# Patient Record
Sex: Female | Born: 1950
Health system: Southern US, Community
[De-identification: ages and names within clinical notes are randomized; demographics above are authoritative.]

## PROBLEM LIST (undated history)

## (undated) DIAGNOSIS — G971 Other reaction to spinal and lumbar puncture: Secondary | ICD-10-CM

## (undated) DIAGNOSIS — A599 Trichomoniasis, unspecified: Secondary | ICD-10-CM

## (undated) DIAGNOSIS — N6320 Unspecified lump in the left breast, unspecified quadrant: Secondary | ICD-10-CM

## (undated) DIAGNOSIS — E079 Disorder of thyroid, unspecified: Secondary | ICD-10-CM

## (undated) DIAGNOSIS — Z87898 Personal history of other specified conditions: Secondary | ICD-10-CM

## (undated) DIAGNOSIS — I77810 Thoracic aortic ectasia: Secondary | ICD-10-CM

## (undated) DIAGNOSIS — Z8619 Personal history of other infectious and parasitic diseases: Secondary | ICD-10-CM

## (undated) DIAGNOSIS — R195 Other fecal abnormalities: Secondary | ICD-10-CM

## (undated) DIAGNOSIS — I319 Disease of pericardium, unspecified: Secondary | ICD-10-CM

## (undated) DIAGNOSIS — D649 Anemia, unspecified: Secondary | ICD-10-CM

## (undated) DIAGNOSIS — M2550 Pain in unspecified joint: Secondary | ICD-10-CM

## (undated) DIAGNOSIS — M48061 Spinal stenosis, lumbar region without neurogenic claudication: Secondary | ICD-10-CM

## (undated) DIAGNOSIS — M1990 Unspecified osteoarthritis, unspecified site: Secondary | ICD-10-CM

## (undated) DIAGNOSIS — K219 Gastro-esophageal reflux disease without esophagitis: Secondary | ICD-10-CM

## (undated) DIAGNOSIS — R42 Dizziness and giddiness: Secondary | ICD-10-CM

## (undated) DIAGNOSIS — J189 Pneumonia, unspecified organism: Secondary | ICD-10-CM

## (undated) DIAGNOSIS — R0602 Shortness of breath: Secondary | ICD-10-CM

## (undated) DIAGNOSIS — K5900 Constipation, unspecified: Secondary | ICD-10-CM

## (undated) DIAGNOSIS — E039 Hypothyroidism, unspecified: Secondary | ICD-10-CM

## (undated) DIAGNOSIS — T8859XA Other complications of anesthesia, initial encounter: Secondary | ICD-10-CM

## (undated) DIAGNOSIS — K649 Unspecified hemorrhoids: Secondary | ICD-10-CM

## (undated) DIAGNOSIS — G8929 Other chronic pain: Secondary | ICD-10-CM

## (undated) DIAGNOSIS — I700 Atherosclerosis of aorta: Secondary | ICD-10-CM

## (undated) DIAGNOSIS — F32A Depression, unspecified: Secondary | ICD-10-CM

## (undated) DIAGNOSIS — T4145XA Adverse effect of unspecified anesthetic, initial encounter: Secondary | ICD-10-CM

## (undated) DIAGNOSIS — Z8249 Family history of ischemic heart disease and other diseases of the circulatory system: Secondary | ICD-10-CM

## (undated) DIAGNOSIS — Z87440 Personal history of urinary (tract) infections: Secondary | ICD-10-CM

## (undated) DIAGNOSIS — J45909 Unspecified asthma, uncomplicated: Secondary | ICD-10-CM

## (undated) DIAGNOSIS — IMO0002 Reserved for concepts with insufficient information to code with codable children: Secondary | ICD-10-CM

## (undated) DIAGNOSIS — N951 Menopausal and female climacteric states: Secondary | ICD-10-CM

## (undated) DIAGNOSIS — K635 Polyp of colon: Secondary | ICD-10-CM

## (undated) DIAGNOSIS — N3941 Urge incontinence: Secondary | ICD-10-CM

## (undated) DIAGNOSIS — M549 Dorsalgia, unspecified: Secondary | ICD-10-CM

## (undated) DIAGNOSIS — K589 Irritable bowel syndrome without diarrhea: Secondary | ICD-10-CM

## (undated) HISTORY — DX: Unspecified lump in the left breast, unspecified quadrant: N63.20

## (undated) HISTORY — PX: DILATION AND CURETTAGE OF UTERUS: SHX78

## (undated) HISTORY — DX: Family history of ischemic heart disease and other diseases of the circulatory system: Z82.49

## (undated) HISTORY — DX: Trichomoniasis, unspecified: A59.9

## (undated) HISTORY — DX: Atherosclerosis of aorta: I70.0

## (undated) HISTORY — PX: FOOT SURGERY: SHX648

## (undated) HISTORY — DX: Constipation, unspecified: K59.00

## (undated) HISTORY — DX: Personal history of urinary (tract) infections: Z87.440

## (undated) HISTORY — DX: Morbid (severe) obesity due to excess calories: E66.01

## (undated) HISTORY — DX: Shortness of breath: R06.02

## (undated) HISTORY — DX: Pain in unspecified joint: M25.50

## (undated) HISTORY — DX: Personal history of other specified conditions: Z87.898

## (undated) HISTORY — PX: KNEE SURGERY: SHX244

## (undated) HISTORY — DX: Depression, unspecified: F32.A

## (undated) HISTORY — DX: Urge incontinence: N39.41

## (undated) HISTORY — DX: Other fecal abnormalities: R19.5

## (undated) HISTORY — PX: APPENDECTOMY: SHX54

## (undated) HISTORY — DX: Menopausal and female climacteric states: N95.1

## (undated) HISTORY — PX: REDUCTION MAMMAPLASTY: SUR839

## (undated) HISTORY — PX: HAND SURGERY: SHX662

## (undated) HISTORY — DX: Disorder of thyroid, unspecified: E07.9

## (undated) HISTORY — PX: BLADDER SUSPENSION: SHX72

## (undated) HISTORY — DX: Dorsalgia, unspecified: M54.9

## (undated) HISTORY — DX: Personal history of other infectious and parasitic diseases: Z86.19

## (undated) HISTORY — PX: TONSILLECTOMY: SUR1361

## (undated) HISTORY — DX: Thoracic aortic ectasia: I77.810

---

## 1990-02-18 HISTORY — PX: ABDOMINAL HYSTERECTOMY: SHX81

## 1990-11-20 HISTORY — PX: HERNIA REPAIR: SHX51

## 1990-11-20 HISTORY — PX: BREAST REDUCTION SURGERY: SHX8

## 1998-03-03 ENCOUNTER — Ambulatory Visit (HOSPITAL_COMMUNITY): Admission: RE | Admit: 1998-03-03 | Discharge: 1998-03-03 | Payer: Self-pay

## 1998-12-23 ENCOUNTER — Other Ambulatory Visit: Admission: RE | Admit: 1998-12-23 | Discharge: 1998-12-23 | Payer: Self-pay | Admitting: Obstetrics and Gynecology

## 1999-01-24 ENCOUNTER — Emergency Department (HOSPITAL_COMMUNITY): Admission: EM | Admit: 1999-01-24 | Discharge: 1999-01-25 | Payer: Self-pay | Admitting: Emergency Medicine

## 1999-04-05 ENCOUNTER — Emergency Department (HOSPITAL_COMMUNITY): Admission: EM | Admit: 1999-04-05 | Discharge: 1999-04-05 | Payer: Self-pay | Admitting: Emergency Medicine

## 1999-04-05 ENCOUNTER — Encounter: Payer: Self-pay | Admitting: Emergency Medicine

## 1999-12-27 ENCOUNTER — Encounter: Payer: Self-pay | Admitting: Family Medicine

## 1999-12-27 ENCOUNTER — Ambulatory Visit (HOSPITAL_COMMUNITY): Admission: RE | Admit: 1999-12-27 | Discharge: 1999-12-27 | Payer: Self-pay | Admitting: Family Medicine

## 2000-02-22 ENCOUNTER — Emergency Department (HOSPITAL_COMMUNITY): Admission: EM | Admit: 2000-02-22 | Discharge: 2000-02-22 | Payer: Self-pay | Admitting: Emergency Medicine

## 2000-02-22 ENCOUNTER — Encounter: Payer: Self-pay | Admitting: Emergency Medicine

## 2001-03-22 ENCOUNTER — Emergency Department (HOSPITAL_COMMUNITY): Admission: EM | Admit: 2001-03-22 | Discharge: 2001-03-23 | Payer: Self-pay | Admitting: *Deleted

## 2001-07-17 ENCOUNTER — Ambulatory Visit (HOSPITAL_COMMUNITY): Admission: RE | Admit: 2001-07-17 | Discharge: 2001-07-17 | Payer: Self-pay | Admitting: Specialist

## 2001-11-20 DIAGNOSIS — N6320 Unspecified lump in the left breast, unspecified quadrant: Secondary | ICD-10-CM

## 2001-11-20 DIAGNOSIS — N951 Menopausal and female climacteric states: Secondary | ICD-10-CM

## 2001-11-20 DIAGNOSIS — N63 Unspecified lump in unspecified breast: Secondary | ICD-10-CM | POA: Insufficient documentation

## 2001-11-20 HISTORY — PX: BREAST LUMPECTOMY: SHX2

## 2001-11-20 HISTORY — PX: INTERSTIM IMPLANT PLACEMENT: SHX5130

## 2001-11-20 HISTORY — DX: Unspecified lump in the left breast, unspecified quadrant: N63.20

## 2001-11-20 HISTORY — DX: Menopausal and female climacteric states: N95.1

## 2002-05-15 ENCOUNTER — Other Ambulatory Visit: Admission: RE | Admit: 2002-05-15 | Discharge: 2002-05-15 | Payer: Self-pay | Admitting: Obstetrics and Gynecology

## 2003-05-07 ENCOUNTER — Other Ambulatory Visit: Admission: RE | Admit: 2003-05-07 | Discharge: 2003-05-07 | Payer: Self-pay | Admitting: Obstetrics and Gynecology

## 2003-11-21 DIAGNOSIS — N3941 Urge incontinence: Secondary | ICD-10-CM

## 2003-11-21 HISTORY — DX: Urge incontinence: N39.41

## 2003-11-26 ENCOUNTER — Inpatient Hospital Stay (HOSPITAL_COMMUNITY): Admission: EM | Admit: 2003-11-26 | Discharge: 2003-11-28 | Payer: Self-pay | Admitting: Emergency Medicine

## 2003-11-27 ENCOUNTER — Encounter (INDEPENDENT_AMBULATORY_CARE_PROVIDER_SITE_OTHER): Payer: Self-pay | Admitting: Cardiology

## 2004-11-20 HISTORY — DX: Morbid (severe) obesity due to excess calories: E66.01

## 2005-03-13 ENCOUNTER — Emergency Department (HOSPITAL_COMMUNITY): Admission: EM | Admit: 2005-03-13 | Discharge: 2005-03-14 | Payer: Self-pay | Admitting: Emergency Medicine

## 2005-07-05 ENCOUNTER — Other Ambulatory Visit: Admission: RE | Admit: 2005-07-05 | Discharge: 2005-07-05 | Payer: Self-pay | Admitting: Obstetrics and Gynecology

## 2005-07-20 ENCOUNTER — Ambulatory Visit (HOSPITAL_COMMUNITY): Admission: RE | Admit: 2005-07-20 | Discharge: 2005-07-20 | Payer: Self-pay | Admitting: Obstetrics and Gynecology

## 2005-09-02 ENCOUNTER — Ambulatory Visit: Payer: Self-pay | Admitting: Sports Medicine

## 2005-09-02 ENCOUNTER — Observation Stay (HOSPITAL_COMMUNITY): Admission: EM | Admit: 2005-09-02 | Discharge: 2005-09-02 | Payer: Self-pay | Admitting: Emergency Medicine

## 2006-01-15 ENCOUNTER — Emergency Department (HOSPITAL_COMMUNITY): Admission: EM | Admit: 2006-01-15 | Discharge: 2006-01-16 | Payer: Self-pay | Admitting: Emergency Medicine

## 2006-11-20 HISTORY — PX: ROTATOR CUFF REPAIR: SHX139

## 2007-01-26 ENCOUNTER — Emergency Department (HOSPITAL_COMMUNITY): Admission: EM | Admit: 2007-01-26 | Discharge: 2007-01-26 | Payer: Self-pay | Admitting: Emergency Medicine

## 2007-04-30 ENCOUNTER — Emergency Department (HOSPITAL_COMMUNITY): Admission: EM | Admit: 2007-04-30 | Discharge: 2007-04-30 | Payer: Self-pay | Admitting: Emergency Medicine

## 2008-01-10 ENCOUNTER — Ambulatory Visit (HOSPITAL_COMMUNITY): Admission: RE | Admit: 2008-01-10 | Discharge: 2008-01-10 | Payer: Self-pay | Admitting: Specialist

## 2008-07-04 ENCOUNTER — Emergency Department (HOSPITAL_COMMUNITY): Admission: EM | Admit: 2008-07-04 | Discharge: 2008-07-04 | Payer: Self-pay | Admitting: Emergency Medicine

## 2008-11-20 HISTORY — PX: CHOLECYSTECTOMY: SHX55

## 2009-03-29 ENCOUNTER — Emergency Department (HOSPITAL_COMMUNITY): Admission: EM | Admit: 2009-03-29 | Discharge: 2009-03-29 | Payer: Self-pay | Admitting: Emergency Medicine

## 2009-05-26 ENCOUNTER — Encounter: Admission: RE | Admit: 2009-05-26 | Discharge: 2009-05-26 | Payer: Self-pay | Admitting: Orthopaedic Surgery

## 2009-08-05 ENCOUNTER — Emergency Department (HOSPITAL_COMMUNITY): Admission: EM | Admit: 2009-08-05 | Discharge: 2009-08-06 | Payer: Self-pay | Admitting: Emergency Medicine

## 2009-10-17 ENCOUNTER — Emergency Department (HOSPITAL_COMMUNITY): Admission: EM | Admit: 2009-10-17 | Discharge: 2009-10-17 | Payer: Self-pay | Admitting: Emergency Medicine

## 2009-11-08 ENCOUNTER — Emergency Department (HOSPITAL_COMMUNITY): Admission: EM | Admit: 2009-11-08 | Discharge: 2009-11-08 | Payer: Self-pay | Admitting: Family Medicine

## 2010-03-16 ENCOUNTER — Inpatient Hospital Stay (HOSPITAL_COMMUNITY): Admission: EM | Admit: 2010-03-16 | Discharge: 2010-03-18 | Payer: Self-pay | Admitting: Emergency Medicine

## 2010-05-24 ENCOUNTER — Encounter: Admission: RE | Admit: 2010-05-24 | Discharge: 2010-05-24 | Payer: Self-pay | Admitting: Obstetrics and Gynecology

## 2010-12-15 ENCOUNTER — Encounter
Admission: RE | Admit: 2010-12-15 | Discharge: 2010-12-15 | Payer: Self-pay | Source: Home / Self Care | Attending: Obstetrics and Gynecology | Admitting: Obstetrics and Gynecology

## 2011-01-09 ENCOUNTER — Encounter: Payer: BC Managed Care – PPO | Attending: Endocrinology | Admitting: *Deleted

## 2011-01-09 DIAGNOSIS — E119 Type 2 diabetes mellitus without complications: Secondary | ICD-10-CM | POA: Insufficient documentation

## 2011-01-09 DIAGNOSIS — Z713 Dietary counseling and surveillance: Secondary | ICD-10-CM | POA: Insufficient documentation

## 2011-01-27 ENCOUNTER — Emergency Department (HOSPITAL_COMMUNITY)
Admission: EM | Admit: 2011-01-27 | Discharge: 2011-01-27 | Disposition: A | Discharge status: Home / Self Care | Payer: BC Managed Care – PPO | Attending: Emergency Medicine | Admitting: Emergency Medicine

## 2011-01-27 ENCOUNTER — Emergency Department (HOSPITAL_COMMUNITY): Payer: BC Managed Care – PPO

## 2011-01-27 DIAGNOSIS — M545 Low back pain, unspecified: Secondary | ICD-10-CM | POA: Insufficient documentation

## 2011-01-27 DIAGNOSIS — W010XXA Fall on same level from slipping, tripping and stumbling without subsequent striking against object, initial encounter: Secondary | ICD-10-CM | POA: Insufficient documentation

## 2011-01-27 DIAGNOSIS — M549 Dorsalgia, unspecified: Secondary | ICD-10-CM | POA: Insufficient documentation

## 2011-02-07 ENCOUNTER — Encounter: Payer: BC Managed Care – PPO | Attending: Endocrinology | Admitting: *Deleted

## 2011-02-07 DIAGNOSIS — Z713 Dietary counseling and surveillance: Secondary | ICD-10-CM | POA: Insufficient documentation

## 2011-02-07 DIAGNOSIS — E119 Type 2 diabetes mellitus without complications: Secondary | ICD-10-CM | POA: Insufficient documentation

## 2011-02-07 LAB — URINALYSIS, MICROSCOPIC ONLY
Hgb urine dipstick: NEGATIVE
Leukocytes, UA: NEGATIVE
Nitrite: NEGATIVE
Protein, ur: NEGATIVE mg/dL
Specific Gravity, Urine: 1.027 (ref 1.005–1.030)
Urobilinogen, UA: 0.2 mg/dL (ref 0.0–1.0)

## 2011-02-07 LAB — CULTURE, BLOOD (ROUTINE X 2)
Culture: NO GROWTH
Culture: NO GROWTH

## 2011-02-07 LAB — COMPREHENSIVE METABOLIC PANEL
ALT: 38 U/L — ABNORMAL HIGH (ref 0–35)
AST: 24 U/L (ref 0–37)
Albumin: 3 g/dL — ABNORMAL LOW (ref 3.5–5.2)
Alkaline Phosphatase: 93 U/L (ref 39–117)
BUN: 15 mg/dL (ref 6–23)
CO2: 29 mEq/L (ref 19–32)
Calcium: 8.1 mg/dL — ABNORMAL LOW (ref 8.4–10.5)
Chloride: 106 mEq/L (ref 96–112)
Creatinine, Ser: 0.68 mg/dL (ref 0.4–1.2)
GFR calc Af Amer: >60 mL/min (ref >60)
GFR calc non Af Amer: >60 mL/min (ref >60)
Glucose, Bld: 108 mg/dL — ABNORMAL HIGH (ref 70–99)
Potassium: 3.9 mEq/L (ref 3.5–5.1)
Sodium: 138 mEq/L (ref 135–145)
Total Bilirubin: 0.6 mg/dL (ref 0.3–1.2)
Total Protein: 6 g/dL (ref 6.0–8.3)

## 2011-02-07 LAB — URINE CULTURE: Colony Count: 40000

## 2011-02-07 LAB — BASIC METABOLIC PANEL
BUN: 12 mg/dL (ref 6–23)
BUN: 16 mg/dL (ref 6–23)
CO2: 27 mEq/L (ref 19–32)
CO2: 28 mEq/L (ref 19–32)
Calcium: 8.4 mg/dL (ref 8.4–10.5)
Calcium: 8.9 mg/dL (ref 8.4–10.5)
Chloride: 104 mEq/L (ref 96–112)
Chloride: 105 mEq/L (ref 96–112)
Creatinine, Ser: 0.63 mg/dL (ref 0.4–1.2)
Creatinine, Ser: 0.78 mg/dL (ref 0.4–1.2)
GFR calc Af Amer: >60 mL/min (ref >60)
GFR calc non Af Amer: >60 mL/min (ref >60)
Glucose, Bld: 112 mg/dL — ABNORMAL HIGH (ref 70–99)
Glucose, Bld: 131 mg/dL — ABNORMAL HIGH (ref 70–99)
Potassium: 3.5 mEq/L (ref 3.5–5.1)
Sodium: 140 mEq/L (ref 135–145)

## 2011-02-07 LAB — LIPID PANEL
HDL: 41 mg/dL (ref >39)
LDL Cholesterol: 78 mg/dL (ref 0–99)
Triglycerides: 144 mg/dL (ref <150)
VLDL: 29 mg/dL (ref 0–40)

## 2011-02-07 LAB — POCT CARDIAC MARKERS
CKMB, poc: 1.7 ng/mL (ref 1.0–8.0)
Myoglobin, poc: 65.6 ng/mL (ref 12–200)
Troponin i, poc: <0.05 ng/mL (ref 0.00–0.09)

## 2011-02-07 LAB — CBC
HCT: 33.7 % — ABNORMAL LOW (ref 36.0–46.0)
HCT: 38.5 % (ref 36.0–46.0)
Hemoglobin: 11.3 g/dL — ABNORMAL LOW (ref 12.0–15.0)
Hemoglobin: 13 g/dL (ref 12.0–15.0)
MCHC: 33.4 g/dL (ref 30.0–36.0)
MCHC: 33.8 g/dL (ref 30.0–36.0)
MCV: 85.8 fL (ref 78.0–100.0)
MCV: 86.7 fL (ref 78.0–100.0)
Platelets: 200 10*3/uL (ref 150–400)
Platelets: 261 10*3/uL (ref 150–400)
RBC: 3.89 MIL/uL (ref 3.87–5.11)
RBC: 4.48 MIL/uL (ref 3.87–5.11)
RDW: 14.9 % (ref 11.5–15.5)
RDW: 15 % (ref 11.5–15.5)
WBC: 11.7 10*3/uL — ABNORMAL HIGH (ref 4.0–10.5)
WBC: 9.1 10*3/uL (ref 4.0–10.5)

## 2011-02-07 LAB — SEDIMENTATION RATE: Sed Rate: 33 mm/hr — ABNORMAL HIGH (ref 0–22)

## 2011-02-07 LAB — CK TOTAL AND CKMB (NOT AT ARMC)
CK, MB: 1.4 ng/mL (ref 0.3–4.0)
Relative Index: INVALID (ref 0.0–2.5)
Total CK: 50 U/L (ref 7–177)

## 2011-02-07 LAB — D-DIMER, QUANTITATIVE: D-Dimer, Quant: 0.58 ug/mL-FEU — ABNORMAL HIGH (ref 0.00–0.48)

## 2011-02-07 LAB — CARDIAC PANEL(CRET KIN+CKTOT+MB+TROPI)
CK, MB: 1.1 ng/mL (ref 0.3–4.0)
Relative Index: INVALID (ref 0.0–2.5)
Total CK: 39 U/L (ref 7–177)
Total CK: 45 U/L (ref 7–177)
Troponin I: 0.02 ng/mL (ref 0.00–0.06)

## 2011-02-07 LAB — TROPONIN I: Troponin I: <0.01 ng/mL (ref 0.00–0.06)

## 2011-02-07 LAB — TSH: TSH: 1.698 u[IU]/mL (ref 0.350–4.500)

## 2011-02-07 LAB — HEMOGLOBIN A1C
Hgb A1c MFr Bld: 6.1 % — ABNORMAL HIGH (ref <5.7)
Mean Plasma Glucose: 128 mg/dL — ABNORMAL HIGH (ref <117)

## 2011-03-14 ENCOUNTER — Encounter: Payer: BC Managed Care – PPO | Attending: Endocrinology | Admitting: *Deleted

## 2011-03-14 DIAGNOSIS — Z713 Dietary counseling and surveillance: Secondary | ICD-10-CM | POA: Insufficient documentation

## 2011-03-14 DIAGNOSIS — E119 Type 2 diabetes mellitus without complications: Secondary | ICD-10-CM | POA: Insufficient documentation

## 2011-04-07 NOTE — H&P (Signed)
NAMEMarland Kitchen  Kelly Taylor, Kelly Taylor                 ACCOUNT NO.:  [id number]   MEDICAL RECORD NO.:  [id number]          PATIENT TYPE:  INP   LOCATION:  2031                         FACILITY:  [facility]   PHYSICIAN:  Kelly Taylor, Kelly TaylorDATE OF BIRTH:  Mar 17, 1951   DATE OF ADMISSION:  09/01/2005  DATE OF DISCHARGE:                                HISTORY & PHYSICAL   CHIEF COMPLAINT:  Chest pain.   HISTORY OF PRESENT ILLNESS:  The patient is a 60 year old female  ____________ substernal chest pain.  The patient states the pain started  shortly after dinner the evening prior to admission.  Dinner consisted of  pizza and ___________.  She had a sharp substernal chest pain that radiated  to the left arm.  The chest pain was associated with diaphoresis and  shortness of breath.  The pain is rated as a 7 on a scale of 1 to 10.  ___________.  The patient denies any ___________ no GI or urinary  complaints.  The patient does have hot flashes frequently, ___________.   PAST MEDICAL HISTORY:  ___________ stress incontinence.  No history of  ___________.   ALLERGIES:  ____________.   MEDICATIONS:  1.  ___________ p.o. every day.  2.  Imipramine ___________ b.i.d.  3.  Enjubia 0.625 __________ every day.   FAMILY HISTORY:  Mother hypercholesterolemia, CABG, lung cancer, __________.  Father suicide.   SOCIAL HISTORY:  Married, children.  Employed at [employer], part time.  Usually  drinks on average one alcoholic beverage every three months.  No tobacco  history or drug use.  Patient ___________.   PHYSICAL EXAMINATION:  VITAL SIGNS:  Height __________, pulse 104,  respiratory rate 20, blood pressure 136/90, O2 saturation __________.  GENERAL:  ___________.  HEENT:  Atraumatic, normocephalic.  PERRL.  EOMI.  TMs clear.  Pharynx  __________.  NECK:  Full range of motion without stiffness.  No thyromegaly.  No  lymphadenopathy.  CARDIOVASCULAR:  Regular rate and rhythm with no gallops or rubs or JVD.  LUNGS:   Clear to auscultation bilaterally.  ABDOMEN:  Obese, soft, nontender, nondistended.  No hepatosplenomegaly or  masses.  Positive bowel sounds.  EXTREMITIES:  Trace lower extremity edema bilaterally.  No erythema.  Muscle  strength 5/5 bilaterally upper and lower extremities.  NEUROLOGIC:  Cranial nerves II-XII are intact.   LABORATORY:  WBC 11.0, hemoglobin 12.3, hematocrit 37.3, platelets 280.  Sodium 142, potassium 3.2, chloride 105, bicarb 25, BUN 12, creatinine 0.7.  AST 10, ALT 10, alk phos 75, bilirubin 0.3.  ___________.  Cardiac enzymes  at 2359, CK-MB less than 1.0, troponin I less than 0.05, myoglobin 46.5.  See cardiac enzymes at 0111, CK-MB 1.2, troponin I 0.05, myoglobin 58.9.   Chest x-ray:  No acute findings.   ASSESSMENT/PLAN:  A 60 year old obese female with acute chest pain.   1.  Chest pain.  Other than obesity, the patient has low risk factors for      cardiac disease __________.  No abnormalities noted on EKG.  For now, we      will continue supportive treatment and  monitor for recurrence of pain.      The patient continues to be pain free after nitroglycerin drip has been      discontinued.  Etiology of the chest pain may be secondary to      gastroesophageal reflux versus esophageal spasm.  Start Protonix every      day.  Pulmonary embolus very unlikely given normal CT of abdomen, chest      x-ray not significant for pneumonia or edema.  We will order a lipid      panel to help with risk stratification as well as TSH to rule out      thyroid disease as an etiology of chest pain.  2.  Musculoskeletal.  May continue p.r.n. medications.  The patient only      taking once daily dose, however, will consider discharging patient on a      ___________ block or proton pump inhibitor to prevent insult to      gastrointestinal.  3.  Fluids, electrolytes, nutrition gastrointestinal.  Patient well      hydrated.  Hep-Lock intravenous fluids, start regular diet, replace       potassium orally, monitor in and out.   DISPOSITION:  Pending cardiac panel negative x third set.  Vital signs  stable as well as chest pain continues to be resolved.  The patient is to  return to urgent care for hospital followup within the next one week.  We  will notify them of this admission.      Morley Kos, M.D.    ______________________________  Kelly Bumpers. Leveda Anna, M.D.    VRE/MEDQ  D:  09/02/2005  T:  09/02/2005  Job:  [id number]   cc:   [facility]

## 2011-04-07 NOTE — Discharge Summary (Signed)
Kelly Taylor, Kelly Taylor                 ACCOUNT NO.:  [id number]   MEDICAL RECORD NO.:  [id number]          PATIENT TYPE:  INP   LOCATION:  2031                         FACILITY:  [facility]   PHYSICIAN:  Franchot Mimes, MD      DATE OF BIRTH:  1951-09-14   DATE OF ADMISSION:  09/01/2005  DATE OF DISCHARGE:  09/02/2005                                 DISCHARGE SUMMARY   DISCHARGE DIAGNOSES:  1.  Chest pain without acute myocardial infarction.  2.  Likely gastritis.  3.  Glucose intolerance.   DISCHARGE MEDICATIONS:  1.  __________ 0.625 mg daily.  2.  Imipramine 25 mg twice daily.  3.  Mobic 15 mg daily.  4.  Zantac or Prilosec OTC as needed.   DISCHARGE INSTRUCTIONS:  1.  The patient was instructed to contact [facility] for followup      appointment within two weeks after discharge for follow-up and possible      workup of her chest pain.  2.  The patient was instructed if her pain persisted or returned then she      should contact her doctor or return to the emergency room.  3.  The patient was instructed to limit her use of Mobic to avoid stomach      and esophageal irritation.   LABORATORY DATA:  CBC -- white blood cell count 11, hemoglobin 12.3,  hematocrit 37.3, platelets 280.  CMET -- sodium 137, potassium 4.1, chloride  105, bicarb 24, BUN 12, creatinine 0.8.   A fasting plasma glucose was 124, total bilirubin 0.3, alkaline phosphatase  74, AST 19, ALT 14, total protein 6.5, albumin 3.4, calcium 9.0.  Cardiac  enzymes were negative x3 sets.   Fasting lipid profile -- total cholesterol 165, triglycerides 159, HDL 52,  LDL 81.   A urinalysis was normal.  Alcohol and lipase were also within normal limits   CONSULTS:  None.   PROCEDURES:  1.  An EKG was normal except for some low voltage QRS.  2.  Chest x-ray was normal.  3.  A chest CT was performed which revealed small gallstones with no      evidence of cholecystitis.   HISTORY OF PRESENT ILLNESS:  The  patient is a 60 year old postmenopausal  obese female who says that she was having pizza as well as alcohol and began  having substernal chest pain radiating to her left.  The pain was not  relieved with nitroglycerin.  The patient has not been having chest pain  prior.  She does occasionally have hot flashes, she started hormone therapy  two months ago.   PHYSICAL EXAMINATION:  VITAL SIGNS:  Temperature 98.5, heart rate 104, blood  pressure 136/90, respirations 20, oxygen saturation 95% on room air.  GENERAL:  She is awake, alert, oriented x3 in no distress.  HEENT:  Exam was normal.  CARDIOVASCULAR:  Exam normal.  LUNGS:  Exam norma.  ABDOMEN:  Exam showed no masses and no tenderness.   HOSPITAL COURSE:  1.  Chest pain:  The pain eventually relieved overnight, however, it  was not      associated with any particular treatment or intervention.  She was      placed on morphine for pain and this may have helped the pain subsided,      however, she was pain free without receiving morphine well into the next      day after admission.  She was also given Protonix as well.  She was also      started on Lopressor and aspirin for her chest pain.  It was felt given      that she had minimal risk factors consisting primarily of a family      history with her mom diagnosed in her 45s, and that she was recently      started on hormone replacement therapy, and that she is obese, that the      patient could be worked up as an outpatient if she continues to have      symptoms concerning for coronary artery disease.  2.  Likely gastritis.  Given that she takes Mobic and apparently drinks      alcohol, it is possible that this pain was also related to gastritis or      esophageal spasm.  She can use Prilosec OTC or Zantac for these      symptoms, if the symptoms continue to persist then a GI workup may be      indicated.  3.  Gallstones without cholecystitis.   SUGGESTED FOLLOWUP ITEMS:  1.  For  the chest pain, further workup may be indicated as an outpatient.  2.  Given her gallstones, if this becomes an issue or she becomes      symptomatic from these, a cholecystectomy may be an option.  3.  Risks and benefits of continued hormone replacement therapy should be      discussed if she is found to be at significant risk for coronary artery      disease.  4.  Risks and benefits of continued Mobic or nonsteroidal anti-inflammatory      use should be discussed given possibility of gastritis.      Franchot Mimes, MD     TV/MEDQ  D:  09/02/2005  T:  09/02/2005  Job:  [id number]   cc:   [facility]

## 2011-04-07 NOTE — Discharge Summary (Signed)
NAMEMarland Kitchen  Kelly Taylor, Kelly Taylor [facility]                     ACCOUNT NO.:  [id number]   MEDICAL RECORD NO.:  [id number]                   PATIENT TYPE:  INP   LOCATION:  4743                                 FACILITY:  [facility]   PHYSICIAN:  Jenne Campus, Dr.                        DATE OF BIRTH:  December 02, 1950   DATE OF ADMISSION:  11/26/2003  DATE OF DISCHARGE:  11/28/2003                                 DISCHARGE SUMMARY   DISCHARGE DIAGNOSES:  1. Chest pain, myocardial infarction ruled out.  2. Family history of coronary disease.   HOSPITAL COURSE:  The patient is a 60 year old female with a strong family  history of coronary disease.  Her mother had bypass surgery at 33.  She was  admitted through the emergency room at 8 p.m. on the 6th with chest  pressure, consistent with unstable angina.  She described a chest pressure  with left arm pain, neck and jaw pain.  She also said she was short of  breath, and had nausea, and emesis.  She was brought to the ER by EMS.  The  symptoms were improved after Pepcid, Benadryl, morphine and nitroglycerin.   PAST MEDICAL HISTORY:  Unremarkable except arthritis.   MEDICATIONS:  She takes no medications at home.   ALLERGIES:  Has no allergies.   FAMILY HISTORY:  She does have a family history of coronary disease as  noted.  She is a nonsmoker.   She was admitted to telemetry.  CT was obtained which was negative for  pulmonary embolism.  She did have a slightly elevated temperature to 99.1 on  the 7th.  Blood cultures were obtained and the patient was started on  Avelox.  An echocardiogram was ordered and the results are pending at the  time of this dictation but can be followed up as an outpatient.  She has  apparently been seen in our office in the past and had a Cardiolite study in  December 2003 that was negative for ischemia.  We feel she can be discharged  on November 28, 2003.  Dr. Aleen Campi suggests we get a followup calcium level,  as her calcium was 7.6.   She will also have a phosphorous level checked.  We  will need to followup her echocardiogram and blood cultures as well,  although she is afebrile at discharge.  She will be sent home on a few days  of antibiotics and a nonsteroidal on a p.r.n. basis.   DISPOSITION:  The patient is discharged in stable condition and will follow  up with our office in about two weeks.      Abelino Derrick, P.Rayburn Ma, Dr.    Lenard Lance  D:  11/28/2003  T:  11/28/2003  Job:  [id number]

## 2011-04-21 ENCOUNTER — Emergency Department (HOSPITAL_COMMUNITY)
Admission: EM | Admit: 2011-04-21 | Discharge: 2011-04-21 | Disposition: A | Discharge status: Home / Self Care | Payer: BC Managed Care – PPO | Attending: Emergency Medicine | Admitting: Emergency Medicine

## 2011-04-21 DIAGNOSIS — R197 Diarrhea, unspecified: Secondary | ICD-10-CM | POA: Insufficient documentation

## 2011-04-21 DIAGNOSIS — E119 Type 2 diabetes mellitus without complications: Secondary | ICD-10-CM | POA: Insufficient documentation

## 2011-04-21 DIAGNOSIS — R42 Dizziness and giddiness: Secondary | ICD-10-CM | POA: Insufficient documentation

## 2011-04-21 DIAGNOSIS — H538 Other visual disturbances: Secondary | ICD-10-CM | POA: Insufficient documentation

## 2011-05-17 ENCOUNTER — Other Ambulatory Visit: Payer: Self-pay | Admitting: Obstetrics and Gynecology

## 2011-05-17 DIAGNOSIS — N63 Unspecified lump in unspecified breast: Secondary | ICD-10-CM

## 2011-05-26 ENCOUNTER — Ambulatory Visit
Admission: RE | Admit: 2011-05-26 | Discharge: 2011-05-26 | Disposition: A | Discharge status: Home / Self Care | Payer: BC Managed Care – PPO | Source: Ambulatory Visit | Attending: Obstetrics and Gynecology | Admitting: Obstetrics and Gynecology

## 2011-05-26 DIAGNOSIS — N63 Unspecified lump in unspecified breast: Secondary | ICD-10-CM

## 2011-09-07 LAB — POCT CARDIAC MARKERS
CKMB, poc: <1 — ABNORMAL LOW
Myoglobin, poc: 38.1
Operator id: 277751
Troponin i, poc: <0.05
Troponin i, poc: <0.05

## 2011-09-07 LAB — I-STAT 8, (EC8 V) (CONVERTED LAB)
Bicarbonate: 23.5
Chloride: 109
HCT: 36
Hemoglobin: 12.2
Operator id: 277751
Sodium: 139
pCO2, Ven: 35.4 — ABNORMAL LOW

## 2011-09-07 LAB — POCT I-STAT CREATININE: Creatinine, Ser: 0.6

## 2011-12-22 ENCOUNTER — Encounter (HOSPITAL_COMMUNITY): Payer: Self-pay | Admitting: Emergency Medicine

## 2011-12-22 ENCOUNTER — Emergency Department (HOSPITAL_COMMUNITY)
Admission: EM | Admit: 2011-12-22 | Discharge: 2011-12-23 | Disposition: A | Discharge status: Home / Self Care | Payer: BC Managed Care – PPO | Attending: Emergency Medicine | Admitting: Emergency Medicine

## 2011-12-22 ENCOUNTER — Emergency Department (HOSPITAL_COMMUNITY): Payer: BC Managed Care – PPO

## 2011-12-22 DIAGNOSIS — Z79899 Other long term (current) drug therapy: Secondary | ICD-10-CM | POA: Insufficient documentation

## 2011-12-22 DIAGNOSIS — X500XXA Overexertion from strenuous movement or load, initial encounter: Secondary | ICD-10-CM | POA: Insufficient documentation

## 2011-12-22 DIAGNOSIS — M25519 Pain in unspecified shoulder: Secondary | ICD-10-CM | POA: Insufficient documentation

## 2011-12-22 DIAGNOSIS — E119 Type 2 diabetes mellitus without complications: Secondary | ICD-10-CM | POA: Insufficient documentation

## 2011-12-22 DIAGNOSIS — M129 Arthropathy, unspecified: Secondary | ICD-10-CM | POA: Insufficient documentation

## 2011-12-22 DIAGNOSIS — S46909A Unspecified injury of unspecified muscle, fascia and tendon at shoulder and upper arm level, unspecified arm, initial encounter: Secondary | ICD-10-CM | POA: Insufficient documentation

## 2011-12-22 DIAGNOSIS — Z9889 Other specified postprocedural states: Secondary | ICD-10-CM | POA: Insufficient documentation

## 2011-12-22 DIAGNOSIS — S4992XA Unspecified injury of left shoulder and upper arm, initial encounter: Secondary | ICD-10-CM

## 2011-12-22 DIAGNOSIS — M79609 Pain in unspecified limb: Secondary | ICD-10-CM | POA: Insufficient documentation

## 2011-12-22 DIAGNOSIS — S4980XA Other specified injuries of shoulder and upper arm, unspecified arm, initial encounter: Secondary | ICD-10-CM | POA: Insufficient documentation

## 2011-12-22 DIAGNOSIS — Y990 Civilian activity done for income or pay: Secondary | ICD-10-CM | POA: Insufficient documentation

## 2011-12-22 HISTORY — DX: Unspecified osteoarthritis, unspecified site: M19.90

## 2011-12-22 MED ORDER — OXYCODONE-ACETAMINOPHEN 5-325 MG PO TABS
2.0000 | ORAL_TABLET | Freq: Once | ORAL | Status: AC
  Administered 2011-12-22: 2 via ORAL
  Filled 2011-12-22: qty 2

## 2011-12-22 NOTE — ED Notes (Signed)
Pt st's she fell at work tonight and put her left arm down to break her fall.  Pt c/o pain to left upper arm.

## 2011-12-22 NOTE — ED Notes (Signed)
Pt & stretcher not in room, in xray.

## 2011-12-22 NOTE — ED Notes (Signed)
Back from xray, alert, NAD, calm, interactive, guarding movements, ice pack resting on L anterior shoulder, CMS intact bilaterally, bilateral radial pulses (strong) & cap refill (<3sec) equal. Reports some n/t in L arm, states, "cannot have MRI d/t medical implant".

## 2011-12-23 NOTE — ED Provider Notes (Signed)
History     CSN: [id number]  Arrival date & time 12/22/11  2104   First MD Initiated Contact with Patient 12/22/11 2300      Chief Complaint  Patient presents with  . Arm Injury    (Consider location/radiation/quality/duration/timing/severity/associated sxs/prior treatment) HPI Comments: Patient tripped at work, catching herself with her outstretched left arm.  Now having pain in the left shoulder joint, radiating to mid humerus area.  Full range of motion of wrist and elbow.  Limited range of motion of shoulder.  No deformities, bruising, or breaks in the skin  Patient is a 61 y.o. female presenting with arm injury. The history is provided by the patient.  Arm Injury  The incident occurred just prior to arrival. The injury mechanism was a fall. There is an injury to the left shoulder. The pain is moderate. Pertinent negatives include no weakness.    Past Medical History  Diagnosis Date  . Diabetes mellitus   . Arthritis     Past Surgical History  Procedure Date  . Appendectomy   . Cholecystectomy   . Hernia repair   . Knee surgery   . Rotator cuff repair   . Breast reduction surgery   . Abdominal hysterectomy     No family history on file.  History  Substance Use Topics  . Smoking status: Never Smoker   . Smokeless tobacco: Not on file  . Alcohol Use: No    OB History    Grav Para Term Preterm Abortions TAB SAB Ect Mult Living                  Review of Systems  Musculoskeletal: Negative for back pain and joint swelling.  Skin: Negative for color change and wound.  Neurological: Negative for dizziness and weakness.    Allergies  Penicillins and Sulfa antibiotics  Home Medications   Current Outpatient Rx  Name Route Sig Dispense Refill  . FLUTICASONE PROPIONATE 50 MCG/ACT NA SUSP Nasal Place 2 sprays into the nose daily as needed. For nasal congestion    . HYDROCODONE-ACETAMINOPHEN 5-325 MG PO TABS Oral Take 1 tablet by mouth every 6 (six) hours as  needed. For pain    . IMIPRAMINE HCL 50 MG PO TABS Oral Take 50 mg by mouth at bedtime.    Marland Kitchen LEVOTHYROXINE SODIUM 25 MCG PO TABS Oral Take 25 mcg by mouth daily.    . MELOXICAM 15 MG PO TABS Oral Take 15 mg by mouth daily.    Marland Kitchen METFORMIN HCL 500 MG PO TABS Oral Take 500 mg by mouth 2 (two) times daily with a meal.    . METHOCARBAMOL 500 MG PO TABS Oral Take 500 mg by mouth 3 (three) times daily as needed. For muscle spasms    . SOLIFENACIN SUCCINATE 10 MG PO TABS Oral Take 5 mg by mouth daily.    . TRAMADOL HCL 50 MG PO TABS Oral Take 50-100 mg by mouth every 6 (six) hours as needed. For pain      BP 124/77  Pulse 103  Temp(Src) 98.9 F (37.2 C) (Oral)  Resp 18  SpO2 95%  Physical Exam  Constitutional: She is oriented to person, place, and time. She appears well-developed and well-nourished.  HENT:  Head: Normocephalic.  Neck: Normal range of motion.  Cardiovascular: Normal rate.   Musculoskeletal:       Left upper arm: She exhibits tenderness. She exhibits no swelling, no edema, no deformity and no laceration.  Neurological: She  is alert and oriented to person, place, and time.  Skin: Skin is warm and dry.    ED Course  Procedures (including critical care time)  Labs Reviewed - No data to display Dg Humerus Left  12/22/2011  *RADIOLOGY REPORT*  Clinical Data: Left upper arm pain secondary to a fall tonight.  LEFT HUMERUS - 2+ VIEW  Comparison: None.  Findings: The patient has fairly severe osteoarthritis of the glenohumeral joint with spurring of the humeral head and glenoid. There is narrowing of the space between the acromion and humeral head suggesting possible chronic rotator cuff tear.  Fairly severe arthritic changes of the acromioclavicular joint.  No acute osseous abnormality.  IMPRESSION: No acute abnormality.  Moderately severe arthritic changes of the left shoulder joint.  Original Report Authenticated By: Gwynn Burly, M.D.     1. Injury of left shoulder      No fracture noted on x-ray only arthritic, changes.  We'll treat with nonsteroidals and sling were 2-3 days.  The patient.  Follow up with her primary care physician  MDM  Limited range of motion at the shoulder on the left arm.  Full range of motion of fingers wrist, elbow, positive distal pulses.  Good cap refill distally.  No durations lacerations, bruising to the shoulder, will x-ray, shoulder, and numerous to assess for possible fracture I verified with patient that she does indeed have meloxicam, Ultram, and Vicodin at home       Arman Filter, NP 12/23/11 0029  Arman Filter, NP 12/23/11 [phone]

## 2011-12-23 NOTE — ED Provider Notes (Signed)
Medical screening examination/treatment/procedure(s) were performed by non-physician practitioner and as supervising physician I was immediately available for consultation/collaboration.  Ethelda Chick, MD 12/23/11 [phone]

## 2012-05-07 ENCOUNTER — Other Ambulatory Visit: Payer: Self-pay | Admitting: Rehabilitation

## 2012-05-07 DIAGNOSIS — M545 Low back pain: Secondary | ICD-10-CM

## 2012-05-09 ENCOUNTER — Ambulatory Visit
Admission: RE | Admit: 2012-05-09 | Discharge: 2012-05-09 | Disposition: A | Discharge status: Home / Self Care | Payer: BC Managed Care – PPO | Source: Ambulatory Visit | Attending: Rehabilitation | Admitting: Rehabilitation

## 2012-05-09 VITALS — BP 92/63 | HR 80 | Ht 68.0 in | Wt 250.0 lb

## 2012-05-09 DIAGNOSIS — M545 Low back pain: Secondary | ICD-10-CM

## 2012-05-09 MED ORDER — DIAZEPAM 5 MG PO TABS
10.0000 mg | ORAL_TABLET | Freq: Once | ORAL | Status: AC
  Administered 2012-05-09: 10 mg via ORAL

## 2012-05-09 MED ORDER — IOHEXOL 180 MG/ML  SOLN
20.0000 mL | Freq: Once | INTRAMUSCULAR | Status: AC | PRN
  Administered 2012-05-09: 20 mL via INTRATHECAL

## 2012-05-09 MED ORDER — ONDANSETRON HCL 4 MG/2ML IJ SOLN: 4.0000 mg | Freq: Four times a day (QID) | INTRAMUSCULAR | Status: DC | PRN

## 2012-05-09 NOTE — Discharge Instructions (Signed)
Myelogram Discharge Instructions  1. Go home and rest quietly for the next 24 hours.  It is important to lie flat for the next 24 hours.  Get up only to go to the restroom.  You may lie in the bed or on a couch on your back, your stomach, your left side or your right side.  You may have one pillow under your head.  You may have pillows between your knees while you are on your side or under your knees while you are on your back.  2. DO NOT drive today.  Recline the seat as far back as it will go, while still wearing your seat belt, on the way home.  3. You may get up to go to the bathroom as needed.  You may sit up for 10 minutes to eat.  You may resume your normal diet and medications unless otherwise indicated.  Drink lots of extra fluids today and tomorrow.  4. The incidence of headache, nausea, or vomiting is about 5% (one in 20 patients).  If you develop a headache, lie flat and drink plenty of fluids until the headache goes away.  Caffeinated beverages may be helpful.  If you develop severe nausea and vomiting or a headache that does not go away with flat bed rest, call [phone].  5. You may resume normal activities after your 24 hours of bed rest is over; however, do not exert yourself strongly or do any heavy lifting tomorrow. If when you get up you have a headache when standing, go back to bed and force fluids for another 24 hours.  6. Call your physician for a follow-up appointment.  The results of your myelogram will be sent directly to your physician by the following day.  7. If you have any questions or if complications develop after you arrive home, please call [phone].  Discharge instructions have been explained to the patient.  The patient, or the person responsible for the patient, fully understands these instructions.       May resume tramadol and imipramine on May 10, 2012, after 9:30 am.

## 2012-05-09 NOTE — Progress Notes (Signed)
Patient states she has been off Tramadol and Imipramine for the past two days.  Donell Sievert, RN

## 2012-07-01 ENCOUNTER — Telehealth: Payer: Self-pay | Admitting: Obstetrics and Gynecology

## 2012-07-01 NOTE — Telephone Encounter (Signed)
Marble size lump on inside of vagina. appt sch for 07-04-12. AEX sch 07-16-12.  ld

## 2012-07-01 NOTE — Telephone Encounter (Signed)
LAURA/SR PT. APPT REQ.

## 2012-07-04 ENCOUNTER — Ambulatory Visit (INDEPENDENT_AMBULATORY_CARE_PROVIDER_SITE_OTHER): Payer: BC Managed Care – PPO | Admitting: Obstetrics and Gynecology

## 2012-07-04 ENCOUNTER — Encounter: Payer: Self-pay | Admitting: Obstetrics and Gynecology

## 2012-07-04 VITALS — BP 104/68 | Wt 262.0 lb

## 2012-07-04 DIAGNOSIS — N764 Abscess of vulva: Secondary | ICD-10-CM

## 2012-07-04 MED ORDER — CEPHALEXIN 250 MG PO CAPS: 250.0000 mg | ORAL_CAPSULE | Freq: Three times a day (TID) | ORAL | Status: AC

## 2012-07-04 NOTE — Progress Notes (Signed)
  Subjective:    Kelly Taylor is a 61 y.o. female, G3P0, who presents  for a lump in vag area poss/ cyst . Pt stated that the area burst open yesterday with green puss and a lot of blood . Odor was present als   The following portions of the patient's history were reviewed and updated as appropriate: allergies, current medications, past family history.  Review of Systems Pertinent items are noted in HPI. Breast:Negative for breast lump,nipple discharge or nipple retraction Gastrointestinal: Negative for abdominal pain, change in bowel habits or rectal bleeding Urinary:negative   Objective:    BP 104/68  Wt 262 lb (118.842 kg)    Weight:  Wt Readings from Last 1 Encounters:  07/04/12 262 lb (118.842 kg)          BMI: There is no height on file to calculate BMI.  General Appearance: Alert, appropriate appearance for age. No acute distress GYN exam: draining small sub-cutaneous abcess on right buttocks in inguinal area. No cellulitis   Assessment:    Resolving subcuaneous abcess    Plan:    Warm moist compresses or sitz baths for 5 days and treat with antibiotics ( Keflex 250 mg TID x 7 days) Follow-up AEX    [facility] AMD

## 2012-07-16 ENCOUNTER — Encounter (HOSPITAL_COMMUNITY): Payer: Self-pay | Admitting: Emergency Medicine

## 2012-07-16 ENCOUNTER — Emergency Department (HOSPITAL_COMMUNITY): Payer: BC Managed Care – PPO

## 2012-07-16 ENCOUNTER — Ambulatory Visit (INDEPENDENT_AMBULATORY_CARE_PROVIDER_SITE_OTHER): Payer: BC Managed Care – PPO | Admitting: Obstetrics and Gynecology

## 2012-07-16 ENCOUNTER — Encounter: Payer: Self-pay | Admitting: Obstetrics and Gynecology

## 2012-07-16 ENCOUNTER — Emergency Department (HOSPITAL_COMMUNITY)
Admission: EM | Admit: 2012-07-16 | Discharge: 2012-07-16 | Disposition: A | Discharge status: Home / Self Care | Payer: BC Managed Care – PPO | Attending: Emergency Medicine | Admitting: Emergency Medicine

## 2012-07-16 VITALS — BP 112/62 | Ht 60.0 in | Wt 261.0 lb

## 2012-07-16 DIAGNOSIS — Z803 Family history of malignant neoplasm of breast: Secondary | ICD-10-CM | POA: Insufficient documentation

## 2012-07-16 DIAGNOSIS — Z801 Family history of malignant neoplasm of trachea, bronchus and lung: Secondary | ICD-10-CM | POA: Insufficient documentation

## 2012-07-16 DIAGNOSIS — W010XXA Fall on same level from slipping, tripping and stumbling without subsequent striking against object, initial encounter: Secondary | ICD-10-CM | POA: Insufficient documentation

## 2012-07-16 DIAGNOSIS — T148XXA Other injury of unspecified body region, initial encounter: Secondary | ICD-10-CM | POA: Insufficient documentation

## 2012-07-16 DIAGNOSIS — Z880 Allergy status to penicillin: Secondary | ICD-10-CM | POA: Insufficient documentation

## 2012-07-16 DIAGNOSIS — Z8041 Family history of malignant neoplasm of ovary: Secondary | ICD-10-CM | POA: Insufficient documentation

## 2012-07-16 DIAGNOSIS — Z882 Allergy status to sulfonamides status: Secondary | ICD-10-CM | POA: Insufficient documentation

## 2012-07-16 DIAGNOSIS — E119 Type 2 diabetes mellitus without complications: Secondary | ICD-10-CM | POA: Insufficient documentation

## 2012-07-16 DIAGNOSIS — Z823 Family history of stroke: Secondary | ICD-10-CM | POA: Insufficient documentation

## 2012-07-16 DIAGNOSIS — Z01419 Encounter for gynecological examination (general) (routine) without abnormal findings: Secondary | ICD-10-CM

## 2012-07-16 DIAGNOSIS — M25562 Pain in left knee: Secondary | ICD-10-CM

## 2012-07-16 DIAGNOSIS — M25569 Pain in unspecified knee: Secondary | ICD-10-CM | POA: Insufficient documentation

## 2012-07-16 MED ORDER — HYDROCODONE-ACETAMINOPHEN 5-325 MG PO TABS: ORAL_TABLET | ORAL | Status: AC

## 2012-07-16 MED ORDER — OXYCODONE-ACETAMINOPHEN 5-325 MG PO TABS
1.0000 | ORAL_TABLET | Freq: Once | ORAL | Status: AC
  Administered 2012-07-16: 1 via ORAL
  Filled 2012-07-16: qty 1

## 2012-07-16 NOTE — ED Provider Notes (Signed)
History     CSN: [id number]  Arrival date & time 07/16/12  1059   First MD Initiated Contact with Patient 07/16/12 1115      Chief Complaint  Patient presents with  . Fall  . Knee Pain    (Consider location/radiation/quality/duration/timing/severity/associated sxs/prior treatment) HPI Comments: Patient presents with left knee pain that began acutely after a fall 4 days ago. Patient tripped over a speed bump in a parking lot and fell onto her hands and knees. Patient states she bumped her head but did not lose consciousness. Patient has noted bruising in the area of her left knee, right upper thigh, left breast, left face. She states that her knee pain is the worst  And she is unable to walk without limping. Patient takes Mobic and tramadol at home for pain but this has not been helping. No neck pain, trouble with movement of her jaw or eyes.   Patient is a 61 y.o. female presenting with knee pain. The history is provided by the patient.  Knee Pain This is a new problem. The current episode started in the past 7 days. The problem occurs constantly. The problem has been unchanged. Associated symptoms include arthralgias. Pertinent negatives include no joint swelling, neck pain, numbness or weakness. The symptoms are aggravated by bending and walking. She has tried oral narcotics and NSAIDs for the symptoms. The treatment provided no relief.    Past Medical History  Diagnosis Date  . Diabetes mellitus   . Arthritis   . Hx of bladder infections   . H/O varicose veins   . History of measles, mumps, or rubella   . History of chicken pox   . Trichomonas   . Yeast in stool   . Menopausal symptoms 2003  . Breast mass, left 2003  . Urge incontinence 2005  . Obesity, morbid 2006    Past Surgical History  Procedure Date  . Appendectomy   . Cholecystectomy   . Hernia repair   . Knee surgery   . Rotator cuff repair   . Breast reduction surgery   . Dilation and curettage of uterus     . Abdominal hysterectomy 02/1990  . Interstem implant     for urinary incontinence    Family History  Problem Relation Age of Onset  . Lung cancer Mother   . Stroke Paternal Grandfather   . Ovarian cancer Maternal Aunt   . Breast cancer Maternal Aunt     History  Substance Use Topics  . Smoking status: Never Smoker   . Smokeless tobacco: Never Used  . Alcohol Use: No    OB History    Grav Para Term Preterm Abortions TAB SAB Ect Mult Living   3               Review of Systems  Constitutional: Negative for activity change.  HENT: Negative for neck pain.   Musculoskeletal: Positive for arthralgias and gait problem. Negative for back pain and joint swelling.  Skin: Positive for color change. Negative for wound.  Neurological: Negative for weakness and numbness.    Allergies  Penicillins and Sulfa antibiotics  Home Medications   Current Outpatient Rx  Name Route Sig Dispense Refill  . FLUTICASONE PROPIONATE 50 MCG/ACT NA SUSP Nasal Place 2 sprays into the nose daily as needed. For nasal congestion    . HYDROCODONE-ACETAMINOPHEN 5-325 MG PO TABS Oral Take 1 tablet by mouth every 6 (six) hours as needed. For pain    . IMIPRAMINE  HCL 50 MG PO TABS Oral Take 50 mg by mouth at bedtime.    Marland Kitchen LEVOTHYROXINE SODIUM 25 MCG PO TABS Oral Take 25 mcg by mouth daily.    . MELOXICAM 15 MG PO TABS Oral Take 15 mg by mouth daily.    Marland Kitchen METFORMIN HCL 500 MG PO TABS Oral Take 500 mg by mouth 2 (two) times daily with a meal.    . METHOCARBAMOL 500 MG PO TABS Oral Take 500 mg by mouth 3 (three) times daily as needed. For muscle spasms    . SOLIFENACIN SUCCINATE 10 MG PO TABS Oral Take 5 mg by mouth daily.    . TRAMADOL HCL 50 MG PO TABS Oral Take 50-100 mg by mouth every 6 (six) hours as needed. For pain      BP 113/69  Pulse 94  Temp 97.7 F (36.5 C) (Oral)  Resp 16  SpO2 98%  Physical Exam  Nursing note and vitals reviewed. Constitutional: She is oriented to person, place, and  time. She appears well-developed and well-nourished.  HENT:  Head: Normocephalic and atraumatic.  Eyes: Pupils are equal, round, and reactive to light.  Neck: Normal range of motion. Neck supple.  Cardiovascular: Exam reveals no decreased pulses.   Pulses:      Dorsalis pedis pulses are 2+ on the left side.       Posterior tibial pulses are 2+ on the left side.  Musculoskeletal: She exhibits edema and tenderness.       Left hip: Normal.       Left knee: She exhibits decreased range of motion (active flexion to 90 degrees) and swelling (mild). tenderness (generalized) found. Medial joint line and lateral joint line tenderness noted.       Left ankle: Normal.       Compartments of lower leg are soft.   Neurological: She is alert and oriented to person, place, and time. No sensory deficit.       Motor, sensation, and vascular distal to the injury is fully intact.   Skin: Skin is warm and dry.  Psychiatric: She has a normal mood and affect.    ED Course  Procedures (including critical care time)  Labs Reviewed - No data to display Dg Knee Complete 4 Views Left  07/16/2012  *RADIOLOGY REPORT*  Clinical Data: History of fall with knee pain 3 days ago.  LEFT KNEE - COMPLETE 4+ VIEW  Comparison: No priors.  Findings: Four views of the left knee demonstrate no definite acute displaced fracture, subluxation or dislocation.  There are multiple loose bodies within the joint space. There is joint space loss, subchondral sclerosis and extensive osteophyte formation is noted in the knee joint, most severe in the medial and patellofemoral compartments.  On the lateral view there is a large ossific density anterior to the distal femur, likely representing a loose body within the joint (alternatively, this could represent a manifestation of myositis ossificans).  IMPRESSION: 1.  No acute radiographic abnormality of the left knee. 2.  Extensive degenerative changes of osteoarthritis, most severe in the medial  and patellofemoral compartments, as above. 3.  Multiple loose bodies in the joint. 4.  Ossific density anterior to the distal femur, favored to represent a loose body in the suprapatellar recess.  Alternatively, this could represent a manifestation of myositis ossificans.   Original Report Authenticated By: Florencia Reasons, M.D.      1. Left knee pain   2. Contusion     11:28 AM  Patient seen and examined. Work-up initiated. Medications ordered.   Vital signs reviewed and are as follows: Filed Vitals:   07/16/12 1107  BP: 113/69  Pulse: 94  Temp: 97.7 F (36.5 C)  Resp: 16   X-ray reviewed by myself. Severe degeneration. Pt informed.   Knee sleeve by ortho tech.   Urged ortho f/u, referral given.   1:46 PM Patient counseled on use of narcotic pain medications. Counseled not to combine these medications with others containing tylenol. Urged not to drink alcohol, drive, or perform any other activities that requires focus while taking these medications. The patient verbalizes understanding and agrees with the plan.   MDM  Knee pain after fall, no acute findings on x-ray. Compartments soft, no concern for compartment syndrome. Pt needs ortho f/u for severe arthritis.   Other injuries/contusions are mild.       Renne Crigler, [place] 07/16/12 1347

## 2012-07-16 NOTE — ED Notes (Signed)
Ortho tech [place] notified regarding application of knee sleeve for pt.

## 2012-07-16 NOTE — ED Notes (Signed)
Fell Friday 8/23 in [place] parking lot, tripped over speed bump, no LOC, bruising noted to elbows, left breast area, right thigh, and left knee. Here today because Left knee is more bruised and "just doesn't feel good" also hit cheek, slight discoloration to left cheek. Unable to walk without a limp,

## 2012-07-16 NOTE — Progress Notes (Signed)
The patient is not taking hormone replacement therapy The patient  is taking a Calcium supplement. Post-menopausal bleeding:no Hysterectomy   Last Pap: was normal August  2006 Last mammogram: was normal July  2012 Last DEXA scan : T= pt unsure Last colonoscopy:normal 2010  Urinary symptoms: none Normal bowel movements: Yes Reports abuse at home: No:  Subjective:    Kelly Taylor is a 61 y.o. female G3P0 who presents for annual exam. S/p TAH 1992 The patient has no complaints today.   The following portions of the patient's history were reviewed and updated as appropriate: allergies, current medications, past family history, past medical history, past social history, past surgical history and problem list.  Review of Systems Pertinent items are noted in HPI. Gastrointestinal:No change in bowel habits, no abdominal pain, no rectal bleeding Genitourinary:negative for dysuria, frequency, hematuria, nocturia and urinary incontinence    Objective:     BP 112/62  Ht 5' (1.524 m)  Wt 261 lb (118.389 kg)  BMI 50.97 kg/m2  Weight:  Wt Readings from Last 1 Encounters:  07/16/12 261 lb (118.389 kg)     BMI: Body mass index is 50.97 kg/(m^2). General Appearance: Alert, appropriate appearance for age. No acute distress HEENT: Grossly normal Neck / Thyroid: Supple, no masses, nodes or enlargement Lungs: clear to auscultation bilaterally Back: No CVA tenderness Breast Exam: No masses or nodes.No dimpling, nipple retraction or discharge. Cardiovascular: Regular rate and rhythm. S1, S2, no murmur Gastrointestinal: Soft, non-tender, no masses or organomegaly Pelvic Exam: Vulva and vagina appear normal. Bimanual exam revealsnormal adnexa.Uterus surgically absent Rectovaginal: normal rectal, no masses bn Lymphatic Exam: Non-palpable nodes in neck, clavicular, axillary, or inguinal regions  Skin: no rash or abnormalities Neurologic: Normal gait and speech, no tremor  Psychiatric: Alert and  oriented, appropriate affect.     Assessment:    Normal gyn exam    Plan:   mammogram return annually or prn Follow-up:  for annual exam

## 2012-07-16 NOTE — ED Notes (Signed)
MD at bedside. 

## 2012-12-13 ENCOUNTER — Encounter (HOSPITAL_COMMUNITY): Payer: Self-pay

## 2012-12-13 ENCOUNTER — Emergency Department (INDEPENDENT_AMBULATORY_CARE_PROVIDER_SITE_OTHER)
Admission: EM | Admit: 2012-12-13 | Discharge: 2012-12-13 | Disposition: A | Discharge status: Home / Self Care | Payer: BC Managed Care – PPO | Source: Home / Self Care | Attending: Family Medicine | Admitting: Family Medicine

## 2012-12-13 DIAGNOSIS — J111 Influenza due to unidentified influenza virus with other respiratory manifestations: Secondary | ICD-10-CM

## 2012-12-13 MED ORDER — ONDANSETRON 4 MG PO TBDP: 4.0000 mg | ORAL_TABLET | Freq: Three times a day (TID) | ORAL | Status: DC | PRN

## 2012-12-13 MED ORDER — HYDROCODONE-ACETAMINOPHEN 7.5-325 MG/15ML PO SOLN: 10.0000 mL | Freq: Four times a day (QID) | ORAL | Status: DC | PRN

## 2012-12-13 MED ORDER — ONDANSETRON 4 MG PO TBDP
4.0000 mg | ORAL_TABLET | Freq: Once | ORAL | Status: AC
  Administered 2012-12-13: 4 mg via ORAL

## 2012-12-13 MED ORDER — ONDANSETRON 4 MG PO TBDP
ORAL_TABLET | ORAL | Status: AC
  Filled 2012-12-13: qty 1

## 2012-12-13 MED ORDER — OSELTAMIVIR PHOSPHATE 75 MG PO CAPS: 75.0000 mg | ORAL_CAPSULE | Freq: Two times a day (BID) | ORAL | Status: DC

## 2012-12-13 MED ORDER — BENZONATATE 100 MG PO CAPS: 100.0000 mg | ORAL_CAPSULE | Freq: Three times a day (TID) | ORAL | Status: DC

## 2012-12-13 NOTE — ED Notes (Signed)
Sick since yesterday, cough/vomiting/body aches

## 2012-12-13 NOTE — ED Provider Notes (Signed)
History     CSN: [id number]  Arrival date & time 12/13/12  1629   First MD Initiated Contact with Patient 12/13/12 1632      Chief Complaint  Patient presents with  . Influenza    (Consider location/radiation/quality/duration/timing/severity/associated sxs/prior treatment) HPI Comments: 62 year old nonsmoker female with history of well controlled diabetes (reports this month hemoglobin A1c was 6) here complaining of generalized malaise, nasal congestion with clear rhinorrhea and nonproductive cough associated with nausea and vomiting since yesterday. Also a few episodes of loose stools and generalized abdominal pain. Minimal oral intake. Denies chest pain, shortness of breath or difficulty breathing. Denies dizziness. Has taken Delsym for cough which has helped. Reports subjective fever improved after acetaminophen. Several family members with similar symptoms. Patient had influenza vaccine this year.   Past Medical History  Diagnosis Date  . Diabetes mellitus   . Arthritis   . Hx of bladder infections   . H/O varicose veins   . History of measles, mumps, or rubella   . History of chicken pox   . Trichomonas   . Yeast in stool   . Menopausal symptoms 2003  . Breast mass, left 2003  . Urge incontinence 2005  . Obesity, morbid 2006    Past Surgical History  Procedure Date  . Appendectomy   . Cholecystectomy   . Hernia repair   . Knee surgery   . Rotator cuff repair   . Breast reduction surgery   . Dilation and curettage of uterus   . Abdominal hysterectomy 02/1990  . Interstem implant     for urinary incontinence    Family History  Problem Relation Age of Onset  . Lung cancer Mother   . Stroke Paternal Grandfather   . Ovarian cancer Maternal Aunt   . Breast cancer Maternal Aunt     History  Substance Use Topics  . Smoking status: Never Smoker   . Smokeless tobacco: Never Used  . Alcohol Use: No    OB History    Grav Para Term Preterm Abortions TAB SAB Ect  Mult Living   3               Review of Systems  Constitutional: Positive for fever, chills and appetite change.  HENT: Positive for congestion and rhinorrhea. Negative for neck pain and neck stiffness.   Eyes: Negative for discharge.  Respiratory: Positive for cough. Negative for shortness of breath and wheezing.   Cardiovascular: Negative for chest pain, palpitations and leg swelling.  Gastrointestinal: Positive for nausea and vomiting. Negative for diarrhea.  Genitourinary: Negative for dysuria, frequency, hematuria and flank pain.  Skin: Negative for rash.  Neurological: Negative for dizziness and headaches.  All other systems reviewed and are negative.    Allergies  Penicillins and Sulfa antibiotics  Home Medications   Current Outpatient Rx  Name  Route  Sig  Dispense  Refill  . CALCIUM CARBONATE-VITAMIN D 250-125 MG-UNIT PO TABS   Oral   Take 1 tablet by mouth daily.         Marland Kitchen VITAMIN D 1000 UNITS PO TABS   Oral   Take 1,000 Units by mouth daily.         . OMEGA-3 FATTY ACIDS 1000 MG PO CAPS   Oral   Take 1 g by mouth daily.         Marland Kitchen GLUCOSAMINE 1500 COMPLEX PO   Oral   Take 1,500 mg by mouth daily.         Marland Kitchen  LEVOTHYROXINE SODIUM 25 MCG PO TABS   Oral   Take 25 mcg by mouth daily.         . LUBIPROSTONE 24 MCG PO CAPS   Oral   Take 24 mcg by mouth daily.         . MELOXICAM 15 MG PO TABS   Oral   Take 15 mg by mouth daily.         Marland Kitchen METFORMIN HCL 500 MG PO TABS   Oral   Take 500 mg by mouth 2 (two) times daily with a meal.         . MULTI-VITAMIN/MINERALS PO TABS   Oral   Take 1 tablet by mouth daily.         Marland Kitchen SOLIFENACIN SUCCINATE 10 MG PO TABS   Oral   Take 10 mg by mouth daily.          Marland Kitchen BENZONATATE 100 MG PO CAPS   Oral   Take 1 capsule (100 mg total) by mouth every 8 (eight) hours.   21 capsule   0   . FLUTICASONE PROPIONATE 50 MCG/ACT NA SUSP   Nasal   Place 2 sprays into the nose daily as needed. For nasal  congestion         . HYDROCODONE-ACETAMINOPHEN 7.5-325 MG/15ML PO SOLN   Oral   Take 10 mLs by mouth 4 (four) times daily as needed for pain or cough.   120 mL   0   . IMIPRAMINE HCL 50 MG PO TABS   Oral   Take 50 mg by mouth at bedtime.         . METHOCARBAMOL 500 MG PO TABS   Oral   Take 500 mg by mouth 3 (three) times daily as needed. For muscle spasms         . ONDANSETRON 4 MG PO TBDP   Oral   Take 1 tablet (4 mg total) by mouth every 8 (eight) hours as needed for nausea.   10 tablet   0   . OSELTAMIVIR PHOSPHATE 75 MG PO CAPS   Oral   Take 1 capsule (75 mg total) by mouth every 12 (twelve) hours.   10 capsule   0   . TRAMADOL HCL 50 MG PO TABS   Oral   Take 50-100 mg by mouth every 6 (six) hours as needed. For pain           BP 110/80  Pulse 91  Temp 97.4 F (36.3 C) (Oral)  SpO2 92%  Physical Exam  Nursing note and vitals reviewed. Constitutional: She is oriented to person, place, and time. She appears well-developed and well-nourished. No distress.  HENT:  Head: Normocephalic and atraumatic.       Nasal Congestion with erythema and swelling of nasal turbinates, clear rhinorrhea. Mild pharyngeal erythema no exudates. No uvula deviation. No trismus. TM's with increased vascular markings and clear fluid behind TM bilaterally. No dullness, swelling or bulging   Eyes: Conjunctivae normal and EOM are normal. Pupils are equal, round, and reactive to light. Right eye exhibits no discharge. Left eye exhibits no discharge. No scleral icterus.  Neck: Neck supple. No JVD present. No thyromegaly present.  Cardiovascular: Normal rate, regular rhythm, normal heart sounds and intact distal pulses.  Exam reveals no gallop and no friction rub.   No murmur heard. Pulmonary/Chest: Effort normal and breath sounds normal. No respiratory distress. She has no wheezes. She has no rales. She exhibits no tenderness.  Abdominal: Soft. Bowel sounds are normal. She exhibits no  distension and no mass. There is no tenderness. There is no rebound and no guarding.  Lymphadenopathy:    She has no cervical adenopathy.  Neurological: She is alert and oriented to person, place, and time.  Skin: No rash noted. She is not diaphoretic.    ED Course  Procedures (including critical care time)  Labs Reviewed - No data to display No results found.   1. Influenza-like illness       MDM   Prescribed Tamiflu, ondansetron, Tessalon Perles. Encouraged none sugary drinks for hydration. Patient is not on insulin. Supportive care and red flags that should prompt her return to medical attention discussed with patient and provided in writing.       Sharin Grave, MD 12/13/12 1843

## 2013-02-10 ENCOUNTER — Other Ambulatory Visit: Payer: Self-pay

## 2013-02-10 DIAGNOSIS — Z1231 Encounter for screening mammogram for malignant neoplasm of breast: Secondary | ICD-10-CM

## 2013-03-18 ENCOUNTER — Ambulatory Visit
Admission: RE | Admit: 2013-03-18 | Discharge: 2013-03-18 | Disposition: A | Discharge status: Home / Self Care | Payer: BC Managed Care – PPO | Source: Ambulatory Visit

## 2013-03-18 DIAGNOSIS — Z1231 Encounter for screening mammogram for malignant neoplasm of breast: Secondary | ICD-10-CM

## 2013-03-24 ENCOUNTER — Encounter (HOSPITAL_COMMUNITY): Payer: Self-pay | Admitting: Pharmacy Technician

## 2013-03-24 ENCOUNTER — Other Ambulatory Visit: Payer: Self-pay | Admitting: Orthopedic Surgery

## 2013-03-24 MED ORDER — DEXAMETHASONE SODIUM PHOSPHATE 10 MG/ML IJ SOLN: 10.0000 mg | Freq: Once | INTRAMUSCULAR | Status: DC

## 2013-03-24 MED ORDER — BUPIVACAINE LIPOSOME 1.3 % IJ SUSP: 20.0000 mL | Freq: Once | INTRAMUSCULAR | Status: DC

## 2013-03-24 NOTE — Progress Notes (Signed)
Preoperative surgical orders have been place into the Epic hospital system for Kelly Taylor on 03/24/2013, 7:31 AM  by Patrica Duel for surgery on 04/07/2013.  Preop Total Knee orders including Experal, IV Tylenol, and IV Decadron as long as there are no contraindications to the above medications. Avel Peace, PA-C

## 2013-04-01 ENCOUNTER — Encounter (HOSPITAL_COMMUNITY): Payer: Self-pay

## 2013-04-01 ENCOUNTER — Encounter (HOSPITAL_COMMUNITY)
Admission: RE | Admit: 2013-04-01 | Discharge: 2013-04-01 | Disposition: A | Discharge status: Home / Self Care | Payer: BC Managed Care – PPO | Source: Ambulatory Visit | Attending: Orthopedic Surgery | Admitting: Orthopedic Surgery

## 2013-04-01 ENCOUNTER — Ambulatory Visit (HOSPITAL_COMMUNITY)
Admission: RE | Admit: 2013-04-01 | Discharge: 2013-04-01 | Disposition: A | Discharge status: Home / Self Care | Payer: BC Managed Care – PPO | Source: Ambulatory Visit | Attending: Orthopedic Surgery | Admitting: Orthopedic Surgery

## 2013-04-01 DIAGNOSIS — E119 Type 2 diabetes mellitus without complications: Secondary | ICD-10-CM | POA: Insufficient documentation

## 2013-04-01 DIAGNOSIS — M1710 Unilateral primary osteoarthritis, unspecified knee: Secondary | ICD-10-CM | POA: Insufficient documentation

## 2013-04-01 DIAGNOSIS — M5380 Other specified dorsopathies, site unspecified: Secondary | ICD-10-CM | POA: Insufficient documentation

## 2013-04-01 DIAGNOSIS — Z01818 Encounter for other preprocedural examination: Secondary | ICD-10-CM | POA: Insufficient documentation

## 2013-04-01 DIAGNOSIS — Z01812 Encounter for preprocedural laboratory examination: Secondary | ICD-10-CM | POA: Insufficient documentation

## 2013-04-01 DIAGNOSIS — Z0183 Encounter for blood typing: Secondary | ICD-10-CM | POA: Insufficient documentation

## 2013-04-01 DIAGNOSIS — I771 Stricture of artery: Secondary | ICD-10-CM | POA: Insufficient documentation

## 2013-04-01 HISTORY — DX: Dorsalgia, unspecified: M54.9

## 2013-04-01 HISTORY — DX: Other chronic pain: G89.29

## 2013-04-01 HISTORY — DX: Hypothyroidism, unspecified: E03.9

## 2013-04-01 LAB — COMPREHENSIVE METABOLIC PANEL
AST: 13 U/L (ref 0–37)
Albumin: 3.3 g/dL — ABNORMAL LOW (ref 3.5–5.2)
Alkaline Phosphatase: 91 U/L (ref 39–117)
Chloride: 106 mEq/L (ref 96–112)
Creatinine, Ser: 0.53 mg/dL (ref 0.50–1.10)
Potassium: 4 mEq/L (ref 3.5–5.1)
Total Bilirubin: 0.2 mg/dL — ABNORMAL LOW (ref 0.3–1.2)
Total Protein: 6.7 g/dL (ref 6.0–8.3)

## 2013-04-01 LAB — CBC
MCHC: 32.8 g/dL (ref 30.0–36.0)
Platelets: 237 10*3/uL (ref 150–400)
RDW: 13.9 % (ref 11.5–15.5)
WBC: 8.2 10*3/uL (ref 4.0–10.5)

## 2013-04-01 LAB — URINALYSIS, ROUTINE W REFLEX MICROSCOPIC
Bilirubin Urine: NEGATIVE
Ketones, ur: NEGATIVE mg/dL
Leukocytes, UA: NEGATIVE
Nitrite: NEGATIVE
Protein, ur: NEGATIVE mg/dL
Urobilinogen, UA: 1 mg/dL (ref 0.0–1.0)
pH: 6 (ref 5.0–8.0)

## 2013-04-01 LAB — PROTIME-INR
INR: 0.93 (ref 0.00–1.49)
Prothrombin Time: 12.4 seconds (ref 11.6–15.2)

## 2013-04-01 LAB — SURGICAL PCR SCREEN
MRSA, PCR: NEGATIVE
Staphylococcus aureus: NEGATIVE

## 2013-04-01 LAB — APTT: aPTT: 29 seconds (ref 24–37)

## 2013-04-01 NOTE — Patient Instructions (Addendum)
[address]  04/01/2013   Your procedure is scheduled on: 5-19  -2014  Report to [facility] at        1200 noon.  Call this number if you have problems the morning of surgery: [phone]  Or Presurgical Testing [phone](Tama Grosz)      Do not eat food:After Midnight.  May have clear liquids:up to 6 Hours before arrival. Nothing after :0900 am  Clear liquids include soda, tea, black coffee, apple or grape juice, broth.  Take these medicines the morning of surgery with A SIP OF WATER: Levothyroxine. Omeprazole. Tramadol. Bring interstim implant remote. Do not take any Diabetic meds AM of.   Do not wear jewelry, make-up or nail polish.  Do not wear lotions, powders, or perfumes. You may wear deodorant.  Do not shave 12 hours prior to first CHG shower(legs and under arms).(face and neck okay.)  Do not bring valuables to the hospital.  Contacts, dentures or bridgework,body piercing,  may not be worn into surgery.  Leave suitcase in the car. After surgery it may be brought to your room.  For patients admitted to the hospital, checkout time is 11:00 AM the day of discharge.   Patients discharged the day of surgery will not be allowed to drive home. Must have responsible person with you x 24 hours once discharged.  Name and phone number of your driver: Jeliyah Middlebrooks [phone] cell  Special Instructions: CHG(Chlorhedine 4%-"Hibiclens","Betasept","Aplicare") Shower Use Special Wash: see special instructions.(avoid face and genitals)   Please read over the following fact sheets that you were given: MRSA Information, Blood Transfusion fact sheet, Incentive Spirometry Instruction.    Failure to follow these instructions may result in Cancellation of your surgery.   Patient signature_______________________________________________________

## 2013-04-01 NOTE — Pre-Procedure Instructions (Signed)
5'14 EKG-report with chart. CXR done today.

## 2013-04-06 ENCOUNTER — Other Ambulatory Visit: Payer: Self-pay | Admitting: Orthopedic Surgery

## 2013-04-06 NOTE — H&P (Signed)
Irvin W. Nauman  DOB: 11/30/1950 Married / Language: English / Race: White Female  Date of Admission:  04/07/2013  Chief Complaint:  Right Knee Pain  History of Present Illness The patient is a 62 year old female who comes in for a preoperative History and Physical. The patient is scheduled for a right total knee arthroplasty to be performed by Dr. Frank V. Aluisio, MD on 04/07/2013. The patient is a 62 year old female who presents with knee complaints. The patient is seen for bilateral knee pain. The patient reports left knee and right knee symptoms including: pain and giving way. ago. She said the PA where she is in pain management recommended she come here for the knees. She said they told her they cannot manage her back pain until the knee pain is under control. She has been having trouble with the knees for several years. The right knee tends to bother her a little more than the left knee, mostly because it buckles on her more. She has a history of four knee scopes, two on each knees. Dr. Aplington and Dr. Beane, last in 2009. She has had cortisone injections in the past which are not beneficial anymore. She is ready to have the knees replaced. Both knees give her significant trouble. She has pain in both knees. They are both limiting what she can and can not do. Right knee is currently a little worse than the left. She does not get a significant amount of swelling. They do want to give out on her. She has had cortisone and visco supplement injections without benefit. She is ready to proceed with surgery. They have been treated conservatively in the past for the above stated problem and despite conservative measures, they continue to have progressive pain and severe functional limitations and dysfunction. They have failed non-operative management including home exercise, medications, and injections. It is felt that they would benefit from undergoing total joint replacement. Risks and  benefits of the procedure have been discussed with the patient and they elect to proceed with surgery. There are no active contraindications to surgery such as ongoing infection or rapidly progressive neurological disease.   Problem List Primary osteoarthritis of both knees (715.16)   Allergies RELAFEN. 03/06/2001 PENICILLIN. 03/21/2005 Hives. During Infancy SULFA. 03/21/2005 High Fever ?   Family History Osteoarthritis. mother and grandmother mothers side Heart Disease. mother and grandmother mothers side Drug / Alcohol Addiction. grandmother mothers side and grandmother fathers side Heart disease in female family member before age 65 Liver Disease, Chronic. grandmother mothers side and grandmother fathers side Hypertension. father, brother and grandfather fathers side Cancer. mother and grandmother mothers side Cerebrovascular Accident. grandmother mothers side and grandfather fathers side Diabetes Mellitus. mother and grandmother mothers side Congestive Heart Failure. grandmother mothers side   Social History Drug/Alcohol Rehab (Currently). no Current work status. working full time Exercise. Exercises weekly; does other Living situation. live with spouse Illicit drug use. no Children. 0 Alcohol use. current drinker; only occasionally per week Marital status. married Tobacco use. never smoker Number of flights of stairs before winded. 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 yes Previously in rehab. no   Past Surgical History Gallbladder Surgery. open Foot Surgery. right Hysterectomy. partial (non-cancerous) Rotator Cuff Repair. right Mammoplasty; Reduction. bilateral Arthroscopy of Knee. bilateral Appendectomy Breast Biopsy. right Dilation and Curettage of Uterus - Multiple Colon Polyp Removal - Colonoscopy Tonsillectomy   Medical    History Diabetes Mellitus, Type II Chronic Pain Gastroesophageal Reflux Disease Irritable bowel syndrome Vertigo Hiatal Hernia Hemorrhoids Urinary Incontinence Degenerative Disc Disease Measles Mumps Rubella Scarlet Fever Menopause Obesity Lumbago (724.2). 08/03/2005  Review of Systems General:Not Present- Chills, Fever, Night Sweats, Fatigue, Weight Gain, Weight Loss and Memory Loss. Skin:Not Present- Hives, Itching, Rash, Eczema and Lesions. HEENT:Not Present- Tinnitus, Headache, Double Vision, Visual Loss, Hearing Loss and Dentures. Respiratory:Not Present- Shortness of breath with exertion, Shortness of breath at rest, Allergies, Coughing up blood and Chronic Cough. Cardiovascular:Not Present- Chest Pain, Racing/skipping heartbeats, Difficulty Breathing Lying Down, Murmur, Swelling and Palpitations. Gastrointestinal:Present- Constipation. Not Present- Bloody Stool, Heartburn, Abdominal Pain, Vomiting, Nausea, Diarrhea, Difficulty Swallowing, Jaundice and Loss of appetitie. Female Genitourinary:Not Present- Blood in Urine, Urinary frequency, Weak urinary stream, Discharge, Flank Pain, Incontinence, Painful Urination, Urgency, Urinary Retention and Urinating at Night. Musculoskeletal:Present- Joint Swelling, Joint Pain, Back Pain and Spasms. Not Present- Muscle Weakness, Muscle Pain and Morning Stiffness. Neurological:Not Present- Tremor, Dizziness, Blackout spells, Paralysis, Difficulty with balance and Weakness. Psychiatric:Not Present- Insomnia.   Vitals Weight: 285 lb Height: 67.5 in Body Surface Area: 2.48 m Body Mass Index: 43.98 kg/m Pulse: 84 (Regular) Resp.: 12 (Unlabored) BP: 118/64 (Sitting, Left Arm, Standard)    Physical Exam The physical exam findings are as follows:  Note: Patient is a 62 year old female with continued bilateral knee pain.   General Mental Status - Alert, cooperative and good historian. General Appearance-  pleasant. Not in acute distress. Orientation- Oriented X3. Build & Nutrition- Well nourished and Well developed.   Head and Neck Head- normocephalic, atraumatic . Neck Global Assessment- supple. no bruit auscultated on the right and no bruit auscultated on the left.   Eye Pupil- Bilateral- Regular and Round. Motion- Bilateral- EOMI.   Chest and Lung Exam Auscultation: Breath sounds:- clear at anterior chest wall and - clear at posterior chest wall. Adventitious sounds:- No Adventitious sounds.   Cardiovascular Auscultation:Rhythm- Regular rate and rhythm. Heart Sounds- S1 WNL and S2 WNL. Murmurs & Other Heart Sounds:Auscultation of the heart reveals - No Murmurs.   Abdomen Inspection:Contour- Generalized moderate distention. Palpation/Percussion:Tenderness- Abdomen is non-tender to palpation. Rigidity (guarding)- Abdomen is soft. Auscultation:Auscultation of the abdomen reveals - Bowel sounds normal.   Female Genitourinary Not done, not pertinent to present illness  Musculoskeletal On exam very pleasant, well developed female alert and oriented in no apparent distress. Her right knee shows no effusion. Range about 10 to 120 with marked crepitus on range of motion. Varus deformity. Tender medial greater than lateral with no instability. Left knee about 10 to 120 varus deformity. Significant tenderness medial greater than lateral, no instability. Pulses, sensation and motor are intact both lower extremities. She walks with an antalgic gait pattern.  RADIOGRAPHS: AP both knees and lateral show severe tricompartmental arthritis in both knees maybe slightly worse in the right than the left. She has massive osteophytes all around both knees.  Assessment & Plan Primary osteoarthritis of both knees (715.16) Impression: Right and Left Knee  Note: Plan is for a Right Total Knee Replacement by Dr. Aluisio.  Plan is to go home.  The patient does not  have any contraindications and will recieve TXA (tranexamic acid) prior to surgery.  Signed electronically by DREW L Elynn Patteson, PA-C  

## 2013-04-07 ENCOUNTER — Encounter (HOSPITAL_COMMUNITY)
Admission: RE | Disposition: A | Discharge status: Home / Self Care | Payer: Self-pay | Source: Ambulatory Visit | Attending: Orthopedic Surgery

## 2013-04-07 ENCOUNTER — Encounter (HOSPITAL_COMMUNITY): Payer: Self-pay | Admitting: *Deleted

## 2013-04-07 ENCOUNTER — Encounter (HOSPITAL_COMMUNITY): Payer: Self-pay | Admitting: Anesthesiology

## 2013-04-07 ENCOUNTER — Inpatient Hospital Stay (HOSPITAL_COMMUNITY)
Admission: RE | Admit: 2013-04-07 | Discharge: 2013-04-09 | DRG: 209 | Disposition: A | Discharge status: Home / Self Care | Payer: BC Managed Care – PPO | Source: Ambulatory Visit | Attending: Orthopedic Surgery | Admitting: Orthopedic Surgery

## 2013-04-07 ENCOUNTER — Ambulatory Visit (HOSPITAL_COMMUNITY): Payer: BC Managed Care – PPO | Admitting: Anesthesiology

## 2013-04-07 DIAGNOSIS — E119 Type 2 diabetes mellitus without complications: Secondary | ICD-10-CM | POA: Diagnosis present

## 2013-04-07 DIAGNOSIS — Z96651 Presence of right artificial knee joint: Secondary | ICD-10-CM

## 2013-04-07 DIAGNOSIS — M179 Osteoarthritis of knee, unspecified: Secondary | ICD-10-CM | POA: Diagnosis present

## 2013-04-07 DIAGNOSIS — M1710 Unilateral primary osteoarthritis, unspecified knee: Secondary | ICD-10-CM | POA: Diagnosis present

## 2013-04-07 DIAGNOSIS — K219 Gastro-esophageal reflux disease without esophagitis: Secondary | ICD-10-CM | POA: Diagnosis present

## 2013-04-07 DIAGNOSIS — D62 Acute posthemorrhagic anemia: Secondary | ICD-10-CM

## 2013-04-07 DIAGNOSIS — Z6841 Body Mass Index (BMI) 40.0 and over, adult: Secondary | ICD-10-CM

## 2013-04-07 DIAGNOSIS — K589 Irritable bowel syndrome without diarrhea: Secondary | ICD-10-CM | POA: Diagnosis present

## 2013-04-07 HISTORY — PX: TOTAL KNEE ARTHROPLASTY: SHX125

## 2013-04-07 LAB — TYPE AND SCREEN
ABO/RH(D): B POS
Antibody Screen: NEGATIVE

## 2013-04-07 LAB — GLUCOSE, CAPILLARY

## 2013-04-07 SURGERY — ARTHROPLASTY, KNEE, TOTAL
Anesthesia: Spinal | Site: Knee | Laterality: Right | Wound class: Clean

## 2013-04-07 MED ORDER — SODIUM CHLORIDE 0.9 % IJ SOLN
INTRAMUSCULAR | Status: DC | PRN
  Administered 2013-04-07: 30 mL

## 2013-04-07 MED ORDER — CHLORHEXIDINE GLUCONATE 4 % EX LIQD
60.0000 mL | Freq: Once | CUTANEOUS | Status: DC
  Filled 2013-04-07: qty 60

## 2013-04-07 MED ORDER — MENTHOL 3 MG MT LOZG: 1.0000 | LOZENGE | OROMUCOSAL | Status: DC | PRN

## 2013-04-07 MED ORDER — DEXAMETHASONE 6 MG PO TABS
10.0000 mg | ORAL_TABLET | Freq: Three times a day (TID) | ORAL | Status: AC
  Administered 2013-04-08: 10 mg via ORAL
  Filled 2013-04-07: qty 1

## 2013-04-07 MED ORDER — VANCOMYCIN HCL 10 G IV SOLR
1500.0000 mg | INTRAVENOUS | Status: AC
  Administered 2013-04-07: 1500 mg via INTRAVENOUS
  Filled 2013-04-07: qty 1500

## 2013-04-07 MED ORDER — ACETAMINOPHEN 10 MG/ML IV SOLN
1000.0000 mg | Freq: Once | INTRAVENOUS | Status: AC
  Administered 2013-04-07: 1000 mg via INTRAVENOUS

## 2013-04-07 MED ORDER — ONDANSETRON HCL 4 MG/2ML IJ SOLN
INTRAMUSCULAR | Status: DC | PRN
  Administered 2013-04-07: 4 mg via INTRAVENOUS

## 2013-04-07 MED ORDER — LUBIPROSTONE 24 MCG PO CAPS
24.0000 ug | ORAL_CAPSULE | Freq: Every day | ORAL | Status: DC
  Administered 2013-04-07 – 2013-04-09 (×3): 24 ug via ORAL
  Filled 2013-04-07 (×3): qty 1

## 2013-04-07 MED ORDER — RIVAROXABAN 10 MG PO TABS
10.0000 mg | ORAL_TABLET | Freq: Every day | ORAL | Status: DC
  Administered 2013-04-08 – 2013-04-09 (×2): 10 mg via ORAL
  Filled 2013-04-07 (×3): qty 1

## 2013-04-07 MED ORDER — ONDANSETRON HCL 4 MG PO TABS: 4.0000 mg | ORAL_TABLET | Freq: Four times a day (QID) | ORAL | Status: DC | PRN

## 2013-04-07 MED ORDER — VANCOMYCIN HCL IN DEXTROSE 1-5 GM/200ML-% IV SOLN
1000.0000 mg | Freq: Two times a day (BID) | INTRAVENOUS | Status: AC
  Administered 2013-04-08: 1000 mg via INTRAVENOUS
  Filled 2013-04-07: qty 200

## 2013-04-07 MED ORDER — DARIFENACIN HYDROBROMIDE ER 15 MG PO TB24
15.0000 mg | ORAL_TABLET | Freq: Every day | ORAL | Status: DC
  Administered 2013-04-07 – 2013-04-09 (×3): 15 mg via ORAL
  Filled 2013-04-07 (×3): qty 1

## 2013-04-07 MED ORDER — PANTOPRAZOLE SODIUM 40 MG PO TBEC
40.0000 mg | DELAYED_RELEASE_TABLET | Freq: Every day | ORAL | Status: DC
  Filled 2013-04-07: qty 1

## 2013-04-07 MED ORDER — FENTANYL CITRATE 0.05 MG/ML IJ SOLN
INTRAMUSCULAR | Status: DC | PRN
  Administered 2013-04-07 (×3): 50 ug via INTRAVENOUS

## 2013-04-07 MED ORDER — BUPIVACAINE LIPOSOME 1.3 % IJ SUSP
INTRAMUSCULAR | Status: DC | PRN
  Administered 2013-04-07: 20 mL

## 2013-04-07 MED ORDER — MIDAZOLAM HCL 2 MG/2ML IJ SOLN
INTRAMUSCULAR | Status: AC
  Filled 2013-04-07: qty 2

## 2013-04-07 MED ORDER — METFORMIN HCL 500 MG PO TABS
500.0000 mg | ORAL_TABLET | Freq: Two times a day (BID) | ORAL | Status: DC
  Filled 2013-04-07 (×3): qty 1

## 2013-04-07 MED ORDER — SODIUM CHLORIDE 0.9 % IV SOLN
INTRAVENOUS | Status: DC
  Administered 2013-04-07 – 2013-04-08 (×2): via INTRAVENOUS

## 2013-04-07 MED ORDER — INSULIN ASPART 100 UNIT/ML ~~LOC~~ SOLN
0.0000 [IU] | Freq: Three times a day (TID) | SUBCUTANEOUS | Status: DC
  Administered 2013-04-08: 3 [IU] via SUBCUTANEOUS
  Administered 2013-04-08 – 2013-04-09 (×2): 2 [IU] via SUBCUTANEOUS

## 2013-04-07 MED ORDER — METHOCARBAMOL 100 MG/ML IJ SOLN: 500.0000 mg | Freq: Four times a day (QID) | INTRAVENOUS | Status: DC | PRN

## 2013-04-07 MED ORDER — PROMETHAZINE HCL 25 MG/ML IJ SOLN: 6.2500 mg | INTRAMUSCULAR | Status: DC | PRN

## 2013-04-07 MED ORDER — DEXAMETHASONE SODIUM PHOSPHATE 10 MG/ML IJ SOLN
10.0000 mg | Freq: Three times a day (TID) | INTRAMUSCULAR | Status: AC
Start: 2013-04-08 — End: 2013-04-08

## 2013-04-07 MED ORDER — EPHEDRINE SULFATE 50 MG/ML IJ SOLN
INTRAMUSCULAR | Status: DC | PRN
  Administered 2013-04-07: 15 mg via INTRAVENOUS
  Administered 2013-04-07 (×2): 10 mg via INTRAVENOUS
  Administered 2013-04-07: 15 mg via INTRAVENOUS

## 2013-04-07 MED ORDER — BISACODYL 10 MG RE SUPP: 10.0000 mg | Freq: Every day | RECTAL | Status: DC | PRN

## 2013-04-07 MED ORDER — FLEET ENEMA 7-19 GM/118ML RE ENEM: 1.0000 | ENEMA | Freq: Once | RECTAL | Status: AC | PRN

## 2013-04-07 MED ORDER — METHOCARBAMOL 500 MG PO TABS
500.0000 mg | ORAL_TABLET | Freq: Four times a day (QID) | ORAL | Status: DC | PRN
  Administered 2013-04-07 – 2013-04-09 (×4): 500 mg via ORAL
  Filled 2013-04-07 (×4): qty 1

## 2013-04-07 MED ORDER — OXYCODONE HCL 5 MG PO TABS
5.0000 mg | ORAL_TABLET | ORAL | Status: DC | PRN
  Administered 2013-04-07 – 2013-04-09 (×10): 10 mg via ORAL
  Filled 2013-04-07 (×10): qty 2

## 2013-04-07 MED ORDER — ONDANSETRON HCL 4 MG/2ML IJ SOLN
4.0000 mg | Freq: Four times a day (QID) | INTRAMUSCULAR | Status: DC | PRN
  Administered 2013-04-07: 4 mg via INTRAVENOUS
  Filled 2013-04-07: qty 2

## 2013-04-07 MED ORDER — POLYETHYLENE GLYCOL 3350 17 G PO PACK
17.0000 g | PACK | Freq: Every day | ORAL | Status: DC | PRN
  Administered 2013-04-08 – 2013-04-09 (×2): 17 g via ORAL

## 2013-04-07 MED ORDER — BUPIVACAINE IN DEXTROSE 0.75-8.25 % IT SOLN
INTRATHECAL | Status: DC | PRN
  Administered 2013-04-07: 1.8 mL via INTRATHECAL

## 2013-04-07 MED ORDER — ACETAMINOPHEN 325 MG PO TABS: 650.0000 mg | ORAL_TABLET | Freq: Four times a day (QID) | ORAL | Status: DC | PRN

## 2013-04-07 MED ORDER — LACTATED RINGERS IV SOLN
INTRAVENOUS | Status: DC
  Administered 2013-04-07: 1000 mL via INTRAVENOUS

## 2013-04-07 MED ORDER — PHENOL 1.4 % MT LIQD: 1.0000 | OROMUCOSAL | Status: DC | PRN

## 2013-04-07 MED ORDER — LACTATED RINGERS IV SOLN
INTRAVENOUS | Status: DC | PRN
  Administered 2013-04-07: 16:00:00 via INTRAVENOUS

## 2013-04-07 MED ORDER — DIPHENHYDRAMINE HCL 12.5 MG/5ML PO ELIX
12.5000 mg | ORAL_SOLUTION | ORAL | Status: DC | PRN
  Administered 2013-04-07: 12.5 mg via ORAL
  Filled 2013-04-07: qty 5

## 2013-04-07 MED ORDER — HYDROMORPHONE HCL PF 1 MG/ML IJ SOLN: 0.2500 mg | INTRAMUSCULAR | Status: DC | PRN

## 2013-04-07 MED ORDER — LEVOTHYROXINE SODIUM 25 MCG PO TABS
25.0000 ug | ORAL_TABLET | Freq: Every day | ORAL | Status: DC
  Administered 2013-04-08 – 2013-04-09 (×2): 25 ug via ORAL
  Filled 2013-04-07 (×3): qty 1

## 2013-04-07 MED ORDER — LIDOCAINE 5 % EX PTCH
1.0000 | MEDICATED_PATCH | CUTANEOUS | Status: DC
  Administered 2013-04-07: 1 via TRANSDERMAL
  Filled 2013-04-07 (×2): qty 1

## 2013-04-07 MED ORDER — BUPIVACAINE HCL (PF) 0.25 % IJ SOLN
INTRAMUSCULAR | Status: AC
  Filled 2013-04-07: qty 30

## 2013-04-07 MED ORDER — MIDAZOLAM HCL 5 MG/5ML IJ SOLN
INTRAMUSCULAR | Status: DC | PRN
  Administered 2013-04-07: 1 mg via INTRAVENOUS

## 2013-04-07 MED ORDER — ACETAMINOPHEN 10 MG/ML IV SOLN
INTRAVENOUS | Status: AC
  Filled 2013-04-07: qty 100

## 2013-04-07 MED ORDER — PROMETHAZINE HCL 25 MG PO TABS: 25.0000 mg | ORAL_TABLET | Freq: Four times a day (QID) | ORAL | Status: DC | PRN

## 2013-04-07 MED ORDER — POLYETHYLENE GLYCOL 3350 17 G PO PACK: 17.0000 g | PACK | Freq: Every day | ORAL | Status: DC | PRN

## 2013-04-07 MED ORDER — DOCUSATE SODIUM 100 MG PO CAPS
100.0000 mg | ORAL_CAPSULE | Freq: Two times a day (BID) | ORAL | Status: DC
  Administered 2013-04-07 – 2013-04-09 (×4): 100 mg via ORAL

## 2013-04-07 MED ORDER — METOCLOPRAMIDE HCL 5 MG/ML IJ SOLN: 5.0000 mg | Freq: Three times a day (TID) | INTRAMUSCULAR | Status: DC | PRN

## 2013-04-07 MED ORDER — ACETAMINOPHEN 650 MG RE SUPP: 650.0000 mg | Freq: Four times a day (QID) | RECTAL | Status: DC | PRN

## 2013-04-07 MED ORDER — FLUTICASONE PROPIONATE 50 MCG/ACT NA SUSP
2.0000 | Freq: Every day | NASAL | Status: DC | PRN
  Filled 2013-04-07: qty 16

## 2013-04-07 MED ORDER — BUPIVACAINE HCL 0.25 % IJ SOLN
INTRAMUSCULAR | Status: DC | PRN
  Administered 2013-04-07: 20 mL

## 2013-04-07 MED ORDER — MIDAZOLAM HCL 2 MG/2ML IJ SOLN
1.0000 mg | INTRAMUSCULAR | Status: DC | PRN
  Administered 2013-04-07: 2 mg via INTRAVENOUS

## 2013-04-07 MED ORDER — TRANEXAMIC ACID 100 MG/ML IV SOLN
1000.0000 mg | INTRAVENOUS | Status: AC
  Administered 2013-04-07: 1000 mg via INTRAVENOUS
  Filled 2013-04-07: qty 10

## 2013-04-07 MED ORDER — ACETAMINOPHEN 10 MG/ML IV SOLN
1000.0000 mg | Freq: Four times a day (QID) | INTRAVENOUS | Status: AC
  Administered 2013-04-07 – 2013-04-08 (×4): 1000 mg via INTRAVENOUS
  Filled 2013-04-07 (×7): qty 100

## 2013-04-07 MED ORDER — KETAMINE HCL 10 MG/ML IJ SOLN
INTRAMUSCULAR | Status: DC | PRN
  Administered 2013-04-07 (×2): 10 mg via INTRAVENOUS

## 2013-04-07 MED ORDER — BUPIVACAINE LIPOSOME 1.3 % IJ SUSP
20.0000 mL | Freq: Once | INTRAMUSCULAR | Status: DC
  Filled 2013-04-07: qty 20

## 2013-04-07 MED ORDER — MORPHINE SULFATE 2 MG/ML IJ SOLN
1.0000 mg | INTRAMUSCULAR | Status: DC | PRN
  Administered 2013-04-07 – 2013-04-08 (×6): 2 mg via INTRAVENOUS
  Filled 2013-04-07 (×7): qty 1

## 2013-04-07 MED ORDER — TRAMADOL HCL 50 MG PO TABS: 50.0000 mg | ORAL_TABLET | Freq: Four times a day (QID) | ORAL | Status: DC | PRN

## 2013-04-07 MED ORDER — SODIUM CHLORIDE 0.9 % IV SOLN: INTRAVENOUS | Status: DC

## 2013-04-07 MED ORDER — PROPOFOL INFUSION 10 MG/ML OPTIME
INTRAVENOUS | Status: DC | PRN
  Administered 2013-04-07: 80 ug/kg/min via INTRAVENOUS

## 2013-04-07 MED ORDER — METOCLOPRAMIDE HCL 10 MG PO TABS: 5.0000 mg | ORAL_TABLET | Freq: Three times a day (TID) | ORAL | Status: DC | PRN

## 2013-04-07 SURGICAL SUPPLY — 57 items
BAG SPEC THK2 15X12 ZIP CLS (MISCELLANEOUS) ×1
BAG ZIPLOCK 12X15 (MISCELLANEOUS) ×2 IMPLANT
BANDAGE ELASTIC 6 VELCRO ST LF (GAUZE/BANDAGES/DRESSINGS) IMPLANT
BANDAGE ESMARK 6X9 LF (GAUZE/BANDAGES/DRESSINGS) ×1 IMPLANT
BLADE SAG 18X100X1.27 (BLADE) ×2 IMPLANT
BLADE SAW SGTL 11.0X1.19X90.0M (BLADE) ×2 IMPLANT
BNDG CMPR 9X6 STRL LF SNTH (GAUZE/BANDAGES/DRESSINGS) ×1
BNDG ESMARK 6X9 LF (GAUZE/BANDAGES/DRESSINGS) ×2
BOWL SMART MIX CTS (DISPOSABLE) ×2 IMPLANT
CEMENT HV SMART SET (Cement) ×4 IMPLANT
CLOSURE STERI-STRIP 1/4X4 (GAUZE/BANDAGES/DRESSINGS) ×4 IMPLANT
CLOTH BEACON ORANGE TIMEOUT ST (SAFETY) ×2 IMPLANT
CUFF TOURN SGL QUICK 34 (TOURNIQUET CUFF) ×2
CUFF TRNQT CYL 34X4X40X1 (TOURNIQUET CUFF) ×1 IMPLANT
DECANTER SPIKE VIAL GLASS SM (MISCELLANEOUS) ×2 IMPLANT
DRAPE EXTREMITY T 121X128X90 (DRAPE) ×2 IMPLANT
DRAPE POUCH INSTRU U-SHP 10X18 (DRAPES) ×2 IMPLANT
DRAPE U-SHAPE 47X51 STRL (DRAPES) ×2 IMPLANT
DRSG ADAPTIC 3X8 NADH LF (GAUZE/BANDAGES/DRESSINGS) IMPLANT
DRSG PAD ABDOMINAL 8X10 ST (GAUZE/BANDAGES/DRESSINGS) IMPLANT
DURAPREP 26ML APPLICATOR (WOUND CARE) ×2 IMPLANT
ELECT REM PT RETURN 9FT ADLT (ELECTROSURGICAL) ×2
ELECTRODE REM PT RTRN 9FT ADLT (ELECTROSURGICAL) ×1 IMPLANT
EVACUATOR 1/8 PVC DRAIN (DRAIN) ×2 IMPLANT
FACESHIELD LNG OPTICON STERILE (SAFETY) ×10 IMPLANT
GLOVE BIO SURGEON STRL SZ7.5 (GLOVE) ×4 IMPLANT
GLOVE BIO SURGEON STRL SZ8 (GLOVE) ×4 IMPLANT
GLOVE BIOGEL PI IND STRL 8 (GLOVE) ×1 IMPLANT
GLOVE BIOGEL PI INDICATOR 8 (GLOVE) ×1
GLOVE SURG SS PI 6.5 STRL IVOR (GLOVE) ×8 IMPLANT
GOWN STRL NON-REIN LRG LVL3 (GOWN DISPOSABLE) ×2 IMPLANT
GOWN STRL REIN XL XLG (GOWN DISPOSABLE) IMPLANT
HANDPIECE INTERPULSE COAX TIP (DISPOSABLE) ×1
IMMOBILIZER KNEE 20 (SOFTGOODS)
IMMOBILIZER KNEE 20 THIGH 36 (SOFTGOODS) IMPLANT
KIT BASIN OR (CUSTOM PROCEDURE TRAY) ×2 IMPLANT
MANIFOLD NEPTUNE II (INSTRUMENTS) ×2 IMPLANT
NDL SAFETY ECLIPSE 18X1.5 (NEEDLE) ×2 IMPLANT
NEEDLE HYPO 18GX1.5 SHARP (NEEDLE) ×4
NS IRRIG 1000ML POUR BTL (IV SOLUTION) ×2 IMPLANT
PACK TOTAL JOINT (CUSTOM PROCEDURE TRAY) ×2 IMPLANT
PADDING CAST COTTON 6X4 STRL (CAST SUPPLIES) ×4 IMPLANT
POSITIONER SURGICAL ARM (MISCELLANEOUS) ×2 IMPLANT
SET HNDPC FAN SPRY TIP SCT (DISPOSABLE) ×1 IMPLANT
SPONGE GAUZE 4X4 12PLY (GAUZE/BANDAGES/DRESSINGS) ×2 IMPLANT
STRIP CLOSURE SKIN 1/2X4 (GAUZE/BANDAGES/DRESSINGS) ×4 IMPLANT
SUCTION FRAZIER 12FR DISP (SUCTIONS) ×2 IMPLANT
SUT MNCRL AB 4-0 PS2 18 (SUTURE) ×2 IMPLANT
SUT VIC AB 2-0 CT1 27 (SUTURE) ×6
SUT VIC AB 2-0 CT1 TAPERPNT 27 (SUTURE) ×3 IMPLANT
SUT VLOC 180 0 24IN GS25 (SUTURE) ×2 IMPLANT
SYR 20CC LL (SYRINGE) ×2 IMPLANT
SYR 50ML LL SCALE MARK (SYRINGE) ×2 IMPLANT
TOWEL OR 17X26 10 PK STRL BLUE (TOWEL DISPOSABLE) ×4 IMPLANT
TRAY FOLEY CATH 14FRSI W/METER (CATHETERS) ×2 IMPLANT
WATER STERILE IRR 1500ML POUR (IV SOLUTION) ×2 IMPLANT
WRAP KNEE MAXI GEL POST OP (GAUZE/BANDAGES/DRESSINGS) ×2 IMPLANT

## 2013-04-07 NOTE — Anesthesia Preprocedure Evaluation (Signed)
Anesthesia Evaluation  Patient identified by MRN, date of birth, ID band Patient awake    Reviewed: Allergy & Precautions, H&P , NPO status , Patient's Chart, lab work & pertinent test results  Airway Mallampati: III TM Distance: <3 FB Neck ROM: Full    Dental no notable dental hx.    Pulmonary neg pulmonary ROS,  breath sounds clear to auscultation  Pulmonary exam normal       Cardiovascular negative cardio ROS  Rhythm:Regular Rate:Normal     Neuro/Psych negative neurological ROS  negative psych ROS   GI/Hepatic negative GI ROS, Neg liver ROS,   Endo/Other  diabetes, Oral Hypoglycemic AgentsHypothyroidism Morbid obesity  Renal/GU negative Renal ROS  negative genitourinary   Musculoskeletal negative musculoskeletal ROS (+)   Abdominal   Peds negative pediatric ROS (+)  Hematology negative hematology ROS (+)   Anesthesia Other Findings   Reproductive/Obstetrics negative OB ROS                           Anesthesia Physical Anesthesia Plan  ASA: III  Anesthesia Plan: Spinal   Post-op Pain Management:    Induction:   Airway Management Planned: Simple Face Mask  Additional Equipment:   Intra-op Plan:   Post-operative Plan:   Informed Consent: I have reviewed the patients History and Physical, chart, labs and discussed the procedure including the risks, benefits and alternatives for the proposed anesthesia with the patient or authorized representative who has indicated his/her understanding and acceptance.     Plan Discussed with: CRNA and Surgeon  Anesthesia Plan Comments:         Anesthesia Quick Evaluation

## 2013-04-07 NOTE — Interval H&P Note (Signed)
History and Physical Interval Note:  04/07/2013 2:55 PM  Kelly Taylor  has presented today for surgery, with the diagnosis of OA OF RIGHT KNEE  The various methods of treatment have been discussed with the patient and family. After consideration of risks, benefits and other options for treatment, the patient has consented to  Procedure(s): RIGHT TOTAL KNEE ARTHROPLASTY (Right) as a surgical intervention .  The patient's history has been reviewed, patient examined, no change in status, stable for surgery.  I have reviewed the patient's chart and labs.  Questions were answered to the patient's satisfaction.     Loanne Drilling

## 2013-04-07 NOTE — Op Note (Signed)
Pre-operative diagnosis- Osteoarthritis  Right knee(s)  Post-operative diagnosis- Osteoarthritis Right knee(s)  Procedure-  Right  Total Knee Arthroplasty  Surgeon- Gus Rankin. Judithann Villamar, MD  Assistant- Avel Peace, PA-C   Anesthesia-  Spinal EBL-* No blood loss amount entered *  Drains Hemovac  Tourniquet time-  Total Tourniquet Time Documented: Thigh (Right) - 40 minutes Total: Thigh (Right) - 40 minutes    Complications- None  Condition-PACU - hemodynamically stable.   Brief Clinical Note  Kelly Taylor is a 62 y.o. year old female with end stage OA of her right knee with progressively worsening pain and dysfunction. She has constant pain, with activity and at rest and significant functional deficits with difficulties even with ADLs. She has had extensive non-op management including analgesics, injections of cortisone and viscosupplements, and home exercise program, but remains in significant pain with significant dysfunction.Radiographs show bone on bone arthritis all 3 compartments. She presents now for right Total Knee Arthroplasty.    Procedure in detail---   The patient is brought into the operating room and positioned supine on the operating table. After successful administration of  Spinal,   a tourniquet is placed high on the  Right thigh(s) and the lower extremity is prepped and draped in the usual sterile fashion. Time out is performed by the operating team and then the  Right lower extremity is wrapped in Esmarch, knee flexed and the tourniquet inflated to 300 mmHg.       A midline incision is made with a ten blade through the subcutaneous tissue to the level of the extensor mechanism. A fresh blade is used to make a medial parapatellar arthrotomy. Soft tissue over the proximal medial tibia is subperiosteally elevated to the joint line with a knife and into the semimembranosus bursa with a Cobb elevator. Soft tissue over the proximal lateral tibia is elevated with attention  being paid to avoiding the patellar tendon on the tibial tubercle. The patella is everted, knee flexed 90 degrees and the ACL and PCL are removed. Findings are bone on bone all 3 compartments with massive global osteophytes.        The drill is used to create a starting hole in the distal femur and the canal is thoroughly irrigated with sterile saline to remove the fatty contents. The 5 degree Right  valgus alignment guide is placed into the femoral canal and the distal femoral cutting block is pinned to remove 10 mm off the distal femur. Resection is made with an oscillating saw.      The tibia is subluxed forward and the menisci are removed. The extramedullary alignment guide is placed referencing proximally at the medial aspect of the tibial tubercle and distally along the second metatarsal axis and tibial crest. The block is pinned to remove 2mm off the more deficient medial  side. Resection is made with an oscillating saw. Size 4  is the most appropriate size for the tibia and the proximal tibia is prepared with the modular drill and keel punch for that size.      The femoral sizing guide is placed and size 4 narrow is most appropriate. Rotation is marked off the epicondylar axis and confirmed by creating a rectangular flexion gap at 90 degrees. The size 4 cutting block is pinned in this rotation and the anterior, posterior and chamfer cuts are made with the oscillating saw. The intercondylar block is then placed and that cut is made.      Trial size 4 tibial component, trial  size 4 narrow posterior stabilized femur and a 15  mm posterior stabilized rotating platform insert trial is placed. Full extension is achieved with excellent varus/valgus and anterior/posterior balance throughout full range of motion. The patella is everted and thickness measured to be 24  mm. Free hand resection is taken to 14 mm, a 38 template is placed, lug holes are drilled, trial patella is placed, and it tracks normally.  Osteophytes are removed off the posterior femur with the trial in place. All trials are removed and the cut bone surfaces prepared with pulsatile lavage. Cement is mixed and once ready for implantation, the size 4 tibial implant, size  4 narrow posterior stabilized femoral component, and the size 38 patella are cemented in place and the patella is held with the clamp. The trial insert is placed and the knee held in full extension. The Exparel (20 ml mixed with 30 ml saline) and .25% Bupivicaine, are injected into the extensor mechanism, posterior capsule, medial and lateral gutters and subcutaneous tissues.  All extruded cement is removed and once the cement is hard the permanent 15 mm posterior stabilized rotating platform insert is placed into the tibial tray.      The wound is copiously irrigated with saline solution and the extensor mechanism closed over a hemovac drain with #1 PDS suture. The tourniquet is released for a total tourniquet time of 40  minutes. Flexion against gravity is 140 degrees and the patella tracks normally. Subcutaneous tissue is closed with 2.0 vicryl and subcuticular with running 4.0 Monocryl. The incision is cleaned and dried and steri-strips and a bulky sterile dressing are applied. The limb is placed into a knee immobilizer and the patient is awakened and transported to recovery in stable condition.      Please note that a surgical assistant was a medical necessity for this procedure in order to perform it in a safe and expeditious manner. Surgical assistant was necessary to retract the ligaments and vital neurovascular structures to prevent injury to them and also necessary for proper positioning of the limb to allow for anatomic placement of the prosthesis.   Gus Rankin Yug Loria, MD    04/07/2013, 5:11 PM

## 2013-04-07 NOTE — H&P (View-Only) (Signed)
Kelly Taylor  DOB: February 16, 1951 Married / Language: English / Race: White Female  Date of Admission:  04/07/2013  Chief Complaint:  Right Knee Pain  History of Present Illness The patient is a 62 year old female who comes in for a preoperative History and Physical. The patient is scheduled for a right total knee arthroplasty to be performed by Dr. Gus Rankin. Aluisio, MD on 04/07/2013. The patient is a 62 year old female who presents with knee complaints. The patient is seen for bilateral knee pain. The patient reports left knee and right knee symptoms including: pain and giving way. ago. She said the PA where she is in pain management recommended she come here for the knees. She said they told her they cannot manage her back pain until the knee pain is under control. She has been having trouble with the knees for several years. The right knee tends to bother her a little more than the left knee, mostly because it buckles on her more. She has a history of four knee scopes, two on each knees. Dr. Simonne Come and Dr. Shelle Iron, last in 2009. She has had cortisone injections in the past which are not beneficial anymore. She is ready to have the knees replaced. Both knees give her significant trouble. She has pain in both knees. They are both limiting what she can and can not do. Right knee is currently a little worse than the left. She does not get a significant amount of swelling. They do want to give out on her. She has had cortisone and visco supplement injections without benefit. She is ready to proceed with surgery. They have been treated conservatively in the past for the above stated problem and despite conservative measures, they continue to have progressive pain and severe functional limitations and dysfunction. They have failed non-operative management including home exercise, medications, and injections. It is felt that they would benefit from undergoing total joint replacement. Risks and  benefits of the procedure have been discussed with the patient and they elect to proceed with surgery. There are no active contraindications to surgery such as ongoing infection or rapidly progressive neurological disease.   Problem List Primary osteoarthritis of both knees (715.16)   Allergies RELAFEN. 03/06/2001 PENICILLIN. 03/21/2005 Hives. During Infancy SULFA. 03/21/2005 High Fever ?   Family History Osteoarthritis. mother and grandmother mothers side Heart Disease. mother and grandmother mothers side Drug / Alcohol Addiction. grandmother mothers side and grandmother fathers side Heart disease in female family member before age 77 Liver Disease, Chronic. grandmother mothers side and grandmother fathers side Hypertension. father, brother and grandfather fathers side Cancer. mother and grandmother mothers side Cerebrovascular Accident. grandmother mothers side and grandfather fathers side Diabetes Mellitus. mother and grandmother mothers side Congestive Heart Failure. grandmother mothers side   Social History Drug/Alcohol Rehab (Currently). no Current work status. working full time Exercise. Exercises weekly; does other Living situation. live with spouse Illicit drug use. no Children. 0 Alcohol use. current drinker; only occasionally per week Marital status. married Tobacco use. never smoker Number of flights of stairs before winded. 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 yes Previously in rehab. no   Past Surgical History Gallbladder Surgery. open Foot Surgery. right Hysterectomy. partial (non-cancerous) Rotator Cuff Repair. right Mammoplasty; Reduction. bilateral Arthroscopy of Knee. bilateral Appendectomy Breast Biopsy. right Dilation and Curettage of Uterus - Multiple Colon Polyp Removal - Colonoscopy Tonsillectomy   Medical  History Diabetes Mellitus, Type II Chronic Pain Gastroesophageal Reflux Disease Irritable bowel syndrome Vertigo Hiatal Hernia Hemorrhoids Urinary Incontinence Degenerative Disc Disease Measles Mumps Rubella Scarlet Fever Menopause Obesity Lumbago (724.2). 08/03/2005  Review of Systems General:Not Present- Chills, Fever, Night Sweats, Fatigue, Weight Gain, Weight Loss and Memory Loss. Skin:Not Present- Hives, Itching, Rash, Eczema and Lesions. HEENT:Not Present- Tinnitus, Headache, Double Vision, Visual Loss, Hearing Loss and Dentures. Respiratory:Not Present- Shortness of breath with exertion, Shortness of breath at rest, Allergies, Coughing up blood and Chronic Cough. Cardiovascular:Not Present- Chest Pain, Racing/skipping heartbeats, Difficulty Breathing Lying Down, Murmur, Swelling and Palpitations. Gastrointestinal:Present- Constipation. Not Present- Bloody Stool, Heartburn, Abdominal Pain, Vomiting, Nausea, Diarrhea, Difficulty Swallowing, Jaundice and Loss of appetitie. Female Genitourinary:Not Present- Blood in Urine, Urinary frequency, Weak urinary stream, Discharge, Flank Pain, Incontinence, Painful Urination, Urgency, Urinary Retention and Urinating at Night. Musculoskeletal:Present- Joint Swelling, Joint Pain, Back Pain and Spasms. Not Present- Muscle Weakness, Muscle Pain and Morning Stiffness. Neurological:Not Present- Tremor, Dizziness, Blackout spells, Paralysis, Difficulty with balance and Weakness. Psychiatric:Not Present- Insomnia.   Vitals Weight: 285 lb Height: 67.5 in Body Surface Area: 2.48 m Body Mass Index: 43.98 kg/m Pulse: 84 (Regular) Resp.: 12 (Unlabored) BP: 118/64 (Sitting, Left Arm, Standard)    Physical Exam The physical exam findings are as follows:  Note: Patient is a 62 year old female with continued bilateral knee pain.   General Mental Status - Alert, cooperative and good historian. General Appearance-  pleasant. Not in acute distress. Orientation- Oriented X3. Build & Nutrition- Well nourished and Well developed.   Head and Neck Head- normocephalic, atraumatic . Neck Global Assessment- supple. no bruit auscultated on the right and no bruit auscultated on the left.   Eye Pupil- Bilateral- Regular and Round. Motion- Bilateral- EOMI.   Chest and Lung Exam Auscultation: Breath sounds:- clear at anterior chest wall and - clear at posterior chest wall. Adventitious sounds:- No Adventitious sounds.   Cardiovascular Auscultation:Rhythm- Regular rate and rhythm. Heart Sounds- S1 WNL and S2 WNL. Murmurs & Other Heart Sounds:Auscultation of the heart reveals - No Murmurs.   Abdomen Inspection:Contour- Generalized moderate distention. Palpation/Percussion:Tenderness- Abdomen is non-tender to palpation. Rigidity (guarding)- Abdomen is soft. Auscultation:Auscultation of the abdomen reveals - Bowel sounds normal.   Female Genitourinary Not done, not pertinent to present illness  Musculoskeletal On exam very pleasant, well developed female alert and oriented in no apparent distress. Her right knee shows no effusion. Range about 10 to 120 with marked crepitus on range of motion. Varus deformity. Tender medial greater than lateral with no instability. Left knee about 10 to 120 varus deformity. Significant tenderness medial greater than lateral, no instability. Pulses, sensation and motor are intact both lower extremities. She walks with an antalgic gait pattern.  RADIOGRAPHS: AP both knees and lateral show severe tricompartmental arthritis in both knees maybe slightly worse in the right than the left. She has massive osteophytes all around both knees.  Assessment & Plan Primary osteoarthritis of both knees (715.16) Impression: Right and Left Knee  Note: Plan is for a Right Total Knee Replacement by Dr. Lequita Halt.  Plan is to go home.  The patient does not  have any contraindications and will recieve TXA (tranexamic acid) prior to surgery.  Signed electronically by Roberts Gaudy, PA-C

## 2013-04-07 NOTE — Anesthesia Postprocedure Evaluation (Signed)
  Anesthesia Post-op Note  Patient: Kelly Taylor  Procedure(s) Performed: Procedure(s) (LRB): RIGHT TOTAL KNEE ARTHROPLASTY (Right)  Patient Location: PACU  Anesthesia Type: Spinal  Level of Consciousness: awake and alert   Airway and Oxygen Therapy: Patient Spontanous Breathing  Post-op Pain: mild  Post-op Assessment: Post-op Vital signs reviewed, Patient's Cardiovascular Status Stable, Respiratory Function Stable, Patent Airway and No signs of Nausea or vomiting  Last Vitals:  Filed Vitals:   04/07/13 1730  BP: 105/82  Pulse: 80  Temp:   Resp: 15    Post-op Vital Signs: stable   Complications: No apparent anesthesia complications

## 2013-04-07 NOTE — Anesthesia Procedure Notes (Signed)
Spinal  Patient location during procedure: OR Staffing Performed by: anesthesiologist  Preanesthetic Checklist Completed: patient identified, site marked, surgical consent, pre-op evaluation, timeout performed, IV checked, risks and benefits discussed and monitors and equipment checked Spinal Block Patient position: sitting Prep: Betadine Patient monitoring: heart rate, continuous pulse ox and blood pressure Injection technique: single-shot Needle Needle type: Spinocan  Needle gauge: 22 G Needle length: 12.7 cm Additional Notes Expiration date of kit checked and confirmed. Patient tolerated procedure well, without complications.

## 2013-04-07 NOTE — Transfer of Care (Signed)
Immediate Anesthesia Transfer of Care Note  Patient: Kelly Taylor  Procedure(s) Performed: Procedure(s) (LRB): RIGHT TOTAL KNEE ARTHROPLASTY (Right)  Patient Location: PACU  Anesthesia Type: Spinal  Level of Consciousness: sedated, patient cooperative and responds to stimulaton  Airway & Oxygen Therapy: Patient Spontanous Breathing and Patient connected to face mask oxgen  Post-op Assessment: Report given to PACU RN and Post -op Vital signs reviewed and stable  Post vital signs: Reviewed and stable  Complications: No apparent anesthesia complications

## 2013-04-08 DIAGNOSIS — D62 Acute posthemorrhagic anemia: Secondary | ICD-10-CM

## 2013-04-08 LAB — CBC
MCHC: 31 g/dL (ref 30.0–36.0)
Platelets: 191 10*3/uL (ref 150–400)
RDW: 14.1 % (ref 11.5–15.5)

## 2013-04-08 LAB — GLUCOSE, CAPILLARY
Glucose-Capillary: 151 mg/dL — ABNORMAL HIGH (ref 70–99)
Glucose-Capillary: 132 mg/dL — ABNORMAL HIGH (ref 70–99)
Glucose-Capillary: 144 mg/dL — ABNORMAL HIGH (ref 70–99)

## 2013-04-08 LAB — BASIC METABOLIC PANEL
Calcium: 8.3 mg/dL — ABNORMAL LOW (ref 8.4–10.5)
GFR calc Af Amer: >90 mL/min (ref >90)
GFR calc non Af Amer: >90 mL/min (ref >90)
Potassium: 4.3 mEq/L (ref 3.5–5.1)
Sodium: 138 mEq/L (ref 135–145)

## 2013-04-08 MED ORDER — LIDOCAINE 5 % EX PTCH
2.0000 | MEDICATED_PATCH | CUTANEOUS | Status: DC
  Administered 2013-04-08: 2 via TRANSDERMAL
  Filled 2013-04-08 (×2): qty 2

## 2013-04-08 MED ORDER — NON FORMULARY: 20.0000 mg | Freq: Every day | Status: DC

## 2013-04-08 MED ORDER — OMEPRAZOLE 20 MG PO CPDR
20.0000 mg | DELAYED_RELEASE_CAPSULE | Freq: Every day | ORAL | Status: DC
  Administered 2013-04-08 – 2013-04-09 (×2): 20 mg via ORAL
  Filled 2013-04-08 (×2): qty 1

## 2013-04-08 NOTE — Evaluation (Signed)
Physical Therapy Evaluation Patient Details Name: Kelly Taylor MRN: [id number] DOB: Mar 27, 1951 Today's Date: 04/08/2013 Time: 0865-7846 PT Time Calculation (min): 26 min  PT Assessment / Plan / Recommendation Clinical Impression  62 y.o. female admitted for R TKA. Pt ambulated 18' with min A and RW. Good progress expected. She would benefit from acute PT to maximize safety and independence with mobility. HHPT and RW recommended.     PT Assessment  Patient needs continued PT services    Follow Up Recommendations  Home health PT    Does the patient have the potential to tolerate intense rehabilitation      Barriers to Discharge None      Equipment Recommendations  Rolling walker with 5" wheels    Recommendations for Other Services OT consult   Frequency 7X/week    Precautions / Restrictions Precautions Precautions: Knee Restrictions RLE Weight Bearing: Weight bearing as tolerated   Pertinent Vitals/Pain *10/10 R knee with activity RN notified, ice applied, premedicated**      Mobility  Bed Mobility Bed Mobility: Supine to Sit Supine to Sit: 4: Min assist Details for Bed Mobility Assistance: min A RLE Transfers Transfers: Sit to Stand;Stand to Sit Sit to Stand: 4: Min assist;From bed;From chair/3-in-1;With armrests Stand to Sit: 4: Min assist;To chair/3-in-1;With armrests Details for Transfer Assistance: VCs hand placement, min A to manage RLE Ambulation/Gait Ambulation/Gait Assistance: 4: Min guard Ambulation Distance (Feet): 18 Feet Assistive device: Rolling walker Gait Pattern: Step-to pattern;Antalgic;Trunk flexed General Gait Details: VCs sequencing    Exercises Total Joint Exercises Ankle Circles/Pumps: AROM;Both;10 reps Quad Sets: AROM;Both;5 reps Towel Squeeze: AROM;Both;5 reps Short Arc Quad: Right;5 reps;AAROM Heel Slides: AAROM;Right;10 reps Hip ABduction/ADduction: AAROM;Right;5 reps Straight Leg Raises: AAROM;Right;5 reps   PT Diagnosis:  Difficulty walking;Acute pain  PT Problem List: Decreased strength;Decreased range of motion;Decreased activity tolerance;Pain;Decreased mobility PT Treatment Interventions: DME instruction;Gait training;Functional mobility training;Stair training   PT Goals Acute Rehab PT Goals PT Goal Formulation: With patient Time For Goal Achievement: 04/15/13 Potential to Achieve Goals: Good Pt will go Supine/Side to Sit: Independently PT Goal: Supine/Side to Sit - Progress: Goal set today Pt will go Sit to Stand: with modified independence PT Goal: Sit to Stand - Progress: Goal set today Pt will Ambulate: >150 feet;with modified independence;with rolling walker PT Goal: Ambulate - Progress: Goal set today Pt will Go Up / Down Stairs: 3-5 stairs;with min assist;with rolling walker PT Goal: Up/Down Stairs - Progress: Goal set today Pt will Perform Home Exercise Program: with supervision, verbal cues required/provided PT Goal: Perform Home Exercise Program - Progress: Goal set today  Visit Information  Last PT Received On: 04/08/13 Assistance Needed: +1    Subjective Data  Subjective: I have lumbar stenosis.  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    Cognition  Cognition Arousal/Alertness: Awake/alert Behavior During Therapy: WFL for tasks assessed/performed Overall Cognitive Status: Within Functional Limits for tasks assessed    Extremity/Trunk Assessment Right Upper Extremity Assessment RUE ROM/Strength/Tone: [facility] for tasks assessed Left  Upper Extremity Assessment LUE ROM/Strength/Tone: WFL for tasks assessed Right Lower Extremity Assessment RLE ROM/Strength/Tone: Deficits;Due to pain RLE ROM/Strength/Tone Deficits:  knee flexion AAROM 35*, extension 0*; hip flexion +2/5, ankle 5/5 RLE Sensation: WFL - Light Touch RLE Coordination: WFL - gross/fine motor Left Lower Extremity Assessment LLE ROM/Strength/Tone: Within functional levels LLE Sensation: WFL - Light Touch LLE Coordination: WFL - gross/fine motor   Balance    End of Session PT - End of Session Equipment Utilized During Treatment: Right knee immobilizer Activity Tolerance: Patient limited by pain Patient left: in chair;with call bell/phone within reach Nurse Communication: Mobility status  GP     Ralene Bathe Kistler 04/08/2013, 10:16 AM [phone]

## 2013-04-08 NOTE — Progress Notes (Signed)
   Subjective: 1 Day Post-Op Procedure(s) (LRB): RIGHT TOTAL KNEE ARTHROPLASTY (Right) Patient reports pain as mild and moderate last night.  Little better this morning.  Patient seen in rounds with Dr. Lequita Halt. Patient is well, but has had some minor complaints of pain in the knee and thigh, requiring pain medications We will start therapy today.  Plan is to go Home after hospital stay. Patient is requesting a CPM for home.  Objective: Vital signs in last 24 hours: Temp:  [97.4 F (36.3 C)-98.7 F (37.1 C)] 98.7 F (37.1 C) (05/20 0656) Pulse Rate:  [70-108] 73 (05/20 0656) Resp:  [14-16] 16 (05/20 0656) BP: (92-146)/(51-94) 114/77 mmHg (05/20 0656) SpO2:  [91 %-99 %] 91 % (05/20 0656) Weight:  [131.77 kg (290 lb 8 oz)] 131.77 kg (290 lb 8 oz) (05/19 1820)  Intake/Output from previous day:  Intake/Output Summary (Last 24 hours) at 04/08/13 0755 Last data filed at 04/08/13 0700  Gross per 24 hour  Intake   2010 ml  Output   2010 ml  Net      0 ml    Intake/Output this shift:    Labs:  Recent Labs  04/08/13 0442  HGB 10.6*    Recent Labs  04/08/13 0442  WBC 10.7*  RBC 3.98  HCT 34.2*  PLT 191    Recent Labs  04/08/13 0442  NA 138  K 4.3  CL 101  CO2 29  BUN 11  CREATININE 0.61  GLUCOSE 165*  CALCIUM 8.3*   No results found for this basename: LABPT, INR,  in the last 72 hours  EXAM General - Patient is Alert, Appropriate and Oriented Extremity - Neurovascular intact Sensation intact distally Dorsiflexion/Plantar flexion intact Dressing - dressing C/D/I Motor Function - intact, moving foot and toes well on exam.  Hemovac pulled without difficulty.  Past Medical History  Diagnosis Date  . Diabetes mellitus   . Arthritis   . Hx of bladder infections   . H/O varicose veins   . History of measles, mumps, or rubella   . History of chicken pox   . Trichomonas   . Yeast in stool   . Menopausal symptoms 2003  . Breast mass, left 2003  . Urge  incontinence 2005  . Obesity, morbid 2006  . Hypothyroidism   . Chronic back pain     seeing Pain specialist    Assessment/Plan: 1 Day Post-Op Procedure(s) (LRB): RIGHT TOTAL KNEE ARTHROPLASTY (Right) Principal Problem:   OA (osteoarthritis) of knee Active Problems:   Postoperative anemia due to acute blood loss  Estimated body mass index is 44.8 kg/(m^2) as calculated from the following:   Height as of this encounter: 5' 7.5" (1.715 m).   Weight as of this encounter: 131.77 kg (290 lb 8 oz). Advance diet Up with therapy Plan for discharge tomorrow Discharge home with home health Home CPM  DVT Prophylaxis - Xarelto Weight-Bearing as tolerated to right leg No vaccines. D/C O2 and Pulse OX and try on Room [address]  Kelly Taylor 04/08/2013, 7:55 AM

## 2013-04-08 NOTE — Progress Notes (Signed)
Physical Therapy Treatment Patient Details Name: Kelly Taylor MRN: [id number] DOB: July 16, 1951 Today's Date: 04/08/2013 Time: 4098-1191 PT Time Calculation (min): 15 min  PT Assessment / Plan / Recommendation Comments on Treatment Session  POD # 1 R TKR pm session.  Assisted pt off BSC to bed for CPM.      Follow Up Recommendations  Home health PT     Does the patient have the potential to tolerate intense rehabilitation     Barriers to Discharge        Equipment Recommendations  Rolling walker with 5" wheels    Recommendations for Other Services    Frequency 7X/week   Plan      Precautions / Restrictions Precautions Precautions: Knee Required Braces or Orthoses: Knee Immobilizer - Right Knee Immobilizer - Right: Discontinue once straight leg raise with < 10 degree lag Restrictions Weight Bearing Restrictions: No RLE Weight Bearing: Weight bearing as tolerated   Pertinent Vitals/Pain C/o fatigue    Mobility  Bed Mobility Bed Mobility: Sit to Supine Sit to Supine: 4: Min assist Details for Bed Mobility Assistance: min A RLE to get back into bed Transfers Transfers: Sit to Stand;Stand to Sit Sit to Stand: 4: Min assist;With upper extremity assist;From toilet Stand to Sit: 4: Min assist;With upper extremity assist;To bed Details for Transfer Assistance: min verbal cues on safety with turns Ambulation/Gait Ambulation/Gait Assistance Details: only assisted from [facility] to bed      PT Goals                                                              progressing    Visit Information  Last PT Received On: 04/08/13 Assistance Needed: +1    Subjective Data  Subjective: I am a CNA   Cognition  Cognition Arousal/Alertness: Awake/alert Behavior During Therapy: WFL for tasks assessed/performed Overall Cognitive Status: Within Functional Limits for tasks assessed    Balance  Balance Balance Assessed: Yes Dynamic Standing Balance Dynamic Standing - Level of  Assistance: 4: Min assist (Ue supported on walker)  End of Session PT - End of Session Equipment Utilized During Treatment: Right knee immobilizer;Gait belt Activity Tolerance: Patient limited by fatigue Patient left: in bed;with call bell/phone within reach   Felecia Shelling  PTA [facility]  Acute  Rehab Pager      [phone]

## 2013-04-08 NOTE — Evaluation (Signed)
Occupational Therapy Evaluation Patient Details Name: Kelly Taylor MRN: [id number] DOB: [birth year] Today's Date: 04/08/2013 Time: 4098-1191 OT Time Calculation (min): 16 min  OT Assessment / Plan / Recommendation Clinical Impression  Pt is s/p R TKA and displays increased pain but overall is doing well with ADL. Will benefit from skilled OT services to improve ADL independence.     OT Assessment  Patient needs continued OT Services    Follow Up Recommendations  No OT follow up;Supervision/Assistance - 24 hour    Barriers to Discharge      Equipment Recommendations  3 in 1 bedside comode;Tub/shower bench (pt checking on borrowing transfer bench. )    Recommendations for Other Services    Frequency  Min 2X/week    Precautions / Restrictions Precautions Precautions: Knee Required Braces or Orthoses: Knee Immobilizer - Right Knee Immobilizer - Right: Discontinue once straight leg raise with < 10 degree lag Restrictions RLE Weight Bearing: Weight bearing as tolerated        ADL  Eating/Feeding: Independent Where Assessed - Eating/Feeding: Chair Grooming: Wash/dry hands;Set up Where Assessed - Grooming: Supported sitting Upper Body Bathing: Chest;Right arm;Left arm;Abdomen;Set up;Supervision/safety Where Assessed - Upper Body Bathing: Unsupported sitting Lower Body Bathing: Moderate assistance Where Assessed - Lower Body Bathing: Supported sit to stand Upper Body Dressing: Supervision/safety;Set up Where Assessed - Upper Body Dressing: Unsupported sitting Lower Body Dressing: Maximal assistance Toilet Transfer: Minimal assistance Toilet Transfer Method: Other (comment) (4-5 steps around from chair to [facility]) Toilet Transfer Equipment: Bedside commode Toileting - Clothing Manipulation and Hygiene: Simulated;Minimal assistance Where Assessed - Toileting Clothing Manipulation and Hygiene: Sit to stand from 3-in-1 or toilet Equipment Used: Long-handled shoe horn;Long-handled  sponge;Reacher;Rolling walker;Sock aid ADL Comments: demonstrated all AE. pt pulled LEs up on side of bed to don socks PTA. Educated on AE option and pt is interested. She states she has a history of back problems and is not able to bend over to her feet. She is checkign with family to see if she can borrow a tub transfer bench. Pt did lean heavily on one side of RW to support self while performing toilet hygiene. Discussed safety issues with possibly tipping over RW. Need to work on safety with performing this aspect of task.     OT Diagnosis: Generalized weakness;Acute pain  OT Problem List: Decreased strength;Pain;Decreased knowledge of use of DME or AE OT Treatment Interventions: Self-care/ADL training;Therapeutic activities;DME and/or AE instruction;Patient/family education   OT Goals Acute Rehab OT Goals OT Goal Formulation: With patient Time For Goal Achievement: 04/15/13 Potential to Achieve Goals: Good ADL Goals Pt Will Perform Grooming: with supervision;Standing at sink ADL Goal: Grooming - Progress: Goal set today Pt Will Perform Lower Body Bathing: with supervision;with adaptive equipment;Sit to stand from chair;Sit to stand from bed ADL Goal: Lower Body Bathing - Progress: Goal set today Pt Will Perform Lower Body Dressing: with supervision;with adaptive equipment;Sit to stand from chair;Sit to stand from bed ADL Goal: Lower Body Dressing - Progress: Goal set today Pt Will Transfer to Toilet: with supervision;Ambulation;3-in-1;with DME ADL Goal: Toilet Transfer - Progress: Goal set today Pt Will Perform Toileting - Clothing Manipulation: with supervision;Standing ADL Goal: Toileting - Clothing Manipulation - Progress: Goal set today Pt Will Perform Toileting - Hygiene: with supervision;Sit to stand from 3-in-1/toilet ADL Goal: Toileting - Hygiene - Progress: Goal set today Pt Will Perform Tub/Shower Transfer: with min assist;Tub transfer;with DME;Transfer tub bench ADL Goal:  Tub/Shower Transfer - Progress: Goal set today  Visit Information  Last OT Received On: 04/08/13 Assistance Needed: +1    Subjective Data  Subjective: let's give it a try Patient Stated Goal: agreeable to OT   Prior Functioning     Home Living Lives With: Spouse;Family Available Help at Discharge: Family;Available 24 hours/day Type of Home: House Home Access: Stairs to enter [employer] of Steps: 3 Entrance Stairs-Rails: None Home Layout: One level Bathroom Shower/Tub: Engineer, manufacturing systems: Handicapped height Home Adaptive Equipment: Straight cane Prior Function Level of Independence: Independent with assistive device(s) Comments: works as Teacher, 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         Vision/Perception     Copywriter, advertising Arousal/Alertness: Awake/alert Behavior During Therapy: WFL for tasks assessed/performed Overall Cognitive Status: Within Functional Limits for tasks assessed    Extremity/Trunk Assessment Right Upper Extremity Assessment RUE ROM/Strength/Tone: WFL for tasks assessed Left Upper Extremity Assessment LUE ROM/Strength/Tone: WFL for tasks assessed Right Lower Extremity Assessment RLE ROM/Strength/Tone: Deficits;Due to pain RLE ROM/Strength/Tone Deficits: knee flexion AAROM 35*, extension 0*; hip flexion +2/5, ankle 5/5 RLE Sensation: WFL - Light Touch RLE Coordination: WFL - gross/fine motor Left Lower Extremity Assessment LLE ROM/Strength/Tone: Within functional levels LLE Sensation: WFL - Light Touch LLE Coordination: WFL - gross/fine motor     Mobility Bed Mobility Bed Mobility: Supine to Sit Supine to Sit: 4: Min assist Details for Bed Mobility Assistance: min A RLE Transfers Transfers: Sit to Stand;Stand to Sit Sit to Stand: 4: Min assist;With upper extremity assist;From chair/3-in-1 Stand to Sit: 4: Min assist;With upper extremity assist;To chair/3-in-1 Details for Transfer  Assistance: min verbal cues        Balance Balance Balance Assessed: Yes Dynamic Standing Balance Dynamic Standing - Level of Assistance: 4: Min assist (Ue supported on walker)   End of Session OT - End of Session Equipment Utilized During Treatment: Gait belt Activity Tolerance: Patient limited by pain Patient left: in chair;with call bell/phone within reach  GO     Lennox Laity [phone] 04/08/2013, 11:57 AM

## 2013-04-08 NOTE — Progress Notes (Signed)
Received orders for rw, commode and tub bench.  Patient has access to a tub bench.  RW and commode will be delivered to room prior to d/c.

## 2013-04-08 NOTE — Progress Notes (Signed)
Utilization review completed.  

## 2013-04-09 ENCOUNTER — Encounter (HOSPITAL_COMMUNITY): Payer: Self-pay | Admitting: Orthopedic Surgery

## 2013-04-09 LAB — CBC
HCT: 35.7 % — ABNORMAL LOW (ref 36.0–46.0)
MCHC: 31.4 g/dL (ref 30.0–36.0)
Platelets: 212 10*3/uL (ref 150–400)
RDW: 14.2 % (ref 11.5–15.5)
WBC: 11.6 10*3/uL — ABNORMAL HIGH (ref 4.0–10.5)

## 2013-04-09 LAB — BASIC METABOLIC PANEL
Chloride: 102 mEq/L (ref 96–112)
GFR calc Af Amer: >90 mL/min (ref >90)
GFR calc non Af Amer: >90 mL/min (ref >90)
Potassium: 4.2 mEq/L (ref 3.5–5.1)
Sodium: 138 mEq/L (ref 135–145)

## 2013-04-09 MED ORDER — HYDROCODONE-ACETAMINOPHEN 5-325 MG PO TABS
1.0000 | ORAL_TABLET | ORAL | Status: DC | PRN
  Administered 2013-04-09: 2 via ORAL
  Filled 2013-04-09: qty 2

## 2013-04-09 MED ORDER — SENNA 8.6 MG PO TABS
1.0000 | ORAL_TABLET | Freq: Every day | ORAL | Status: DC | PRN
  Administered 2013-04-09: 8.6 mg via ORAL
  Filled 2013-04-09: qty 1

## 2013-04-09 MED ORDER — RIVAROXABAN 10 MG PO TABS: 10.0000 mg | ORAL_TABLET | Freq: Every day | ORAL | Status: DC

## 2013-04-09 MED ORDER — HYDROCODONE-ACETAMINOPHEN 5-325 MG PO TABS: 1.0000 | ORAL_TABLET | ORAL | Status: DC | PRN

## 2013-04-09 MED ORDER — OXYCODONE HCL 5 MG PO TABS: 5.0000 mg | ORAL_TABLET | ORAL | Status: DC | PRN

## 2013-04-09 MED ORDER — METHOCARBAMOL 500 MG PO TABS: 500.0000 mg | ORAL_TABLET | Freq: Four times a day (QID) | ORAL | Status: DC | PRN

## 2013-04-09 MED ORDER — TRAMADOL HCL 50 MG PO TABS: 50.0000 mg | ORAL_TABLET | Freq: Three times a day (TID) | ORAL | Status: DC | PRN

## 2013-04-09 NOTE — Progress Notes (Addendum)
   Subjective: 2 Days Post-Op Procedure(s) (LRB): RIGHT TOTAL KNEE ARTHROPLASTY (Right) Patient reports pain as mild.   Patient seen in rounds with Dr. Lequita Halt. Patient is well, and has had no acute complaints or problems Plan is to go Home after hospital stay.  Objective: Vital signs in last 24 hours: Temp:  [98.1 F (36.7 C)-99.1 F (37.3 C)] 98.1 F (36.7 C) (05/21 0610) Pulse Rate:  [84-94] 90 (05/21 0610) Resp:  [16-18] 16 (05/21 0610) BP: (124-130)/(64-84) 130/84 mmHg (05/21 0610) SpO2:  [90 %-98 %] 91 % (05/21 0610)  Intake/Output from previous day:  Intake/Output Summary (Last 24 hours) at 04/09/13 0706 Last data filed at 04/09/13 1610  Gross per 24 hour  Intake 2600.17 ml  Output   3500 ml  Net -899.83 ml    Intake/Output this shift:    Labs:  Recent Labs  04/08/13 0442 04/09/13 0455  HGB 10.6* 11.2*    Recent Labs  04/08/13 0442 04/09/13 0455  WBC 10.7* 11.6*  RBC 3.98 4.15  HCT 34.2* 35.7*  PLT 191 212    Recent Labs  04/08/13 0442 04/09/13 0455  NA 138 138  K 4.3 4.2  CL 101 102  CO2 29 29  BUN 11 9  CREATININE 0.61 0.57  GLUCOSE 165* 138*  CALCIUM 8.3* 9.4   No results found for this basename: LABPT, INR,  in the last 72 hours  EXAM General - Patient is Alert, Appropriate and Oriented Extremity - Neurovascular intact Sensation intact distally Dorsiflexion/Plantar flexion intact No cellulitis present Dressing/Incision - clean, dry, no drainage, healing Motor Function - intact, moving foot and toes well on exam.   Past Medical History  Diagnosis Date  . Diabetes mellitus   . Arthritis   . Hx of bladder infections   . H/O varicose veins   . History of measles, mumps, or rubella   . History of chicken pox   . Trichomonas   . Yeast in stool   . Menopausal symptoms 2003  . Breast mass, left 2003  . Urge incontinence 2005  . Obesity, morbid 2006  . Hypothyroidism   . Chronic back pain     seeing Pain specialist     Assessment/Plan: 2 Days Post-Op Procedure(s) (LRB): RIGHT TOTAL KNEE ARTHROPLASTY (Right) Principal Problem:   OA (osteoarthritis) of knee Active Problems:   Postoperative anemia due to acute blood loss  Estimated body mass index is 44.8 kg/(m^2) as calculated from the following:   Height as of this encounter: 5' 7.5" (1.715 m).   Weight as of this encounter: 131.77 kg (290 lb 8 oz). Up with therapy Discharge home with home health if meets goals  DVT Prophylaxis - Xarelto Weight-Bearing as tolerated to right leg Will see how she does today.  If met goals, then home later today.  Diet - Diabetic diet Follow up - in 2 weeks Activity - WBAT Disposition - Home Condition Upon Discharge - Pending, home if improved.  D/C Meds - See DC Summary DVT Prophylaxis - Xarelto  Alexis Reber 04/09/2013, 7:06 AM

## 2013-04-09 NOTE — Progress Notes (Signed)
Occupational Therapy Treatment Patient Details Name: Kelly Taylor MRN: [id number] DOB: 1951/06/14 Today's Date: 04/09/2013 Time: 4098-1191 OT Time Calculation (min): 35 min  OT Assessment / Plan / Recommendation Comments on Treatment Session Pt doing well. Educated further on AE and coverage. She has a tubbench to borrow but isnt sure if it is the grey or [employer] white bench. She will need the white bench for appropriate fit for her weight. HHOT to assess tubbench safety/tranfer and pt verbalizes understanding to wait for HHOT before showering.     Follow Up Recommendations  Supervision/Assistance - 24 hour;Home health OT (to assess tubbench/transfer)    Barriers to Discharge       Equipment Recommendations  3 in 1 bedside comode    Recommendations for Other Services    Frequency Min 2X/week   Plan Discharge plan needs to be updated    Precautions / Restrictions Precautions Precautions: Knee Required Braces or Orthoses: Knee Immobilizer - Right Knee Immobilizer - Right: Discontinue once straight leg raise with < 10 degree lag Restrictions Weight Bearing Restrictions: No RLE Weight Bearing: Weight bearing as tolerated        ADL  Upper Body Dressing: Performed;Minimal assistance (with sock aid to don sock at EOB) Where Assessed - Upper Body Dressing: Unsupported sitting Toilet Transfer: Performed;Minimal assistance Toilet Transfer Method: Other (comment) (with RW to [facility] about 4 feet away) Toilet Transfer Equipment: Bedside commode Toileting - Clothing Manipulation and Hygiene: Performed;Minimal assistance Where Assessed - Engineer, mining and Hygiene: Standing (see below) Equipment Used: Long-handled shoe horn;Long-handled sponge;Reacher;Rolling walker;Sock aid Transfers/Ambulation Related to ADLs: Pt's husband took KI home. She called him to bring it back to use today in therapy. Helped pt up to [facility] and chair therefore without KI and she did ok. Educated her on  need to wear KI at home when up on feet and can take it off once seated. Pt verbalized understanding. ADL Comments: Pt is impulsive at times with moving. She let go of RW to reach for  phone on bed without warning and wasnt close enough to reach safely. Educated pt on coming up closer to objects she needs to reach for. She moves alittle quickly at times and picks up RW. Educated her on taking her time and keeping RW on the floor. Pt used sock aid at EOB to don R sock. Reinforced where to obtain AE. Pt also straddles her legs far apart to perform hygiene in standing and needs min assist to steady and make sure walker is steady. Pt is able to borrow a tub transfer bench but is unsure if it is the white [employer] brand that will work for her weight. Educated her to let [facility] assess bathroom safety with tub bench before she tries on her own and if she doesnt have the right bench for her weight, HH can order  appropriate seat.     OT Diagnosis:    OT Problem List:   OT Treatment Interventions:     OT Goals ADL Goals ADL Goal: Lower Body Dressing - Progress: Progressing toward goals ADL Goal: Toilet Transfer - Progress: Progressing toward goals ADL Goal: Toileting - Clothing Manipulation - Progress: Progressing toward goals ADL Goal: Toileting - Hygiene - Progress: Progressing toward goals  Visit Information  Last OT Received On: 04/09/13 Assistance Needed: +1    Subjective Data  Subjective: supposed to go home today Patient Stated Goal: home   Prior Functioning       Cognition  Cognition Arousal/Alertness: Awake/alert  Behavior During Therapy: Impulsive (can be alittle impulsive) Overall Cognitive Status: Within Functional Limits for tasks assessed    Mobility  Bed Mobility Bed Mobility: Supine to Sit Supine to Sit: 5: Supervision;HOB elevated Transfers Transfers: Sit to Stand;Stand to Sit Sit to Stand: 4: Min guard;With upper extremity assist;From bed;From chair/3-in-1 Stand to Sit: 4:  Min guard;With upper extremity assist;To chair/3-in-1 Details for Transfer Assistance: verbal cues for safety and hand placement    Exercises      Balance Balance Balance Assessed: Yes Dynamic Standing Balance Dynamic Standing - Balance Support: Left upper extremity supported Dynamic Standing - Level of Assistance: 4: Min assist   End of Session OT - End of Session Activity Tolerance: Patient tolerated treatment well Patient left: in chair;with call bell/phone within reach CPM Right Knee CPM Right Knee: Off Right Knee Flexion (Degrees): 40 Right Knee Extension (Degrees): 10  GO     Lennox Laity [phone] 04/09/2013, 9:29 AM

## 2013-04-09 NOTE — Care Management Note (Addendum)
    Page 1 of 2   04/09/2013     5:57:44 PM   CARE MANAGEMENT NOTE 04/09/2013  Patient:  Kelly Taylor, Kelly Taylor   Account Number:  [id number]  Date Initiated:  04/09/2013  Documentation initiated by:  Colleen Can  Subjective/Objective Assessment:   dx Osteoarthritis rt knee; total knee replacemnt     Action/Plan:   Cm spoke with patient. Plans are for her to return to her home where sister -in-law and spouse will be caregivers. She already has RW and will have wide 3N1 delivered to her home. She will have eval for tub bench when she gets home.   Anticipated DC Date:  04/09/2013   Anticipated DC Plan:  HOME W HOME HEALTH SERVICES  In-house referral  Clinical Social Worker      DC Planning Services  CM consult      [facility] Choice  HOME HEALTH   Choice offered to / List presented to:  C-1 Patient   DME arranged  3-N-1      DME agency  [facility]     HH arranged  HH-2 PT      [facility] agency  [facility]   Status of service:  Completed, signed off Medicare Important Message given?   (If response is "NO", the following Medicare IM given date fields will be blank) Date Medicare IM given:   Date Additional Medicare IM given:    Discharge Disposition:  HOME W HOME HEALTH SERVICES  Per UR Regulation:    If discussed at Long Length of Stay Meetings, dates discussed:    Comments:  04/09/2013 Colleen Can BSN RN CCM [phone] NOTIFIED BY CARE CO-ORDINATOR THAT PT WILL NEED HOME CPM. [facility] NOTIFIED AND WILL ARRANGE FOR HOME CPM. PT TO DISCHARGE TODAY.  04/09/2013 Colleen Can BSN RN CCM [phone] Genevieve Norlander will provide HHpt services with start date of day after pt is discharged.

## 2013-04-09 NOTE — Progress Notes (Signed)
Physical Therapy Treatment Patient Details Name: Kelly Taylor MRN: [id number] DOB: February 10, 1951 Today's Date: 04/09/2013 Time: 4098-1191 PT Time Calculation (min): 34 min  PT Assessment / Plan / Recommendation Comments on Treatment Session  POD # 2 R TKR am session.  Pt feeling very good and eager to D/C to home.  Amb in hallway and practiced negociating one step forward and backward approach using RW.  Pt performed well. Eager to D/C to home today, has met all goals.    Follow Up Recommendations  Home health PT     Does the patient have the potential to tolerate intense rehabilitation     Barriers to Discharge        Equipment Recommendations  Rolling walker with 5" wheels    Recommendations for Other Services    Frequency 7X/week   Plan      Precautions / Restrictions Precautions Precautions: Knee Required Braces or Orthoses: Knee Immobilizer - Right Knee Immobilizer - Right: Discontinue once straight leg raise with < 10 degree lag Restrictions Weight Bearing Restrictions: No RLE Weight Bearing: Weight bearing as tolerated   Pertinent Vitals/Pain C/o 4/10 knee pain ICE applied    Mobility  Bed Mobility Bed Mobility: Not assessed Supine to Sit: 5: Supervision;HOB elevated Details for Bed Mobility Assistance: Pt OOB in recliner Transfers Transfers: Sit to Stand;Stand to Sit Sit to Stand: 5: Supervision;From chair/3-in-1 Stand to Sit: 5: Supervision;To chair/3-in-1 Details for Transfer Assistance: increased time Ambulation/Gait Ambulation/Gait Assistance: 5: Supervision;4: Min guard Ambulation Distance (Feet): 65 Feet Assistive device: Rolling walker Ambulation/Gait Assistance Details: 25% VC's on proper walker to self distance and increased time Gait Pattern: Step-to pattern;Antalgic;Trunk flexed    Exercises   Total Knee Replacement TE's 10 reps B LE ankle pumps 10 reps knee presses 10 reps heel slides  10 reps SAQ's 10 reps SLR's 10 reps ABD Followed by  ICE    PT Goals                                                    progressing    Visit Information  Last PT Received On: 04/09/13 Assistance Needed: +1    Subjective Data      Cognition  Cognition Arousal/Alertness: Awake/alert Behavior During Therapy: Impulsive (can be alittle impulsive) Overall Cognitive Status: Within Functional Limits for tasks assessed    Balance  Balance Balance Assessed: Yes Dynamic Standing Balance Dynamic Standing - Balance Support: Left upper extremity supported Dynamic Standing - Level of Assistance: 4: Min assist  End of Session PT - End of Session Equipment Utilized During Treatment: Right knee immobilizer;Gait belt Activity Tolerance: Patient tolerated treatment well Patient left: in chair;with call bell/phone within reach   Felecia Shelling  PTA [facility]  Acute  Rehab Pager      [phone]

## 2013-04-23 NOTE — Discharge Summary (Signed)
Physician Discharge Summary   Patient ID: Kelly Taylor MRN: [id number] DOB/AGE: 02/01/1951 62 y.o.  Admit date: 04/07/2013 Discharge date: 04/09/2013  Primary Diagnosis: Osteoarthritis Right knee  Admission Diagnoses:  Past Medical History  Diagnosis Date  . Diabetes mellitus   . Arthritis   . Hx of bladder infections   . H/O varicose veins   . History of measles, mumps, or rubella   . History of chicken pox   . Trichomonas   . Yeast in stool   . Menopausal symptoms 2003  . Breast mass, left 2003  . Urge incontinence 2005  . Obesity, morbid 2006  . Hypothyroidism   . Chronic back pain     seeing Pain specialist   Discharge Diagnoses:   Principal Problem:   OA (osteoarthritis) of knee Active Problems:   Postoperative anemia due to acute blood loss  Estimated body mass index is 44.8 kg/(m^2) as calculated from the following:   Height as of this encounter: 5' 7.5" (1.715 m).   Weight as of this encounter: 131.77 kg (290 lb 8 oz).  Procedure:  Procedure(s) (LRB): RIGHT TOTAL KNEE ARTHROPLASTY (Right)   Consults: None  HPI: Kelly Taylor is a 62 y.o. year old female with end stage OA of her right knee with progressively worsening pain and dysfunction. She has constant pain, with activity and at rest and significant functional deficits with difficulties even with ADLs. She has had extensive non-op management including analgesics, injections of cortisone and viscosupplements, and home exercise program, but remains in significant pain with significant dysfunction.Radiographs show bone on bone arthritis all 3 compartments. She presents now for right Total Knee Arthroplasty.   Laboratory Data: Admission on 04/07/2013, Discharged on 04/09/2013  Component Date Value Range Status  . Glucose-Capillary 04/07/2013 113* 70 - 99 mg/dL Final  . Glucose-Capillary 04/07/2013 114* 70 - 99 mg/dL Final  . Comment 1 40/98/1191 Documented in Chart   Final  . Comment 2 04/07/2013 Notify RN    Final  . WBC 04/08/2013 10.7* 4.0 - 10.5 K/uL Final  . RBC 04/08/2013 3.98  3.87 - 5.11 MIL/uL Final  . Hemoglobin 04/08/2013 10.6* 12.0 - 15.0 g/dL Final  . HCT 47/82/9562 34.2* 36.0 - 46.0 % Final  . MCV 04/08/2013 85.9  78.0 - 100.0 fL Final  . MCH 04/08/2013 26.6  26.0 - 34.0 pg Final  . MCHC 04/08/2013 31.0  30.0 - 36.0 g/dL Final  . RDW 13/06/6577 14.1  11.5 - 15.5 % Final  . Platelets 04/08/2013 191  150 - 400 K/uL Final  . Sodium 04/08/2013 138  135 - 145 mEq/L Final  . Potassium 04/08/2013 4.3  3.5 - 5.1 mEq/L Final  . Chloride 04/08/2013 101  96 - 112 mEq/L Final  . CO2 04/08/2013 29  19 - 32 mEq/L Final  . Glucose, Bld 04/08/2013 165* 70 - 99 mg/dL Final  . BUN 46/96/2952 11  6 - 23 mg/dL Final  . Creatinine, Ser 04/08/2013 0.61  0.50 - 1.10 mg/dL Final  . Calcium 84/13/2440 8.3* 8.4 - 10.5 mg/dL Final  . GFR calc non Af Amer 04/08/2013 >90  >90 mL/min Final  . GFR calc Af Amer 04/08/2013 >90  >90 mL/min Final   Comment:                                 The eGFR has been calculated  using the CKD EPI equation.                          This calculation has not been                          validated in all clinical                          situations.                          eGFR's persistently                          <90 mL/min signify                          possible Chronic Kidney Disease.  . Glucose-Capillary 04/08/2013 179* 70 - 99 mg/dL Final  . Glucose-Capillary 04/08/2013 151* 70 - 99 mg/dL Final  . Comment 1 16/08/9603 Notify RN   Final  . Comment 2 04/08/2013 Documented in Chart   Final  . WBC 04/09/2013 11.6* 4.0 - 10.5 K/uL Final  . RBC 04/09/2013 4.15  3.87 - 5.11 MIL/uL Final  . Hemoglobin 04/09/2013 11.2* 12.0 - 15.0 g/dL Final  . HCT 54/07/8118 35.7* 36.0 - 46.0 % Final  . MCV 04/09/2013 86.0  78.0 - 100.0 fL Final  . MCH 04/09/2013 27.0  26.0 - 34.0 pg Final  . MCHC 04/09/2013 31.4  30.0 - 36.0 g/dL Final  . RDW 14/78/2956  14.2  11.5 - 15.5 % Final  . Platelets 04/09/2013 212  150 - 400 K/uL Final  . Sodium 04/09/2013 138  135 - 145 mEq/L Final  . Potassium 04/09/2013 4.2  3.5 - 5.1 mEq/L Final  . Chloride 04/09/2013 102  96 - 112 mEq/L Final  . CO2 04/09/2013 29  19 - 32 mEq/L Final  . Glucose, Bld 04/09/2013 138* 70 - 99 mg/dL Final  . BUN 21/30/8657 9  6 - 23 mg/dL Final  . Creatinine, Ser 04/09/2013 0.57  0.50 - 1.10 mg/dL Final  . Calcium 84/69/6295 9.4  8.4 - 10.5 mg/dL Final  . GFR calc non Af Amer 04/09/2013 >90  >90 mL/min Final  . GFR calc Af Amer 04/09/2013 >90  >90 mL/min Final   Comment:                                 The eGFR has been calculated                          using the CKD EPI equation.                          This calculation has not been                          validated in all clinical                          situations.  eGFR's persistently                          <90 mL/min signify                          possible Chronic Kidney Disease.  . Glucose-Capillary 04/08/2013 132* 70 - 99 mg/dL Final  . Comment 1 16/08/9603 Notify RN   Final  . Comment 2 04/08/2013 Documented in Chart   Final  . Glucose-Capillary 04/08/2013 144* 70 - 99 mg/dL Final  . Glucose-Capillary 04/09/2013 121* 70 - 99 mg/dL Final  . Glucose-Capillary 04/09/2013 118* 70 - 99 mg/dL Final  Hospital Outpatient Visit on 04/01/2013  Component Date Value Range Status  . MRSA, PCR 04/01/2013 NEGATIVE  NEGATIVE Final  . Staphylococcus aureus 04/01/2013 NEGATIVE  NEGATIVE Final   Comment:                                 The Xpert SA Assay (FDA                          approved for NASAL specimens                          in patients over 51 years of age),                          is one component of                          a comprehensive surveillance                          program.  Test performance has                          been validated by [employer] for patients greater                          than or equal to 27 year old.                          It is not intended                          to diagnose infection nor to                          guide or monitor treatment.  Marland Kitchen aPTT 04/01/2013 29  24 - 37 seconds Final  . WBC 04/01/2013 8.2  4.0 - 10.5 K/uL Final  . RBC 04/01/2013 4.55  3.87 - 5.11 MIL/uL Final  . Hemoglobin 04/01/2013 12.6  12.0 - 15.0 g/dL Final  . HCT 54/07/8118 38.4  36.0 - 46.0 % Final  . MCV 04/01/2013 84.4  78.0 - 100.0 fL Final  . MCH 04/01/2013 27.7  26.0 - 34.0 pg Final  . MCHC 04/01/2013 32.8  30.0 - 36.0 g/dL Final  . RDW 16/08/9603 13.9  11.5 - 15.5 % Final  . Platelets 04/01/2013 237  150 - 400 K/uL Final  . Sodium 04/01/2013 142  135 - 145 mEq/L Final  . Potassium 04/01/2013 4.0  3.5 - 5.1 mEq/L Final  . Chloride 04/01/2013 106  96 - 112 mEq/L Final  . CO2 04/01/2013 28  19 - 32 mEq/L Final  . Glucose, Bld 04/01/2013 145* 70 - 99 mg/dL Final  . BUN 54/07/8118 15  6 - 23 mg/dL Final  . Creatinine, Ser 04/01/2013 0.53  0.50 - 1.10 mg/dL Final  . Calcium 14/78/2956 9.1  8.4 - 10.5 mg/dL Final  . Total Protein 04/01/2013 6.7  6.0 - 8.3 g/dL Final  . Albumin 21/30/8657 3.3* 3.5 - 5.2 g/dL Final  . AST 84/69/6295 13  0 - 37 U/L Final  . ALT 04/01/2013 12  0 - 35 U/L Final  . Alkaline Phosphatase 04/01/2013 91  39 - 117 U/L Final  . Total Bilirubin 04/01/2013 0.2* 0.3 - 1.2 mg/dL Final  . GFR calc non Af Amer 04/01/2013 >90  >90 mL/min Final  . GFR calc Af Amer 04/01/2013 >90  >90 mL/min Final   Comment:                                 The eGFR has been calculated                          using the CKD EPI equation.                          This calculation has not been                          validated in all clinical                          situations.                          eGFR's persistently                          <90 mL/min signify                          possible Chronic Kidney  Disease.  Marland Kitchen Prothrombin Time 04/01/2013 12.4  11.6 - 15.2 seconds Final  . INR 04/01/2013 0.93  0.00 - 1.49 Final  . ABO/RH(D) 04/01/2013 B POS   Final  . Antibody Screen 04/01/2013 NEG   Final  . Sample Expiration 04/01/2013 04/10/2013   Final  . Color, Urine 04/01/2013 YELLOW  YELLOW Final  . APPearance 04/01/2013 CLEAR  CLEAR Final  . Specific Gravity, Urine 04/01/2013 1.029  1.005 - 1.030 Final  . pH 04/01/2013 6.0  5.0 - 8.0 Final  . Glucose, UA 04/01/2013 NEGATIVE  NEGATIVE mg/dL Final  . Hgb urine dipstick 04/01/2013 NEGATIVE  NEGATIVE Final  . Bilirubin Urine 04/01/2013 NEGATIVE  NEGATIVE Final  . Ketones, ur 04/01/2013 NEGATIVE  NEGATIVE mg/dL Final  . Protein, ur 28/41/3244 NEGATIVE  NEGATIVE mg/dL Final  . Urobilinogen, UA 04/01/2013 1.0  0.0 - 1.0 mg/dL Final  . Nitrite  04/01/2013 NEGATIVE  NEGATIVE Final  . Leukocytes, UA 04/01/2013 NEGATIVE  NEGATIVE Final   MICROSCOPIC NOT DONE ON URINES WITH NEGATIVE PROTEIN, BLOOD, LEUKOCYTES, NITRITE, OR GLUCOSE <1000 mg/dL.  . ABO/RH(D) 04/01/2013 B POS   Final     X-Rays:Dg Chest 2 View  04/01/2013   *RADIOLOGY REPORT*  Clinical Data: Preoperative respiratory evaluation prior to right total knee arthroplasty for osteoarthritis.  CHEST - 2 VIEW  Comparison: Portable chest x-ray 03/16/2010, 04/30/2007.  Two-view chest x-ray 01/26/2007.  Findings: Cardiac silhouette normal in size, unchanged.  Thoracic aorta mildly tortuous, unchanged.  Hilar and mediastinal contours otherwise unremarkable.  Stable scarring in the left lower lobe. Lungs otherwise clear.  Bronchovascular markings normal.  Pulmonary vascularity normal.  No pneumothorax.  No pleural effusions. Degenerative changes and DISH involving the thoracic spine.  IMPRESSION: Scarring in the left lower lobe.  No acute cardiopulmonary disease. Stable examination.   Original Report Authenticated By: Hulan Saas, M.D.    EKG: Orders placed during the hospital encounter of 04/21/11  .  EKG  . EKG     Hospital Course: Kelly Taylor is a 62 y.o. who was admitted to [facility]. They were brought to the operating room on 04/07/2013 and underwent Procedure(s): RIGHT TOTAL KNEE ARTHROPLASTY.  Patient tolerated the procedure well and was later transferred to the recovery room and then to the orthopaedic floor for postoperative care.  They were given PO and IV analgesics for pain control following their surgery.  They were given 24 hours of postoperative antibiotics of  Anti-infectives   Start     Dose/Rate Route Frequency Ordered Stop   04/08/13 0400  vancomycin (VANCOCIN) IVPB 1000 mg/200 mL premix     1,000 mg 200 mL/hr over 60 Minutes Intravenous Every 12 hours 04/07/13 1834 04/08/13 0457   04/07/13 1151  vancomycin (VANCOCIN) 1,500 mg in sodium chloride 0.9 % 500 mL IVPB     1,500 mg 250 mL/hr over 120 Minutes Intravenous On call to O.R. 04/07/13 1151 04/07/13 1530     and started on DVT prophylaxis in the form of Xarelto.   PT and OT were ordered for total joint protocol.  Discharge planning consulted to help with postop disposition and equipment needs including a CPM for home.  Patient had a rough night on the evening of surgery due to pain.  They started to get up OOB with therapy on day one walking over 15 feet. Hemovac drain was pulled without difficulty.  Continued to work with therapy into day two.  Dressing was changed on day two and the incision was healing well.  Patient was seen in rounds and was ready to go home.   Discharge Medications: Prior to Admission medications   Medication Sig Start Date End Date Taking? Authorizing Provider  docusate sodium (COLACE) 100 MG capsule Take 100 mg by mouth daily as needed for constipation.   Yes Historical Provider, MD  fluticasone (FLONASE) 50 MCG/ACT nasal spray Place 2 sprays into the nose daily as needed (for nasal congestion).    Yes Historical Provider, MD  levothyroxine (SYNTHROID, LEVOTHROID) 25 MCG tablet Take  25 mcg by mouth daily.   Yes Historical Provider, MD  lidocaine (LIDODERM) 5 % Place 1 patch onto the skin daily. Remove & Discard patch within 12 hours or as directed by MD. Apply to back.   Yes Historical Provider, MD  lubiprostone (AMITIZA) 24 MCG capsule Take 24 mcg by mouth daily.   Yes Historical Provider, MD  omeprazole (PRILOSEC) 20 MG capsule Take 20 mg by mouth daily.   Yes Historical Provider, MD  solifenacin (VESICARE) 10 MG tablet Take 10 mg by mouth daily.    Yes Historical Provider, MD  HYDROcodone-acetaminophen (NORCO/VICODIN) 5-325 MG per tablet Take 1-2 tablets by mouth every 4 (four) hours as needed. 04/09/13   Georgi Navarrete Julien Girt, PA-C  metFORMIN (GLUCOPHAGE) 500 MG tablet Take 500 mg by mouth 2 (two) times daily with a meal.    Historical Provider, MD  methocarbamol (ROBAXIN) 500 MG tablet Take 1 tablet (500 mg total) by mouth every 6 (six) hours as needed. 04/09/13   Eveline Sauve, PA-C  oxyCODONE (OXY IR/ROXICODONE) 5 MG immediate release tablet Take 1-2 tablets (5-10 mg total) by mouth every 3 (three) hours as needed. 04/09/13   Adonis Yim, PA-C  polyethylene glycol (MIRALAX / GLYCOLAX) packet Take 17 g by mouth daily as needed (for constipation).    Historical Provider, MD  promethazine (PHENERGAN) 25 MG tablet Take 25 mg by mouth every 6 (six) hours as needed for nausea.    Historical Provider, MD  rivaroxaban (XARELTO) 10 MG TABS tablet Take 1 tablet (10 mg total) by mouth daily with breakfast. Take Xarelto for two and a half more weeks, then discontinue Xarelto. 04/09/13   Charels Stambaugh, PA-C  traMADol (ULTRAM) 50 MG tablet Take 1 tablet (50 mg total) by mouth every 8 (eight) hours as needed for pain (mild pain). 04/09/13   Izeyah Deike Julien Girt, PA-C    Diet: Diabetic diet Activity:WBAT Follow-up:in 2 weeks Disposition - Home Discharged Condition: good   Discharge Orders   Future Orders Complete By Expires     CPM  As directed     Comments:       Continuous passive motion machine (CPM):      Use the CPM from 0 degrees to 40 degrees for 6 hours per day.      You may increase by 5-10 degrees per day.  You may break it up into 2 or 3 sessions per day.      Use CPM for 2-3 weeks or until you are told to stop.    Call MD / Call 911  As directed     Comments:      If you experience chest pain or shortness of breath, CALL 911 and be transported to the hospital emergency room.  If you develope a fever above 101 F, pus (white drainage) or increased drainage or redness at the wound, or calf pain, call your surgeon's office.    Change dressing  As directed     Comments:      Change dressing daily with sterile 4 x 4 inch gauze dressing and apply TED hose. Do not submerge the incision under water.    Constipation Prevention  As directed     Comments:      Drink plenty of fluids.  Prune juice may be helpful.  You may use a stool softener, such as Colace (over the counter) 100 mg twice a day.  Use MiraLax (over the counter) for constipation as needed.    Diet - low sodium heart healthy  As directed     Discharge instructions  As directed     Comments:      Pick up stool softner and laxative for home. Do not submerge incision under water. May shower. Continue to use ice for pain and swelling from surgery. Take Xarelto for two and a half more weeks, then discontinue Xarelto.  Do not put a pillow under the knee. Place it under the heel.  As directed     Do not sit on low chairs, stoools or toilet seats, as it may be difficult to get up from low surfaces  As directed     Driving restrictions  As directed     Comments:      No driving until released by the physician.    Increase activity slowly as tolerated  As directed     Lifting restrictions  As directed     Comments:      No lifting until released by the physician.    Patient may shower  As directed     Comments:      You may shower without a dressing once there is no drainage.  Do not wash  over the wound.  If drainage remains, do not shower until drainage stops.    TED hose  As directed     Comments:      Use stockings (TED hose) for 3 weeks on both leg(s).  You may remove them at night for sleeping.    Weight bearing as tolerated  As directed         Medication List    STOP taking these medications       meloxicam 15 MG tablet  Commonly known as:  MOBIC     multivitamin with minerals tablet      TAKE these medications       AMITIZA 24 MCG capsule  Generic drug:  lubiprostone  Take 24 mcg by mouth daily.     docusate sodium 100 MG capsule  Commonly known as:  COLACE  Take 100 mg by mouth daily as needed for constipation.     fluticasone 50 MCG/ACT nasal spray  Commonly known as:  FLONASE  Place 2 sprays into the nose daily as needed (for nasal congestion).     HYDROcodone-acetaminophen 5-325 MG per tablet  Commonly known as:  NORCO/VICODIN  Take 1-2 tablets by mouth every 4 (four) hours as needed.     levothyroxine 25 MCG tablet  Commonly known as:  SYNTHROID, LEVOTHROID  Take 25 mcg by mouth daily.     lidocaine 5 %  Commonly known as:  LIDODERM  Place 1 patch onto the skin daily. Remove & Discard patch within 12 hours or as directed by MD. Apply to back.     metFORMIN 500 MG tablet  Commonly known as:  GLUCOPHAGE  Take 500 mg by mouth 2 (two) times daily with a meal.     methocarbamol 500 MG tablet  Commonly known as:  ROBAXIN  Take 1 tablet (500 mg total) by mouth every 6 (six) hours as needed.     omeprazole 20 MG capsule  Commonly known as:  PRILOSEC  Take 20 mg by mouth daily.     oxyCODONE 5 MG immediate release tablet  Commonly known as:  Oxy IR/ROXICODONE  Take 1-2 tablets (5-10 mg total) by mouth every 3 (three) hours as needed.     polyethylene glycol packet  Commonly known as:  MIRALAX / GLYCOLAX  Take 17 g by mouth daily as needed (for constipation).     promethazine 25 MG tablet  Commonly known as:  PHENERGAN  Take 25 mg  by mouth every 6 (six) hours as needed for nausea.     rivaroxaban 10 MG Tabs tablet  Commonly known as:  XARELTO  Take 1 tablet (10 mg total) by mouth  daily with breakfast. Take Xarelto for two and a half more weeks, then discontinue Xarelto.     solifenacin 10 MG tablet  Commonly known as:  VESICARE  Take 10 mg by mouth daily.     traMADol 50 MG tablet  Commonly known as:  ULTRAM  Take 1 tablet (50 mg total) by mouth every 8 (eight) hours as needed for pain (mild pain).           Follow-up Information   Follow up with Loanne Drilling, MD. Schedule an appointment as soon as possible for a visit in 2 weeks.   Contact information:   [address], [address] [place] [address] [phone]       Signed: Patrica Duel 04/23/2013, 5:12 PM

## 2013-09-09 NOTE — Progress Notes (Signed)
Need orders when able please - pt coming for preop TUES 09/16/13 - thankyou 

## 2013-09-11 ENCOUNTER — Encounter (HOSPITAL_COMMUNITY): Payer: Self-pay | Admitting: Pharmacy Technician

## 2013-09-14 ENCOUNTER — Other Ambulatory Visit: Payer: Self-pay | Admitting: Orthopedic Surgery

## 2013-09-16 ENCOUNTER — Encounter (HOSPITAL_COMMUNITY)
Admission: RE | Admit: 2013-09-16 | Discharge: 2013-09-16 | Disposition: A | Discharge status: Home / Self Care | Payer: BC Managed Care – PPO | Source: Ambulatory Visit | Attending: Orthopedic Surgery | Admitting: Orthopedic Surgery

## 2013-09-16 ENCOUNTER — Encounter (HOSPITAL_COMMUNITY): Payer: Self-pay

## 2013-09-16 DIAGNOSIS — Z01812 Encounter for preprocedural laboratory examination: Secondary | ICD-10-CM | POA: Insufficient documentation

## 2013-09-16 HISTORY — DX: Polyp of colon: K63.5

## 2013-09-16 HISTORY — DX: Irritable bowel syndrome, unspecified: K58.9

## 2013-09-16 HISTORY — DX: Unspecified hemorrhoids: K64.9

## 2013-09-16 HISTORY — DX: Personal history of other infectious and parasitic diseases: Z86.19

## 2013-09-16 HISTORY — DX: Reserved for concepts with insufficient information to code with codable children: IMO0002

## 2013-09-16 HISTORY — DX: Dizziness and giddiness: R42

## 2013-09-16 HISTORY — DX: Gastro-esophageal reflux disease without esophagitis: K21.9

## 2013-09-16 LAB — COMPREHENSIVE METABOLIC PANEL
ALT: 13 U/L (ref 0–35)
Alkaline Phosphatase: 99 U/L (ref 39–117)
BUN: 17 mg/dL (ref 6–23)
CO2: 22 mEq/L (ref 19–32)
Chloride: 106 mEq/L (ref 96–112)
GFR calc Af Amer: >90 mL/min (ref >90)
GFR calc non Af Amer: >90 mL/min (ref >90)
Glucose, Bld: 113 mg/dL — ABNORMAL HIGH (ref 70–99)
Potassium: 4 mEq/L (ref 3.5–5.1)
Sodium: 138 mEq/L (ref 135–145)
Total Bilirubin: 0.4 mg/dL (ref 0.3–1.2)

## 2013-09-16 LAB — CBC
MCV: 84.7 fL (ref 78.0–100.0)
Platelets: 236 10*3/uL (ref 150–400)
RBC: 4.85 MIL/uL (ref 3.87–5.11)
WBC: 8.5 10*3/uL (ref 4.0–10.5)

## 2013-09-16 LAB — URINALYSIS, ROUTINE W REFLEX MICROSCOPIC
Bilirubin Urine: NEGATIVE
Glucose, UA: NEGATIVE mg/dL
Hgb urine dipstick: NEGATIVE
Ketones, ur: NEGATIVE mg/dL
Protein, ur: NEGATIVE mg/dL
Urobilinogen, UA: 0.2 mg/dL (ref 0.0–1.0)

## 2013-09-16 LAB — PROTIME-INR
INR: 0.9 (ref 0.00–1.49)
Prothrombin Time: 12 seconds (ref 11.6–15.2)

## 2013-09-16 LAB — APTT: aPTT: 27 seconds (ref 24–37)

## 2013-09-16 LAB — SURGICAL PCR SCREEN
MRSA, PCR: NEGATIVE
Staphylococcus aureus: NEGATIVE

## 2013-09-16 NOTE — Patient Instructions (Signed)
20 Kelly Taylor  09/16/2013   Your procedure is scheduled on: 09/22/13  Report to [facility] at 10:45 AM.  Call this number if you have problems the morning of surgery 336-: [phone]   Remember:   Do not eat food or drink liquids After Midnight.     Take these medicines the morning of surgery with A SIP OF WATER: levothyroxine, omeprazole   Do not wear jewelry, make-up or nail polish.  Do not wear lotions, powders, or perfumes. You may wear deodorant.  Do not shave 48 hours prior to surgery. Men may shave face and neck.  Do not bring valuables to the hospital.  Contacts, dentures or bridgework may not be worn into surgery.  Leave suitcase in the car. After surgery it may be brought to your room.  For patients admitted to the hospital, checkout time is 11:00 AM the day of discharge.    Please read over the following fact sheets that you were given: MRSA Information, blood fact sheet, incentive spirometry fact sheet Birdie Sons, RN  pre op nurse call if needed [phone]    FAILURE TO FOLLOW THESE INSTRUCTIONS MAY RESULT IN CANCELLATION OF YOUR SURGERY   Patient Signature: ___________________________________________

## 2013-09-16 NOTE — Progress Notes (Signed)
EKG 11/07/12 on chart, Chest x-ray 04/01/13 on EPIC

## 2013-09-21 ENCOUNTER — Other Ambulatory Visit: Payer: Self-pay | Admitting: Orthopedic Surgery

## 2013-09-21 NOTE — H&P (Signed)
Kelly Taylor  DOB: 08/13/1951 Married / Language: English / Race: White Female  Date of Admission:  09/22/2013  Chief Complaint:  Left Knee Pain  History of Present Illness The patient is a 62 year old female who comes in for a preoperative History and Physical. The patient is scheduled for a left total knee arthroplasty to be performed by Dr. Frank V. Aluisio, [facility] at  [facility] on 09/22/2013. The patient is a 62 year old female presenting for a post-operative visit. The patient comes in S/P right total knee arthroplasty. The patient states that she is doing very well at this time. The pain is under excellent control at this time and describe their pain as mild. They are currently on Hydrocodone (before therapy and then the muscle relaxer) and Ultram (if she has breakthrough pain) for their pain. The patient is currently doing outpatient physical therapy. The patient feels that they are progressing well (pt is still having some numbness and is wondering when the burising will go away) at this time. The patient says the right knee is doing great at this time. The only thing bothering her now is the left knee. She feels like that is limiting her from doing anything. The left knee pain is current at all times. It is as bad as the right knee was prior to surgery. She is back at work now, but it is difficult to do so because of the left knee. She is now ready to get the left knee fixed. They have been treated conservatively in the past for the above stated problem and despite conservative measures, they continue to have progressive pain and severe functional limitations and dysfunction. They have failed non-operative management including home exercise, medications. It is felt that they would benefit from undergoing total joint replacement. Risks and benefits of the procedure have been discussed with the patient and they elect to proceed with surgery. There are no active  contraindications to surgery such as ongoing infection or rapidly progressive neurological disease.   Problem List Acute postoperative pain of right knee (719.46) Primary osteoarthritis of one knee (715.16)    Allergies Sulfanilamide *CHEMICALS* Relafen *ANALGESICS - ANTI-INFLAMMATORY* Penicillin VK *PENICILLINS* OxyCODONE HCl *ANALGESICS - OPIOID*. moderate sedation    Family History Osteoarthritis. mother and grandmother mothers side Cancer. mother and grandmother mothers side Liver Disease, Chronic. grandmother mothers side and grandmother fathers side Hypertension. father, brother and grandfather fathers side Diabetes Mellitus. mother and grandmother mothers side Congestive Heart Failure. grandmother mothers side Cerebrovascular Accident. grandmother mothers side and grandfather fathers side Heart Disease. mother and grandmother mothers side Drug / Alcohol Addiction. grandmother mothers side and grandmother fathers side Heart disease in female family member before age 65    Social History Alcohol use. current drinker; only occasionally per week Drug/Alcohol Rehab (Currently). no Illicit drug use. no Current work status. working full time Children. 0 Number of flights of stairs before winded. 1 Marital status. married Tobacco use. never smoker Exercise. Exercises weekly; does other Living situation. 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 yes Previously in rehab. no    Medication History TraMADol HCl (50MG Tablet, 1-2 Tablet Oral every 6-8 hours as needed for pain, Taken starting 07/11/2013) Active. (faxed to [facility] [phone]) Multivitamins ( Oral) Active. Robaxin (500MG Tablet, Oral) Active. Synthroid ( Oral) Specific dose unknown - Active. VESIcare (10MG Tablet, Oral) Active. Victoza (18MG/3ML Solution, Subcutaneous)  Active.   Calcium 600 ( Oral) Specific dose unknown - Active. Aspirin Childrens (81MG Tablet Chewable, Oral) Active. Amitiza (24MCG Capsule, Oral) Active.   Past Surgical History Gallbladder Surgery. open Foot Surgery. right Hysterectomy. partial (non-cancerous) Rotator Cuff Repair. right Mammoplasty; Reduction. bilateral Arthroscopy of Knee. bilateral Appendectomy Breast Biopsy. right Dilation and Curettage of Uterus - Multiple Tonsillectomy Colon Polyp Removal - Colonoscopy   Medical History Hemorrhoids Urinary Incontinence Vertigo Hiatal Hernia Irritable bowel syndrome Menopause Obesity Rubella Scarlet Fever Chronic Pain Diabetes Mellitus, Type II Gastroesophageal Reflux Disease Degenerative Disc Disease Measles Mumps Impaired Vision. wears glasses    Review of Systems General:Not Present- Chills, Fever, Night Sweats, Fatigue, Weight Gain, Weight Loss and Memory Loss. Skin:Not Present- Hives, Itching, Rash, Eczema and Lesions. HEENT:Not Present- Tinnitus, Headache, Double Vision, Visual Loss, Hearing Loss and Dentures. Respiratory:Not Present- Shortness of breath with exertion, Shortness of breath at rest, Allergies, Coughing up blood and Chronic Cough. Cardiovascular:Not Present- Chest Pain, Racing/skipping heartbeats, Difficulty Breathing Lying Down, Murmur, Swelling and Palpitations. Gastrointestinal:Present- Constipation. Not Present- Bloody Stool, Heartburn, Abdominal Pain, Vomiting, Nausea, Diarrhea, Difficulty Swallowing, Jaundice and Loss of appetitie. Female Genitourinary:Not Present- Blood in Urine, Urinary frequency, Weak urinary stream, Discharge, Flank Pain, Incontinence, Painful Urination, Urgency, Urinary Retention and Urinating at Night. Musculoskeletal:Present- Joint Swelling, Joint Pain, Back Pain and Spasms. Not Present- Muscle Weakness, Muscle Pain and Morning Stiffness. Neurological:Not Present- Tremor, Dizziness, Blackout  spells, Paralysis, Difficulty with balance and Weakness. Psychiatric:Not Present- Insomnia.    Vitals Weight: 270 lb Height: 68 in Weight was reported by patient. Height was reported by patient. Body Surface Area: 2.42 m Body Mass Index: 41.05 kg/m Pulse: 88 (Regular) Resp.: 16 (Unlabored) BP: 112/62 (Sitting, Left Arm, Standard)     Physical Exam The physical exam findings are as follows:  Note: Patient is a 61 year old female with continued bilateral knee pain.   General Mental Status - Alert, cooperative and good historian. General Appearance- pleasant. Not in acute distress. Orientation- Oriented X3. Build & Nutrition- Well nourished and Well developed.   Head and Neck Head- normocephalic, atraumatic . Neck Global Assessment- supple. no bruit auscultated on the right and no bruit auscultated on the left.   Eye Pupil- Bilateral- Regular and Round. Motion- Bilateral- EOMI.   Chest and Lung Exam Auscultation: Breath sounds:- clear at anterior chest wall and - clear at posterior chest wall. Adventitious sounds:- No Adventitious sounds.   Cardiovascular Auscultation:Rhythm- Regular rate and rhythm. Heart Sounds- S1 WNL and S2 WNL. Murmurs & Other Heart Sounds:Auscultation of the heart reveals - No Murmurs.   Abdomen Inspection:Contour- Generalized moderate distention. Palpation/Percussion:Tenderness- Abdomen is non-tender to palpation. Rigidity (guarding)- Abdomen is soft. Auscultation:Auscultation of the abdomen reveals - Bowel sounds normal.   Female Genitourinary  Not done, not pertinent to present illness  Musculoskeletal On exam well developed female, alert and oriented in no apparent distress. The right knee looks fantastic. There is no swelling. Range 0 to 116 degrees. There is no tenderness or in stability.  The left knee no effusion. Range 5 to 125. Marked crepitus on range of motion. Tender  medial greater than lateral. No instability.  RADIOGRAPHS: X rays from earlier this year on the left knee show tricompartment bone on bone arthritis in both knees.  Assessment & Plan Primary osteoarthritis of one knee (715.16) Impression: Left Knee  Note: Plan is for a Left Total Knee Replacement by Dr. Aluisio.  Plan is to go home  The patient does not have any contraindications and will receive TXA (  tranexamic acid) prior to surgery.  Signed electronically by Alexzandrew L Perkins, III PA-C  

## 2013-09-22 ENCOUNTER — Encounter (HOSPITAL_COMMUNITY): Payer: BC Managed Care – PPO | Admitting: Certified Registered Nurse Anesthetist

## 2013-09-22 ENCOUNTER — Inpatient Hospital Stay (HOSPITAL_COMMUNITY)
Admission: RE | Admit: 2013-09-22 | Discharge: 2013-09-24 | DRG: 470 | Disposition: A | Discharge status: Home Health | Payer: BC Managed Care – PPO | Source: Ambulatory Visit | Attending: Orthopedic Surgery | Admitting: Orthopedic Surgery

## 2013-09-22 ENCOUNTER — Inpatient Hospital Stay (HOSPITAL_COMMUNITY): Payer: BC Managed Care – PPO | Admitting: Certified Registered Nurse Anesthetist

## 2013-09-22 ENCOUNTER — Encounter (HOSPITAL_COMMUNITY)
Admission: RE | Disposition: A | Discharge status: Home Health | Payer: Self-pay | Source: Ambulatory Visit | Attending: Orthopedic Surgery

## 2013-09-22 ENCOUNTER — Encounter (HOSPITAL_COMMUNITY): Payer: Self-pay

## 2013-09-22 DIAGNOSIS — M549 Dorsalgia, unspecified: Secondary | ICD-10-CM | POA: Diagnosis present

## 2013-09-22 DIAGNOSIS — E039 Hypothyroidism, unspecified: Secondary | ICD-10-CM | POA: Diagnosis present

## 2013-09-22 DIAGNOSIS — G8929 Other chronic pain: Secondary | ICD-10-CM | POA: Diagnosis present

## 2013-09-22 DIAGNOSIS — K219 Gastro-esophageal reflux disease without esophagitis: Secondary | ICD-10-CM | POA: Diagnosis present

## 2013-09-22 DIAGNOSIS — E119 Type 2 diabetes mellitus without complications: Secondary | ICD-10-CM | POA: Diagnosis present

## 2013-09-22 DIAGNOSIS — Z6841 Body Mass Index (BMI) 40.0 and over, adult: Secondary | ICD-10-CM

## 2013-09-22 DIAGNOSIS — Z96659 Presence of unspecified artificial knee joint: Secondary | ICD-10-CM

## 2013-09-22 DIAGNOSIS — Z96652 Presence of left artificial knee joint: Secondary | ICD-10-CM

## 2013-09-22 DIAGNOSIS — D62 Acute posthemorrhagic anemia: Secondary | ICD-10-CM

## 2013-09-22 DIAGNOSIS — M1710 Unilateral primary osteoarthritis, unspecified knee: Principal | ICD-10-CM | POA: Diagnosis present

## 2013-09-22 DIAGNOSIS — Z01812 Encounter for preprocedural laboratory examination: Secondary | ICD-10-CM

## 2013-09-22 HISTORY — PX: TOTAL KNEE ARTHROPLASTY: SHX125

## 2013-09-22 LAB — GLUCOSE, CAPILLARY
Glucose-Capillary: 108 mg/dL — ABNORMAL HIGH (ref 70–99)
Glucose-Capillary: 73 mg/dL (ref 70–99)

## 2013-09-22 LAB — TYPE AND SCREEN
ABO/RH(D): B POS
Antibody Screen: NEGATIVE

## 2013-09-22 SURGERY — ARTHROPLASTY, KNEE, TOTAL
Anesthesia: Spinal | Site: Knee | Laterality: Left | Wound class: Clean

## 2013-09-22 MED ORDER — MORPHINE SULFATE 2 MG/ML IJ SOLN
1.0000 mg | INTRAMUSCULAR | Status: DC | PRN
  Administered 2013-09-22 (×2): 2 mg via INTRAVENOUS
  Administered 2013-09-23: 1 mg via INTRAVENOUS
  Filled 2013-09-22 (×3): qty 1

## 2013-09-22 MED ORDER — HYDROCODONE-ACETAMINOPHEN 7.5-325 MG PO TABS
1.0000 | ORAL_TABLET | ORAL | Status: DC | PRN
  Administered 2013-09-22 – 2013-09-24 (×8): 2 via ORAL
  Filled 2013-09-22 (×8): qty 2

## 2013-09-22 MED ORDER — PROPOFOL 10 MG/ML IV BOLUS
INTRAVENOUS | Status: DC | PRN
  Administered 2013-09-22 (×2): 20 mg via INTRAVENOUS
  Administered 2013-09-22: 40 mg via INTRAVENOUS

## 2013-09-22 MED ORDER — DARIFENACIN HYDROBROMIDE ER 15 MG PO TB24
15.0000 mg | ORAL_TABLET | Freq: Every day | ORAL | Status: DC
  Administered 2013-09-23 – 2013-09-24 (×2): 15 mg via ORAL
  Filled 2013-09-22 (×3): qty 1

## 2013-09-22 MED ORDER — TRANEXAMIC ACID 100 MG/ML IV SOLN
1000.0000 mg | INTRAVENOUS | Status: AC
  Administered 2013-09-22: 1000 mg via INTRAVENOUS
  Filled 2013-09-22: qty 10

## 2013-09-22 MED ORDER — DEXAMETHASONE 4 MG PO TABS
10.0000 mg | ORAL_TABLET | Freq: Every day | ORAL | Status: AC
  Administered 2013-09-23: 10 mg via ORAL
  Filled 2013-09-22: qty 1

## 2013-09-22 MED ORDER — KETAMINE HCL 10 MG/ML IJ SOLN
INTRAMUSCULAR | Status: DC | PRN
  Administered 2013-09-22: 20 mg via INTRAVENOUS

## 2013-09-22 MED ORDER — DOCUSATE SODIUM 100 MG PO CAPS
100.0000 mg | ORAL_CAPSULE | Freq: Two times a day (BID) | ORAL | Status: DC
  Administered 2013-09-22 – 2013-09-24 (×4): 100 mg via ORAL

## 2013-09-22 MED ORDER — ONDANSETRON HCL 4 MG/2ML IJ SOLN
INTRAMUSCULAR | Status: DC | PRN
  Administered 2013-09-22: 4 mg via INTRAVENOUS

## 2013-09-22 MED ORDER — SODIUM CHLORIDE 0.9 % IV SOLN: INTRAVENOUS | Status: DC

## 2013-09-22 MED ORDER — 0.9 % SODIUM CHLORIDE (POUR BTL) OPTIME
TOPICAL | Status: DC | PRN
  Administered 2013-09-22: 1000 mL

## 2013-09-22 MED ORDER — FLEET ENEMA 7-19 GM/118ML RE ENEM: 1.0000 | ENEMA | Freq: Once | RECTAL | Status: AC | PRN

## 2013-09-22 MED ORDER — DEXAMETHASONE SODIUM PHOSPHATE 10 MG/ML IJ SOLN: 10.0000 mg | Freq: Once | INTRAMUSCULAR | Status: DC

## 2013-09-22 MED ORDER — TRANEXAMIC ACID 100 MG/ML IV SOLN: 1000.0000 mg | INTRAVENOUS | Status: DC

## 2013-09-22 MED ORDER — LACTATED RINGERS IV SOLN
INTRAVENOUS | Status: DC
  Administered 2013-09-22: 1000 mL via INTRAVENOUS

## 2013-09-22 MED ORDER — SODIUM CHLORIDE 0.9 % IR SOLN
Status: DC | PRN
  Administered 2013-09-22: 1000 mL

## 2013-09-22 MED ORDER — SODIUM CHLORIDE 0.9 % IJ SOLN
INTRAMUSCULAR | Status: DC | PRN
  Administered 2013-09-22: 15:00:00

## 2013-09-22 MED ORDER — ACETAMINOPHEN 650 MG RE SUPP: 650.0000 mg | Freq: Four times a day (QID) | RECTAL | Status: DC | PRN

## 2013-09-22 MED ORDER — LEVOTHYROXINE SODIUM 25 MCG PO TABS
25.0000 ug | ORAL_TABLET | Freq: Every day | ORAL | Status: DC
  Administered 2013-09-23 – 2013-09-24 (×2): 25 ug via ORAL
  Filled 2013-09-22 (×3): qty 1

## 2013-09-22 MED ORDER — HYDROCODONE-ACETAMINOPHEN 7.5-325 MG PO TABS
1.0000 | ORAL_TABLET | Freq: Four times a day (QID) | ORAL | Status: DC | PRN
  Filled 2013-09-22: qty 2

## 2013-09-22 MED ORDER — METHOCARBAMOL 500 MG PO TABS
500.0000 mg | ORAL_TABLET | Freq: Four times a day (QID) | ORAL | Status: DC | PRN
  Administered 2013-09-23 – 2013-09-24 (×4): 500 mg via ORAL
  Filled 2013-09-22 (×4): qty 1

## 2013-09-22 MED ORDER — PANTOPRAZOLE SODIUM 40 MG PO TBEC
40.0000 mg | DELAYED_RELEASE_TABLET | Freq: Every day | ORAL | Status: DC
  Administered 2013-09-23: 40 mg via ORAL
  Filled 2013-09-22: qty 1

## 2013-09-22 MED ORDER — MIDAZOLAM HCL 5 MG/5ML IJ SOLN
INTRAMUSCULAR | Status: DC | PRN
  Administered 2013-09-22: 2 mg via INTRAVENOUS

## 2013-09-22 MED ORDER — MENTHOL 3 MG MT LOZG
1.0000 | LOZENGE | OROMUCOSAL | Status: DC | PRN
  Filled 2013-09-22: qty 9

## 2013-09-22 MED ORDER — METOCLOPRAMIDE HCL 5 MG/ML IJ SOLN: 5.0000 mg | Freq: Three times a day (TID) | INTRAMUSCULAR | Status: DC | PRN

## 2013-09-22 MED ORDER — FENTANYL CITRATE 0.05 MG/ML IJ SOLN
INTRAMUSCULAR | Status: DC | PRN
  Administered 2013-09-22 (×2): 50 ug via INTRAVENOUS

## 2013-09-22 MED ORDER — METOCLOPRAMIDE HCL 10 MG PO TABS: 5.0000 mg | ORAL_TABLET | Freq: Three times a day (TID) | ORAL | Status: DC | PRN

## 2013-09-22 MED ORDER — TRAMADOL HCL 50 MG PO TABS: 50.0000 mg | ORAL_TABLET | Freq: Four times a day (QID) | ORAL | Status: DC | PRN

## 2013-09-22 MED ORDER — ACETAMINOPHEN 325 MG PO TABS: 650.0000 mg | ORAL_TABLET | Freq: Four times a day (QID) | ORAL | Status: DC | PRN

## 2013-09-22 MED ORDER — DIPHENHYDRAMINE HCL 12.5 MG/5ML PO ELIX: 12.5000 mg | ORAL_SOLUTION | ORAL | Status: DC | PRN

## 2013-09-22 MED ORDER — LUBIPROSTONE 24 MCG PO CAPS
24.0000 ug | ORAL_CAPSULE | Freq: Two times a day (BID) | ORAL | Status: DC
  Administered 2013-09-23 – 2013-09-24 (×3): 24 ug via ORAL
  Filled 2013-09-22 (×5): qty 1

## 2013-09-22 MED ORDER — DEXAMETHASONE SODIUM PHOSPHATE 10 MG/ML IJ SOLN
INTRAMUSCULAR | Status: DC | PRN
  Administered 2013-09-22: 10 mg via INTRAVENOUS

## 2013-09-22 MED ORDER — PROPOFOL INFUSION 10 MG/ML OPTIME
INTRAVENOUS | Status: DC | PRN
  Administered 2013-09-22: 75 ug/kg/min via INTRAVENOUS

## 2013-09-22 MED ORDER — RIVAROXABAN 10 MG PO TABS
10.0000 mg | ORAL_TABLET | Freq: Every day | ORAL | Status: DC
  Administered 2013-09-23 – 2013-09-24 (×2): 10 mg via ORAL
  Filled 2013-09-22 (×3): qty 1

## 2013-09-22 MED ORDER — POTASSIUM CHLORIDE IN NACL 20-0.9 MEQ/L-% IV SOLN
INTRAVENOUS | Status: DC
  Administered 2013-09-22: 19:00:00 via INTRAVENOUS
  Filled 2013-09-22 (×2): qty 1000

## 2013-09-22 MED ORDER — PHENYLEPHRINE HCL 10 MG/ML IJ SOLN
INTRAMUSCULAR | Status: DC | PRN
  Administered 2013-09-22 (×4): 40 ug via INTRAVENOUS
  Administered 2013-09-22: 120 ug via INTRAVENOUS
  Administered 2013-09-22 (×3): 40 ug via INTRAVENOUS
  Administered 2013-09-22 (×3): 80 ug via INTRAVENOUS

## 2013-09-22 MED ORDER — DEXTROSE 5 % IV SOLN
500.0000 mg | Freq: Four times a day (QID) | INTRAVENOUS | Status: DC | PRN
  Administered 2013-09-22: 20:00:00 500 mg via INTRAVENOUS
  Filled 2013-09-22: qty 5

## 2013-09-22 MED ORDER — ACETAMINOPHEN 500 MG PO TABS
1000.0000 mg | ORAL_TABLET | Freq: Once | ORAL | Status: AC
  Administered 2013-09-22: 1000 mg via ORAL
  Filled 2013-09-22: qty 2

## 2013-09-22 MED ORDER — LIRAGLUTIDE 18 MG/3ML ~~LOC~~ SOPN
18.0000 mg | PEN_INJECTOR | Freq: Every day | SUBCUTANEOUS | Status: DC
  Administered 2013-09-23: 09:00:00 18 mg via SUBCUTANEOUS

## 2013-09-22 MED ORDER — VANCOMYCIN HCL IN DEXTROSE 1-5 GM/200ML-% IV SOLN
1000.0000 mg | Freq: Two times a day (BID) | INTRAVENOUS | Status: AC
  Administered 2013-09-23: 02:00:00 1000 mg via INTRAVENOUS
  Filled 2013-09-22: qty 200

## 2013-09-22 MED ORDER — VANCOMYCIN HCL 10 G IV SOLR
1500.0000 mg | INTRAVENOUS | Status: AC
  Administered 2013-09-22: 1500 mg via INTRAVENOUS
  Filled 2013-09-22: qty 1500

## 2013-09-22 MED ORDER — STERILE WATER FOR IRRIGATION IR SOLN
Status: DC | PRN
  Administered 2013-09-22: 3000 mL

## 2013-09-22 MED ORDER — POLYETHYLENE GLYCOL 3350 17 G PO PACK
17.0000 g | PACK | Freq: Every day | ORAL | Status: DC | PRN
  Administered 2013-09-24: 07:00:00 17 g via ORAL

## 2013-09-22 MED ORDER — ONDANSETRON HCL 4 MG/2ML IJ SOLN: 4.0000 mg | Freq: Four times a day (QID) | INTRAMUSCULAR | Status: DC | PRN

## 2013-09-22 MED ORDER — BUPIVACAINE LIPOSOME 1.3 % IJ SUSP
20.0000 mL | Freq: Once | INTRAMUSCULAR | Status: DC
  Filled 2013-09-22: qty 20

## 2013-09-22 MED ORDER — ONDANSETRON HCL 4 MG PO TABS: 4.0000 mg | ORAL_TABLET | Freq: Four times a day (QID) | ORAL | Status: DC | PRN

## 2013-09-22 MED ORDER — EPHEDRINE SULFATE 50 MG/ML IJ SOLN
INTRAMUSCULAR | Status: DC | PRN
  Administered 2013-09-22 (×3): 5 mg via INTRAVENOUS

## 2013-09-22 MED ORDER — FLUTICASONE PROPIONATE 50 MCG/ACT NA SUSP
2.0000 | Freq: Every day | NASAL | Status: DC | PRN
  Filled 2013-09-22: qty 16

## 2013-09-22 MED ORDER — ACETAMINOPHEN 500 MG PO TABS
1000.0000 mg | ORAL_TABLET | Freq: Four times a day (QID) | ORAL | Status: DC
  Administered 2013-09-22: 1000 mg via ORAL
  Filled 2013-09-22: qty 2

## 2013-09-22 MED ORDER — OXYCODONE HCL 5 MG PO TABS
5.0000 mg | ORAL_TABLET | ORAL | Status: DC | PRN
  Administered 2013-09-22: 20:00:00 10 mg via ORAL
  Filled 2013-09-22: qty 2

## 2013-09-22 MED ORDER — BUPIVACAINE IN DEXTROSE 0.75-8.25 % IT SOLN
INTRATHECAL | Status: DC | PRN
  Administered 2013-09-22: 2 mL via INTRATHECAL

## 2013-09-22 MED ORDER — DEXAMETHASONE SODIUM PHOSPHATE 10 MG/ML IJ SOLN
10.0000 mg | Freq: Every day | INTRAMUSCULAR | Status: AC
  Filled 2013-09-22: qty 1

## 2013-09-22 MED ORDER — HYDROMORPHONE HCL PF 1 MG/ML IJ SOLN: 0.2500 mg | INTRAMUSCULAR | Status: DC | PRN

## 2013-09-22 MED ORDER — LACTATED RINGERS IV SOLN
INTRAVENOUS | Status: DC
  Administered 2013-09-22: 1000 mL via INTRAVENOUS
  Administered 2013-09-22: 15:00:00 via INTRAVENOUS

## 2013-09-22 MED ORDER — BUPIVACAINE HCL 0.25 % IJ SOLN
INTRAMUSCULAR | Status: DC | PRN
  Administered 2013-09-22: 20 mL

## 2013-09-22 MED ORDER — PHENOL 1.4 % MT LIQD
1.0000 | OROMUCOSAL | Status: DC | PRN
  Filled 2013-09-22: qty 177

## 2013-09-22 MED ORDER — BISACODYL 10 MG RE SUPP: 10.0000 mg | Freq: Every day | RECTAL | Status: DC | PRN

## 2013-09-22 MED ORDER — INSULIN ASPART 100 UNIT/ML ~~LOC~~ SOLN: 0.0000 [IU] | Freq: Three times a day (TID) | SUBCUTANEOUS | Status: DC

## 2013-09-22 MED ORDER — BUPIVACAINE HCL (PF) 0.25 % IJ SOLN
INTRAMUSCULAR | Status: AC
  Filled 2013-09-22: qty 30

## 2013-09-22 MED ORDER — KETOROLAC TROMETHAMINE 15 MG/ML IJ SOLN: 7.5000 mg | Freq: Three times a day (TID) | INTRAMUSCULAR | Status: AC | PRN

## 2013-09-22 MED ORDER — SODIUM CHLORIDE 0.9 % IJ SOLN
INTRAMUSCULAR | Status: AC
  Filled 2013-09-22: qty 50

## 2013-09-22 SURGICAL SUPPLY — 59 items
BAG SPEC THK2 15X12 ZIP CLS (MISCELLANEOUS) ×1
BAG ZIPLOCK 12X15 (MISCELLANEOUS) ×2 IMPLANT
BANDAGE ELASTIC 6 VELCRO ST LF (GAUZE/BANDAGES/DRESSINGS) ×2 IMPLANT
BANDAGE ESMARK 6X9 LF (GAUZE/BANDAGES/DRESSINGS) ×1 IMPLANT
BLADE SAG 18X100X1.27 (BLADE) ×2 IMPLANT
BLADE SAW SGTL 11.0X1.19X90.0M (BLADE) ×2 IMPLANT
BNDG CMPR 9X6 STRL LF SNTH (GAUZE/BANDAGES/DRESSINGS) ×1
BNDG ESMARK 6X9 LF (GAUZE/BANDAGES/DRESSINGS) ×2
BOWL SMART MIX CTS (DISPOSABLE) ×2 IMPLANT
CAPT RP KNEE ×2 IMPLANT
CEMENT HV SMART SET (Cement) ×4 IMPLANT
CLOSURE STERI-STRIP 1/4X4 (GAUZE/BANDAGES/DRESSINGS) ×2 IMPLANT
CLOTH BEACON ORANGE TIMEOUT ST (SAFETY) ×2 IMPLANT
CUFF TOURN SGL QUICK 34 (TOURNIQUET CUFF) ×1
CUFF TRNQT CYL 34X4X40X1 (TOURNIQUET CUFF) ×1 IMPLANT
DECANTER SPIKE VIAL GLASS SM (MISCELLANEOUS) ×2 IMPLANT
DRAPE EXTREMITY T 121X128X90 (DRAPE) ×2 IMPLANT
DRAPE POUCH INSTRU U-SHP 10X18 (DRAPES) ×2 IMPLANT
DRAPE U-SHAPE 47X51 STRL (DRAPES) ×2 IMPLANT
DRSG ADAPTIC 3X8 NADH LF (GAUZE/BANDAGES/DRESSINGS) ×2 IMPLANT
DRSG PAD ABDOMINAL 8X10 ST (GAUZE/BANDAGES/DRESSINGS) ×2 IMPLANT
DURAPREP 26ML APPLICATOR (WOUND CARE) ×2 IMPLANT
ELECT REM PT RETURN 9FT ADLT (ELECTROSURGICAL) ×2
ELECTRODE REM PT RTRN 9FT ADLT (ELECTROSURGICAL) ×1 IMPLANT
EVACUATOR 1/8 PVC DRAIN (DRAIN) ×2 IMPLANT
FACESHIELD LNG OPTICON STERILE (SAFETY) ×10 IMPLANT
GLOVE BIO SURGEON STRL SZ7.5 (GLOVE) IMPLANT
GLOVE BIO SURGEON STRL SZ8 (GLOVE) ×2 IMPLANT
GLOVE BIOGEL PI IND STRL 8 (GLOVE) ×2 IMPLANT
GLOVE BIOGEL PI INDICATOR 8 (GLOVE) ×2
GLOVE SURG SS PI 6.5 STRL IVOR (GLOVE) IMPLANT
GOWN PREVENTION PLUS LG XLONG (DISPOSABLE) ×2 IMPLANT
GOWN STRL REIN XL XLG (GOWN DISPOSABLE) IMPLANT
HANDPIECE INTERPULSE COAX TIP (DISPOSABLE) ×2
HOVERMATT SINGLE USE (MISCELLANEOUS) ×2 IMPLANT
IMMOBILIZER KNEE 20 (SOFTGOODS) ×2
IMMOBILIZER KNEE 20 THIGH 36 (SOFTGOODS) ×1 IMPLANT
KIT BASIN OR (CUSTOM PROCEDURE TRAY) ×2 IMPLANT
MANIFOLD NEPTUNE II (INSTRUMENTS) ×2 IMPLANT
NDL SAFETY ECLIPSE 18X1.5 (NEEDLE) ×2 IMPLANT
NEEDLE HYPO 18GX1.5 SHARP (NEEDLE) ×4
NS IRRIG 1000ML POUR BTL (IV SOLUTION) ×2 IMPLANT
PACK TOTAL JOINT (CUSTOM PROCEDURE TRAY) ×2 IMPLANT
PADDING CAST COTTON 6X4 STRL (CAST SUPPLIES) ×4 IMPLANT
POSITIONER SURGICAL ARM (MISCELLANEOUS) ×2 IMPLANT
SET HNDPC FAN SPRY TIP SCT (DISPOSABLE) ×1 IMPLANT
SPONGE GAUZE 4X4 12PLY (GAUZE/BANDAGES/DRESSINGS) ×2 IMPLANT
STRIP CLOSURE SKIN 1/2X4 (GAUZE/BANDAGES/DRESSINGS) ×4 IMPLANT
SUCTION FRAZIER 12FR DISP (SUCTIONS) ×2 IMPLANT
SUT MNCRL AB 4-0 PS2 18 (SUTURE) ×2 IMPLANT
SUT VIC AB 2-0 CT1 27 (SUTURE) ×6
SUT VIC AB 2-0 CT1 TAPERPNT 27 (SUTURE) ×3 IMPLANT
SUT VLOC 180 0 24IN GS25 (SUTURE) ×2 IMPLANT
SYR 20CC LL (SYRINGE) ×2 IMPLANT
SYR 50ML LL SCALE MARK (SYRINGE) ×2 IMPLANT
TOWEL OR 17X26 10 PK STRL BLUE (TOWEL DISPOSABLE) ×4 IMPLANT
TRAY FOLEY CATH 14FRSI W/METER (CATHETERS) ×2 IMPLANT
WATER STERILE IRR 1500ML POUR (IV SOLUTION) ×2 IMPLANT
WRAP KNEE MAXI GEL POST OP (GAUZE/BANDAGES/DRESSINGS) ×2 IMPLANT

## 2013-09-22 NOTE — Transfer of Care (Signed)
Immediate Anesthesia Transfer of Care Note  Patient: Kelly Taylor  Procedure(s) Performed: Procedure(s): LEFT TOTAL KNEE ARTHROPLASTY (Left)  Patient Location: PACU  Anesthesia Type:MAC and Spinal  Level of Consciousness: awake, alert , oriented, patient cooperative and responds to stimulation  Airway & Oxygen Therapy: Patient Spontanous Breathing and Patient connected to face mask oxygen  Post-op Assessment: Report given to PACU RN and Post -op Vital signs reviewed and stable, spinal level L1  Post vital signs: Reviewed and stable  Complications: No apparent anesthesia complications

## 2013-09-22 NOTE — Anesthesia Procedure Notes (Addendum)
Spinal  Start time: 09/22/2013 2:05 PM End time: 09/22/2013 2:10 PM Staffing Anesthesiologist: Ronelle Nigh L CRNA/Resident: Lyda Kalata R Performed by: anesthesiologist and resident/CRNA  Preanesthetic Checklist Completed: patient identified, site marked, surgical consent, pre-op evaluation, timeout performed, IV checked, risks and benefits discussed and monitors and equipment checked Spinal Block Patient position: sitting Prep: ChloraPrep Patient monitoring: heart rate, blood pressure and continuous pulse ox Approach: midline Location: L3-4 Injection technique: single-shot Needle Needle type: Pencil-Tip  Needle gauge: 25 G Needle length: 12.7 cm Needle insertion depth: 11.5 cm Assessment Sensory level: T4 Additional Notes CSF return, no blood, injected without difficulty. No complication.

## 2013-09-22 NOTE — H&P (View-Only) (Signed)
Archie Endo  DOB: 09/22/1951 Married / Language: English / Race: White Female  Date of Admission:  09/22/2013  Chief Complaint:  Left Knee Pain  History of Present Illness The patient is a 62 year old female who comes in for a preoperative History and Physical. The patient is scheduled for a left total knee arthroplasty to be performed by Dr. Gus Rankin. Aluisio, MD at [facility] on 09/22/2013. The patient is a 62 year old female presenting for a post-operative visit. The patient comes in S/P right total knee arthroplasty. The patient states that she is doing very well at this time. The pain is under excellent control at this time and describe their pain as mild. They are currently on Hydrocodone (before therapy and then the muscle relaxer) and Ultram (if she has breakthrough pain) for their pain. The patient is currently doing outpatient physical therapy. The patient feels that they are progressing well (pt is still having some numbness and is wondering when the burising will go away) at this time. The patient says the right knee is doing great at this time. The only thing bothering her now is the left knee. She feels like that is limiting her from doing anything. The left knee pain is current at all times. It is as bad as the right knee was prior to surgery. She is back at work now, but it is difficult to do so because of the left knee. She is now ready to get the left knee fixed. They have been treated conservatively in the past for the above stated problem and despite conservative measures, they continue to have progressive pain and severe functional limitations and dysfunction. They have failed non-operative management including home exercise, medications. It is felt that they would benefit from undergoing total joint replacement. Risks and benefits of the procedure have been discussed with the patient and they elect to proceed with surgery. There are no active  contraindications to surgery such as ongoing infection or rapidly progressive neurological disease.   Problem List Acute postoperative pain of right knee (719.46) Primary osteoarthritis of one knee (715.16)    Allergies Sulfanilamide *CHEMICALS* Relafen *ANALGESICS - ANTI-INFLAMMATORY* Penicillin VK *PENICILLINS* OxyCODONE HCl *ANALGESICS - OPIOID*. moderate sedation    Family History Osteoarthritis. mother and grandmother mothers side Cancer. mother and grandmother mothers side Liver Disease, Chronic. grandmother mothers side and grandmother fathers side Hypertension. father, brother and grandfather fathers side Diabetes Mellitus. mother and grandmother mothers side Congestive Heart Failure. grandmother mothers side Cerebrovascular Accident. grandmother mothers side and grandfather fathers side Heart Disease. mother and grandmother mothers side Drug / Alcohol Addiction. grandmother mothers side and grandmother fathers side Heart disease in female family member before age 90    Social History Alcohol use. current drinker; only occasionally per week Drug/Alcohol Rehab (Currently). no Illicit drug use. no Current work status. working full time Children. 0 Number of flights of stairs before winded. 1 Marital status. married Tobacco use. never smoker Exercise. Exercises weekly; does other Living situation. 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 yes Previously in rehab. no    Medication History TraMADol HCl (50MG  Tablet, 1-2 Tablet Oral every 6-8 hours as needed for pain, Taken starting 07/11/2013) Active. (faxed to [facility] [phone]) Multivitamins ( Oral) Active. Robaxin (500MG  Tablet, Oral) Active. Synthroid ( Oral) Specific dose unknown - Active. VESIcare (10MG  Tablet, Oral) Active. Victoza (18MG /3ML Solution, Subcutaneous)  Active.  Calcium 600 ( Oral) Specific dose unknown - Active. Aspirin Childrens (81MG  Tablet Chewable, Oral) Active. Amitiza ( Capsule, Oral) Active.   Past Surgical History Gallbladder Surgery. open Foot Surgery. right Hysterectomy. partial (non-cancerous) Rotator Cuff Repair. right Mammoplasty; Reduction. bilateral Arthroscopy of Knee. bilateral Appendectomy Breast Biopsy. right Dilation and Curettage of Uterus - Multiple Tonsillectomy Colon Polyp Removal - Colonoscopy   Medical History Hemorrhoids Urinary Incontinence Vertigo Hiatal Hernia Irritable bowel syndrome Menopause Obesity Rubella Scarlet Fever Chronic Pain Diabetes Mellitus, Type II Gastroesophageal Reflux Disease Degenerative Disc Disease Measles Mumps Impaired Vision. wears glasses    Review of Systems General:Not Present- Chills, Fever, Night Sweats, Fatigue, Weight Gain, Weight Loss and Memory Loss. Skin:Not Present- Hives, Itching, Rash, Eczema and Lesions. HEENT:Not Present- Tinnitus, Headache, Double Vision, Visual Loss, Hearing Loss and Dentures. Respiratory:Not Present- Shortness of breath with exertion, Shortness of breath at rest, Allergies, Coughing up blood and Chronic Cough. Cardiovascular:Not Present- Chest Pain, Racing/skipping heartbeats, Difficulty Breathing Lying Down, Murmur, Swelling and Palpitations. Gastrointestinal:Present- Constipation. Not Present- Bloody Stool, Heartburn, Abdominal Pain, Vomiting, Nausea, Diarrhea, Difficulty Swallowing, Jaundice and Loss of appetitie. Female Genitourinary:Not Present- Blood in Urine, Urinary frequency, Weak urinary stream, Discharge, Flank Pain, Incontinence, Painful Urination, Urgency, Urinary Retention and Urinating at Night. Musculoskeletal:Present- Joint Swelling, Joint Pain, Back Pain and Spasms. Not Present- Muscle Weakness, Muscle Pain and Morning Stiffness. Neurological:Not Present- Tremor, Dizziness, Blackout  spells, Paralysis, Difficulty with balance and Weakness. Psychiatric:Not Present- Insomnia.    Vitals Weight: 270 lb Height: 68 in Weight was reported by patient. Height was reported by patient. Body Surface Area: 2.42 m Body Mass Index: 41.05 kg/m Pulse: 88 (Regular) Resp.: 16 (Unlabored) BP: 112/62 (Sitting, Left Arm, Standard)     Physical Exam The physical exam findings are as follows:  Note: Patient is a 62 year old female with continued bilateral knee pain.   General Mental Status - Alert, cooperative and good historian. General Appearance- pleasant. Not in acute distress. Orientation- Oriented X3. Build & Nutrition- Well nourished and Well developed.   Head and Neck Head- normocephalic, atraumatic . Neck Global Assessment- supple. no bruit auscultated on the right and no bruit auscultated on the left.   Eye Pupil- Bilateral- Regular and Round. Motion- Bilateral- EOMI.   Chest and Lung Exam Auscultation: Breath sounds:- clear at anterior chest wall and - clear at posterior chest wall. Adventitious sounds:- No Adventitious sounds.   Cardiovascular Auscultation:Rhythm- Regular rate and rhythm. Heart Sounds- S1 WNL and S2 WNL. Murmurs & Other Heart Sounds:Auscultation of the heart reveals - No Murmurs.   Abdomen Inspection:Contour- Generalized moderate distention. Palpation/Percussion:Tenderness- Abdomen is non-tender to palpation. Rigidity (guarding)- Abdomen is soft. Auscultation:Auscultation of the abdomen reveals - Bowel sounds normal.   Female Genitourinary  Not done, not pertinent to present illness  Musculoskeletal On exam well developed female, alert and oriented in no apparent distress. The right knee looks fantastic. There is no swelling. Range 0 to 116 degrees. There is no tenderness or in stability.  The left knee no effusion. Range 5 to 125. Marked crepitus on range of motion. Tender  medial greater than lateral. No instability.  RADIOGRAPHS: X rays from earlier this year on the left knee show tricompartment bone on bone arthritis in both knees.  Assessment & Plan Primary osteoarthritis of one knee (715.16) Impression: Left Knee  Note: Plan is for a Left Total Knee Replacement by Dr. Lequita Halt.  Plan is to go home  The patient does not have any contraindications and will receive TXA (  tranexamic acid) prior to surgery.  Signed electronically by Lauraine Rinne, III PA-C

## 2013-09-22 NOTE — Interval H&P Note (Signed)
History and Physical Interval Note:  09/22/2013 12:45 PM  Archie Endo  has presented today for surgery, with the diagnosis of Left Knee Osteoarthritis  The various methods of treatment have been discussed with the patient and family. After consideration of risks, benefits and other options for treatment, the patient has consented to  Procedure(s): LEFT TOTAL KNEE ARTHROPLASTY (Left) as a surgical intervention .  The patient's history has been reviewed, patient examined, no change in status, stable for surgery.  I have reviewed the patient's chart and labs.  Questions were answered to the patient's satisfaction.     Loanne Drilling

## 2013-09-22 NOTE — Interval H&P Note (Signed)
History and Physical Interval Note:  09/22/2013 12:52 PM  Kelly Taylor  has presented today for surgery, with the diagnosis of Left Knee Osteoarthritis  The various methods of treatment have been discussed with the patient and family. After consideration of risks, benefits and other options for treatment, the patient has consented to  Procedure(s): LEFT TOTAL KNEE ARTHROPLASTY (Left) as a surgical intervention .  The patient's history has been reviewed, patient examined, no change in status, stable for surgery.  I have reviewed the patient's chart and labs.  Questions were answered to the patient's satisfaction.     Loanne Drilling

## 2013-09-22 NOTE — Anesthesia Postprocedure Evaluation (Signed)
  Anesthesia Post-op Note  Patient: Kelly Taylor  Procedure(s) Performed: Procedure(s) (LRB): LEFT TOTAL KNEE ARTHROPLASTY (Left)  Patient Location: PACU  Anesthesia Type: Spinal  Level of Consciousness: awake and alert   Airway and Oxygen Therapy: Patient Spontanous Breathing  Post-op Pain: mild  Post-op Assessment: Post-op Vital signs reviewed, Patient's Cardiovascular Status Stable, Respiratory Function Stable, Patent Airway and No signs of Nausea or vomiting  Last Vitals:  Filed Vitals:   09/22/13 1042  BP: 152/90  Pulse: 89  Temp: 36.5 C  Resp: 20    Post-op Vital Signs: stable   Complications: No apparent anesthesia complications

## 2013-09-22 NOTE — Anesthesia Preprocedure Evaluation (Addendum)
Anesthesia Evaluation  Patient identified by MRN, date of birth, ID band Patient awake    Reviewed: Allergy & Precautions, H&P , NPO status , Patient's Chart, lab work & pertinent test results  Airway Mallampati: II TM Distance: >3 FB Neck ROM: full    Dental no notable dental hx. (+) Teeth Intact and Dental Advisory Given   Pulmonary neg pulmonary ROS,  breath sounds clear to auscultation  Pulmonary exam normal       Cardiovascular Exercise Tolerance: Good negative cardio ROS  Rhythm:regular Rate:Normal     Neuro/Psych negative neurological ROS  negative psych ROS   GI/Hepatic negative GI ROS, Neg liver ROS, GERD-  Medicated and Controlled,  Endo/Other  diabetes, Well Controlled, Type 2Hypothyroidism Morbid obesityBorderline diet controlled DM  Renal/GU negative Renal ROS  negative genitourinary   Musculoskeletal   Abdominal (+) + obese,   Peds  Hematology negative hematology ROS (+)   Anesthesia Other Findings   Reproductive/Obstetrics negative OB ROS                          Anesthesia Physical Anesthesia Plan  ASA: III  Anesthesia Plan: Spinal   Post-op Pain Management:    Induction:   Airway Management Planned: Simple Face Mask  Additional Equipment:   Intra-op Plan:   Post-operative Plan:   Informed Consent: I have reviewed the patients History and Physical, chart, labs and discussed the procedure including the risks, benefits and alternatives for the proposed anesthesia with the patient or authorized representative who has indicated his/her understanding and acceptance.   Dental Advisory Given  Plan Discussed with: CRNA and Surgeon  Anesthesia Plan Comments:        Anesthesia Quick Evaluation

## 2013-09-22 NOTE — Op Note (Signed)
Pre-operative diagnosis- Osteoarthritis  Left knee(s)  Post-operative diagnosis- Osteoarthritis Left knee(s)  Procedure-  Left  Total Knee Arthroplasty  Surgeon- Gus Rankin. Danell Verno, MD  Assistant- Avel Peace, PA-C   Anesthesia-  Spinal EBL-* No blood loss amount entered *  Drains Hemovac  Tourniquet time-  Total Tourniquet Time Documented: Thigh (Left) - 42 minutes Total: Thigh (Left) - 42 minutes    Complications- None  Condition-PACU - hemodynamically stable.   Brief Clinical Note  Kelly Taylor is a 62 y.o. year old female with end stage OA of her left knee with progressively worsening pain and dysfunction. She has constant pain, with activity and at rest and significant functional deficits with difficulties even with ADLs. She has had extensive non-op management including analgesics, injections of cortisone and viscosupplements, and home exercise program, but remains in significant pain with significant dysfunction. Radiographs show bone on bone arthritis medial and patellofemoral. She presents now for left Total Knee Arthroplasty.    Procedure in detail---   The patient is brought into the operating room and positioned supine on the operating table. After successful administration of  Spinal,   a tourniquet is placed high on the  Left thigh(s) and the lower extremity is prepped and draped in the usual sterile fashion. Time out is performed by the operating team and then the  Left lower extremity is wrapped in Esmarch, knee flexed and the tourniquet inflated to 300 mmHg.       A midline incision is made with a ten blade through the subcutaneous tissue to the level of the extensor mechanism. A fresh blade is used to make a medial parapatellar arthrotomy. Soft tissue over the proximal medial tibia is subperiosteally elevated to the joint line with a knife and into the semimembranosus bursa with a Cobb elevator. Soft tissue over the proximal lateral tibia is elevated with attention being  paid to avoiding the patellar tendon on the tibial tubercle. The patella is everted, knee flexed 90 degrees and the ACL and PCL are removed. Findings are bone on bone medial and patellofemoral with large global osteophytes and large loose bodies.        The drill is used to create a starting hole in the distal femur and the canal is thoroughly irrigated with sterile saline to remove the fatty contents. The 5 degree Left  valgus alignment guide is placed into the femoral canal and the distal femoral cutting block is pinned to remove 10 mm off the distal femur. Resection is made with an oscillating saw.      The tibia is subluxed forward and the menisci are removed. The extramedullary alignment guide is placed referencing proximally at the medial aspect of the tibial tubercle and distally along the second metatarsal axis and tibial crest. The block is pinned to remove 2mm off the more deficient medial  side. Resection is made with an oscillating saw. Size 3is the most appropriate size for the tibia and the proximal tibia is prepared with the modular drill and keel punch for that size.      The femoral sizing guide is placed and size 4 narrow is most appropriate. Rotation is marked off the epicondylar axis and confirmed by creating a rectangular flexion gap at 90 degrees. The size 4 cutting block is pinned in this rotation and the anterior, posterior and chamfer cuts are made with the oscillating saw. The intercondylar block is then placed and that cut is made.      Trial size 3  tibial component, trial size 4 narrow posterior stabilized femur and a 10  mm posterior stabilized rotating platform insert trial is placed. Full extension is achieved with excellent varus/valgus and anterior/posterior balance throughout full range of motion. The patella is everted and thickness measured to be 24  mm. Free hand resection is taken to 14 mm, a 38 template is placed, lug holes are drilled, trial patella is placed, and it  tracks normally. Osteophytes are removed off the posterior femur with the trial in place. All trials are removed and the cut bone surfaces prepared with pulsatile lavage. Cement is mixed and once ready for implantation, the size 3 tibial implant, size  4 narrow posterior stabilized femoral component, and the size 38 patella are cemented in place and the patella is held with the clamp. The trial insert is placed and the knee held in full extension. The Exparel (20 ml mixed with 30 ml saline) and .25% Bupivicaine, are injected into the extensor mechanism, posterior capsule, medial and lateral gutters and subcutaneous tissues.  All extruded cement is removed and once the cement is hard the permanent 10 mm posterior stabilized rotating platform insert is placed into the tibial tray.      The wound is copiously irrigated with saline solution and the extensor mechanism closed over a hemovac drain with #1 PDS suture. The tourniquet is released for a total tourniquet time of 42  minutes. Flexion against gravity is 135 degrees and the patella tracks normally. Subcutaneous tissue is closed with 2.0 vicryl and subcuticular with running 4.0 Monocryl. The incision is cleaned and dried and steri-strips and a bulky sterile dressing are applied. The limb is placed into a knee immobilizer and the patient is awakened and transported to recovery in stable condition.      Please note that a surgical assistant was a medical necessity for this procedure in order to perform it in a safe and expeditious manner. Surgical assistant was necessary to retract the ligaments and vital neurovascular structures to prevent injury to them and also necessary for proper positioning of the limb to allow for anatomic placement of the prosthesis.   Gus Rankin Sherod Cisse, MD    09/22/2013, 3:28 PM

## 2013-09-23 ENCOUNTER — Encounter (HOSPITAL_COMMUNITY): Payer: Self-pay | Admitting: Orthopedic Surgery

## 2013-09-23 LAB — GLUCOSE, CAPILLARY
Glucose-Capillary: 162 mg/dL — ABNORMAL HIGH (ref 70–99)
Glucose-Capillary: 129 mg/dL — ABNORMAL HIGH (ref 70–99)
Glucose-Capillary: 158 mg/dL — ABNORMAL HIGH (ref 70–99)
Glucose-Capillary: 167 mg/dL — ABNORMAL HIGH (ref 70–99)

## 2013-09-23 LAB — BASIC METABOLIC PANEL
BUN: 11 mg/dL (ref 6–23)
Calcium: 8.8 mg/dL (ref 8.4–10.5)
Creatinine, Ser: 0.53 mg/dL (ref 0.50–1.10)
GFR calc Af Amer: >90 mL/min (ref >90)
GFR calc non Af Amer: >90 mL/min (ref >90)
Glucose, Bld: 174 mg/dL — ABNORMAL HIGH (ref 70–99)
Potassium: 4.3 mEq/L (ref 3.5–5.1)
Sodium: 138 mEq/L (ref 135–145)

## 2013-09-23 LAB — CBC
Hemoglobin: 10.9 g/dL — ABNORMAL LOW (ref 12.0–15.0)
MCHC: 32.4 g/dL (ref 30.0–36.0)
MCV: 85.3 fL (ref 78.0–100.0)
RDW: 13.8 % (ref 11.5–15.5)

## 2013-09-23 MED ORDER — OMEPRAZOLE 20 MG PO CPDR
20.0000 mg | DELAYED_RELEASE_CAPSULE | Freq: Every day | ORAL | Status: DC
  Administered 2013-09-23 – 2013-09-24 (×2): 20 mg via ORAL
  Filled 2013-09-23 (×2): qty 1

## 2013-09-23 MED ORDER — LIRAGLUTIDE 18 MG/3ML ~~LOC~~ SOPN
1.8000 mg | PEN_INJECTOR | Freq: Every day | SUBCUTANEOUS | Status: DC
  Administered 2013-09-24: 08:00:00 1.8 mg via SUBCUTANEOUS

## 2013-09-23 NOTE — Progress Notes (Signed)
   Subjective: 1 Day Post-Op Procedure(s) (LRB): LEFT TOTAL KNEE ARTHROPLASTY (Left) Patient reports pain as moderate.   Patient seen in rounds with Dr. Lequita Halt. Patient is having problems with pain in the knee and thigh, requiring pain medications.  Biggest complaint was the thigh from the tourniquet. We will start therapy today.  Plan is to go Home after hospital stay.  Objective: Vital signs in last 24 hours: Temp:  [97.5 F (36.4 C)-98.4 F (36.9 C)] 97.5 F (36.4 C) (11/04 0916) Pulse Rate:  [61-94] 81 (11/04 0916) Resp:  [13-20] 16 (11/04 0916) BP: (96-152)/(63-90) 117/71 mmHg (11/04 0916) SpO2:  [92 %-100 %] 96 % (11/04 0916) Weight:  [127.9 kg (281 lb 15.5 oz)] 127.9 kg (281 lb 15.5 oz) (11/03 1715)  Intake/Output from previous day:  Intake/Output Summary (Last 24 hours) at 09/23/13 1035 Last data filed at 09/23/13 0900  Gross per 24 hour  Intake   4105 ml  Output   3631 ml  Net    474 ml    Intake/Output this shift: 1850 since MN +474 - Diuresing fluids well.  Labs:  Recent Labs  09/23/13 0446  HGB 10.9*    Recent Labs  09/23/13 0446  WBC 10.6*  RBC 3.94  HCT 33.6*  PLT 191    Recent Labs  09/23/13 0446  NA 138  K 4.3  CL 105  CO2 25  BUN 11  CREATININE 0.53  GLUCOSE 174*  CALCIUM 8.8   No results found for this basename: LABPT, INR,  in the last 72 hours  EXAM General - Patient is Alert, Appropriate and Oriented Extremity - Neurovascular intact Sensation intact distally Dorsiflexion/Plantar flexion intact Dressing - dressing C/D/I Motor Function - intact, moving foot and toes well on exam.  Hemovac pulled without difficulty.  Past Medical History  Diagnosis Date  . Arthritis   . Hx of bladder infections   . History of measles, mumps, or rubella   . History of chicken pox   . Trichomonas   . Yeast in stool   . Menopausal symptoms 2003  . Breast mass, left 2003  . Urge incontinence 2005  . Obesity, morbid 2006  .  Hypothyroidism   . Chronic back pain     seeing Pain specialist  . Hemorrhoids   . Polyp of colon     removed during colonoscopy  . Vertigo     over a year was last episode  . Irritable bowel syndrome   . H/O scarlet fever   . GERD (gastroesophageal reflux disease)   . Diabetes mellitus     borderline  . DDD (degenerative disc disease)     Assessment/Plan: 1 Day Post-Op Procedure(s) (LRB): LEFT TOTAL KNEE ARTHROPLASTY (Left) Active Problems:   * No active hospital problems. *  Estimated body mass index is 42.73 kg/(m^2) as calculated from the following:   Height as of this encounter: 5' 8.11" (1.73 m).   Weight as of this encounter: 127.9 kg (281 lb 15.5 oz). Advance diet Up with therapy Plan for discharge tomorrow Discharge home with home health  DVT Prophylaxis - Xarelto Weight-Bearing as tolerated to left leg D/C O2 and Pulse OX and try on Room Air  Normal Recinos 09/23/2013, 10:35 AM

## 2013-09-23 NOTE — Evaluation (Signed)
Physical Therapy Evaluation Patient Details Name: Kelly Taylor MRN: [id number] DOB: [birth year] Today's Date: 09/23/2013 Time: 4098-1191 PT Time Calculation (min): 17 min  PT Assessment / Plan / Recommendation History of Present Illness  62 yo female s/p L TKA 11/3.   Clinical Impression  On eval, pt required Min assist for mobility-able to ambulate ~60 feet with RW. Anticipate pt should progress well during stay. Recommend HHPT.    PT Assessment  Patient needs continued PT services    Follow Up Recommendations  Home health PT    Does the patient have the potential to tolerate intense rehabilitation      Barriers to Discharge        Equipment Recommendations  None recommended by PT    Recommendations for Other Services     Frequency 7X/week    Precautions / Restrictions Precautions Precautions: Fall;Knee Restrictions Weight Bearing Restrictions: No LLE Weight Bearing: Weight bearing as tolerated   Pertinent Vitals/Pain 7/10 L knee with activity. Ice applied end of session      Mobility  Bed Mobility Bed Mobility: Supine to Sit;Sit to Supine Supine to Sit: 4: Min assist Transfers Transfers: Sit to Stand;Stand to Sit Sit to Stand: 4: Min assist;From bed;From chair/3-in-1 Stand to Sit: 4: Min assist;To chair/3-in-1 Details for Transfer Assistance: x 2. VCs safety, technique, hand placement. Assist to rise, stabilize, control descent.  Ambulation/Gait Ambulation/Gait Assistance: 4: Min assist Ambulation Distance (Feet): 60 Feet Assistive device: Rolling walker Ambulation/Gait Assistance Details: VCS safety, sequence, distance from RW. Pt tends to push walker too far ahead. Maintains flexed posturing.  Gait Pattern: Step-to pattern;Decreased stride length;Antalgic;Trunk flexed    Exercises     PT Diagnosis: Difficulty walking;Abnormality of gait;Acute pain  PT Problem List: Decreased strength;Decreased range of motion;Decreased activity tolerance;Decreased  balance;Decreased mobility;Pain;Decreased knowledge of precautions;Decreased knowledge of use of DME PT Treatment Interventions: DME instruction;Gait training;Stair training;Functional mobility training;Therapeutic activities;Therapeutic exercise;Patient/family education     PT Goals(Current goals can be found in the care plan section) Acute Rehab PT Goals Patient Stated Goal: home soon. regain independence PT Goal Formulation: With patient Time For Goal Achievement: 10/07/13 Potential to Achieve Goals: Good  Visit Information  Last PT Received On: 09/23/13 Assistance Needed: +1 History of Present Illness: 62 yo female s/p L TKA 11/3.        Prior Functioning  Home Living Family/patient expects to be discharged to:: Private residence Living Arrangements: Spouse/significant other Available Help at Discharge: Family;Available 24 hours/day Type of Home: House Home Access: Stairs to enter [employer] of Steps: 3 Entrance Stairs-Rails: None Home Layout: One level Home Equipment: Walker - 2 wheels;Cane - single point Prior Function Level of Independence: Independent Communication Communication: No difficulties    Cognition  Cognition Arousal/Alertness: Awake/alert Behavior During Therapy: WFL for tasks assessed/performed Overall Cognitive Status: Within Functional Limits for tasks assessed    Extremity/Trunk Assessment Upper Extremity Assessment Upper Extremity Assessment: Overall WFL for tasks assessed Lower Extremity Assessment Lower Extremity Assessment: LLE deficits/detail LLE Deficits / Details: hip flex 2/5, hip abd/add 2/5, moves ankle well LLE: Unable to fully assess due to pain Cervical / Trunk Assessment Cervical / Trunk Assessment: Normal   Balance    End of Session PT - End of Session Activity Tolerance: Patient limited by pain;Patient limited by fatigue Patient left: in chair;with call bell/phone within reach CPM Left Knee CPM Left Knee: Off   GP     Rebeca Alert, MPT Pager: [phone]

## 2013-09-23 NOTE — Progress Notes (Signed)
OT Cancellation Note  Patient Details Name: JULIENNE VOGLER MRN: [id number] DOB: 04-29-1951   Cancelled Treatment:     Pt screened for OT.  Husband is getting her a 3:1; she has a shower bench, and he will assist with adls.  Will sign off.   Maleyah Evans 09/23/2013, 12:01 PM Marica Otter, OTR/L [phone] 09/23/2013

## 2013-09-23 NOTE — Progress Notes (Signed)
Physical Therapy Treatment Patient Details Name: ARIEN MORINE MRN: [id number] DOB: May 14, 1951 Today's Date: 09/23/2013 Time: 4098-1191 PT Time Calculation (min): 24 min  PT Assessment / Plan / Recommendation  History of Present Illness 62 yo female s/p L TKA 11/3.    PT Comments   Progressing with mobility. Possible d/c on tomorrow. Will plan to practice steps.   Follow Up Recommendations  Home health PT     Does the patient have the potential to tolerate intense rehabilitation     Barriers to Discharge        Equipment Recommendations  None recommended by PT    Recommendations for Other Services    Frequency 7X/week   Progress towards PT Goals Progress towards PT goals: Progressing toward goals  Plan Current plan remains appropriate    Precautions / Restrictions Precautions Precautions: Fall;Knee Restrictions Weight Bearing Restrictions: No LLE Weight Bearing: Weight bearing as tolerated   Pertinent Vitals/Pain 7/10 L knee with activity. Ice applied end of session    Mobility  Bed Mobility Bed Mobility: Sit to Supine Sit to Supine: 4: Min assist Details for Bed Mobility Assistance: Assist for L LE Transfers Transfers: Sit to Stand;Stand to Sit Sit to Stand: 4: Min assist;From chair/3-in-1;From toilet Stand to Sit: 4: Min guard;To toilet;To bed Details for Transfer Assistance: x 2. VCs safety, technique, hand placement. Assist to rise, stabilize. Therapist educated pt on proper/safe technique for using walker to stand/sit however pt prefers to perform "her way" with 1 foot/leg outside of walker and pulling up on walker with hand on opposite handle Ambulation/Gait Ambulation/Gait Assistance: 4: Min guard Ambulation Distance (Feet): 150 Feet Assistive device: Rolling walker Ambulation/Gait Assistance Details: VCs safety, distance from walker, posture. Pt tends to push walker too far ahead and maintains flexed posturing Gait Pattern: Step-to pattern;Decreased stride  length;Antalgic;Trunk flexed    Exercises Total Joint Exercises Ankle Circles/Pumps: AROM;Both;15 reps;Supine Quad Sets: AROM;Left;10 reps;Supine Heel Slides: AAROM;Left;10 reps;Supine Hip ABduction/ADduction: AAROM;Left;10 reps;Supine Straight Leg Raises: AAROM;Left;10 reps;Supine   PT Diagnosis:    PT Problem List:   PT Treatment Interventions:     PT Goals (current goals can now be found in the care plan section)    Visit Information  Last PT Received On: 09/23/13 Assistance Needed: +1 History of Present Illness: 62 yo female s/p L TKA 11/3.     Subjective Data      Cognition       Balance     End of Session PT - End of Session Activity Tolerance: Patient tolerated treatment well Patient left: in bed;with call bell/phone within reach   GP     Rebeca Alert, MPT Pager: [phone]

## 2013-09-24 LAB — BASIC METABOLIC PANEL
GFR calc Af Amer: >90 mL/min (ref >90)
GFR calc non Af Amer: >90 mL/min (ref >90)
Potassium: 4.1 mEq/L (ref 3.5–5.1)
Sodium: 138 mEq/L (ref 135–145)

## 2013-09-24 LAB — CBC
Hemoglobin: 10.1 g/dL — ABNORMAL LOW (ref 12.0–15.0)
MCHC: 32.5 g/dL (ref 30.0–36.0)
MCV: 85.7 fL (ref 78.0–100.0)
Platelets: 185 10*3/uL (ref 150–400)
RDW: 14.1 % (ref 11.5–15.5)

## 2013-09-24 LAB — GLUCOSE, CAPILLARY
Glucose-Capillary: 109 mg/dL — ABNORMAL HIGH (ref 70–99)
Glucose-Capillary: 90 mg/dL (ref 70–99)

## 2013-09-24 MED ORDER — TRAMADOL HCL 50 MG PO TABS: 50.0000 mg | ORAL_TABLET | Freq: Three times a day (TID) | ORAL | Status: DC | PRN

## 2013-09-24 MED ORDER — METHOCARBAMOL 500 MG PO TABS: 500.0000 mg | ORAL_TABLET | Freq: Four times a day (QID) | ORAL | Status: DC | PRN

## 2013-09-24 MED ORDER — RIVAROXABAN 10 MG PO TABS: 10.0000 mg | ORAL_TABLET | Freq: Every day | ORAL | Status: DC

## 2013-09-24 MED ORDER — HYDROCODONE-ACETAMINOPHEN 7.5-325 MG PO TABS: 1.0000 | ORAL_TABLET | ORAL | Status: DC | PRN

## 2013-09-24 MED ORDER — OXYCODONE HCL 5 MG PO TABS: 5.0000 mg | ORAL_TABLET | ORAL | Status: DC | PRN

## 2013-09-24 NOTE — Discharge Summary (Signed)
Physician Discharge Summary   Patient ID: Kelly Taylor MRN: [id number] DOB/AGE: 20-Mar-1951 62 y.o.  Admit date: 09/22/2013 Discharge date: 09/24/2013  Primary Diagnosis:  Osteoarthritis Left knee(s)  Admission Diagnoses:  Past Medical History  Diagnosis Date  . Arthritis   . Hx of bladder infections   . History of measles, mumps, or rubella   . History of chicken pox   . Trichomonas   . Yeast in stool   . Menopausal symptoms 2003  . Breast mass, left 2003  . Urge incontinence 2005  . Obesity, morbid 2006  . Hypothyroidism   . Chronic back pain     seeing Pain specialist  . Hemorrhoids   . Polyp of colon     removed during colonoscopy  . Vertigo     over a year was last episode  . Irritable bowel syndrome   . H/O scarlet fever   . GERD (gastroesophageal reflux disease)   . Diabetes mellitus     borderline  . DDD (degenerative disc disease)    Discharge Diagnoses:   Active Problems:   * No active hospital problems. *  Estimated body mass index is 42.73 kg/(m^2) as calculated from the following:   Height as of this encounter: 5' 8.11" (1.73 m).   Weight as of this encounter: 127.9 kg (281 lb 15.5 oz).  Procedure:  Procedure(s) (LRB): LEFT TOTAL KNEE ARTHROPLASTY (Left)   Consults: None  HPI: Kelly Taylor is a 62 y.o. year old female with end stage OA of her left knee with progressively worsening pain and dysfunction. She has constant pain, with activity and at rest and significant functional deficits with difficulties even with ADLs. She has had extensive non-op management including analgesics, injections of cortisone and viscosupplements, and home exercise program, but remains in significant pain with significant dysfunction. Radiographs show bone on bone arthritis medial and patellofemoral. She presents now for left Total Knee Arthroplasty.   Laboratory Data: Admission on 09/22/2013, Discharged on 09/24/2013  Component Date Value Range Status  .  Glucose-Capillary 09/22/2013 108* 70 - 99 mg/dL Final  . Glucose-Capillary 09/22/2013 73  70 - 99 mg/dL Final  . Comment 1 40/98/1191 Documented in Chart   Final  . WBC 09/23/2013 10.6* 4.0 - 10.5 K/uL Final  . RBC 09/23/2013 3.94  3.87 - 5.11 MIL/uL Final  . Hemoglobin 09/23/2013 10.9* 12.0 - 15.0 g/dL Final  . HCT 47/82/9562 33.6* 36.0 - 46.0 % Final  . MCV 09/23/2013 85.3  78.0 - 100.0 fL Final  . MCH 09/23/2013 27.7  26.0 - 34.0 pg Final  . MCHC 09/23/2013 32.4  30.0 - 36.0 g/dL Final  . RDW 13/06/6577 13.8  11.5 - 15.5 % Final  . Platelets 09/23/2013 191  150 - 400 K/uL Final  . Sodium 09/23/2013 138  135 - 145 mEq/L Final  . Potassium 09/23/2013 4.3  3.5 - 5.1 mEq/L Final  . Chloride 09/23/2013 105  96 - 112 mEq/L Final  . CO2 09/23/2013 25  19 - 32 mEq/L Final  . Glucose, Bld 09/23/2013 174* 70 - 99 mg/dL Final  . BUN 46/96/2952 11  6 - 23 mg/dL Final  . Creatinine, Ser 09/23/2013 0.53  0.50 - 1.10 mg/dL Final  . Calcium 84/13/2440 8.8  8.4 - 10.5 mg/dL Final  . GFR calc non Af Amer 09/23/2013 >90  >90 mL/min Final  . GFR calc Af Amer 09/23/2013 >90  >90 mL/min Final   Comment: (NOTE)  The eGFR has been calculated using the CKD EPI equation.                          This calculation has not been validated in all clinical situations.                          eGFR's persistently <90 mL/min signify possible Chronic Kidney                          Disease.  . Glucose-Capillary 09/22/2013 110* 70 - 99 mg/dL Final  . Glucose-Capillary 09/22/2013 162* 70 - 99 mg/dL Final  . Comment 1 16/08/9603 Notify RN   Final  . Glucose-Capillary 09/23/2013 131* 70 - 99 mg/dL Final  . Glucose-Capillary 09/23/2013 129* 70 - 99 mg/dL Final  . Glucose-Capillary 09/23/2013 158* 70 - 99 mg/dL Final  . WBC 54/07/8118 11.4* 4.0 - 10.5 K/uL Final  . RBC 09/24/2013 3.63* 3.87 - 5.11 MIL/uL Final  . Hemoglobin 09/24/2013 10.1* 12.0 - 15.0 g/dL Final  . HCT 14/78/2956 31.1* 36.0 -  46.0 % Final  . MCV 09/24/2013 85.7  78.0 - 100.0 fL Final  . MCH 09/24/2013 27.8  26.0 - 34.0 pg Final  . MCHC 09/24/2013 32.5  30.0 - 36.0 g/dL Final  . RDW 21/30/8657 14.1  11.5 - 15.5 % Final  . Platelets 09/24/2013 185  150 - 400 K/uL Final  . Sodium 09/24/2013 138  135 - 145 mEq/L Final  . Potassium 09/24/2013 4.1  3.5 - 5.1 mEq/L Final  . Chloride 09/24/2013 104  96 - 112 mEq/L Final  . CO2 09/24/2013 26  19 - 32 mEq/L Final  . Glucose, Bld 09/24/2013 140* 70 - 99 mg/dL Final  . BUN 84/69/6295 12  6 - 23 mg/dL Final  . Creatinine, Ser 09/24/2013 0.65  0.50 - 1.10 mg/dL Final  . Calcium 28/41/3244 9.0  8.4 - 10.5 mg/dL Final  . GFR calc non Af Amer 09/24/2013 >90  >90 mL/min Final  . GFR calc Af Amer 09/24/2013 >90  >90 mL/min Final   Comment: (NOTE)                          The eGFR has been calculated using the CKD EPI equation.                          This calculation has not been validated in all clinical situations.                          eGFR's persistently <90 mL/min signify possible Chronic Kidney                          Disease.  . Glucose-Capillary 09/23/2013 167* 70 - 99 mg/dL Final  . Glucose-Capillary 09/24/2013 109* 70 - 99 mg/dL Final  . Glucose-Capillary 09/24/2013 90  70 - 99 mg/dL Final  Hospital Outpatient Visit on 09/16/2013  Component Date Value Range Status  . MRSA, PCR 09/16/2013 NEGATIVE  NEGATIVE Final  . Staphylococcus aureus 09/16/2013 NEGATIVE  NEGATIVE Final   Comment:  The Xpert SA Assay (FDA                          approved for NASAL specimens                          in patients over 74 years of age),                          is one component of                          a comprehensive surveillance                          program.  Test performance has                          been validated by [employer] for patients greater                          than or equal to 48 year old.                            It is not intended                          to diagnose infection nor to                          guide or monitor treatment.  Marland Kitchen aPTT 09/16/2013 27  24 - 37 seconds Final  . WBC 09/16/2013 8.5  4.0 - 10.5 K/uL Final  . RBC 09/16/2013 4.85  3.87 - 5.11 MIL/uL Final  . Hemoglobin 09/16/2013 13.6  12.0 - 15.0 g/dL Final  . HCT 16/08/9603 41.1  36.0 - 46.0 % Final  . MCV 09/16/2013 84.7  78.0 - 100.0 fL Final  . MCH 09/16/2013 28.0  26.0 - 34.0 pg Final  . MCHC 09/16/2013 33.1  30.0 - 36.0 g/dL Final  . RDW 54/07/8118 14.0  11.5 - 15.5 % Final  . Platelets 09/16/2013 236  150 - 400 K/uL Final  . Sodium 09/16/2013 138  135 - 145 mEq/L Final  . Potassium 09/16/2013 4.0  3.5 - 5.1 mEq/L Final  . Chloride 09/16/2013 106  96 - 112 mEq/L Final  . CO2 09/16/2013 22  19 - 32 mEq/L Final  . Glucose, Bld 09/16/2013 113* 70 - 99 mg/dL Final  . BUN 14/78/2956 17  6 - 23 mg/dL Final  . Creatinine, Ser 09/16/2013 0.60  0.50 - 1.10 mg/dL Final  . Calcium 21/30/8657 9.9  8.4 - 10.5 mg/dL Final  . Total Protein 09/16/2013 7.4  6.0 - 8.3 g/dL Final  . Albumin 84/69/6295 3.8  3.5 - 5.2 g/dL Final  . AST 28/41/3244 13  0 - 37 U/L Final  . ALT 09/16/2013 13  0 - 35 U/L Final  . Alkaline Phosphatase 09/16/2013 99  39 - 117 U/L Final  . Total Bilirubin 09/16/2013 0.4  0.3 - 1.2 mg/dL  Final  . GFR calc non Af Amer 09/16/2013 >90  >90 mL/min Final  . GFR calc Af Amer 09/16/2013 >90  >90 mL/min Final   Comment: (NOTE)                          The eGFR has been calculated using the CKD EPI equation.                          This calculation has not been validated in all clinical situations.                          eGFR's persistently <90 mL/min signify possible Chronic Kidney                          Disease.  Marland Kitchen Prothrombin Time 09/16/2013 12.0  11.6 - 15.2 seconds Final  . INR 09/16/2013 0.90  0.00 - 1.49 Final  . Color, Urine 09/16/2013 YELLOW  YELLOW Final  . APPearance 09/16/2013  CLEAR  CLEAR Final  . Specific Gravity, Urine 09/16/2013 1.035* 1.005 - 1.030 Final  . pH 09/16/2013 5.0  5.0 - 8.0 Final  . Glucose, UA 09/16/2013 NEGATIVE  NEGATIVE mg/dL Final  . Hgb urine dipstick 09/16/2013 NEGATIVE  NEGATIVE Final  . Bilirubin Urine 09/16/2013 NEGATIVE  NEGATIVE Final  . Ketones, ur 09/16/2013 NEGATIVE  NEGATIVE mg/dL Final  . Protein, ur 16/08/9603 NEGATIVE  NEGATIVE mg/dL Final  . Urobilinogen, UA 09/16/2013 0.2  0.0 - 1.0 mg/dL Final  . Nitrite 54/07/8118 NEGATIVE  NEGATIVE Final  . Leukocytes, UA 09/16/2013 NEGATIVE  NEGATIVE Final   MICROSCOPIC NOT DONE ON URINES WITH NEGATIVE PROTEIN, BLOOD, LEUKOCYTES, NITRITE, OR GLUCOSE <1000 mg/dL.  . ABO/RH(D) 09/16/2013 B POS   Final  . Antibody Screen 09/16/2013 NEG   Final  . Sample Expiration 09/16/2013 09/25/2013   Final     X-Rays:No results found.  EKG: Orders placed during the hospital encounter of 04/21/11  . EKG  . EKG     Hospital Course: NABIHA PLANCK is a 62 y.o. who was admitted to [facility]. They were brought to the operating room on 09/22/2013 and underwent Procedure(s): LEFT TOTAL KNEE ARTHROPLASTY.  Patient tolerated the procedure well and was later transferred to the recovery room and then to the orthopaedic floor for postoperative care.  They were given PO and IV analgesics for pain control following their surgery.  They were given 24 hours of postoperative antibiotics of  Anti-infectives   Start     Dose/Rate Route Frequency Ordered Stop   09/23/13 0200  vancomycin (VANCOCIN) IVPB 1000 mg/200 mL premix     1,000 mg 200 mL/hr over 60 Minutes Intravenous Every 12 hours 09/22/13 1744 09/23/13 0318   09/22/13 1045  vancomycin (VANCOCIN) 1,500 mg in sodium chloride 0.9 % 500 mL IVPB     1,500 mg 250 mL/hr over 120 Minutes Intravenous On call to O.R. 09/22/13 1044 09/22/13 1434     and started on DVT prophylaxis in the form of Xarelto.   PT and OT were ordered for total joint protocol.   Discharge planning consulted to help with postop disposition and equipment needs.  Patient had a tough night on the evening of surgery with pain.  They started to get up OOB with therapy on day one walking about 150 feet. Hemovac drain was  pulled without difficulty.  Continued to work with therapy into day two.  Dressing was changed on day two and the incision was healing well.  Patient was seen in rounds and was ready to go home later that day.   Discharge Medications: Prior to Admission medications   Medication Sig Start Date End Date Taking? Authorizing Provider  docusate sodium (COLACE) 100 MG capsule Take 100 mg by mouth daily as needed for constipation.   Yes Historical Provider, MD  fluticasone (FLONASE) 50 MCG/ACT nasal spray Place 2 sprays into the nose daily as needed (for nasal congestion).    Yes Historical Provider, MD  levothyroxine (SYNTHROID, LEVOTHROID) 25 MCG tablet Take 25 mcg by mouth daily before breakfast.    Yes Historical Provider, MD  Liraglutide (VICTOZA) 18 MG/3ML SOPN Inject into the skin daily.   Yes Historical Provider, MD  Liraglutide (VICTOZA) 18 MG/3ML SOPN Inject 1.8 mg elemental calcium/kg/hr into the skin.   Yes Historical Provider, MD  lubiprostone (AMITIZA) 24 MCG capsule Take 24 mcg by mouth 2 (two) times daily with a meal.    Yes Historical Provider, MD  omeprazole (PRILOSEC) 20 MG capsule Take 20 mg by mouth daily.   Yes Historical Provider, MD  polyethylene glycol (MIRALAX / GLYCOLAX) packet Take 17 g by mouth daily as needed (for constipation).   Yes Historical Provider, MD  solifenacin (VESICARE) 10 MG tablet Take 10 mg by mouth daily.    Yes Historical Provider, MD  methocarbamol (ROBAXIN) 500 MG tablet Take 1 tablet (500 mg total) by mouth every 6 (six) hours as needed for muscle spasms. 09/24/13   Marquiz Sotelo, PA-C  oxyCODONE (OXY IR/ROXICODONE) 5 MG immediate release tablet Take 1-2 tablets (5-10 mg total) by mouth every 3 (three) hours as  needed. 09/24/13   Ardra Kuznicki Julien Girt, PA-C  rivaroxaban (XARELTO) 10 MG TABS tablet Take 1 tablet (10 mg total) by mouth daily with breakfast. Take Xarelto for two and a half more weeks, then discontinue Xarelto. Once the patient has completed the blood thinner regimen, then take a Baby 81 mg Aspirin daily for four more weeks. 09/24/13   Azalea Cedar, PA-C  traMADol (ULTRAM) 50 MG tablet Take 1 tablet (50 mg total) by mouth every 8 (eight) hours as needed (mild pain). 09/24/13   Raffi Milstein Julien Girt, PA-C   Discharge home with home health  Diet - Diabetic diet  Follow up - in 2 weeks  Activity - WBAT  Disposition - Home  Condition Upon Discharge - Good  D/C Meds - See DC Summary  DVT Prophylaxis - Xarelto       Discharge Orders   Future Orders Complete By Expires   Call MD / Call 911  As directed    Comments:     If you experience chest pain or shortness of breath, CALL 911 and be transported to the hospital emergency room.  If you develope a fever above 101 F, pus (white drainage) or increased drainage or redness at the wound, or calf pain, call your surgeon's office.   Change dressing  As directed    Comments:     Change dressing daily with sterile 4 x 4 inch gauze dressing and apply TED hose. Do not submerge the incision under water.   Constipation Prevention  As directed    Comments:     Drink plenty of fluids.  Prune juice may be helpful.  You may use a stool softener, such as Colace (over the counter) 100 mg twice a day.  Use MiraLax (over the counter) for constipation as needed.   CPM  As directed    Comments:     Continuous passive motion machine (CPM):      Use the CPM for 2 hour sessions for 4-6 hours per day.      You may increase by 5 degress per day.  You may break it up into 2 or 3 sessions per day.      Use CPM for 2-3 weeks or until you are told to stop.   Diet - low sodium heart healthy  As directed    Discharge instructions  As directed    Comments:      Pick up stool softner and laxative for home. Do not submerge incision under water. May shower. Continue to use ice for pain and swelling from surgery. Take Xarelto for two and a half more weeks, then discontinue Xarelto. Once the patient has completed the blood thinner regimen, then take a Baby 81 mg Aspirin daily for four more weeks.   Do not put a pillow under the knee. Place it under the heel.  As directed    Do not sit on low chairs, stoools or toilet seats, as it may be difficult to get up from low surfaces  As directed    Driving restrictions  As directed    Comments:     No driving until released by the physician.   Increase activity slowly as tolerated  As directed    Lifting restrictions  As directed    Comments:     No lifting until released by the physician.   Patient may shower  As directed    Comments:     You may shower without a dressing once there is no drainage.  Do not wash over the wound.  If drainage remains, do not shower until drainage stops.   TED hose  As directed    Comments:     Use stockings (TED hose) for 3 weeks on both leg(s).  You may remove them at night for sleeping.   Weight bearing as tolerated  As directed    Questions:     Laterality:     Extremity:         Medication List    STOP taking these medications       calcium carbonate 1250 MG tablet  Commonly known as:  OS-CAL - dosed in mg of elemental calcium     HYDROcodone-acetaminophen 5-325 MG per tablet  Commonly known as:  NORCO/VICODIN  Replaced by:  HYDROcodone-acetaminophen 7.5-325 MG per tablet     lidocaine 5 %  Commonly known as:  LIDODERM     meloxicam 15 MG tablet  Commonly known as:  MOBIC     multivitamin with minerals Tabs tablet     oxyCODONE 5 MG immediate release tablet  Commonly known as:  Oxy IR/ROXICODONE      TAKE these medications       AMITIZA 24 MCG capsule  Generic drug:  lubiprostone  Take 24 mcg by mouth 2 (two) times daily with a meal.      docusate sodium 100 MG capsule  Commonly known as:  COLACE  Take 100 mg by mouth daily as needed for constipation.     fluticasone 50 MCG/ACT nasal spray  Commonly known as:  FLONASE  Place 2 sprays into the nose daily as needed (for nasal congestion).     HYDROcodone-acetaminophen 7.5-325 MG per tablet  Commonly known as:  NORCO  Take 1-2 tablets by mouth every 4 (four) hours as needed for moderate pain.     levothyroxine 25 MCG tablet  Commonly known as:  SYNTHROID, LEVOTHROID  Take 25 mcg by mouth daily before breakfast.     methocarbamol 500 MG tablet  Commonly known as:  ROBAXIN  Take 1 tablet (500 mg total) by mouth every 6 (six) hours as needed for muscle spasms.     omeprazole 20 MG capsule  Commonly known as:  PRILOSEC  Take 20 mg by mouth daily.     polyethylene glycol packet  Commonly known as:  MIRALAX / GLYCOLAX  Take 17 g by mouth daily as needed (for constipation).     rivaroxaban 10 MG Tabs tablet  Commonly known as:  XARELTO  - Take 1 tablet (10 mg total) by mouth daily with breakfast. Take Xarelto for two and a half more weeks, then discontinue Xarelto.  - Once the patient has completed the blood thinner regimen, then take a Baby 81 mg Aspirin daily for four more weeks.     solifenacin 10 MG tablet  Commonly known as:  VESICARE  Take 10 mg by mouth daily.     traMADol 50 MG tablet  Commonly known as:  ULTRAM  Take 1 tablet (50 mg total) by mouth every 8 (eight) hours as needed (mild pain).     VICTOZA 18 MG/3ML Sopn  Generic drug:  Liraglutide  Inject into the skin daily.     VICTOZA 18 MG/3ML Sopn  Generic drug:  Liraglutide  Inject 1.8 mg elemental calcium/kg/hr into the skin.       Follow-up Information   Follow up with Loanne Drilling, MD. Schedule an appointment as soon as possible for a visit on 10/07/2013. (Call [phone] tomorrow to make the appointment)    Specialty:  Orthopedic Surgery   Contact information:   [address] [place] [address] [phone]       Signed: Patrica Duel 10/02/2013, 10:20 AM

## 2013-09-24 NOTE — Progress Notes (Signed)
   Subjective: 2 Days Post-Op Procedure(s) (LRB): LEFT TOTAL KNEE ARTHROPLASTY (Left) Patient reports pain as mild.   Patient seen in rounds with Dr. Lequita Halt. Patient is well, and has had no acute complaints or problems Patient is ready to go home  Objective: Vital signs in last 24 hours: Temp:  [97.5 F (36.4 C)-98.5 F (36.9 C)] 98.5 F (36.9 C) (11/05 0550) Pulse Rate:  [75-81] 75 (11/05 0550) Resp:  [16] 16 (11/04 1444) BP: (107-124)/(68-84) 124/84 mmHg (11/05 0550) SpO2:  [94 %-96 %] 95 % (11/05 0550)  Intake/Output from previous day:  Intake/Output Summary (Last 24 hours) at 09/24/13 0739 Last data filed at 09/24/13 0602  Gross per 24 hour  Intake    720 ml  Output   1800 ml  Net  -1080 ml    Intake/Output this shift:    Labs:  Recent Labs  09/23/13 0446 09/24/13 0432  HGB 10.9* 10.1*    Recent Labs  09/23/13 0446 09/24/13 0432  WBC 10.6* 11.4*  RBC 3.94 3.63*  HCT 33.6* 31.1*  PLT 191 185    Recent Labs  09/23/13 0446 09/24/13 0432  NA 138 138  K 4.3 4.1  CL 105 104  CO2 25 26  BUN 11 12  CREATININE 0.53 0.65  GLUCOSE 174* 140*  CALCIUM 8.8 9.0   No results found for this basename: LABPT, INR,  in the last 72 hours  EXAM: General - Patient is Alert and Appropriate Extremity - Neurovascular intact Sensation intact distally Dorsiflexion/Plantar flexion intact Incision - clean, dry, no drainage, healing Motor Function - intact, moving foot and toes well on exam.   Assessment/Plan: 2 Days Post-Op Procedure(s) (LRB): LEFT TOTAL KNEE ARTHROPLASTY (Left) Procedure(s) (LRB): LEFT TOTAL KNEE ARTHROPLASTY (Left) Past Medical History  Diagnosis Date  . Arthritis   . Hx of bladder infections   . History of measles, mumps, or rubella   . History of chicken pox   . Trichomonas   . Yeast in stool   . Menopausal symptoms 2003  . Breast mass, left 2003  . Urge incontinence 2005  . Obesity, morbid 2006  . Hypothyroidism   . Chronic back  pain     seeing Pain specialist  . Hemorrhoids   . Polyp of colon     removed during colonoscopy  . Vertigo     over a year was last episode  . Irritable bowel syndrome   . H/O scarlet fever   . GERD (gastroesophageal reflux disease)   . Diabetes mellitus     borderline  . DDD (degenerative disc disease)    Active Problems:   * No active hospital problems. *  Estimated body mass index is 42.73 kg/(m^2) as calculated from the following:   Height as of this encounter: 5' 8.11" (1.73 m).   Weight as of this encounter: 127.9 kg (281 lb 15.5 oz). Up with therapy Discharge home with home health Diet - Diabetic diet Follow up - in 2 weeks Activity - WBAT Disposition - Home Condition Upon Discharge - Good D/C Meds - See DC Summary DVT Prophylaxis - Xarelto  Nadie Fiumara 09/24/2013, 7:39 AM

## 2013-09-24 NOTE — Plan of Care (Signed)
Problem: Consults Goal: Diagnosis- Total Joint Replacement Outcome: Completed/Met Date Met:  09/24/13 Primary Total Knee Left

## 2013-09-24 NOTE — Progress Notes (Signed)
Physical Therapy Treatment Patient Details Name: Kelly Taylor MRN: [id number] DOB: 10-Aug-1951 Today's Date: 09/24/2013 Time: 0916-0950 PT Time Calculation (min): 34 min  PT Assessment / Plan / Recommendation  History of Present Illness 62 yo female s/p L TKA 11/3.    PT Comments   Progressing with mobility. Plan is for d/c home later today. Completed all education in one session. Practiced exercises, gait, and stair negotiation. No further questions from pt. Recommend HHPT. All education completed.   Follow Up Recommendations  Home health PT     Does the patient have the potential to tolerate intense rehabilitation     Barriers to Discharge        Equipment Recommendations  None recommended by PT    Recommendations for Other Services    Frequency 7X/week   Progress towards PT Goals Progress towards PT goals: Progressing toward goals  Plan Current plan remains appropriate    Precautions / Restrictions Precautions Precautions: Knee;Fall Restrictions Weight Bearing Restrictions: No LLE Weight Bearing: Weight bearing as tolerated   Pertinent Vitals/Pain 5/10 R knee with activity. Ice applied end of session    Mobility  Bed Mobility Bed Mobility: Not assessed Transfers Transfers: Sit to Stand;Stand to Sit Sit to Stand: 4: Min assist;From chair/3-in-1;With armrests Stand to Sit: 4: Min guard;To chair/3-in-1 Details for Transfer Assistance: x 2. Assist this session to rise from low chair.  VCs safety, technique, hand placement. Assist to rise, stabilize. Therapist educated pt on proper/safe technique for using walker to stand/sit however pt prefers to perform "her way" with 1 foot/leg outside of walker and pulling up on walker with hand on opposite handle Ambulation/Gait Ambulation/Gait Assistance: 4: Min guard Ambulation Distance (Feet): 200 Feet Assistive device: Rolling walker Ambulation/Gait Assistance Details: close guard for safety Gait Pattern: Step-to  pattern;Decreased stride length;Antalgic;Trunk flexed    Exercises Total Joint Exercises Ankle Circles/Pumps: AROM;Both;15 reps;Seated Quad Sets: AROM;Left;10 reps;Seated Heel Slides: AAROM;Left;10 reps;Seated Hip ABduction/ADduction: AAROM;Left;10 reps;Seated Straight Leg Raises: AAROM;Left;10 reps;Seated Knee Flexion: AAROM;Left;Seated (with pillowcase under foot. )   PT Diagnosis:    PT Problem List:   PT Treatment Interventions:     PT Goals (current goals can now be found in the care plan section)    Visit Information  Last PT Received On: 09/24/13 Assistance Needed: +1 History of Present Illness: 62 yo female s/p L TKA 11/3.     Subjective Data      Cognition  Cognition Arousal/Alertness: Awake/alert Behavior During Therapy: WFL for tasks assessed/performed Overall Cognitive Status: Within Functional Limits for tasks assessed    Balance     End of Session PT - End of Session Equipment Utilized During Treatment: Gait belt Activity Tolerance: Patient tolerated treatment well Patient left: in chair;with call bell/phone within reach CPM Left Knee CPM Left Knee: Off   GP     Rebeca Alert, MPT Pager: [phone]

## 2013-09-25 NOTE — Care Management Note (Signed)
    Page 1 of 1   09/25/2013     5:13:52 PM   CARE MANAGEMENT NOTE 09/25/2013  Patient:  Kelly Taylor, Kelly Taylor   Account Number:  [id number]  Date Initiated:  09/24/2013  Documentation initiated by:  Colleen Can  Subjective/Objective Assessment:   dx Total left knee replacemnt    [facility] to start [facility] serrvices 09/25/2013     Action/Plan:   CM  spoke with patient. Plans are for patient to return to her home where spouse will be caregiver. She already has DME. Genevieve Norlander will provide [facility] services.   Anticipated DC Date:  09/24/2013   Anticipated DC Plan:  HOME W HOME HEALTH SERVICES      DC Planning Services  CM consult      [facility] Choice  HOME HEALTH   Choice offered to / List presented to:  C-1 Patient        HH arranged  HH-2 PT      [facility] agency  [facility]   Status of service:  Completed, signed off Medicare Important Message given?   (If response is "NO", the following Medicare IM given date fields will be blank) Date Medicare IM given:   Date Additional Medicare IM given:    Discharge Disposition:  HOME W HOME HEALTH SERVICES  Per UR Regulation:    If discussed at Long Length of Stay Meetings, dates discussed:    Comments:

## 2013-10-01 ENCOUNTER — Ambulatory Visit (HOSPITAL_COMMUNITY)
Admission: RE | Admit: 2013-10-01 | Discharge: 2013-10-01 | Disposition: A | Discharge status: Home / Self Care | Payer: BC Managed Care – PPO | Source: Ambulatory Visit | Attending: Cardiology | Admitting: Cardiology

## 2013-10-01 ENCOUNTER — Other Ambulatory Visit (HOSPITAL_COMMUNITY): Payer: Self-pay | Admitting: Orthopedic Surgery

## 2013-10-01 DIAGNOSIS — I82402 Acute embolism and thrombosis of unspecified deep veins of left lower extremity: Secondary | ICD-10-CM

## 2013-10-01 DIAGNOSIS — M7989 Other specified soft tissue disorders: Secondary | ICD-10-CM

## 2013-10-01 DIAGNOSIS — I82409 Acute embolism and thrombosis of unspecified deep veins of unspecified lower extremity: Secondary | ICD-10-CM | POA: Insufficient documentation

## 2013-10-01 DIAGNOSIS — M79609 Pain in unspecified limb: Secondary | ICD-10-CM

## 2013-10-01 NOTE — Progress Notes (Signed)
Left Lower Ext. Venous Duplex Completed. Negative for DVT. Lichelle Viets, BS, RDMS, RVT  

## 2014-02-11 ENCOUNTER — Encounter (HOSPITAL_COMMUNITY): Payer: Self-pay | Admitting: Emergency Medicine

## 2014-02-11 ENCOUNTER — Emergency Department (HOSPITAL_COMMUNITY)
Admission: EM | Admit: 2014-02-11 | Discharge: 2014-02-11 | Disposition: A | Discharge status: Home / Self Care | Payer: BC Managed Care – PPO | Attending: Emergency Medicine | Admitting: Emergency Medicine

## 2014-02-11 ENCOUNTER — Emergency Department (HOSPITAL_COMMUNITY): Payer: BC Managed Care – PPO

## 2014-02-11 DIAGNOSIS — M129 Arthropathy, unspecified: Secondary | ICD-10-CM | POA: Insufficient documentation

## 2014-02-11 DIAGNOSIS — Z860100 Personal history of colon polyps, unspecified: Secondary | ICD-10-CM | POA: Insufficient documentation

## 2014-02-11 DIAGNOSIS — S0993XA Unspecified injury of face, initial encounter: Secondary | ICD-10-CM | POA: Insufficient documentation

## 2014-02-11 DIAGNOSIS — Z8742 Personal history of other diseases of the female genital tract: Secondary | ICD-10-CM | POA: Insufficient documentation

## 2014-02-11 DIAGNOSIS — K589 Irritable bowel syndrome without diarrhea: Secondary | ICD-10-CM | POA: Insufficient documentation

## 2014-02-11 DIAGNOSIS — Z87440 Personal history of urinary (tract) infections: Secondary | ICD-10-CM | POA: Insufficient documentation

## 2014-02-11 DIAGNOSIS — Z8619 Personal history of other infectious and parasitic diseases: Secondary | ICD-10-CM | POA: Insufficient documentation

## 2014-02-11 DIAGNOSIS — E069 Thyroiditis, unspecified: Secondary | ICD-10-CM | POA: Insufficient documentation

## 2014-02-11 DIAGNOSIS — Z79899 Other long term (current) drug therapy: Secondary | ICD-10-CM | POA: Insufficient documentation

## 2014-02-11 DIAGNOSIS — S199XXA Unspecified injury of neck, initial encounter: Principal | ICD-10-CM

## 2014-02-11 DIAGNOSIS — Z880 Allergy status to penicillin: Secondary | ICD-10-CM | POA: Insufficient documentation

## 2014-02-11 DIAGNOSIS — Y9389 Activity, other specified: Secondary | ICD-10-CM | POA: Insufficient documentation

## 2014-02-11 DIAGNOSIS — IMO0002 Reserved for concepts with insufficient information to code with codable children: Secondary | ICD-10-CM | POA: Insufficient documentation

## 2014-02-11 DIAGNOSIS — G8929 Other chronic pain: Secondary | ICD-10-CM | POA: Insufficient documentation

## 2014-02-11 DIAGNOSIS — K219 Gastro-esophageal reflux disease without esophagitis: Secondary | ICD-10-CM | POA: Insufficient documentation

## 2014-02-11 DIAGNOSIS — Y92410 Unspecified street and highway as the place of occurrence of the external cause: Secondary | ICD-10-CM | POA: Insufficient documentation

## 2014-02-11 DIAGNOSIS — R52 Pain, unspecified: Secondary | ICD-10-CM

## 2014-02-11 MED ORDER — ONDANSETRON HCL 4 MG PO TABS: 4.0000 mg | ORAL_TABLET | Freq: Four times a day (QID) | ORAL | Status: DC

## 2014-02-11 MED ORDER — MORPHINE SULFATE 4 MG/ML IJ SOLN
4.0000 mg | Freq: Once | INTRAMUSCULAR | Status: AC
  Administered 2014-02-11: 4 mg via INTRAMUSCULAR
  Filled 2014-02-11: qty 1

## 2014-02-11 MED ORDER — HYDROCODONE-ACETAMINOPHEN 5-325 MG PO TABS: 2.0000 | ORAL_TABLET | Freq: Four times a day (QID) | ORAL | Status: DC | PRN

## 2014-02-11 MED ORDER — HYDROCODONE-ACETAMINOPHEN 5-325 MG PO TABS
2.0000 | ORAL_TABLET | Freq: Once | ORAL | Status: AC
  Administered 2014-02-11: 2 via ORAL
  Filled 2014-02-11: qty 2

## 2014-02-11 MED ORDER — ONDANSETRON 4 MG PO TBDP
4.0000 mg | ORAL_TABLET | Freq: Once | ORAL | Status: AC
  Administered 2014-02-11: 4 mg via ORAL
  Filled 2014-02-11: qty 1
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
## 2014-02-11 NOTE — ED Notes (Signed)
Family at bedside. 
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
## 2014-02-11 NOTE — ED Notes (Signed)
MD at bedside. 
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
## 2014-02-11 NOTE — ED Notes (Signed)
Patient transported to CT 
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
## 2014-02-11 NOTE — ED Notes (Signed)
Medications not given yet due to patient being in CT/Radiology.

## 2014-02-12 NOTE — ED Provider Notes (Signed)
Medical screening examination/treatment/procedure(s) were performed by non-physician practitioner and as supervising physician I was immediately available for consultation/collaboration.   EKG Interpretation None        Junius ArgyleForrest S Anaiz Qazi, MD 02/12/14 [phone]
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
## 2014-03-10 ENCOUNTER — Other Ambulatory Visit: Payer: Self-pay | Admitting: Obstetrics and Gynecology

## 2014-03-10 DIAGNOSIS — N644 Mastodynia: Secondary | ICD-10-CM

## 2014-03-10 DIAGNOSIS — N6310 Unspecified lump in the right breast, unspecified quadrant: Secondary | ICD-10-CM

## 2014-03-19 ENCOUNTER — Other Ambulatory Visit: Payer: BC Managed Care – PPO

## 2014-03-20 HISTORY — PX: COLONOSCOPY: SHX174

## 2014-03-27 ENCOUNTER — Ambulatory Visit
Admission: RE | Admit: 2014-03-27 | Discharge: 2014-03-27 | Disposition: A | Discharge status: Home / Self Care | Payer: BC Managed Care – PPO | Source: Ambulatory Visit | Attending: Obstetrics and Gynecology | Admitting: Obstetrics and Gynecology

## 2014-03-27 DIAGNOSIS — N644 Mastodynia: Secondary | ICD-10-CM

## 2014-03-27 DIAGNOSIS — N6310 Unspecified lump in the right breast, unspecified quadrant: Secondary | ICD-10-CM

## 2014-04-25 ENCOUNTER — Emergency Department (HOSPITAL_COMMUNITY)
Admission: EM | Admit: 2014-04-25 | Discharge: 2014-04-25 | Disposition: A | Discharge status: Home / Self Care | Payer: BC Managed Care – PPO | Attending: Emergency Medicine | Admitting: Emergency Medicine

## 2014-04-25 ENCOUNTER — Emergency Department (HOSPITAL_COMMUNITY): Payer: BC Managed Care – PPO

## 2014-04-25 ENCOUNTER — Encounter (HOSPITAL_COMMUNITY): Payer: Self-pay | Admitting: Emergency Medicine

## 2014-04-25 DIAGNOSIS — M1990 Unspecified osteoarthritis, unspecified site: Secondary | ICD-10-CM | POA: Insufficient documentation

## 2014-04-25 DIAGNOSIS — Z79899 Other long term (current) drug therapy: Secondary | ICD-10-CM | POA: Insufficient documentation

## 2014-04-25 DIAGNOSIS — K589 Irritable bowel syndrome without diarrhea: Secondary | ICD-10-CM | POA: Insufficient documentation

## 2014-04-25 DIAGNOSIS — K219 Gastro-esophageal reflux disease without esophagitis: Secondary | ICD-10-CM | POA: Insufficient documentation

## 2014-04-25 DIAGNOSIS — Z860100 Personal history of colon polyps, unspecified: Secondary | ICD-10-CM | POA: Insufficient documentation

## 2014-04-25 DIAGNOSIS — M549 Dorsalgia, unspecified: Secondary | ICD-10-CM | POA: Insufficient documentation

## 2014-04-25 DIAGNOSIS — Z8619 Personal history of other infectious and parasitic diseases: Secondary | ICD-10-CM | POA: Insufficient documentation

## 2014-04-25 DIAGNOSIS — R071 Chest pain on breathing: Secondary | ICD-10-CM | POA: Insufficient documentation

## 2014-04-25 DIAGNOSIS — G8929 Other chronic pain: Secondary | ICD-10-CM | POA: Insufficient documentation

## 2014-04-25 DIAGNOSIS — R0781 Pleurodynia: Secondary | ICD-10-CM

## 2014-04-25 DIAGNOSIS — Z880 Allergy status to penicillin: Secondary | ICD-10-CM | POA: Insufficient documentation

## 2014-04-25 DIAGNOSIS — E039 Hypothyroidism, unspecified: Secondary | ICD-10-CM | POA: Insufficient documentation

## 2014-04-25 DIAGNOSIS — Z791 Long term (current) use of non-steroidal anti-inflammatories (NSAID): Secondary | ICD-10-CM | POA: Insufficient documentation

## 2014-04-25 LAB — I-STAT CHEM 8, ED
BUN: 14 mg/dL (ref 6–23)
CALCIUM ION: 1.17 mmol/L (ref 1.13–1.30)
CREATININE: 0.7 mg/dL (ref 0.50–1.10)
Chloride: 106 mEq/L (ref 96–112)
GLUCOSE: 141 mg/dL — AB (ref 70–99)
HCT: 39 % (ref 36.0–46.0)
HEMOGLOBIN: 13.3 g/dL (ref 12.0–15.0)
Potassium: 3.8 mEq/L (ref 3.7–5.3)
Sodium: 142 mEq/L (ref 137–147)
TCO2: 27 mmol/L (ref 0–100)

## 2014-04-25 LAB — CBC WITH DIFFERENTIAL/PLATELET
Basophils Absolute: 0 10*3/uL (ref 0.0–0.1)
Basophils Relative: 0 % (ref 0–1)
EOS ABS: 0.1 10*3/uL (ref 0.0–0.7)
Eosinophils Relative: 2 % (ref 0–5)
HEMATOCRIT: 38.7 % (ref 36.0–46.0)
Hemoglobin: 12.9 g/dL (ref 12.0–15.0)
LYMPHS ABS: 2.4 10*3/uL (ref 0.7–4.0)
LYMPHS PCT: 30 % (ref 12–46)
MCH: 28.2 pg (ref 26.0–34.0)
MCHC: 33.3 g/dL (ref 30.0–36.0)
MCV: 84.7 fL (ref 78.0–100.0)
MONO ABS: 0.4 10*3/uL (ref 0.1–1.0)
MONOS PCT: 5 % (ref 3–12)
Neutro Abs: 5 10*3/uL (ref 1.7–7.7)
Neutrophils Relative %: 63 % (ref 43–77)
Platelets: 224 10*3/uL (ref 150–400)
RBC: 4.57 MIL/uL (ref 3.87–5.11)
RDW: 14.5 % (ref 11.5–15.5)
WBC: 8 10*3/uL (ref 4.0–10.5)

## 2014-04-25 LAB — D-DIMER, QUANTITATIVE: D-Dimer, Quant: 1.11 ug/mL-FEU — ABNORMAL HIGH (ref 0.00–0.48)

## 2014-04-25 LAB — TROPONIN I: Troponin I: <0.3 ng/mL (ref <0.30)

## 2014-04-25 LAB — I-STAT TROPONIN, ED: Troponin i, poc: 0 ng/mL (ref 0.00–0.08)

## 2014-04-25 MED ORDER — MELOXICAM 15 MG PO TABS: 15.0000 mg | ORAL_TABLET | Freq: Every day | ORAL | Status: DC

## 2014-04-25 MED ORDER — MORPHINE SULFATE 4 MG/ML IJ SOLN
6.0000 mg | Freq: Once | INTRAMUSCULAR | Status: AC
  Administered 2014-04-25: 6 mg via INTRAVENOUS
  Filled 2014-04-25: qty 2

## 2014-04-25 MED ORDER — HYDROCODONE-ACETAMINOPHEN 5-325 MG PO TABS: 1.0000 | ORAL_TABLET | ORAL | Status: DC | PRN

## 2014-04-25 MED ORDER — IOHEXOL 350 MG/ML SOLN
100.0000 mL | Freq: Once | INTRAVENOUS | Status: AC | PRN
  Administered 2014-04-25: 100 mL via INTRAVENOUS

## 2014-04-25 MED ORDER — METOCLOPRAMIDE HCL 5 MG/ML IJ SOLN
10.0000 mg | Freq: Once | INTRAMUSCULAR | Status: AC
  Administered 2014-04-25: 10 mg via INTRAVENOUS
  Filled 2014-04-25: qty 2

## 2014-04-25 MED ORDER — KETOROLAC TROMETHAMINE 30 MG/ML IJ SOLN
30.0000 mg | Freq: Once | INTRAMUSCULAR | Status: AC
  Administered 2014-04-25: 30 mg via INTRAVENOUS
  Filled 2014-04-25: qty 1

## 2014-04-25 MED ORDER — ONDANSETRON HCL 4 MG/2ML IJ SOLN
4.0000 mg | Freq: Once | INTRAMUSCULAR | Status: AC
  Administered 2014-04-25: 4 mg via INTRAVENOUS
  Filled 2014-04-25: qty 2

## 2014-04-25 NOTE — ED Notes (Signed)
Pt O2 93-95% while ambulating

## 2014-04-25 NOTE — Discharge Instructions (Signed)
Please follow up closely with your doctor for further evaluation of your chest discomfort.  Take mobic as needed for pain.  Return if you have any other concerns.   Chest Wall Pain Chest wall pain is pain in or around the bones and muscles of your chest. It may take up to 6 weeks to get better. It may take longer if you must stay physically active in your work and activities.  CAUSES  Chest wall pain may happen on its own. However, it may be caused by:  A viral illness like the flu.  Injury.  Coughing.  Exercise.  Arthritis.  Fibromyalgia.  Shingles. HOME CARE INSTRUCTIONS   Avoid overtiring physical activity. Try not to strain or perform activities that cause pain. This includes any activities using your chest or your abdominal and side muscles, especially if heavy weights are used.  Put ice on the sore area.  Put ice in a plastic bag.  Place a towel between your skin and the bag.  Leave the ice on for 15-20 minutes per hour while awake for the first 2 days.  Only take over-the-counter or prescription medicines for pain, discomfort, or fever as directed by your caregiver. SEEK IMMEDIATE MEDICAL CARE IF:   Your pain increases, or you are very uncomfortable.  You have a fever.  Your chest pain becomes worse.  You have new, unexplained symptoms.  You have nausea or vomiting.  You feel sweaty or lightheaded.  You have a cough with phlegm (sputum), or you cough up blood. MAKE SURE YOU:   Understand these instructions.  Will watch your condition.  Will get help right away if you are not doing well or get worse. Document Released: 11/06/2005 Document Revised: 01/29/2012 Document Reviewed: 07/03/2011 [facility] Patient Information 2014 [place], [place].

## 2014-04-25 NOTE — ED Notes (Signed)
Pt to CT

## 2014-04-25 NOTE — ED Notes (Addendum)
Pt c/o inc pain and nausea at this time.  EDPA made aware.  EDPA also aware of soft BP.

## 2014-04-25 NOTE — ED Notes (Signed)
PER EMS - pt was walking around a store with sudden onset pain under R breast, pale/diaphoretic initially. No pain with palp. No radiation of pain.  Pt denies n/v.  10/10 pain.  Skin PWD on arrival to ED.  A+Ox4.  NAD.

## 2014-04-25 NOTE — ED Provider Notes (Signed)
Patient handed off to myself by Fayrene HelperBowie Tran, PA-C  I spoke with patient just after hand off to make sure everyone was aware of treatment and plan.  While at [employer] she developed acute symptoms of pain under her right breast. She became diaphoretic and anxious. They called EMS and brought her to the ED.  Results for orders placed during the hospital encounter of 04/25/14  CBC WITH DIFFERENTIAL      Result Value Ref Range   WBC 8.0  4.0 - 10.5 K/uL   RBC 4.57  3.87 - 5.11 MIL/uL   Hemoglobin 12.9  12.0 - 15.0 g/dL   HCT 16.138.7  09.636.0 - 04.546.0 %   MCV 84.7  78.0 - 100.0 fL   MCH 28.2  26.0 - 34.0 pg   MCHC 33.3  30.0 - 36.0 g/dL   RDW 40.914.5  81.111.5 - 91.415.5 %   Platelets 224  150 - 400 K/uL   Neutrophils Relative % 63  43 - 77 %   Neutro Abs 5.0  1.7 - 7.7 K/uL   Lymphocytes Relative 30  12 - 46 %   Lymphs Abs 2.4  0.7 - 4.0 K/uL   Monocytes Relative 5  3 - 12 %   Monocytes Absolute 0.4  0.1 - 1.0 K/uL   Eosinophils Relative 2  0 - 5 %   Eosinophils Absolute 0.1  0.0 - 0.7 K/uL   Basophils Relative 0  0 - 1 %   Basophils Absolute 0.0  0.0 - 0.1 K/uL  D-DIMER, QUANTITATIVE      Result Value Ref Range   D-Dimer, Quant 1.11 (*) 0.00 - 0.48 ug/mL-FEU  I-STAT CHEM 8, ED      Result Value Ref Range   Sodium 142  137 - 147 mEq/L   Potassium 3.8  3.7 - 5.3 mEq/L   Chloride 106  96 - 112 mEq/L   BUN 14  6 - 23 mg/dL   Creatinine, Ser 7.820.70  0.50 - 1.10 mg/dL   Glucose, Bld 956141 (*) 70 - 99 mg/dL   Calcium, Ion 2.131.17  0.861.13 - 1.30 mmol/L   TCO2 27  0 - 100 mmol/L   Hemoglobin 13.3  12.0 - 15.0 g/dL   HCT 57.839.0  46.936.0 - 62.946.0 %  I-STAT TROPOININ, ED      Result Value Ref Range   Troponin i, poc 0.00  0.00 - 0.08 ng/mL   Comment 3            Dg Chest 2 View  04/25/2014   CLINICAL DATA:  Right-sided chest pain, shortness of breath  EXAM: CHEST  2 VIEW  COMPARISON:  Prior chest x-ray 04/01/2013  FINDINGS: Stable cardiac and mediastinal contours. No focal airspace consolidation, pleural effusion or  pneumothorax. No evidence of edema. No acute osseous abnormality. Degenerative spurring throughout the thoracic spine. Mild central bronchitic changes remain unchanged.  IMPRESSION: Stable chest x-ray without evidence of acute cardiopulmonary process.   Electronically Signed   By: Malachy MoanHeath  McCullough M.D.   On: 04/25/2014 13:45   Ct Angio Chest Pe W/cm &/or Wo Cm  04/25/2014   CLINICAL DATA:  There is no edema or consolidation. On axial slice 74 series 7, there is a stable 4 mm nodular opacity in the posterior segment of the right lower lobe.  EXAM: CT ANGIOGRAPHY CHEST WITH CONTRAST  TECHNIQUE: Multidetector CT imaging of the chest was performed using the standard protocol during bolus administration of intravenous contrast. Multiplanar CT image reconstructions  and MIPs were obtained to evaluate the vascular anatomy.  CONTRAST:  OMNIPAQUE IOHEXOL 350 MG/ML SOLN  COMPARISON:  Chest CT September 02, 2005 and chest radiograph April 25, 2014  FINDINGS: There is no demonstrable pulmonary embolus. There is no appreciable thoracic aortic aneurysm or dissection.  There is patchy bibasilar atelectatic change. On axial slice 74 series 7, there is a stable 4 mm nodular opacity in the posterior segment left lower lobe. There is no edema or consolidation.  There is no appreciable thoracic adenopathy. The pericardium is not thickened.  In the visualized upper abdomen, no abnormality is appreciable. There is degenerative change in the thoracic spine and in the right shoulder region12. There are no blastic or lytic bone lesions. There is slight anterolisthesis of T2 on T3, probably due to underlying spondylosis.  Review of the MIP images confirms the above findings.  IMPRESSION: No demonstrable pulmonary embolus. Areas of atelectatic change bilaterally. No edema or consolidation. Stable 4 mm nodular opacity left base. Stability over this time interval is consistent with benign etiology.  Slight anterolisthesis of T2 on T3 is  probably due to spondylosis. Note that there is degenerative type change in the thoracic spine with diffuse idiopathic skeletal hyperostosis in the lower thoracic/upper lumbar region. There is arthropathy in the right shoulder is well.   Electronically Signed   By: Bretta Bang M.D.   On: 04/25/2014 14:29   Mm Digital Diagnostic Bilat  03/27/2014   CLINICAL DATA:  Palpable abnormality in the lower outer quadrant of the right breast. Diffuse tenderness in the right breast radiating to the back. The patient states she has no palpable abnormality or pain in the left breast.  EXAM: DIGITAL DIAGNOSTIC  BILATERAL MAMMOGRAM WITH CAD  ULTRASOUND RIGHT BREAST  COMPARISON:  With priors.  ACR Breast Density Category b: There are scattered areas of fibroglandular density.  FINDINGS: No suspicious mass, malignant type microcalcifications or distortion detected.  Mammographic images were processed with CAD.  On physical exam, I do not palpate a discrete mass the right breast.  Ultrasound is performed, showing normal tissue in the area of clinical concern in the lower outer quadrant of the right breast. No solid or cystic mass, abnormal shadowing or distortion detected.  IMPRESSION: No evidence of malignancy in either breast.  RECOMMENDATION: Bilateral screening mammogram in 1 year is recommended.  I have discussed the findings and recommendations with the patient. Results were also provided in writing at the conclusion of the visit. If applicable, a reminder letter will be sent to the patient regarding the next appointment.  BI-RADS CATEGORY  1: Negative.   Electronically Signed   By: Baird Lyons M.D.   On: 03/27/2014 14:41   US Breast Ltd Uni Right Inc Axilla  03/27/2014   CLINICAL DATA:  Palpable abnormality in the lower outer quadrant of the right breast. Diffuse tenderness in the right breast radiating to the back. The patient states she has no palpable abnormality or pain in the left breast.  EXAM: DIGITAL  DIAGNOSTIC  BILATERAL MAMMOGRAM WITH CAD  ULTRASOUND RIGHT BREAST  COMPARISON:  With priors.  ACR Breast Density Category b: There are scattered areas of fibroglandular density.  FINDINGS: No suspicious mass, malignant type microcalcifications or distortion detected.  Mammographic images were processed with CAD.  On physical exam, I do not palpate a discrete mass the right breast.  Ultrasound is performed, showing normal tissue in the area of clinical concern in the lower outer quadrant of the right  breast. No solid or cystic mass, abnormal shadowing or distortion detected.  IMPRESSION: No evidence of malignancy in either breast.  RECOMMENDATION: Bilateral screening mammogram in 1 year is recommended.  I have discussed the findings and recommendations with the patient. Results were also provided in writing at the conclusion of the visit. If applicable, a reminder letter will be sent to the patient regarding the next appointment.  BI-RADS CATEGORY  1: Negative.   Electronically Signed   By: Baird Lyons M.D.   On: 03/27/2014 14:41   Thus far she has had an Mammogram on right breast 03/27/2014 which was normal. She has a hx of breast reduction.  She had a normal chest xray and CT angio to r/o PE. The patients pain over all has been controlled but it is worsened with a large breath, palpation and certain movements. 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 Symptoms consistent with musculoskeletal chest wall pain. Negative Trop and normal EKG. The plan is to perform a delta Troponin. If normal patient can go home.  4:26 pm Delta Trop.   5:35 pm Second troponin is negatrive. Pt is nauseous but now having pain unless she takes a large breath or raises her arms. She is to follow-up with her PCP on Monday. Has Mobic and Ultram at home which she prefers to take. She and her friend feel comfortable with the work up and feel comfortable with her going home.   63 y.o.Kelly Taylor's  evaluation in the Emergency Department is complete. It has been determined that no acute conditions requiring further emergency intervention are present at this time. The patient/guardian have been advised of the diagnosis and plan. We have discussed signs and symptoms that warrant return to the ED, such as changes or worsening in symptoms.  Vital signs are stable at discharge. Filed Vitals:   04/25/14 1630  BP: 93/51  Pulse: 75  Temp:   Resp: 13    Patient/guardian has voiced understanding and agreed to follow-up with the PCP or specialist.   Dorthula Matas, PA-C 04/25/14 1736

## 2014-04-25 NOTE — ED Notes (Signed)
Pt to radiology.

## 2014-04-25 NOTE — ED Provider Notes (Signed)
CSN: [id number]     Arrival date & time 04/25/14  1200 History   First MD Initiated Contact with Patient 04/25/14 1211     Chief Complaint  Patient presents with  . Abdominal Pain     (Consider location/radiation/quality/duration/timing/severity/associated sxs/prior Treatment) HPI  63 year old morbidly obese female with history of noncancerous breast mass, non- insulin-dependent diabetes who was brought here via EMS for evaluation of pleuritic chest pain.  Patient report approximately an hour ago she was walking in [employer] when she developed acute onset of right-sided pleuritic chest pain, with shortness of breath. Patient reports she felt lightheadedness and diaphoretic, and having to sit down. A nurse who was nearby address the patient and contacted EMS to bring patient in for further evaluation. Patient reports pain is worse with deep inspiration, she has never had this pain before. Pain is nonradiating it 10 out of 10. She's had a nonproductive cough for the past 3-4 weeks. She denies having fever, chills, headache , runny nose, sneezing, abdominal pain, back pain, numbness or weakness. She denies any recent trauma. No prior history of PE or DVT. No recent surgery, prolonged bed rest, history of cancer, birth control use, unilateral leg swelling or calf pain. No significant cardiac disease. Patient is nonsmoker.  Past Medical History  Diagnosis Date  . Arthritis   . Hx of bladder infections   . History of measles, mumps, or rubella   . History of chicken pox   . Trichomonas   . Yeast in stool   . Menopausal symptoms 2003  . Breast mass, left 2003  . Urge incontinence 2005  . Obesity, morbid 2006  . Hypothyroidism   . Chronic back pain     seeing Pain specialist  . Hemorrhoids   . Polyp of colon     removed during colonoscopy  . Vertigo     over a year was last episode  . Irritable bowel syndrome   . H/O scarlet fever   . GERD (gastroesophageal reflux disease)   . Diabetes  mellitus     borderline  . DDD (degenerative disc disease)    Past Surgical History  Procedure Laterality Date  . Cholecystectomy    . Hernia repair    . Knee surgery      bilateral knee; x3 right/ x2 left  . Rotator cuff repair      right, almost complete reconstruction  . Breast reduction surgery    . Dilation and curettage of uterus    . Abdominal hysterectomy  02/1990  . Interstem implant      for urinary incontinence  . Hand surgery      right-Dr. Amanda Pea  . Tonsillectomy    . Bladder suspension    . Total knee arthroplasty Right 04/07/2013    Procedure: RIGHT TOTAL KNEE ARTHROPLASTY;  Surgeon: Loanne Drilling, MD;  Location: [facility];  Service: Orthopedics;  Laterality: Right;  . Interstim implant placement Right     for incontinence  . Breast lumpectomy Left 2003  . Foot surgery Right     pinched nerve  . Appendectomy  age 37  . Total knee arthroplasty Left 09/22/2013    Procedure: LEFT TOTAL KNEE ARTHROPLASTY;  Surgeon: Loanne Drilling, MD;  Location: [facility];  Service: Orthopedics;  Laterality: Left;   Family History  Problem Relation Age of Onset  . Lung cancer Mother   . Stroke Paternal Grandfather   . Ovarian cancer Maternal Aunt   . Breast cancer Maternal Aunt  History  Substance Use Topics  . Smoking status: Never Smoker   . Smokeless tobacco: Never Used  . Alcohol Use: No   OB History   Grav Para Term Preterm Abortions TAB SAB Ect Mult Living   3              Review of Systems  All other systems reviewed and are negative.     Allergies  Penicillins; Oxycodone hcl; Relafen; and Sulfa antibiotics  Home Medications   Prior to Admission medications   Medication Sig Start Date End Date Taking? Authorizing Provider  calcium carbonate (OS-CAL) 600 MG TABS tablet Take 600 mg by mouth daily with breakfast.    Historical Provider, MD  docusate sodium (COLACE) 100 MG capsule Take 100 mg by mouth daily as needed for constipation.    Historical Provider,  MD  fluticasone (FLONASE) 50 MCG/ACT nasal spray Place 2 sprays into the nose daily as needed (for nasal congestion).     Historical Provider, MD  HYDROcodone-acetaminophen (NORCO) 7.5-325 MG per tablet Take 1-2 tablets by mouth every 4 (four) hours as needed for moderate pain. 09/24/13   Avel Peace, PA-C  HYDROcodone-acetaminophen (NORCO/VICODIN) 5-325 MG per tablet Take 2 tablets by mouth every 6 (six) hours as needed for severe pain. 02/11/14   Mora Bellman, PA-C  levothyroxine (SYNTHROID, LEVOTHROID) 25 MCG tablet Take 25 mcg by mouth daily before breakfast.     Historical Provider, MD  lidocaine (LIDODERM) 5 % Place 1 patch onto the skin daily. Remove & Discard patch within 12 hours or as directed by MD    Historical Provider, MD  lubiprostone (AMITIZA) 24 MCG capsule Take 24 mcg by mouth 2 (two) times daily with a meal.     Historical Provider, MD  meloxicam (MOBIC) 15 MG tablet Take 15 mg by mouth daily.    Historical Provider, MD  metFORMIN (GLUCOPHAGE) 500 MG tablet Take 500 mg by mouth 2 (two) times daily with a meal.    Historical Provider, MD  methocarbamol (ROBAXIN) 500 MG tablet Take 1 tablet (500 mg total) by mouth every 6 (six) hours as needed for muscle spasms. 09/24/13   Avel Peace, PA-C  Multiple Vitamins-Minerals (MULTIVITAMIN WITH MINERALS) tablet Take 1 tablet by mouth daily.    Historical Provider, MD  Omega-3 Fatty Acids (FISH OIL) 1000 MG CAPS Take 1 capsule by mouth daily.    Historical Provider, MD  omeprazole (PRILOSEC) 20 MG capsule Take 20 mg by mouth daily.    Historical Provider, MD  ondansetron (ZOFRAN) 4 MG tablet Take 1 tablet (4 mg total) by mouth every 6 (six) hours. 02/11/14   Mora Bellman, PA-C  polyethylene glycol (MIRALAX / GLYCOLAX) packet Take 17 g by mouth daily as needed for mild constipation (for constipation).     Historical Provider, MD  Probiotic Product (PROBIOTIC DAILY PO) Take 1 capsule by mouth daily.    Historical Provider, MD  solifenacin  (VESICARE) 10 MG tablet Take 10 mg by mouth daily.     Historical Provider, MD  traMADol (ULTRAM) 50 MG tablet Take 1 tablet (50 mg total) by mouth every 8 (eight) hours as needed (mild pain). 09/24/13   Avel Peace, PA-C   BP 111/91  Pulse 88  Temp(Src) 98.1 F (36.7 C) (Oral)  SpO2 98% Physical Exam  Nursing note and vitals reviewed. Constitutional: She is oriented to person, place, and time. She appears well-developed and well-nourished. No distress.  Moderately obese Caucasian female appears to be in  no acute distress.  HENT:  Head: Atraumatic.  Mouth/Throat: Oropharynx is clear and moist.  Eyes: Conjunctivae are normal.  Neck: Neck supple. No JVD present.  Cardiovascular: Normal rate and regular rhythm.  Exam reveals no gallop and no friction rub.   No murmur heard. Pulmonary/Chest:  Patient with poor effort on breathing however no obvious rales, rhonchi, wheezes heard.  Chaperone present: Anterior chest wall was inspected. Reproducible pain below right breast without any overlying skin changes, emphysema, or crepitus.  Abdominal: Soft. There is no tenderness.  Musculoskeletal: She exhibits no edema.  Neurological: She is alert and oriented to person, place, and time.  Skin: No rash noted.  Psychiatric: She has a normal mood and affect.    ED Course  Procedures (including critical care time)  Patient presents with acute onset pleuritic chest pain with this exertion. She is not tachycardic but giving her story, and her age, I cannot rule out PE. Therefore, d-dimer ordered. Workup initiated. EKG ordered. Chest x-ray ordered, provide pain medication to treat her symptoms. Her pain is reproducible on exam, likely MSK. Care discussed with Dr. Fredderick PhenixBelfi  2:42 PM Patient has an elevated d-dimer of 1.1. Her chest x-ray shows no acute abnormalities. Her EKG without any acute ischemic changes, troponin is negative, her labs are reassuring. A chest CT angiogram was obtained to rule out PE.  This test showed no evidence of PE. No other acute finding.  Pt has a HEART score of 2 ,which is low risk for ACS. Plan to obtain delta trop and repeat ECG at 1600.    3:39 PM Care discussed with oncoming provider, who will obtain delta trop and repeat ECG and will dispo pending result.  Pt made aware of plan.     Labs Review Labs Reviewed  D-DIMER, QUANTITATIVE - Abnormal; Notable for the following:    D-Dimer, Quant 1.11 (*)    All other components within normal limits  I-STAT CHEM 8, ED - Abnormal; Notable for the following:    Glucose, Bld 141 (*)    All other components within normal limits  CBC WITH DIFFERENTIAL  I-STAT TROPOININ, ED    Imaging Review Dg Chest 2 View  04/25/2014   CLINICAL DATA:  Right-sided chest pain, shortness of breath  EXAM: CHEST  2 VIEW  COMPARISON:  Prior chest x-ray 04/01/2013  FINDINGS: Stable cardiac and mediastinal contours. No focal airspace consolidation, pleural effusion or pneumothorax. No evidence of edema. No acute osseous abnormality. Degenerative spurring throughout the thoracic spine. Mild central bronchitic changes remain unchanged.  IMPRESSION: Stable chest x-ray without evidence of acute cardiopulmonary process.   Electronically Signed   By: Malachy MoanHeath  McCullough M.D.   On: 04/25/2014 13:45   Ct Angio Chest Pe W/cm &/or Wo Cm  04/25/2014   CLINICAL DATA:  There is no edema or consolidation. On axial slice 74 series 7, there is a stable 4 mm nodular opacity in the posterior segment of the right lower lobe.  EXAM: CT ANGIOGRAPHY CHEST WITH CONTRAST  TECHNIQUE: Multidetector CT imaging of the chest was performed using the standard protocol during bolus administration of intravenous contrast. Multiplanar CT image reconstructions and MIPs were obtained to evaluate the vascular anatomy.  CONTRAST:  100mL OMNIPAQUE IOHEXOL 350 MG/ML SOLN  COMPARISON:  Chest CT September 02, 2005 and chest radiograph April 25, 2014  FINDINGS: There is no demonstrable pulmonary  embolus. There is no appreciable thoracic aortic aneurysm or dissection.  There is patchy bibasilar atelectatic change. On axial slice  74 series 7, there is a stable 4 mm nodular opacity in the posterior segment left lower lobe. There is no edema or consolidation.  There is no appreciable thoracic adenopathy. The pericardium is not thickened.  In the visualized upper abdomen, no abnormality is appreciable. There is degenerative change in the thoracic spine and in the right shoulder region12. There are no blastic or lytic bone lesions. There is slight anterolisthesis of T2 on T3, probably due to underlying spondylosis.  Review of the MIP images confirms the above findings.  IMPRESSION: No demonstrable pulmonary embolus. Areas of atelectatic change bilaterally. No edema or consolidation. Stable 4 mm nodular opacity left base. Stability over this time interval is consistent with benign etiology.  Slight anterolisthesis of T2 on T3 is probably due to spondylosis. Note that there is degenerative type change in the thoracic spine with diffuse idiopathic skeletal hyperostosis in the lower thoracic/upper lumbar region. There is arthropathy in the right shoulder is well.   Electronically Signed   By: Bretta Bang M.D.   On: 04/25/2014 14:29     EKG Interpretation   Date/Time:  Saturday April 25 2014 12:12:37 EDT Ventricular Rate:  86 PR Interval:  139 QRS Duration: 101 QT Interval:  376 QTC Calculation: 450 R Axis:   13 Text Interpretation:  Sinus rhythm Low voltage, precordial leads since  last tracing no significant change Confirmed by BELFI  MD, MELANIE ([id number])  on 04/25/2014 12:33:40 PM      MDM   Final diagnoses:  Pleuritic chest pain    BP 99/54  Pulse 79  Temp(Src) 98.1 F (36.7 C) (Oral)  Resp 16  SpO2 97%  I have reviewed nursing notes and vital signs. I personally reviewed the imaging tests through PACS system  I reviewed available ER/hospitalization records thought the  EMR     Fayrene Helper, [place] 04/26/14 9924

## 2014-04-25 NOTE — ED Notes (Addendum)
Initial Contact - pt A+Ox4, reports was walking around [employer] with sudden onset pain under R breast.  Pt reports "it was so bad it was hard to breath".  Pt reports sat down but pain was unrelieved and a bystander "though i had a ruptured aneurysm".  Pt reports pain 10/10 at this time, holding RUQ/R breast area.  Pt denies radiation of pain, reports pain worse with deep inspiration and movement.  Abd S/NT/ND, obese.  Pt denies n/v/d/c.  Pt reports eating/drinking well recently, ate a sausage sandwich earlier today. Pt denies CP/SOB at this time.  Speaking full/clear sentences, rr even/un-lab.  LSCTAB.  Skin pale/w/d.  MAEI.  NAD.  Changed to hospital gown, placed to cardiac/02 monitor.

## 2014-04-26 NOTE — ED Provider Notes (Signed)
Medical screening examination/treatment/procedure(s) were performed by non-physician practitioner and as supervising physician I was immediately available for consultation/collaboration.   EKG Interpretation   Date/Time:  Saturday April 25 2014 12:12:37 EDT Ventricular Rate:  86 PR Interval:  139 QRS Duration: 101 QT Interval:  376 QTC Calculation: 450 R Axis:   13 Text Interpretation:  Sinus rhythm Low voltage, precordial leads since  last tracing no significant change Confirmed by BELFI  MD, MELANIE ([id number])  on 04/25/2014 12:33:40 PM       Hurman Horn, MD 04/26/14 1430

## 2014-04-28 NOTE — ED Provider Notes (Signed)
Medical screening examination/treatment/procedure(s) were performed by non-physician practitioner and as supervising physician I was immediately available for consultation/collaboration.   EKG Interpretation   Date/Time:  Saturday April 25 2014 12:12:37 EDT Ventricular Rate:  86 PR Interval:  139 QRS Duration: 101 QT Interval:  376 QTC Calculation: 450 R Axis:   13 Text Interpretation:  Sinus rhythm Low voltage, precordial leads since  last tracing no significant change Confirmed by Shannah Conteh  MD, Torryn Fiske ([id number])  on 04/25/2014 12:33:40 PM        Rolan Bucco, MD 04/28/14 [phone]

## 2014-05-05 ENCOUNTER — Encounter (HOSPITAL_COMMUNITY): Payer: Self-pay | Admitting: Emergency Medicine

## 2014-05-05 ENCOUNTER — Other Ambulatory Visit (HOSPITAL_COMMUNITY): Payer: Self-pay | Admitting: Emergency Medicine

## 2014-05-05 ENCOUNTER — Ambulatory Visit (HOSPITAL_COMMUNITY)
Admission: RE | Admit: 2014-05-05 | Discharge: 2014-05-05 | Disposition: A | Discharge status: Home / Self Care | Payer: BC Managed Care – PPO | Source: Ambulatory Visit | Attending: Family Medicine | Admitting: Family Medicine

## 2014-05-05 ENCOUNTER — Emergency Department (INDEPENDENT_AMBULATORY_CARE_PROVIDER_SITE_OTHER)
Admission: EM | Admit: 2014-05-05 | Discharge: 2014-05-05 | Disposition: A | Discharge status: Home / Self Care | Payer: BC Managed Care – PPO | Source: Home / Self Care

## 2014-05-05 DIAGNOSIS — M7989 Other specified soft tissue disorders: Secondary | ICD-10-CM | POA: Insufficient documentation

## 2014-05-05 DIAGNOSIS — L299 Pruritus, unspecified: Secondary | ICD-10-CM

## 2014-05-05 DIAGNOSIS — R600 Localized edema: Secondary | ICD-10-CM

## 2014-05-05 DIAGNOSIS — S90569A Insect bite (nonvenomous), unspecified ankle, initial encounter: Secondary | ICD-10-CM

## 2014-05-05 DIAGNOSIS — S80869A Insect bite (nonvenomous), unspecified lower leg, initial encounter: Secondary | ICD-10-CM

## 2014-05-05 DIAGNOSIS — R609 Edema, unspecified: Secondary | ICD-10-CM

## 2014-05-05 DIAGNOSIS — W57XXXA Bitten or stung by nonvenomous insect and other nonvenomous arthropods, initial encounter: Principal | ICD-10-CM

## 2014-05-05 MED ORDER — TRIAMCINOLONE ACETONIDE 0.1 % EX CREA: 1.0000 "application " | TOPICAL_CREAM | Freq: Two times a day (BID) | CUTANEOUS | Status: DC

## 2014-05-05 MED ORDER — DOXYCYCLINE HYCLATE 100 MG PO CAPS: 100.0000 mg | ORAL_CAPSULE | Freq: Two times a day (BID) | ORAL | Status: DC

## 2014-05-05 NOTE — ED Provider Notes (Signed)
CSN: [id number]     Arrival date & time 05/05/14  1054 History   First MD Initiated Contact with Patient 05/05/14 1135     Chief Complaint  Patient presents with  . Insect Bite   (Consider location/radiation/quality/duration/timing/severity/associated sxs/prior Treatment) HPI Comments: 63 year old morbidly obese female awoke 4 days ago with a small round area of purplish color located to the left lateral lower leg. This discoloration continued to increase in size until it has now formed an elongated area of ecchymotic discoloration with underlying induration and intermixed erythema. Approximately 7 cm distal to this lesion is an additional annular purplish lesion. Yesterday, a cutaneous light erythema developed over the left anterior knee. These lesions are tender to palpation.   Past Medical History  Diagnosis Date  . Arthritis   . Hx of bladder infections   . History of measles, mumps, or rubella   . History of chicken pox   . Trichomonas   . Yeast in stool   . Menopausal symptoms 2003  . Breast mass, left 2003  . Urge incontinence 2005  . Obesity, morbid 2006  . Hypothyroidism   . Chronic back pain     seeing Pain specialist  . Hemorrhoids   . Polyp of colon     removed during colonoscopy  . Vertigo     over a year was last episode  . Irritable bowel syndrome   . H/O scarlet fever   . GERD (gastroesophageal reflux disease)   . Diabetes mellitus     borderline  . DDD (degenerative disc disease)    Past Surgical History  Procedure Laterality Date  . Cholecystectomy    . Hernia repair    . Knee surgery      bilateral knee; x3 right/ x2 left  . Rotator cuff repair      right, almost complete reconstruction  . Breast reduction surgery    . Dilation and curettage of uterus    . Abdominal hysterectomy  02/1990  . Interstem implant      for urinary incontinence  . Hand surgery      right-Dr. Amanda PeaGramig  . Tonsillectomy    . Bladder suspension    . Total knee arthroplasty  Right 04/07/2013    Procedure: RIGHT TOTAL KNEE ARTHROPLASTY;  Surgeon: Loanne DrillingFrank V Aluisio, MD;  Location: [facility];  Service: Orthopedics;  Laterality: Right;  . Interstim implant placement Right     for incontinence  . Breast lumpectomy Left 2003  . Foot surgery Right     pinched nerve  . Appendectomy  age 63  . Total knee arthroplasty Left 09/22/2013    Procedure: LEFT TOTAL KNEE ARTHROPLASTY;  Surgeon: Loanne DrillingFrank V Aluisio, MD;  Location: [facility];  Service: Orthopedics;  Laterality: Left;   Family History  Problem Relation Age of Onset  . Lung cancer Mother   . Stroke Paternal Grandfather   . Ovarian cancer Maternal Aunt   . Breast cancer Maternal Aunt    History  Substance Use Topics  . Smoking status: Never Smoker   . Smokeless tobacco: Never Used  . Alcohol Use: No   OB History   Grav Para Term Preterm Abortions TAB SAB Ect Mult Living   3              Review of Systems  Constitutional: Negative.   HENT: Negative.   Respiratory: Negative.  Negative for cough, shortness of breath and wheezing.   Cardiovascular: Positive for leg swelling. Negative for chest pain and  palpitations.  Genitourinary: Negative.   Musculoskeletal: Negative for back pain, joint swelling and neck pain.  Skin: Positive for color change.  Neurological: Negative.     Allergies  Penicillins; Oxycodone hcl; Relafen; and Sulfa antibiotics  Home Medications   Prior to Admission medications   Medication Sig Start Date End Date Taking? Authorizing Provider  calcium carbonate (OS-CAL) 600 MG TABS tablet Take 600 mg by mouth daily with breakfast.    Historical Provider, MD  docusate sodium (COLACE) 100 MG capsule Take 100 mg by mouth daily as needed for constipation.    Historical Provider, MD  doxycycline (VIBRAMYCIN) 100 MG capsule Take 1 capsule (100 mg total) by mouth 2 (two) times daily. 05/05/14   Hayden Rasmussen, NP  fluticasone (FLONASE) 50 MCG/ACT nasal spray Place 2 sprays into the nose daily as needed (for  nasal congestion).     Historical Provider, MD  HYDROcodone-acetaminophen (NORCO/VICODIN) 5-325 MG per tablet Take 1-2 tablets by mouth every 4 (four) hours as needed. 04/25/14   Dorthula Matas, PA-C  levothyroxine (SYNTHROID, LEVOTHROID) 50 MCG tablet Take 50 mcg by mouth daily before breakfast.    Historical Provider, MD  lidocaine (LIDODERM) 5 % Place 1 patch onto the skin daily. Remove & Discard patch within 12 hours or as directed by MD    Historical Provider, MD  lubiprostone (AMITIZA) 24 MCG capsule Take 24 mcg by mouth 2 (two) times daily with a meal.     Historical Provider, MD  meloxicam (MOBIC) 15 MG tablet Take 1 tablet (15 mg total) by mouth daily. 04/25/14   Fayrene Helper, PA-C  metFORMIN (GLUCOPHAGE) 500 MG tablet Take 500 mg by mouth 2 (two) times daily with a meal.    Historical Provider, MD  methocarbamol (ROBAXIN) 500 MG tablet Take 1 tablet (500 mg total) by mouth every 6 (six) hours as needed for muscle spasms. 09/24/13   Avel Peace, PA-C  Multiple Vitamins-Minerals (MULTIVITAMIN WITH MINERALS) tablet Take 1 tablet by mouth daily.    Historical Provider, MD  Omega-3 Fatty Acids (FISH OIL) 1000 MG CAPS Take 1 capsule by mouth daily.    Historical Provider, MD  omeprazole (PRILOSEC) 20 MG capsule Take 20 mg by mouth daily.    Historical Provider, MD  polyethylene glycol (MIRALAX / GLYCOLAX) packet Take 17 g by mouth daily as needed for mild constipation (for constipation).     Historical Provider, MD  Probiotic Product (PROBIOTIC DAILY PO) Take 1 capsule by mouth daily.    Historical Provider, MD  solifenacin (VESICARE) 10 MG tablet Take 10 mg by mouth daily.     Historical Provider, MD  traMADol (ULTRAM) 50 MG tablet Take 1 tablet (50 mg total) by mouth every 8 (eight) hours as needed (mild pain). 09/24/13   Avel Peace, PA-C  triamcinolone cream (KENALOG) 0.1 % Apply 1 application topically 2 (two) times daily. Apply for 2 weeks. May use on face 05/05/14   Hayden Rasmussen, NP   BP 119/68   Pulse 73  Temp(Src) 98.4 F (36.9 C) (Oral)  Resp 20  SpO2 98% Physical Exam  Nursing note and vitals reviewed. Constitutional: She is oriented to person, place, and time. She appears well-developed and well-nourished.  Neck: Neck supple.  Cardiovascular: Normal rate.   Pulmonary/Chest: Effort normal. No respiratory distress.  Musculoskeletal:       Legs: Edema to the left lower extremity below the knee. The affected left lower leg measures 49.5 cm in circumference and the right lower leg measures  45.5 cm. Distal N/V, M/S intact. Pedal pulse 2+. Circulation intact. No proximal lymphangitis.    Neurological: She is alert and oriented to person, place, and time.  Skin: Skin is warm and dry.  Lesions to the left lower leg as described in history of present illness.  Psychiatric: She has a normal mood and affect.    ED Course  Procedures (including critical care time) Labs Review Labs Reviewed - No data to display  Imaging Review No results found.   MDM   1. Insect bite of leg   2. Leg edema, left   3. Itching    Send to hospital for venus duplex of LLE. Results no DVT. Results not obtained until 3:15 PM. Ice locally Kenalog cream bid for itching Doxycycline po Am not certain of the etiology. May be a local reaction to a bite or sting. Possibly associated with infection. Must see PCP 2 days . If unable return or go to the ED if worse, fever, red streaks.    Hayden Rasmussenavid Adewale Pucillo, NP 05/05/14 [id number]  Hayden Rasmussenavid Honestie Kulik, NP 05/05/14 [phone]

## 2014-05-05 NOTE — Progress Notes (Signed)
VASCULAR LAB PRELIMINARY  PRELIMINARY  PRELIMINARY  PRELIMINARY  Left lower extremity venous duplex completed.    Preliminary report:  Left leg is negative for deep and superficial vein thrombosis.   NICHOLS, FRANCES, RVT 05/05/2014, 1:35 PM

## 2014-05-05 NOTE — Discharge Instructions (Signed)
Edema Edema is an abnormal build-up of fluids in tissues. Because this is partly dependent on gravity (water flows to the lowest place), it is more common in the legs and thighs (lower extremities). It is also common in the looser tissues, like around the eyes. Painless swelling of the feet and ankles is common and increases as a person ages. It may affect both legs and may include the calves or even thighs. When squeezed, the fluid may move out of the affected area and may leave a dent for a few moments. CAUSES   Prolonged standing or sitting in one place for extended periods of time. Movement helps pump tissue fluid into the veins, and absence of movement prevents this, resulting in edema.  Varicose veins. The valves in the veins do not work as well as they should. This causes fluid to leak into the tissues.  Fluid and salt overload.  Injury, burn, or surgery to the leg, ankle, or foot, may damage veins and allow fluid to leak out.  Sunburn damages vessels. Leaky vessels allow fluid to go out into the sunburned tissues.  Allergies (from insect bites or stings, medications or chemicals) cause swelling by allowing vessels to become leaky.  Protein in the blood helps keep fluid in your vessels. Low protein, as in malnutrition, allows fluid to leak out.  Hormonal changes, including pregnancy and menstruation, cause fluid retention. This fluid may leak out of vessels and cause edema.  Medications that cause fluid retention. Examples are sex hormones, blood pressure medications, steroid treatment, or anti-depressants.  Some illnesses cause edema, especially heart failure, kidney disease, or liver disease.  Surgery that cuts veins or lymph nodes, such as surgery done for the heart or for breast cancer, may result in edema. DIAGNOSIS  Your caregiver is usually easily able to determine what is causing your swelling (edema) by simply asking what is wrong (getting a history) and examining you (doing  a physical). Sometimes x-rays, EKG (electrocardiogram or heart tracing), and blood work may be done to evaluate for underlying medical illness. TREATMENT  General treatment includes:  Leg elevation (or elevation of the affected body part).  Restriction of fluid intake.  Prevention of fluid overload.  Compression of the affected body part. Compression with elastic bandages or support stockings squeezes the tissues, preventing fluid from entering and forcing it back into the blood vessels.  Diuretics (also called water pills or fluid pills) pull fluid out of your body in the form of increased urination. These are effective in reducing the swelling, but can have side effects and must be used only under your caregiver's supervision. Diuretics are appropriate only for some types of edema. The specific treatment can be directed at any underlying causes discovered. Heart, liver, or kidney disease should be treated appropriately. HOME CARE INSTRUCTIONS   Elevate the legs (or affected body part) above the level of the heart, while lying down.  Avoid sitting or standing still for prolonged periods of time.  Avoid putting anything directly under the knees when lying down, and do not wear constricting clothing or garters on the upper legs.  Exercising the legs causes the fluid to work back into the veins and lymphatic channels. This may help the swelling go down.  The pressure applied by elastic bandages or support stockings can help reduce ankle swelling.  A low-salt diet may help reduce fluid retention and decrease the ankle swelling.  Take any medications exactly as prescribed. SEEK MEDICAL CARE IF:  Your edema is  not responding to recommended treatments. SEEK IMMEDIATE MEDICAL CARE IF:   You develop shortness of breath or chest pain.  You cannot breathe when you lay down; or if, while lying down, you have to get up and go to the window to get your breath.  You are having increasing  swelling without relief from treatment.  You develop a fever over 102 F (38.9 C).  You develop pain or redness in the areas that are swollen.  Tell your caregiver right away if you have gained 03 lb/1.4 kg in 1 day or 05 lb/2.3 kg in a week. MAKE SURE YOU:   Understand these instructions.  Will watch your condition.  Will get help right away if you are not doing well or get worse. Document Released: 11/06/2005 Document Revised: 05/07/2012 Document Reviewed: 06/24/2008 [facility] Patient Information 2014 [place], [place].  Insect Bite Mosquitoes, flies, fleas, bedbugs, and many other insects can bite. Insect bites are different from insect stings. A sting is when venom is injected into the skin. Some insect bites can transmit infectious diseases. SYMPTOMS  Insect bites usually turn red, swell, and itch for 2 to 4 days. They often go away on their own. TREATMENT  Your caregiver may prescribe antibiotic medicines if a bacterial infection develops in the bite. HOME CARE INSTRUCTIONS  Do not scratch the bite area.  Keep the bite area clean and dry. Wash the bite area thoroughly with soap and water.  Put ice or cool compresses on the bite area.  Put ice in a plastic bag.  Place a towel between your skin and the bag.  Leave the ice on for 20 minutes, 4 times a day for the first 2 to 3 days, or as directed.  You may apply a baking soda paste, cortisone cream, or calamine lotion to the bite area as directed by your caregiver. This can help reduce itching and swelling.  Only take over-the-counter or prescription medicines as directed by your caregiver.  If you are given antibiotics, take them as directed. Finish them even if you start to feel better. You may need a tetanus shot if:  You cannot remember when you had your last tetanus shot.  You have never had a tetanus shot.  The injury broke your skin. If you get a tetanus shot, your arm may swell, get red, and feel warm to the  touch. This is common and not a problem. If you need a tetanus shot and you choose not to have one, there is a rare chance of getting tetanus. Sickness from tetanus can be serious. SEEK IMMEDIATE MEDICAL CARE IF:   You have increased pain, redness, or swelling in the bite area.  You see a red line on the skin coming from the bite.  You have a fever.  You have joint pain.  You have a headache or neck pain.  You have unusual weakness.  You have a rash.  You have chest pain or shortness of breath.  You have abdominal pain, nausea, or vomiting.  You feel unusually tired or sleepy. MAKE SURE YOU:   Understand these instructions.  Will watch your condition.  Will get help right away if you are not doing well or get worse. Document Released: 12/14/2004 Document Revised: 01/29/2012 Document Reviewed: 06/07/2011 [facility] Patient Information 2014 [place], [place].  Pruritus  Pruritis is an itch. There are many different problems that can cause an itch. Dry skin is one of the most common causes of itching. Most cases of itching  do not require medical attention.  HOME CARE INSTRUCTIONS  Make sure your skin is moistened on a regular basis. A moisturizer that contains petroleum jelly is best for keeping moisture in your skin. If you develop a rash, you may try the following for relief:   Use corticosteroid cream.  Apply cool compresses to the affected areas.  Bathe with Epsom salts or baking soda in the bathwater.  Soak in colloidal oatmeal baths. These are available at your pharmacy.  Apply baking soda paste to the rash. Stir water into baking soda until it reaches a paste-like consistency.  Use an anti-itch lotion.  Take over-the-counter diphenhydramine medicine by mouth as the instructions direct.  Avoid scratching. Scratching may cause the rash to become infected. If itching is very bad, your caregiver may suggest prescription lotions or creams to lessen your  symptoms.  Avoid hot showers, which can make itching worse. A cold shower may help with itching as long as you use a moisturizer after the shower. SEEK MEDICAL CARE IF: The itching does not go away after several days. Document Released: 07/19/2011 Document Revised: 01/29/2012 Document Reviewed: 07/19/2011 [facility] Patient Information 2014 [place], [place].  Spider Bite Spider bites are not common. Most spider bites do not cause serious problems. The elderly, very young children, and people with certain existing medical conditions are more likely to experience significant symptoms. SYMPTOMS  Spider bites may not cause any pain at first. Within 1 or 2 days of the bite, there may be swelling, redness, and pain in the bite area. However, some spider bites can cause pain within the first hour. TREATMENT  Your caregiver may prescribe antibiotic medicine if a bacterial infection develops in the bite. However, not all spider bites require antibiotics or prescription medicines.  HOME CARE INSTRUCTIONS  Do not scratch the bite area.  Keep the bite area clean and dry. Wash the area with soap and water as directed.  Put ice or cool compresses on the bite area.  Put ice in a plastic bag.  Place a towel between your skin and the bag.  Leave the ice on for 20 minutes, 4 times a day for the first 2 to 3 days, or as directed.  Keep the bite area elevated above the level of your heart. This helps reduce redness and swelling.  Only take over-the-counter or prescription medicines as directed by your caregiver.  If you are given antibiotics, take them as directed. Finish them even if you start to feel better. You may need a tetanus shot if:  You cannot remember when you had your last tetanus shot.  You have never had a tetanus shot.  The injury broke your skin. If you get a tetanus shot, your arm may swell, get red, and feel warm to the touch. This is common and not a problem. If you need a tetanus  shot and you choose not to have one, there is a rare chance of getting tetanus. Sickness from tetanus can be serious. SEEK MEDICAL CARE IF: Your bite is not better after 3 days of treatment. SEEK IMMEDIATE MEDICAL CARE IF:  Your bite turns purple or develops increased swelling, pain, or redness.  You develop shortness of breath or chest pain.  You have muscle cramps or painful muscle spasms.  You develop abdominal pain, nausea, or vomiting.  You feel unusually tired or sleepy. MAKE SURE YOU:  Understand these instructions.  Will watch your condition.  Will get help right away if you are not doing  well or get worse. Document Released: 12/14/2004 Document Revised: 01/29/2012 Document Reviewed: 06/07/2011 [facility] Patient Information 2014 [place], [place].  Tick Bite Information Ticks are insects that attach themselves to the skin and draw blood for food. There are various types of ticks. Common types include wood ticks and deer ticks. Most ticks live in shrubs and grassy areas. Ticks can climb onto your body when you make contact with leaves or grass where the tick is waiting. The most common places on the body for ticks to attach themselves are the scalp, neck, armpits, waist, and groin. Most tick bites are harmless, but sometimes ticks carry germs that cause diseases. These germs can be spread to a person during the tick's feeding process. The chance of a disease spreading through a tick bite depends on:   The type of tick.  Time of year.   How long the tick is attached.   Geographic location.  HOW CAN YOU PREVENT TICK BITES? Take these steps to help prevent tick bites when you are outdoors:  Wear protective clothing. Long sleeves and long pants are best.   Wear white clothes so you can see ticks more easily.  Tuck your pant legs into your socks.   If walking on a trail, stay in the middle of the trail to avoid brushing against bushes.  Avoid walking through areas  with long grass.  Put insect repellent on all exposed skin and along boot tops, pant legs, and sleeve cuffs.   Check clothing, hair, and skin repeatedly and before going inside.   Brush off any ticks that are not attached.  Take a shower or bath as soon as possible after being outdoors.  WHAT IS THE PROPER WAY TO REMOVE A TICK? Ticks should be removed as soon as possible to help prevent diseases caused by tick bites. 1. If latex gloves are available, put them on before trying to remove a tick.  2. Using fine-point tweezers, grasp the tick as close to the skin as possible. You may also use curved forceps or a tick removal tool. Grasp the tick as close to its head as possible. Avoid grasping the tick on its body. 3. Pull gently with steady upward pressure until the tick lets go. Do not twist the tick or jerk it suddenly. This may break off the tick's head or mouth parts. 4. Do not squeeze or crush the tick's body. This could force disease-carrying fluids from the tick into your body.  5. After the tick is removed, wash the bite area and your hands with soap and water or other disinfectant such as alcohol. 6. Apply a small amount of antiseptic cream or ointment to the bite site.  7. Wash and disinfect any instruments that were used.  Do not try to remove a tick by applying a hot match, petroleum jelly, or fingernail polish to the tick. These methods do not work and may increase the chances of disease being spread from the tick bite.  WHEN SHOULD YOU SEEK MEDICAL CARE? Contact your health care provider if you are unable to remove a tick from your skin or if a part of the tick breaks off and is stuck in the skin.  After a tick bite, you need to be aware of signs and symptoms that could be related to diseases spread by ticks. Contact your health care provider if you develop any of the following in the days or weeks after the tick bite:  Unexplained fever.  Rash. A circular rash  that  appears days or weeks after the tick bite may indicate the possibility of Lyme disease. The rash may resemble a target with a bull's-eye and may occur at a different part of your body than the tick bite.  Redness and swelling in the area of the tick bite.   Tender, swollen lymph glands.   Diarrhea.   Weight loss.   Cough.   Fatigue.   Muscle, joint, or bone pain.   Abdominal pain.   Headache.   Lethargy or a change in your level of consciousness.  Difficulty walking or moving your legs.   Numbness in the legs.   Paralysis.  Shortness of breath.   Confusion.   Repeated vomiting.  Document Released: 11/03/2000 Document Revised: 08/27/2013 Document Reviewed: 04/16/2013 [facility] Patient Information 2014 [place], [place].

## 2014-05-05 NOTE — ED Notes (Signed)
Patient transported to Ultrasound (spoke w/Cindy)

## 2014-05-05 NOTE — ED Notes (Signed)
Pt c/o poss spider/insect bite to lower left extremity Reports she woke up Friday am and noticed rash Sx include itchiness and pain Denies fevers; ambulated well to exam room w/NAD Alert w/no signs of acute distress.

## 2014-05-08 NOTE — ED Provider Notes (Signed)
Medical screening examination/treatment/procedure(s) were performed by resident physician or non-physician practitioner and as supervising physician I was immediately available for consultation/collaboration.   KINDL,JAMES DOUGLAS MD.   James D Kindl, MD 05/08/14 1643 

## 2014-07-17 ENCOUNTER — Encounter (HOSPITAL_COMMUNITY): Payer: Self-pay | Admitting: Pharmacy Technician

## 2014-07-17 ENCOUNTER — Other Ambulatory Visit (HOSPITAL_COMMUNITY): Payer: Self-pay | Admitting: Orthopaedic Surgery

## 2014-07-21 ENCOUNTER — Encounter (HOSPITAL_COMMUNITY)
Admission: RE | Admit: 2014-07-21 | Discharge: 2014-07-21 | Disposition: A | Discharge status: Home / Self Care | Payer: BC Managed Care – PPO | Source: Ambulatory Visit | Attending: Orthopaedic Surgery | Admitting: Orthopaedic Surgery

## 2014-07-21 ENCOUNTER — Encounter (HOSPITAL_COMMUNITY): Payer: Self-pay

## 2014-07-21 DIAGNOSIS — Z01818 Encounter for other preprocedural examination: Secondary | ICD-10-CM | POA: Insufficient documentation

## 2014-07-21 DIAGNOSIS — M1610 Unilateral primary osteoarthritis, unspecified hip: Secondary | ICD-10-CM | POA: Insufficient documentation

## 2014-07-21 DIAGNOSIS — M169 Osteoarthritis of hip, unspecified: Secondary | ICD-10-CM | POA: Insufficient documentation

## 2014-07-21 HISTORY — DX: Spinal stenosis, lumbar region without neurogenic claudication: M48.061

## 2014-07-21 HISTORY — DX: Other reaction to spinal and lumbar puncture: G97.1

## 2014-07-21 HISTORY — DX: Unspecified osteoarthritis, unspecified site: M19.90

## 2014-07-21 HISTORY — DX: Adverse effect of unspecified anesthetic, initial encounter: T41.45XA

## 2014-07-21 HISTORY — DX: Disease of pericardium, unspecified: I31.9

## 2014-07-21 HISTORY — DX: Pneumonia, unspecified organism: J18.9

## 2014-07-21 HISTORY — DX: Other complications of anesthesia, initial encounter: T88.59XA

## 2014-07-21 HISTORY — DX: Anemia, unspecified: D64.9

## 2014-07-21 LAB — CBC
HEMATOCRIT: 39 % (ref 36.0–46.0)
HEMOGLOBIN: 12.6 g/dL (ref 12.0–15.0)
MCH: 27.8 pg (ref 26.0–34.0)
MCHC: 32.3 g/dL (ref 30.0–36.0)
MCV: 86.1 fL (ref 78.0–100.0)
Platelets: 195 10*3/uL (ref 150–400)
RBC: 4.53 MIL/uL (ref 3.87–5.11)
RDW: 14.1 % (ref 11.5–15.5)
WBC: 7.6 10*3/uL (ref 4.0–10.5)

## 2014-07-21 LAB — BASIC METABOLIC PANEL
Anion gap: 13 (ref 5–15)
BUN: 15 mg/dL (ref 6–23)
CHLORIDE: 106 meq/L (ref 96–112)
CO2: 25 mEq/L (ref 19–32)
Calcium: 9.1 mg/dL (ref 8.4–10.5)
Creatinine, Ser: 0.64 mg/dL (ref 0.50–1.10)
GFR calc non Af Amer: >90 mL/min (ref >90)
Glucose, Bld: 121 mg/dL — ABNORMAL HIGH (ref 70–99)
POTASSIUM: 3.9 meq/L (ref 3.7–5.3)
Sodium: 144 mEq/L (ref 137–147)

## 2014-07-21 LAB — URINALYSIS, ROUTINE W REFLEX MICROSCOPIC
Bilirubin Urine: NEGATIVE
GLUCOSE, UA: NEGATIVE mg/dL
Hgb urine dipstick: NEGATIVE
Ketones, ur: NEGATIVE mg/dL
Leukocytes, UA: NEGATIVE
Nitrite: NEGATIVE
PH: 5.5 (ref 5.0–8.0)
Protein, ur: NEGATIVE mg/dL
Specific Gravity, Urine: 1.029 (ref 1.005–1.030)
Urobilinogen, UA: 0.2 mg/dL (ref 0.0–1.0)

## 2014-07-21 LAB — SURGICAL PCR SCREEN
MRSA, PCR: NEGATIVE
Staphylococcus aureus: POSITIVE — AB

## 2014-07-21 LAB — PROTIME-INR
INR: 0.98 (ref 0.00–1.49)
PROTHROMBIN TIME: 13 s (ref 11.6–15.2)

## 2014-07-21 LAB — APTT: aPTT: 26 seconds (ref 24–37)

## 2014-07-21 NOTE — Progress Notes (Signed)
Chest x-ray 04/25/14 on EPIC, EKG 04/27/14 on EPIC

## 2014-07-21 NOTE — Patient Instructions (Addendum)
20 Kelly Taylor  [birth year]KAYDAN WILHOITEprocedure is scheduled on: Thursday 07/30/14  Report to [facility] at 08:30 AM.  Call this number if you have problems the morning of surgery [phone]: [phone]   Remember:   Do not eat food or drink liquids After Midnight.     Take these medicines the morning of surgery with A SIP OF WATER: flonase, levothyroxine, hydrocodone if needed   Do not wear jewelry, make-up or nail polish.  Do not wear lotions, powders, or perfumes. You may wear deodorant.  Do not shave 48 hours prior to surgery. Men may shave face and neck.  Do not bring valuables to the hospital.  Contacts, dentures or bridgework may not be worn into surgery.  Leave suitcase in the car. After surgery it may be brought to your room.  For patients admitted to the hospital, checkout time is 11:00 AM the day of discharge.   Please read over the following fact sheets that you were given: MRSA Information Birdie Sons, RN  pre op nurse call if needed [phone]    [facility] - Preparing for Surgery Before surgery, you can play an important role.  Because skin is not sterile, your skin needs to be as free of germs as possible.  You can reduce the number of germs on your skin by washing with CHG (chlorahexidine gluconate) soap before surgery.  CHG is an antiseptic cleaner which kills germs and bonds with the skin to continue killing germs even after washing. Please DO NOT use if you have an allergy to CHG or antibacterial soaps.  If your skin becomes reddened/irritated stop using the CHG and inform your nurse when you arrive at Short Stay. Do not shave (including legs and underarms) for at least 48 hours prior to the first CHG shower.  You may shave your face/neck. Please follow these instructions carefully:  1.  Shower with CHG Soap the night before surgery and the  morning of Surgery.  2.  If you choose to wash your hair, wash your hair first as usual with your  normal   shampoo.  3.  After you shampoo, rinse your hair and body thoroughly to remove the  shampoo.                            4.  Use CHG as you would any other liquid soap.  You can apply chg directly  to the skin and wash                       Gently with a scrungie or clean washcloth.  5.  Apply the CHG Soap to your body ONLY FROM THE NECK DOWN.   Do not use on face/ open                           Wound or open sores. Avoid contact with eyes, ears mouth and genitals (private parts).                       Wash face,  Genitals (private parts) with your normal soap.             6.  Wash thoroughly, paying special attention to the area where your surgery  will be performed.  7.  Thoroughly rinse your body with warm water from the neck down.  8.  DO NOT shower/wash with your normal soap after using and rinsing off  the CHG Soap.                9.  Pat yourself dry with a clean towel.            10.  Wear clean pajamas.            11.  Place clean sheets on your bed the night of your first shower and do not  sleep with pets. Day of Surgery : Do not apply any lotions/deodorants the morning of surgery.  Please wear clean clothes to the hospital/surgery center.  FAILURE TO FOLLOW THESE INSTRUCTIONS MAY RESULT IN THE CANCELLATION OF YOUR SURGERY PATIENT SIGNATURE_________________________________  NURSE SIGNATURE__________________________________  ________________________________________________________________________  WHAT IS A BLOOD TRANSFUSION? Blood Transfusion Information  A transfusion is the replacement of blood or some of its parts. Blood is made up of multiple cells which provide different functions.  Red blood cells carry oxygen and are used for blood loss replacement.  White blood cells fight against infection.  Platelets control bleeding.  Plasma helps clot blood.  Other blood products are available for specialized needs, such as hemophilia or other clotting disorders. BEFORE THE  TRANSFUSION  Who gives blood for transfusions?   Healthy volunteers who are fully evaluated to make sure their blood is safe. This is blood bank blood. Transfusion therapy is the safest it has ever been in the practice of medicine. Before blood is taken from a donor, a complete history is taken to make sure that person has no history of diseases nor engages in risky social behavior (examples are intravenous drug use or sexual activity with multiple partners). The donor's travel history is screened to minimize risk of transmitting infections, such as malaria. The donated blood is tested for signs of infectious diseases, such as HIV and hepatitis. The blood is then tested to be sure it is compatible with you in order to minimize the chance of a transfusion reaction. If you or a relative donates blood, this is often done in anticipation of surgery and is not appropriate for emergency situations. It takes many days to process the donated blood. RISKS AND COMPLICATIONS Although transfusion therapy is very safe and saves many lives, the main dangers of transfusion include:   Getting an infectious disease.  Developing a transfusion reaction. This is an allergic reaction to something in the blood you were given. Every precaution is taken to prevent this. The decision to have a blood transfusion has been considered carefully by your caregiver before blood is given. Blood is not given unless the benefits outweigh the risks. AFTER THE TRANSFUSION  Right after receiving a blood transfusion, you will usually feel much better and more energetic. This is especially true if your red blood cells have gotten low (anemic). The transfusion raises the level of the red blood cells which carry oxygen, and this usually causes an energy increase.  The nurse administering the transfusion will monitor you carefully for complications. HOME CARE INSTRUCTIONS  No special instructions are needed after a transfusion. You may find  your energy is better. Speak with your caregiver about any limitations on activity for underlying diseases you may have. SEEK MEDICAL CARE IF:   Your condition is not improving after your transfusion.  You develop redness or irritation at the intravenous (IV) site. SEEK IMMEDIATE MEDICAL CARE IF:  Any of the following symptoms occur over the next 12 hours:  Shaking chills.  You have a temperature by mouth above 102 F (38.9 C), not controlled by medicine.  Chest, back, or muscle pain.  People around you feel you are not acting correctly or are confused.  Shortness of breath or difficulty breathing.  Dizziness and fainting.  You get a rash or develop hives.  You have a decrease in urine output.  Your urine turns a dark color or changes to pink, red, or brown. Any of the following symptoms occur over the next 10 days:  You have a temperature by mouth above 102 F (38.9 C), not controlled by medicine.  Shortness of breath.  Weakness after normal activity.  The white part of the eye turns yellow (jaundice).  You have a decrease in the amount of urine or are urinating less often.  Your urine turns a dark color or changes to pink, red, or brown. Document Released: 11/03/2000 Document Revised: 01/29/2012 Document Reviewed: 06/22/2008 [facility] Patient Information 2014 [place], [place].  _______________________________________________________________________

## 2014-07-30 ENCOUNTER — Inpatient Hospital Stay (HOSPITAL_COMMUNITY): Payer: BC Managed Care – PPO

## 2014-07-30 ENCOUNTER — Inpatient Hospital Stay (HOSPITAL_COMMUNITY)
Admission: RE | Admit: 2014-07-30 | Discharge: 2014-08-01 | DRG: 470 | Disposition: A | Discharge status: Home Health | Payer: BC Managed Care – PPO | Source: Ambulatory Visit | Attending: Orthopaedic Surgery | Admitting: Orthopaedic Surgery

## 2014-07-30 ENCOUNTER — Encounter (HOSPITAL_COMMUNITY)
Admission: RE | Disposition: A | Discharge status: Home Health | Payer: Self-pay | Source: Ambulatory Visit | Attending: Orthopaedic Surgery

## 2014-07-30 ENCOUNTER — Encounter (HOSPITAL_COMMUNITY): Payer: BC Managed Care – PPO | Admitting: Anesthesiology

## 2014-07-30 ENCOUNTER — Inpatient Hospital Stay (HOSPITAL_COMMUNITY): Payer: BC Managed Care – PPO | Admitting: Anesthesiology

## 2014-07-30 ENCOUNTER — Encounter (HOSPITAL_COMMUNITY): Payer: Self-pay | Admitting: *Deleted

## 2014-07-30 DIAGNOSIS — E119 Type 2 diabetes mellitus without complications: Secondary | ICD-10-CM | POA: Diagnosis present

## 2014-07-30 DIAGNOSIS — M1610 Unilateral primary osteoarthritis, unspecified hip: Principal | ICD-10-CM | POA: Diagnosis present

## 2014-07-30 DIAGNOSIS — Z79899 Other long term (current) drug therapy: Secondary | ICD-10-CM | POA: Diagnosis not present

## 2014-07-30 DIAGNOSIS — Z96659 Presence of unspecified artificial knee joint: Secondary | ICD-10-CM

## 2014-07-30 DIAGNOSIS — M169 Osteoarthritis of hip, unspecified: Principal | ICD-10-CM | POA: Diagnosis present

## 2014-07-30 DIAGNOSIS — Z801 Family history of malignant neoplasm of trachea, bronchus and lung: Secondary | ICD-10-CM | POA: Diagnosis not present

## 2014-07-30 DIAGNOSIS — Z01812 Encounter for preprocedural laboratory examination: Secondary | ICD-10-CM

## 2014-07-30 DIAGNOSIS — Z96641 Presence of right artificial hip joint: Secondary | ICD-10-CM

## 2014-07-30 DIAGNOSIS — Z6841 Body Mass Index (BMI) 40.0 and over, adult: Secondary | ICD-10-CM | POA: Diagnosis not present

## 2014-07-30 DIAGNOSIS — E039 Hypothyroidism, unspecified: Secondary | ICD-10-CM | POA: Diagnosis present

## 2014-07-30 DIAGNOSIS — D62 Acute posthemorrhagic anemia: Secondary | ICD-10-CM | POA: Diagnosis not present

## 2014-07-30 DIAGNOSIS — M25559 Pain in unspecified hip: Secondary | ICD-10-CM | POA: Diagnosis present

## 2014-07-30 DIAGNOSIS — M1611 Unilateral primary osteoarthritis, right hip: Secondary | ICD-10-CM

## 2014-07-30 HISTORY — DX: Presence of right artificial hip joint: Z96.641

## 2014-07-30 HISTORY — PX: TOTAL HIP ARTHROPLASTY: SHX124

## 2014-07-30 LAB — TYPE AND SCREEN
ABO/RH(D): B POS
Antibody Screen: NEGATIVE

## 2014-07-30 LAB — GLUCOSE, CAPILLARY
GLUCOSE-CAPILLARY: 133 mg/dL — AB (ref 70–99)
GLUCOSE-CAPILLARY: 126 mg/dL — AB (ref 70–99)

## 2014-07-30 SURGERY — ARTHROPLASTY, HIP, TOTAL, ANTERIOR APPROACH
Anesthesia: General | Site: Hip | Laterality: Right

## 2014-07-30 MED ORDER — PHENYLEPHRINE HCL 10 MG/ML IJ SOLN
INTRAMUSCULAR | Status: DC | PRN
  Administered 2014-07-30: 80 ug via INTRAVENOUS
  Administered 2014-07-30: 120 ug via INTRAVENOUS
  Administered 2014-07-30: 80 ug via INTRAVENOUS
  Administered 2014-07-30: 120 ug via INTRAVENOUS
  Administered 2014-07-30 (×2): 80 ug via INTRAVENOUS
  Administered 2014-07-30: 120 ug via INTRAVENOUS

## 2014-07-30 MED ORDER — NEOSTIGMINE METHYLSULFATE 10 MG/10ML IV SOLN
INTRAVENOUS | Status: DC | PRN
  Administered 2014-07-30: 4 mg via INTRAVENOUS

## 2014-07-30 MED ORDER — PROPOFOL 10 MG/ML IV BOLUS
INTRAVENOUS | Status: AC
  Filled 2014-07-30: qty 20

## 2014-07-30 MED ORDER — ALUM & MAG HYDROXIDE-SIMETH 200-200-20 MG/5ML PO SUSP
30.0000 mL | ORAL | Status: DC | PRN
Start: 2014-07-30 — End: 2014-08-01

## 2014-07-30 MED ORDER — FLUTICASONE PROPIONATE 50 MCG/ACT NA SUSP
2.0000 | Freq: Every day | NASAL | Status: DC | PRN
  Filled 2014-07-30: qty 16

## 2014-07-30 MED ORDER — ONDANSETRON HCL 4 MG PO TABS: 4.0000 mg | ORAL_TABLET | Freq: Four times a day (QID) | ORAL | Status: DC | PRN

## 2014-07-30 MED ORDER — ONDANSETRON HCL 4 MG/2ML IJ SOLN
INTRAMUSCULAR | Status: AC
  Filled 2014-07-30: qty 2

## 2014-07-30 MED ORDER — DIPHENHYDRAMINE HCL 12.5 MG/5ML PO ELIX: 12.5000 mg | ORAL_SOLUTION | ORAL | Status: DC | PRN

## 2014-07-30 MED ORDER — SUFENTANIL CITRATE 50 MCG/ML IV SOLN
INTRAVENOUS | Status: AC
  Filled 2014-07-30: qty 1

## 2014-07-30 MED ORDER — HYDROMORPHONE HCL PF 2 MG/ML IJ SOLN
INTRAMUSCULAR | Status: AC
  Filled 2014-07-30: qty 1

## 2014-07-30 MED ORDER — ONDANSETRON HCL 4 MG/2ML IJ SOLN
INTRAMUSCULAR | Status: DC | PRN
  Administered 2014-07-30: 4 mg via INTRAVENOUS

## 2014-07-30 MED ORDER — MIDAZOLAM HCL 2 MG/2ML IJ SOLN
INTRAMUSCULAR | Status: AC
  Filled 2014-07-30: qty 2

## 2014-07-30 MED ORDER — HYDROCODONE-ACETAMINOPHEN 10-325 MG PO TABS
1.0000 | ORAL_TABLET | ORAL | Status: DC | PRN
  Administered 2014-07-30 – 2014-08-01 (×8): 2 via ORAL
  Filled 2014-07-30 (×8): qty 2

## 2014-07-30 MED ORDER — ACETAMINOPHEN 325 MG PO TABS: 650.0000 mg | ORAL_TABLET | Freq: Four times a day (QID) | ORAL | Status: DC | PRN

## 2014-07-30 MED ORDER — RIVAROXABAN 10 MG PO TABS
10.0000 mg | ORAL_TABLET | Freq: Every day | ORAL | Status: DC
  Administered 2014-07-31 – 2014-08-01 (×2): 10 mg via ORAL
  Filled 2014-07-30 (×3): qty 1

## 2014-07-30 MED ORDER — PANTOPRAZOLE SODIUM 40 MG PO TBEC
40.0000 mg | DELAYED_RELEASE_TABLET | Freq: Every day | ORAL | Status: DC
  Administered 2014-07-30 – 2014-08-01 (×3): 40 mg via ORAL
  Filled 2014-07-30 (×3): qty 1

## 2014-07-30 MED ORDER — CALCIUM CARBONATE 600 MG PO TABS
1200.0000 mg | ORAL_TABLET | Freq: Every day | ORAL | Status: DC
  Filled 2014-07-30 (×3): qty 2

## 2014-07-30 MED ORDER — SODIUM CHLORIDE 0.9 % IV SOLN
INTRAVENOUS | Status: DC
  Administered 2014-07-30 – 2014-07-31 (×2): via INTRAVENOUS

## 2014-07-30 MED ORDER — ADULT MULTIVITAMIN W/MINERALS CH
1.0000 | ORAL_TABLET | Freq: Every day | ORAL | Status: DC
  Administered 2014-07-31 – 2014-08-01 (×2): 1 via ORAL
  Filled 2014-07-30 (×2): qty 1

## 2014-07-30 MED ORDER — SUFENTANIL CITRATE 50 MCG/ML IV SOLN
INTRAVENOUS | Status: DC | PRN
  Administered 2014-07-30 (×5): 10 ug via INTRAVENOUS

## 2014-07-30 MED ORDER — SUCCINYLCHOLINE CHLORIDE 20 MG/ML IJ SOLN
INTRAMUSCULAR | Status: DC | PRN
  Administered 2014-07-30: 100 mg via INTRAVENOUS

## 2014-07-30 MED ORDER — HYDROMORPHONE HCL PF 1 MG/ML IJ SOLN
INTRAMUSCULAR | Status: DC | PRN
  Administered 2014-07-30 (×2): 1 mg via INTRAVENOUS

## 2014-07-30 MED ORDER — DOCUSATE SODIUM 100 MG PO CAPS
100.0000 mg | ORAL_CAPSULE | Freq: Two times a day (BID) | ORAL | Status: DC
  Administered 2014-07-30 – 2014-08-01 (×4): 100 mg via ORAL

## 2014-07-30 MED ORDER — MIDAZOLAM HCL 5 MG/5ML IJ SOLN
INTRAMUSCULAR | Status: DC | PRN
  Administered 2014-07-30: 2 mg via INTRAVENOUS

## 2014-07-30 MED ORDER — METOCLOPRAMIDE HCL 5 MG/ML IJ SOLN
5.0000 mg | Freq: Three times a day (TID) | INTRAMUSCULAR | Status: DC | PRN
  Administered 2014-07-30 – 2014-07-31 (×2): 10 mg via INTRAVENOUS
  Filled 2014-07-30 (×2): qty 2

## 2014-07-30 MED ORDER — CLINDAMYCIN PHOSPHATE 600 MG/50ML IV SOLN
600.0000 mg | Freq: Four times a day (QID) | INTRAVENOUS | Status: AC
  Administered 2014-07-30 (×2): 600 mg via INTRAVENOUS
  Filled 2014-07-30 (×2): qty 50

## 2014-07-30 MED ORDER — LIDOCAINE HCL (CARDIAC) 20 MG/ML IV SOLN
INTRAVENOUS | Status: AC
Start: 2014-07-30 — End: 2014-07-30
  Filled 2014-07-30: qty 5

## 2014-07-30 MED ORDER — CLINDAMYCIN PHOSPHATE 900 MG/50ML IV SOLN
INTRAVENOUS | Status: AC
  Filled 2014-07-30: qty 50

## 2014-07-30 MED ORDER — TRANEXAMIC ACID 100 MG/ML IV SOLN
1000.0000 mg | INTRAVENOUS | Status: AC
  Administered 2014-07-30: 1000 mg via INTRAVENOUS
  Filled 2014-07-30: qty 10

## 2014-07-30 MED ORDER — HYDROMORPHONE HCL PF 1 MG/ML IJ SOLN
1.0000 mg | INTRAMUSCULAR | Status: DC | PRN
  Administered 2014-07-30 – 2014-07-31 (×3): 1 mg via INTRAVENOUS
  Filled 2014-07-30 (×3): qty 1

## 2014-07-30 MED ORDER — GLYCOPYRROLATE 0.2 MG/ML IJ SOLN
INTRAMUSCULAR | Status: DC | PRN
  Administered 2014-07-30: 0.6 mg via INTRAVENOUS

## 2014-07-30 MED ORDER — LACTATED RINGERS IV SOLN: INTRAVENOUS | Status: DC

## 2014-07-30 MED ORDER — LACTATED RINGERS IV SOLN
INTRAVENOUS | Status: DC
  Administered 2014-07-30: 1000 mL via INTRAVENOUS

## 2014-07-30 MED ORDER — DEXAMETHASONE SODIUM PHOSPHATE 10 MG/ML IJ SOLN
INTRAMUSCULAR | Status: AC
  Filled 2014-07-30: qty 1

## 2014-07-30 MED ORDER — CISATRACURIUM BESYLATE (PF) 10 MG/5ML IV SOLN
INTRAVENOUS | Status: DC | PRN
  Administered 2014-07-30: 4 mg via INTRAVENOUS
  Administered 2014-07-30: 6 mg via INTRAVENOUS

## 2014-07-30 MED ORDER — ACETAMINOPHEN 650 MG RE SUPP: 650.0000 mg | Freq: Four times a day (QID) | RECTAL | Status: DC | PRN

## 2014-07-30 MED ORDER — DEXTROSE 5 % IV SOLN
500.0000 mg | Freq: Four times a day (QID) | INTRAVENOUS | Status: DC | PRN
  Administered 2014-07-30: 500 mg via INTRAVENOUS
  Filled 2014-07-30: qty 5

## 2014-07-30 MED ORDER — OXYCODONE HCL 5 MG PO TABS: 5.0000 mg | ORAL_TABLET | ORAL | Status: DC | PRN

## 2014-07-30 MED ORDER — POLYETHYLENE GLYCOL 3350 17 G PO PACK
17.0000 g | PACK | Freq: Every day | ORAL | Status: DC | PRN
  Administered 2014-07-31: 17 g via ORAL

## 2014-07-30 MED ORDER — SODIUM CHLORIDE 0.9 % IJ SOLN
INTRAMUSCULAR | Status: AC
  Filled 2014-07-30: qty 10

## 2014-07-30 MED ORDER — LACTATED RINGERS IV SOLN
INTRAVENOUS | Status: DC | PRN
  Administered 2014-07-30 (×2): via INTRAVENOUS

## 2014-07-30 MED ORDER — ONDANSETRON HCL 4 MG/2ML IJ SOLN
4.0000 mg | Freq: Four times a day (QID) | INTRAMUSCULAR | Status: DC | PRN
  Administered 2014-07-31 – 2014-08-01 (×2): 4 mg via INTRAVENOUS
  Filled 2014-07-30 (×2): qty 2

## 2014-07-30 MED ORDER — 0.9 % SODIUM CHLORIDE (POUR BTL) OPTIME
TOPICAL | Status: DC | PRN
  Administered 2014-07-30: 1000 mL

## 2014-07-30 MED ORDER — LUBIPROSTONE 24 MCG PO CAPS
24.0000 ug | ORAL_CAPSULE | Freq: Two times a day (BID) | ORAL | Status: DC
  Administered 2014-07-30 – 2014-08-01 (×4): 24 ug via ORAL
  Filled 2014-07-30 (×6): qty 1

## 2014-07-30 MED ORDER — HYDROMORPHONE HCL PF 1 MG/ML IJ SOLN
INTRAMUSCULAR | Status: AC
  Filled 2014-07-30: qty 1

## 2014-07-30 MED ORDER — LIDOCAINE HCL (CARDIAC) 20 MG/ML IV SOLN
INTRAVENOUS | Status: DC | PRN
  Administered 2014-07-30: 75 mg via INTRAVENOUS
  Administered 2014-07-30: 25 mg via INTRATRACHEAL

## 2014-07-30 MED ORDER — FENTANYL CITRATE 0.05 MG/ML IJ SOLN
INTRAMUSCULAR | Status: AC
  Filled 2014-07-30: qty 5

## 2014-07-30 MED ORDER — MENTHOL 3 MG MT LOZG
1.0000 | LOZENGE | OROMUCOSAL | Status: DC | PRN
Start: 2014-07-30 — End: 2014-08-01
  Filled 2014-07-30: qty 9

## 2014-07-30 MED ORDER — LEVOTHYROXINE SODIUM 50 MCG PO TABS
50.0000 ug | ORAL_TABLET | Freq: Every day | ORAL | Status: DC
  Administered 2014-07-31 – 2014-08-01 (×2): 50 ug via ORAL
  Filled 2014-07-30 (×4): qty 1

## 2014-07-30 MED ORDER — PROPOFOL 10 MG/ML IV BOLUS
INTRAVENOUS | Status: DC | PRN
  Administered 2014-07-30: 200 mg via INTRAVENOUS

## 2014-07-30 MED ORDER — ACETAMINOPHEN 10 MG/ML IV SOLN
1000.0000 mg | Freq: Once | INTRAVENOUS | Status: AC
  Administered 2014-07-30: 1000 mg via INTRAVENOUS
  Filled 2014-07-30: qty 100

## 2014-07-30 MED ORDER — HYDROMORPHONE HCL PF 1 MG/ML IJ SOLN
0.2500 mg | INTRAMUSCULAR | Status: DC | PRN
  Administered 2014-07-30: 0.5 mg via INTRAVENOUS

## 2014-07-30 MED ORDER — METOCLOPRAMIDE HCL 10 MG PO TABS: 5.0000 mg | ORAL_TABLET | Freq: Three times a day (TID) | ORAL | Status: DC | PRN

## 2014-07-30 MED ORDER — METHOCARBAMOL 500 MG PO TABS
500.0000 mg | ORAL_TABLET | Freq: Four times a day (QID) | ORAL | Status: DC | PRN
Start: 2014-07-30 — End: 2014-08-01
  Administered 2014-07-31 – 2014-08-01 (×4): 500 mg via ORAL
  Filled 2014-07-30 (×4): qty 1

## 2014-07-30 MED ORDER — ZOLPIDEM TARTRATE 5 MG PO TABS: 5.0000 mg | ORAL_TABLET | Freq: Every evening | ORAL | Status: DC | PRN

## 2014-07-30 MED ORDER — CLINDAMYCIN PHOSPHATE 900 MG/50ML IV SOLN
900.0000 mg | INTRAVENOUS | Status: AC
  Administered 2014-07-30: 900 mg via INTRAVENOUS

## 2014-07-30 MED ORDER — PHENOL 1.4 % MT LIQD
1.0000 | OROMUCOSAL | Status: DC | PRN
Start: 2014-07-30 — End: 2014-08-01
  Filled 2014-07-30: qty 177

## 2014-07-30 MED ORDER — SODIUM CHLORIDE 0.9 % IR SOLN
Status: DC | PRN
  Administered 2014-07-30: 1000 mL

## 2014-07-30 SURGICAL SUPPLY — 42 items
APL SKNCLS STERI-STRIP NONHPOA (GAUZE/BANDAGES/DRESSINGS)
BAG SPEC THK2 15X12 ZIP CLS (MISCELLANEOUS)
BAG ZIPLOCK 12X15 (MISCELLANEOUS) IMPLANT
BENZOIN TINCTURE PRP APPL 2/3 (GAUZE/BANDAGES/DRESSINGS) IMPLANT
BLADE SAW SGTL 18X1.27X75 (BLADE) ×2 IMPLANT
BLADE SAW SGTL 18X1.27X75MM (BLADE) ×1
CAPT HIP PF COP ×3 IMPLANT
CELLS DAT CNTRL 66122 CELL SVR (MISCELLANEOUS) ×1 IMPLANT
CLOSURE WOUND 1/2 X4 (GAUZE/BANDAGES/DRESSINGS)
COVER PERINEAL POST (MISCELLANEOUS) ×3 IMPLANT
DRAPE C-ARM 42X120 X-RAY (DRAPES) ×3 IMPLANT
DRAPE STERI IOBAN 125X83 (DRAPES) ×3 IMPLANT
DRAPE U-SHAPE 47X51 STRL (DRAPES) ×9 IMPLANT
DRSG AQUACEL AG ADV 3.5X10 (GAUZE/BANDAGES/DRESSINGS) ×3 IMPLANT
DRSG XEROFORM 1X8 (GAUZE/BANDAGES/DRESSINGS) ×3 IMPLANT
DURAPREP 26ML APPLICATOR (WOUND CARE) ×3 IMPLANT
ELECT BLADE TIP CTD 4 INCH (ELECTRODE) ×3 IMPLANT
ELECT REM PT RETURN 9FT ADLT (ELECTROSURGICAL) ×3
ELECTRODE REM PT RTRN 9FT ADLT (ELECTROSURGICAL) ×1 IMPLANT
FACESHIELD WRAPAROUND (MASK) ×12 IMPLANT
GAUZE XEROFORM 1X8 LF (GAUZE/BANDAGES/DRESSINGS) IMPLANT
GLOVE BIO SURGEON STRL SZ7.5 (GLOVE) ×3 IMPLANT
GLOVE BIOGEL PI IND STRL 8 (GLOVE) ×2 IMPLANT
GLOVE BIOGEL PI INDICATOR 8 (GLOVE) ×4
GLOVE ECLIPSE 8.0 STRL XLNG CF (GLOVE) ×3 IMPLANT
GOWN STRL REUS W/TWL XL LVL3 (GOWN DISPOSABLE) ×6 IMPLANT
HANDPIECE INTERPULSE COAX TIP (DISPOSABLE) ×2
KIT BASIN OR (CUSTOM PROCEDURE TRAY) ×3 IMPLANT
PACK TOTAL JOINT (CUSTOM PROCEDURE TRAY) ×3 IMPLANT
RTRCTR WOUND ALEXIS 18CM MED (MISCELLANEOUS) ×3
SET HNDPC FAN SPRY TIP SCT (DISPOSABLE) ×1 IMPLANT
STAPLER VISISTAT 35W (STAPLE) ×3 IMPLANT
STRIP CLOSURE SKIN 1/2X4 (GAUZE/BANDAGES/DRESSINGS) IMPLANT
SUT ETHIBOND NAB CT1 #1 30IN (SUTURE) ×3 IMPLANT
SUT MNCRL AB 4-0 PS2 18 (SUTURE) IMPLANT
SUT VIC AB 0 CT1 36 (SUTURE) ×3 IMPLANT
SUT VIC AB 1 CT1 36 (SUTURE) ×3 IMPLANT
SUT VIC AB 2-0 CT1 27 (SUTURE) ×8
SUT VIC AB 2-0 CT1 TAPERPNT 27 (SUTURE) ×4 IMPLANT
TOWEL OR 17X26 10 PK STRL BLUE (TOWEL DISPOSABLE) ×3 IMPLANT
TOWEL OR NON WOVEN STRL DISP B (DISPOSABLE) ×3 IMPLANT
TRAY FOLEY CATH 14FRSI W/METER (CATHETERS) ×3 IMPLANT

## 2014-07-30 NOTE — Progress Notes (Signed)
Utilization review completed.  

## 2014-07-30 NOTE — Progress Notes (Signed)
Portable AP Pelvis and Lateral Right Hip X-rays done. 

## 2014-07-30 NOTE — H&P (Signed)
TOTAL HIP ADMISSION H&P  Patient is admitted for right total hip arthroplasty.  Subjective:  Chief Complaint: right hip pain  HPI: Kelly Taylor, 63 y.o. female, has a history of pain and functional disability in the right hip(s) due to arthritis and patient has failed non-surgical conservative treatments for greater than 12 weeks to include NSAID's and/or analgesics, corticosteriod injections, use of assistive devices, weight reduction as appropriate and activity modification.  Onset of symptoms was gradual starting 2 years ago with gradually worsening course since that time.The patient noted no past surgery on the right hip(s).  Patient currently rates pain in the right hip at 10 out of 10 with activity. Patient has night pain, worsening of pain with activity and weight bearing, trendelenberg gait, pain that interfers with activities of daily living and pain with passive range of motion. Patient has evidence of subchondral sclerosis, periarticular osteophytes and joint space narrowing by imaging studies. This condition presents safety issues increasing the risk of falls.  There is no current active infection.  Patient Active Problem List   Diagnosis Date Noted  . Arthritis of right hip 07/30/2014  . Postoperative anemia due to acute blood loss 04/08/2013  . OA (osteoarthritis) of knee 04/07/2013   Past Medical History  Diagnosis Date  . Arthritis   . Hx of bladder infections   . History of measles, mumps, or rubella as child    all 3  . History of chicken pox   . Trichomonas   . Yeast in stool   . Menopausal symptoms 2003  . Breast mass, left 2003  . Urge incontinence 2005  . Obesity, morbid 2006  . Hypothyroidism   . Chronic back pain     seeing Pain specialist  . Hemorrhoids   . Polyp of colon     removed during colonoscopy  . Vertigo     over a year was last episode  . Irritable bowel syndrome   . H/O scarlet fever   . GERD (gastroesophageal reflux disease)   . Diabetes  mellitus     borderline  . DDD (degenerative disc disease)   . DJD (degenerative joint disease)   . Anemia     hx of in 20's  . Pericarditis     hx of  . Pneumonia     hx of 5 years ago  . Lumbar stenosis     RFA done May 2015  . Complication of anesthesia     "shallow breathing with general"  . Spinal headache     x1    Past Surgical History  Procedure Laterality Date  . Knee surgery      bilateral knee; x3 right/ x2 left  . Rotator cuff repair  2008    right, almost complete reconstruction, bone anchors  . Breast reduction surgery  1992  . Dilation and curettage of uterus    . Abdominal hysterectomy  02/1990  . Hand surgery Right     right-Dr. Amanda Pea  . Bladder suspension    . Total knee arthroplasty Right 04/07/2013    Procedure: RIGHT TOTAL KNEE ARTHROPLASTY;  Surgeon: Loanne Drilling, MD;  Location: [facility];  Service: Orthopedics;  Laterality: Right;  . Interstim implant placement Right 2003    for incontinence  . Breast lumpectomy Left 2003  . Foot surgery Right     pinched nerve  . Appendectomy  age 42  . Total knee arthroplasty Left 09/22/2013    Procedure: LEFT TOTAL KNEE ARTHROPLASTY;  Surgeon:  Loanne Drilling, MD;  Location: [facility];  Service: Orthopedics;  Laterality: Left;  . Cholecystectomy  2010  . Colonoscopy  may 2015    4 polyps-benign  . Tonsillectomy  age 57  . Hernia repair Right 1992    Prescriptions prior to admission  Medication Sig Dispense Refill  . docusate sodium (COLACE) 100 MG capsule Take 100 mg by mouth daily as needed for constipation.      Marland Kitchen HYDROcodone-acetaminophen (NORCO/VICODIN) 5-325 MG per tablet Take 1-2 tablets by mouth every 6 (six) hours as needed for moderate pain.      Marland Kitchen levothyroxine (SYNTHROID, LEVOTHROID) 50 MCG tablet Take 50 mcg by mouth daily before breakfast.      . lubiprostone (AMITIZA) 24 MCG capsule Take 24 mcg by mouth 2 (two) times daily with a meal.       . polyethylene glycol (MIRALAX / GLYCOLAX) packet Take 17  g by mouth daily as needed for mild constipation (for constipation).       . solifenacin (VESICARE) 10 MG tablet Take 10 mg by mouth daily.       . calcium carbonate (OS-CAL) 600 MG TABS tablet Take 1,200 mg by mouth daily with breakfast.       . fluticasone (FLONASE) 50 MCG/ACT nasal spray Place 2 sprays into the nose daily as needed (for nasal congestion).       . meloxicam (MOBIC) 15 MG tablet Take 15 mg by mouth daily.      . methocarbamol (ROBAXIN) 500 MG tablet Take 500 mg by mouth every 8 (eight) hours as needed for muscle spasms.      . Multiple Vitamins-Minerals (MULTIVITAMIN WITH MINERALS) tablet Take 1 tablet by mouth daily.      . Probiotic Product (PROBIOTIC DAILY PO) Take 1 capsule by mouth daily.      . traMADol (ULTRAM) 50 MG tablet Take 100 mg by mouth every 6 (six) hours as needed for moderate pain.       Allergies  Allergen Reactions  . Penicillins Anaphylaxis and Hives  . Oxycodone Hcl Other (See Comments)    Oxycontin-"turned purple"  . Relafen [Nabumetone] Nausea And Vomiting  . Sulfa Antibiotics Other (See Comments)    fever    History  Substance Use Topics  . Smoking status: Never Smoker   . Smokeless tobacco: Never Used  . Alcohol Use: No    Family History  Problem Relation Age of Onset  . Lung cancer Mother   . Stroke Paternal Grandfather   . Ovarian cancer Maternal Aunt   . Breast cancer Maternal Aunt      Review of Systems  Musculoskeletal: Positive for joint pain.  All other systems reviewed and are negative.   Objective:  Physical Exam  Constitutional: She is oriented to person, place, and time. She appears well-developed and well-nourished.  HENT:  Head: Normocephalic and atraumatic.  Eyes: EOM are normal. Pupils are equal, round, and reactive to light.  Neck: Normal range of motion. Neck supple.  Cardiovascular: Normal rate and regular rhythm.   Respiratory: Effort normal and breath sounds normal.  GI: Soft. Bowel sounds are normal.   Musculoskeletal:       Right hip: She exhibits decreased range of motion, decreased strength and bony tenderness.  Neurological: She is alert and oriented to person, place, and time.  Skin: Skin is warm and dry.  Psychiatric: She has a normal mood and affect.    Vital signs in last 24 hours: Temp:  [97.8  F (36.6 C)] 97.8 F (36.6 C) (09/10 0856) Pulse Rate:  [86] 86 (09/10 0856) Resp:  [18] 18 (09/10 0856) BP: (106)/(66) 106/66 mmHg (09/10 0856) SpO2:  [96 %] 96 % (09/10 0856) Weight:  [124.739 kg (275 lb)] 124.739 kg (275 lb) (09/10 0856)  Labs:   Estimated body mass index is 41.82 kg/(m^2) as calculated from the following:   Height as of this encounter:  (1.727 m).   Weight as of this encounter: 124.739 kg (275 lb).   Imaging Review Plain radiographs demonstrate severe degenerative joint disease of the right hip(s). The bone quality appears to be good for age and reported activity level.  Assessment/Plan:  End stage arthritis, right hip(s)  The patient history, physical examination, clinical judgement of the provider and imaging studies are consistent with end stage degenerative joint disease of the right hip(s) and total hip arthroplasty is deemed medically necessary. The treatment options including medical management, injection therapy, arthroscopy and arthroplasty were discussed at length. The risks and benefits of total hip arthroplasty were presented and reviewed. The risks due to aseptic loosening, infection, stiffness, dislocation/subluxation,  thromboembolic complications and other imponderables were discussed.  The patient acknowledged the explanation, agreed to proceed with the plan and consent was signed. Patient is being admitted for inpatient treatment for surgery, pain control, PT, OT, prophylactic antibiotics, VTE prophylaxis, progressive ambulation and ADL's and discharge planning.The patient is planning to be discharged home with home health services

## 2014-07-30 NOTE — Addendum Note (Signed)
Addendum created 07/30/14 1407 by Illene Silver, CRNA   Modules edited: Anesthesia Medication Administration

## 2014-07-30 NOTE — Progress Notes (Signed)
X-ray results noted 

## 2014-07-30 NOTE — Op Note (Signed)
NAMEMarland Kitchen  Kelly Taylor, Kelly Taylor                 ACCOUNT NO.:  [id number]  MEDICAL RECORD NO.:  [id number]  LOCATION:  [facility]                         FACILITY:  [facility]  PHYSICIAN:  Vanita Panda. Magnus Ivan, M.D.DATE OF BIRTH:  04-13-1951  DATE OF PROCEDURE:  07/30/2014 DATE OF DISCHARGE:                              OPERATIVE REPORT   PREOPERATIVE DIAGNOSIS:  Severe osteoarthritis and degenerative joint disease, right hip.  POSTOPERATIVE DIAGNOSIS:  Severe osteoarthritis and degenerative joint disease, right hip.  PROCEDURE:  Right total hip arthroplasty through direct anterior approach.  IMPLANTS:  DePuy Sector Gription acetabular component size 50 with apex hole eliminator guide and single screw, size 32+ 4 neutral polyethylene liner, size 11 Corail femoral component with varus offset (KLA), size 32+ 1 ceramic hip ball.  SURGEON:  Doneen Poisson, MD.  ASSISTANT:  Richardean Canal, PA-C.  ANESTHESIA:  General.  ANTIBIOTICS:  IV clindamycin 900 mg.  BLOOD LOSS:  300-500 mL.  COMPLICATIONS:  None.  INDICATIONS:  Kelly Taylor is a 63 year old female with debilitating arthritis throughout most of the joints of her body.  She has had successful bilateral knee replacements in the past by another surgeon. Her right hip pain has become debilitating for her.  X-rays show severe arthritis in this hip.  It has gotten to where it affects her mobility, her activities of daily living.  Her pain is severe.  She has tried intra-articular steroid injections, weight loss, walking with assistive device, and activity modification.  These things did not help.  Given the detrimental side now on her quality of life, she wished to proceed with a total hip arthroplasty.  She understands the risks of acute blood loss anemia, nerve and vessel injury, fracture, infection, dislocation, DVT.  She understands the goals are decreased pain, improved mobility, and overall improved quality of life.  PROCEDURE IN  DETAIL:  After informed consent was obtained, appropriate right hip was marked.  She was brought to the operating room and general anesthesia was obtained while she was on the stretcher.  A Foley catheter was then placed and traction boots were placed on both of her feet.  She was next placed supine on the hana fracture table with the perineal post in place and both legs in inline skeletal traction devices, but no traction applied.  Her right operative hip was then prepped and draped with DuraPrep and sterile drapes.  A time-out was called to identify correct patient, correct right hip.  We then made an incision inferior and posterior to the anterior superior iliac spine and carried this obliquely down the leg.  We dissected down the tensor fascia lata muscle and the tensor fascia was then divided longitudinally, so we could proceed with a direct anterior approach to the hip.  We placed a Cobra retractor around the lateral neck and up underneath the rectus femoris, and a Cobra retractor medially.  We cauterized the lateral femoral circumflex vessels and then opened up the hip capsule in a L-type format finding the femoral head devoid of cartilage and a large joint effusion.  We then made our femoral neck cut with an oscillating saw just proximal to the lesser trochanter and completed this with  an osteotome.  I placed a corkscrew guide in the femoral head and removed the femoral head in its entirety.  I then cleaned the acetabulum, remnants of acetabular labrum and debris.  We placed a bent Hohmann medially and a Hohmann retractor laterally.  I then began reaming in 2 mm increments from a size 42 all the way up to a size 50 with all reamers under direct visualization and last reamer under direct fluoroscopy, so we could also obtain our depth of reaming, our inclination, and anteversion.  Once I was pleased with this, I placed the real DePuy Sector Gription acetabular component size 50,  apex hole eliminator guide and a single screw.  I then placed the real 32+ 4 neutral polyethylene liner.  Attention was then turned to the femur. With the leg externally rotated to 100 degrees extended and adducted, I was able to place a Mueller retractor medially and a Hohmann retractor behind the greater trochanter.  I released the lateral joint capsule and used a box cutting osteotome to enter the femoral canal and a rongeur to lateralize.  I then began broaching from a size 8 broach using the Corail broaching system up to a size 11.  With a size 11, based on her offset, we trialed a varus offset femoral neck and a 32+ hip ball.  We brought the leg back over and up with traction and internal rotation and reducing the pelvis.  I was pleased with the stability.  Her offset and leg lengths were measured under direct fluoroscopy as well as direct visualization.  I then dislocated the hip and removed the trial components.  We placed the real Corail femoral component with varus offset and the real 32+ 1 ceramic hip ball, reduced this back in the acetabulum; again, it was stable.  We then copiously irrigated the soft tissue with normal saline solution using pulsatile lavage.  We closed the joint capsule with interrupted #1 Ethibond suture followed by a running #1 Vicryl in the tensor fascia, 0 Vicryl in the deep tissue, 2-0 Vicryl in subcutaneous tissue, staples to closing the skin.  An Aquacel dressing was applied.  She was then taken off the hana table, awakened, extubated, and taken to recovery room in stable condition.  All final counts were correct.  There were no complications noted.     ChriVanita Panda Magnus Ivan, M.D.     CYB/MEDQ  D:  07/30/2014  T:  07/30/2014  Job:  [id number]

## 2014-07-30 NOTE — Anesthesia Preprocedure Evaluation (Addendum)
Anesthesia Evaluation  Patient identified by MRN, date of birth, ID band Patient awake    Reviewed: Allergy & Precautions, H&P , NPO status , Patient's Chart, lab work & pertinent test results  History of Anesthesia Complications (+) POST - OP SPINAL HEADACHE  Airway Mallampati: II TM Distance: >3 FB Neck ROM: full    Dental no notable dental hx. (+) Teeth Intact, Dental Advisory Given   Pulmonary neg pulmonary ROS,  breath sounds clear to auscultation  Pulmonary exam normal       Cardiovascular Exercise Tolerance: Good negative cardio ROS  Rhythm:regular Rate:Normal  History pericarditis   Neuro/Psych  Headaches, Lumbar stenosis.  Vertigo - last episode a year ago negative neurological ROS  negative psych ROS   GI/Hepatic negative GI ROS, Neg liver ROS,   Endo/Other  diabetesHypothyroidism Morbid obesity  Renal/GU negative Renal ROS  negative genitourinary   Musculoskeletal   Abdominal (+) + obese,   Peds  Hematology negative hematology ROS (+)   Anesthesia Other Findings   Reproductive/Obstetrics negative OB ROS                          Anesthesia Physical Anesthesia Plan  ASA: III  Anesthesia Plan: General   Post-op Pain Management:    Induction: Intravenous  Airway Management Planned: Oral ETT  Additional Equipment:   Intra-op Plan:   Post-operative Plan: Extubation in OR  Informed Consent: I have reviewed the patients History and Physical, chart, labs and discussed the procedure including the risks, benefits and alternatives for the proposed anesthesia with the patient or authorized representative who has indicated his/her understanding and acceptance.   Dental Advisory Given  Plan Discussed with: CRNA and Surgeon  Anesthesia Plan Comments:         Anesthesia Quick Evaluation

## 2014-07-30 NOTE — Anesthesia Postprocedure Evaluation (Signed)
  Anesthesia Post-op Note  Patient: Kelly Taylor  Procedure(s) Performed: Procedure(s) (LRB): RIGHT TOTAL HIP ARTHROPLASTY ANTERIOR APPROACH (Right)  Patient Location: PACU  Anesthesia Type: General  Level of Consciousness: awake and alert   Airway and Oxygen Therapy: Patient Spontanous Breathing  Post-op Pain: mild  Post-op Assessment: Post-op Vital signs reviewed, Patient's Cardiovascular Status Stable, Respiratory Function Stable, Patent Airway and No signs of Nausea or vomiting  Last Vitals:  Filed Vitals:   07/30/14 1247  BP: 104/64  Pulse: 65  Temp:   Resp: 14    Post-op Vital Signs: stable   Complications: No apparent anesthesia complications

## 2014-07-30 NOTE — Brief Op Note (Signed)
07/30/2014  11:49 AM  PATIENT:  Kelly Taylor  63 y.o. female  PRE-OPERATIVE DIAGNOSIS:  Osteoarthritis right hip  POST-OPERATIVE DIAGNOSIS:  Osteoarthritis right hip  PROCEDURE:  Procedure(s): RIGHT TOTAL HIP ARTHROPLASTY ANTERIOR APPROACH (Right)  SURGEON:  Surgeon(s) and Role:    * Kathryne Hitch, MD - Primary  PHYSICIAN ASSISTANT: Rexene Edison, PA-C  ANESTHESIA:   general  EBL:  Total I/O In: 700 [I.V.:700] Out: -   BLOOD ADMINISTERED:none  DRAINS: none   LOCAL MEDICATIONS USED:  NONE  SPECIMEN:  No Specimen  COUNTS:  YES  TOURNIQUET:  * No tourniquets in log *  DICTATION: .Other Dictation: Dictation Number [id number]  PLAN OF CARE: Admit to inpatient   PATIENT DISPOSITION:  PACU - hemodynamically stable.   Delay start of Pharmacological VTE agent (>24hrs) due to surgical blood loss or risk of bleeding: no

## 2014-07-30 NOTE — Transfer of Care (Signed)
Immediate Anesthesia Transfer of Care Note  Patient: Kelly Taylor  Procedure(s) Performed: Procedure(s): RIGHT TOTAL HIP ARTHROPLASTY ANTERIOR APPROACH (Right)  Patient Location: PACU  Anesthesia Type:General  Level of Consciousness: awake, alert , oriented and patient cooperative  Airway & Oxygen Therapy: Patient Spontanous Breathing and Patient connected to face mask oxygen  Post-op Assessment: Report given to PACU RN, Post -op Vital signs reviewed and stable and Patient moving all extremities X 4  Post vital signs: stable  Complications: No apparent anesthesia complications

## 2014-07-31 LAB — CBC
HCT: 33.7 % — ABNORMAL LOW (ref 36.0–46.0)
Hemoglobin: 10.7 g/dL — ABNORMAL LOW (ref 12.0–15.0)
MCH: 28 pg (ref 26.0–34.0)
MCHC: 31.8 g/dL (ref 30.0–36.0)
MCV: 88.2 fL (ref 78.0–100.0)
Platelets: 179 10*3/uL (ref 150–400)
RBC: 3.82 MIL/uL — AB (ref 3.87–5.11)
RDW: 14.3 % (ref 11.5–15.5)
WBC: 8.7 10*3/uL (ref 4.0–10.5)

## 2014-07-31 LAB — BASIC METABOLIC PANEL
ANION GAP: 11 (ref 5–15)
BUN: 12 mg/dL (ref 6–23)
CALCIUM: 8.5 mg/dL (ref 8.4–10.5)
CHLORIDE: 101 meq/L (ref 96–112)
CO2: 26 mEq/L (ref 19–32)
Creatinine, Ser: 0.71 mg/dL (ref 0.50–1.10)
GFR calc Af Amer: >90 mL/min (ref >90)
GFR calc non Af Amer: >90 mL/min (ref >90)
Glucose, Bld: 131 mg/dL — ABNORMAL HIGH (ref 70–99)
Potassium: 3.8 mEq/L (ref 3.7–5.3)
SODIUM: 138 meq/L (ref 137–147)

## 2014-07-31 MED ORDER — CALCIUM CARBONATE 1250 (500 CA) MG PO TABS
1000.0000 mg | ORAL_TABLET | Freq: Every day | ORAL | Status: DC
  Administered 2014-07-31 – 2014-08-01 (×2): 1000 mg via ORAL
  Filled 2014-07-31 (×3): qty 2

## 2014-07-31 NOTE — Progress Notes (Signed)
Subjective: 1 Day Post-Op Procedure(s) (LRB): RIGHT TOTAL HIP ARTHROPLASTY ANTERIOR APPROACH (Right) Patient reports pain as moderate.  Already working well with therapy.  Does report left shoulder pain post op and pain where she had IV placed.  Asymptomatic acute blood loss anemia.  Objective: Vital signs in last 24 hours: Temp:  [98 F (36.7 C)-99.6 F (37.6 C)] 98.5 F (36.9 C) (09/11 0900) Pulse Rate:  [58-84] 75 (09/11 0900) Resp:  [8-19] 18 (09/11 0900) BP: (95-127)/(50-68) 104/57 mmHg (09/11 0900) SpO2:  [93 %-100 %] 94 % (09/11 0900) Weight:  [124.739 kg (275 lb)] 124.739 kg (275 lb) (09/10 1434)  Intake/Output from previous day: 09/10 0701 - 09/11 0700 In: 3386.6 [P.O.:560; I.V.:2721.6; IV Piggyback:105] Out: 1775 [Urine:1425; Blood:350] Intake/Output this shift: Total I/O In: 470 [P.O.:240; I.V.:230] Out: 150 [Urine:150]   Recent Labs  07/31/14 0433  HGB 10.7*    Recent Labs  07/31/14 0433  WBC 8.7  RBC 3.82*  HCT 33.7*  PLT 179    Recent Labs  07/31/14 0433  NA 138  K 3.8  CL 101  CO2 26  BUN 12  CREATININE 0.71  GLUCOSE 131*  CALCIUM 8.5   No results found for this basename: LABPT, INR,  in the last 72 hours  Sensation intact distally Intact pulses distally Dorsiflexion/Plantar flexion intact Incision: dressing C/D/I  Assessment/Plan: 1 Day Post-Op Procedure(s) (LRB): RIGHT TOTAL HIP ARTHROPLASTY ANTERIOR APPROACH (Right) Up with therapy Plan for discharge tomorrow  Kathryne Hitch 07/31/2014, 12:06 PM

## 2014-07-31 NOTE — Progress Notes (Signed)
Physical Therapy Treatment Patient Details Name: Kelly Taylor MRN: [id number] DOB: 1951/02/19 Today's Date: 26-Aug-2014    History of Present Illness R THA ant approach    PT Comments    Pt very motivated and progressing to recip gait this pm  Follow Up Recommendations  Home health PT     Equipment Recommendations  None recommended by PT    Recommendations for Other Services OT consult     Precautions / Restrictions Precautions Precautions: Anterior Hip Restrictions Weight Bearing Restrictions: No RLE Weight Bearing: Weight bearing as tolerated    Mobility  Bed Mobility Overal bed mobility: Needs Assistance Bed Mobility: Sit to Supine       Sit to supine: Min assist   General bed mobility comments: cues for sequence and min assist for LEs with pt self assisting R LE with L LE  Transfers Overall transfer level: Needs assistance Equipment used: Rolling walker (2 wheeled) Transfers: Sit to/from Stand Sit to Stand: Supervision         General transfer comment: cues for LE management and use of UEs to self assist  Ambulation/Gait Ambulation/Gait assistance: Min assist;Min guard Ambulation Distance (Feet): 175 Feet (and 20' into bathroom) Assistive device: Rolling walker (2 wheeled) Gait Pattern/deviations: Step-to pattern;Step-through pattern;Decreased step length - right;Decreased step length - left;Shuffle;Trunk flexed     General Gait Details: cues for posture, stride length, position from RW and initial sequence   Stairs            Wheelchair Mobility    Modified Rankin (Stroke Patients Only)       Balance                                    Cognition Arousal/Alertness: Awake/alert Behavior During Therapy: WFL for tasks assessed/performed Overall Cognitive Status: Within Functional Limits for tasks assessed                      Exercises      General Comments        Pertinent Vitals/Pain Pain Assessment:  0-10 Pain Score: 4  Pain Location: R hip Pain Descriptors / Indicators: Aching Pain Intervention(s): Limited activity within patient's tolerance;Monitored during session;Premedicated before session;Ice applied    Home Living                      Prior Function            PT Goals (current goals can now be found in the care plan section) Acute Rehab PT Goals Patient Stated Goal: to go home PT Goal Formulation: With patient Time For Goal Achievement: 08/07/14 Potential to Achieve Goals: Good Progress towards PT goals: Progressing toward goals    Frequency  7X/week    PT Plan Current plan remains appropriate    Co-evaluation             End of Session Equipment Utilized During Treatment: Gait belt Activity Tolerance: Patient tolerated treatment well Patient left: in bed;with call bell/phone within reach;with family/visitor present     Time: 4098-1191 PT Time Calculation (min): 30 min  Charges:  $Gait Training: 8-22 mins $Therapeutic Activity: 8-22 mins                    G Codes:      Emslee Lopezmartinez Aug 26, 2014, 4:05 PM

## 2014-07-31 NOTE — Evaluation (Signed)
Physical Therapy Evaluation Patient Details Name: Kelly Taylor MRN: [id number] DOB: 08/14/1951 Today's Date: 07/31/2014   History of Present Illness  R THA ant approach  Clinical Impression  Pt s/p R THR presents with decreased R LE strength/ROM and post op pain limiting functional mobility.  Pt should progress well to d/c home with family assist and HHPT follow up.    Follow Up Recommendations Home health PT    Equipment Recommendations  None recommended by PT    Recommendations for Other Services OT consult     Precautions / Restrictions Precautions Precautions: Anterior Hip Restrictions Weight Bearing Restrictions: No RLE Weight Bearing: Weight bearing as tolerated      Mobility  Bed Mobility Overal bed mobility: Needs Assistance Bed Mobility: Supine to Sit     Supine to sit: Min assist;HOB elevated     General bed mobility comments: Pt up in chair. Pt states PT assisted with moving RLE to EOB. Pt states that her husband will be able to assist with this.  Transfers Overall transfer level: Needs assistance Equipment used: Rolling walker (2 wheeled) Transfers: Sit to/from [employer] Sit to Stand: Min assist         General transfer comment: cues for LE managment and use of UEs to self assist  Ambulation/Gait Ambulation/Gait assistance: Min assist Ambulation Distance (Feet): 73 Feet Assistive device: Rolling walker (2 wheeled) Gait Pattern/deviations: Step-to pattern;Decreased step length - right;Decreased step length - left;Shuffle;Trunk flexed Gait velocity: decr   General Gait Details: cues for posture, stride length, position from RW and initial sequence  Stairs            Wheelchair Mobility    Modified Rankin (Stroke Patients Only)       Balance Overall balance assessment: Needs assistance         Standing balance support: During functional activity Standing balance-Leahy Scale: Fair                                Pertinent Vitals/Pain Pain Assessment: 0-10 Pain Score: 2  Pain Location: R hip Pain Descriptors / Indicators: Aching Pain Intervention(s): Limited activity within patient's tolerance    Home Living Family/patient expects to be discharged to:: Private residence Living Arrangements: Spouse/significant other Available Help at Discharge: Family;Available 24 hours/day Type of Home: House Home Access: Stairs to enter Entrance Stairs-Rails: None Entrance Stairs-Number of Steps: 3 Home Layout: One level Home Equipment: Walker - 2 wheels;Cane - single point;Bedside commode Additional Comments: can borrow tub bench from friend    Prior Function Level of Independence: Independent         Comments: HH CNA     Hand Dominance        Extremity/Trunk Assessment   Upper Extremity Assessment: Overall WFL for tasks assessed           Lower Extremity Assessment: Defer to PT evaluation RLE Deficits / Details: 2+/5 hip strength with AAROM at hip to 75 flex and 20 abd    Cervical / Trunk Assessment: Normal  Communication   Communication: No difficulties  Cognition Arousal/Alertness: Awake/alert Behavior During Therapy: WFL for tasks assessed/performed Overall Cognitive Status: Within Functional Limits for tasks assessed                      General Comments General comments (skin integrity, edema, etc.): c/oL upper arm numbness?    Exercises Total Joint Exercises Ankle Circles/Pumps: AROM;Both;15  reps;Supine Quad Sets: AROM;Both;10 reps;Supine Heel Slides: AAROM;Right;15 reps;Supine Hip ABduction/ADduction: AAROM;Right;10 reps;Supine      Assessment/Plan    PT Assessment Patient needs continued PT services  PT Diagnosis Difficulty walking   PT Problem List Decreased strength;Decreased range of motion;Decreased activity tolerance;Decreased mobility;Decreased knowledge of use of DME;Obesity;Pain  PT Treatment Interventions DME instruction;Gait  training;Stair training;Functional mobility training;Therapeutic activities;Therapeutic exercise;Patient/family education   PT Goals (Current goals can be found in the Care Plan section) Acute Rehab PT Goals Patient Stated Goal: to go home PT Goal Formulation: With patient Time For Goal Achievement: 08/07/14 Potential to Achieve Goals: Good    Frequency 7X/week   Barriers to discharge        Co-evaluation               End of Session Equipment Utilized During Treatment: Gait belt Activity Tolerance: Patient tolerated treatment well Patient left: in chair;with call bell/phone within reach;with family/visitor present Nurse Communication: Mobility status         Time: 1030-1104 PT Time Calculation (min): 34 min   Charges:   PT Evaluation $Initial PT Evaluation Tier I: 1 Procedure PT Treatments $Gait Training: 8-22 mins $Therapeutic Exercise: 8-22 mins   PT G Codes:          Kelly Taylor 07/31/2014, 12:34 PM

## 2014-07-31 NOTE — Progress Notes (Signed)
In leadership rounding this morning, patient c/o pain, tingling in left posterior arm.  Increase pain with elevating arm.  IV site was done in left wrist during procedure, patient concern. Encouraged ROM exercise as tolerated. Will make MD/PA aware during their rounds.

## 2014-07-31 NOTE — Progress Notes (Signed)
Occupational Therapy Evaluation Patient Details Name: [name] MRN: [id number] DOB: Nov 05, 1951 Today's Date: 07/31/2014    History of Present Illness R THA ant approach   Clinical Impression   PTA, pt mod i with mobility and ADL. Completed all education for ADL and functional mobility at RW level. Pt verbalized understanding. Pt will have 24/7 assist at home and has all nec DME. Pt ready for D/C home when medically stable. O2 Sats 94 RA after session.     Follow Up Recommendations  No OT follow up;Supervision - Intermittent    Equipment Recommendations  Tub/shower bench (pt will borrow)    Recommendations for Other Services       Precautions / Restrictions Precautions Precautions: Anterior Hip Restrictions Weight Bearing Restrictions: No RLE Weight Bearing: Weight bearing as tolerated      Mobility Bed Mobility   General bed mobility comments: Pt up in chair. Pt states PT assisted with moving RLE to EOB. Pt states that her husband will be able to assist with this.  Transfers Overall transfer level: Needs assistance Equipment used: Rolling walker (2 wheeled) Transfers: Sit to/from [employer] Sit to Stand: Supervision Stand pivot transfers: Supervision       General transfer comment: increased time. encouraged safe placement of RW during funcitonal mobility    Balance Overall balance assessment: Needs assistance         Standing balance support: During functional activity Standing balance-Leahy Scale: Fair                              ADL Overall ADL's : Needs assistance/impaired                         Toilet Transfer: Supervision/safety;Ambulation;BSC (over toilet) Toilet Transfer Details (indicate cue type and reason): Rec for pt to stay inside of RW while ambulating in bathroom.  Toileting- Clothing Manipulation and Hygiene: Modified independent;Sit to/from Nurse, children's Details (indicate  cue type and reason):  (Demonstrated options for tub transer) Functional mobility during ADLs: Supervision/safety;Rolling walker;Cueing for safety General ADL Comments: Pt has AE and DME from previous knee surgeries. will have husband help as needed. discussed options foruse of 3 in 1 in tub or best option to borrow tub bench from friend and use until she is strong enough to step over tub. Rec installation of grab bars in tub.     Vision                     Perception     Praxis      Pertinent Vitals/Pain Pain Assessment: 0-10 Pain Score: 2  Pain Location: R hip Pain Descriptors / Indicators: Aching Pain Intervention(s): Limited activity within patient's tolerance     Hand Dominance     Extremity/Trunk Assessment Upper Extremity Assessment Upper Extremity Assessment: Overall WFL for tasks assessed   Lower Extremity Assessment Lower Extremity Assessment: Defer to PT evaluation    Cervical / Trunk Assessment Cervical / Trunk Assessment: Normal   Communication Communication Communication: No difficulties   Cognition Arousal/Alertness: Awake/alert Behavior During Therapy: WFL for tasks assessed/performed Overall Cognitive Status: Within Functional Limits for tasks assessed                     General Comments       Exercises Exercises: Total Joint     Shoulder Instructions  Home Living Family/patient expects to be discharged to:: Private residence Living Arrangements: Spouse/significant other Available Help at Discharge: Family;Available 24 hours/day Type of Home: House Home Access: Stairs to enter [employer] of Steps: 3 Entrance Stairs-Rails: None Home Layout: One level     Bathroom Shower/Tub: Tub/shower unit Shower/tub characteristics: Engineer, building services: Standard Bathroom Accessibility: No How Accessible: Other (comment) (only accessible with cane) Home Equipment: Walker - 2 wheels;Cane - single point;Bedside  commode   Additional Comments: can borrow tub bench from friend      Prior Functioning/Environment Level of Independence: Independent        Comments: HH CNA    OT Diagnosis:     OT Problem List:     OT Treatment/Interventions:      OT Goals(Current goals can be found in the care plan section) Acute Rehab OT Goals Patient Stated Goal: to go home OT Goal Formulation:  (eval only)  OT Frequency:     Barriers to D/C:            Co-evaluation              End of Session Equipment Utilized During Treatment: Gait belt;Rolling walker Nurse Communication: Mobility status  Activity Tolerance: Patient tolerated treatment well Patient left: in chair;with call bell/phone within reach;with family/visitor present   Time: 1105-1140 OT Time Calculation (min): 35 min Charges:  OT General Charges $OT Visit: 1 Procedure OT Evaluation $Initial OT Evaluation Tier I: 1 Procedure OT Treatments $Self Care/Home Management : 23-37 mins G-Codes:    Kelly Taylor,Kelly Taylor Aug 04, 2014, 12:35 PM   [facility], OTR/L  [phone] 08-04-2014

## 2014-08-01 LAB — CBC
HCT: 31.5 % — ABNORMAL LOW (ref 36.0–46.0)
Hemoglobin: 10.1 g/dL — ABNORMAL LOW (ref 12.0–15.0)
MCH: 28.3 pg (ref 26.0–34.0)
MCHC: 32.1 g/dL (ref 30.0–36.0)
MCV: 88.2 fL (ref 78.0–100.0)
Platelets: 162 10*3/uL (ref 150–400)
RBC: 3.57 MIL/uL — ABNORMAL LOW (ref 3.87–5.11)
RDW: 14.2 % (ref 11.5–15.5)
WBC: 8.3 10*3/uL (ref 4.0–10.5)

## 2014-08-01 MED ORDER — ASPIRIN EC 325 MG PO TBEC: 325.0000 mg | DELAYED_RELEASE_TABLET | Freq: Two times a day (BID) | ORAL | Status: DC

## 2014-08-01 MED ORDER — HYDROCODONE-ACETAMINOPHEN 10-325 MG PO TABS: 1.0000 | ORAL_TABLET | ORAL | Status: DC | PRN

## 2014-08-01 NOTE — Progress Notes (Signed)
Discharged from floor via w/c, family with pt. No changes in assessment. Kennett Symes  

## 2014-08-01 NOTE — Progress Notes (Signed)
Pt up in hall and doing stairs with PT. No problems. Feels ready to go. Will discharge. Billyjoe Go, [employer]

## 2014-08-01 NOTE — Progress Notes (Signed)
Pt became dizzy & nauseated while up with PT. VSS, H&H okay. PA Kriste Basque notified, said pt could still go home later today if she is better, otherwise may stay tonight. Stephenie Navejas, [employer]

## 2014-08-01 NOTE — Discharge Summary (Signed)
Patient ID: Kelly Taylor MRN: [id number] DOB/AGE: November 03, 1951 63 y.o.  Admit date: 07/30/2014 Discharge date: 08/01/2014  Admission Diagnoses:  Principal Problem:   Arthritis of right hip Active Problems:   Status post total replacement of right hip   Discharge Diagnoses:  Same  Past Medical History  Diagnosis Date  . Arthritis   . Hx of bladder infections   . History of measles, mumps, or rubella as child    all 3  . History of chicken pox   . Trichomonas   . Yeast in stool   . Menopausal symptoms 2003  . Breast mass, left 2003  . Urge incontinence 2005  . Obesity, morbid 2006  . Hypothyroidism   . Chronic back pain     seeing Pain specialist  . Hemorrhoids   . Polyp of colon     removed during colonoscopy  . Vertigo     over a year was last episode  . Irritable bowel syndrome   . H/O scarlet fever   . GERD (gastroesophageal reflux disease)   . Diabetes mellitus     borderline  . DDD (degenerative disc disease)   . DJD (degenerative joint disease)   . Anemia     hx of in 20's  . Pericarditis     hx of  . Pneumonia     hx of 5 years ago  . Lumbar stenosis     RFA done May 2015  . Complication of anesthesia     "shallow breathing with general"  . Spinal headache     x1    Surgeries: Procedure(s): RIGHT TOTAL HIP ARTHROPLASTY ANTERIOR APPROACH on 07/30/2014   Consultants:    Discharged Condition: Improved  Hospital Course: Kelly Taylor is an 63 y.o. female who was admitted 07/30/2014 for operative treatment ofArthritis of right hip. Patient has severe unremitting pain that affects sleep, daily activities, and work/hobbies. After pre-op clearance the patient was taken to the operating room on 07/30/2014 and underwent  Procedure(s): RIGHT TOTAL HIP ARTHROPLASTY ANTERIOR APPROACH.    Patient was given perioperative antibiotics: Anti-infectives   Start     Dose/Rate Route Frequency Ordered Stop   07/30/14 1600  clindamycin (CLEOCIN) IVPB 600 mg     600  mg 100 mL/hr over 30 Minutes Intravenous Every 6 hours 07/30/14 1442 07/30/14 2324   07/30/14 0835  clindamycin (CLEOCIN) IVPB 900 mg     900 mg 100 mL/hr over 30 Minutes Intravenous On call to O.R. 07/30/14 0835 07/30/14 1021       Patient was given sequential compression devices, early ambulation, and chemoprophylaxis to prevent DVT.  Patient benefited maximally from hospital stay and there were no complications.    Recent vital signs: Patient Vitals for the past 24 hrs:  BP Temp Temp src Pulse Resp SpO2  08/01/14 0446 - - - - - 90 %  08/01/14 0445 115/54 mmHg 99.6 F (37.6 C) Oral 80 16 88 %  07/31/14 2106 106/55 mmHg 98.5 F (36.9 C) Oral 96 14 90 %  07/31/14 1655 106/52 mmHg 99.5 F (37.5 C) Oral 80 16 93 %  07/31/14 1600 - - - - 16 94 %  07/31/14 1323 99/57 mmHg 98.6 F (37 C) Oral 74 18 94 %  07/31/14 1200 - - - - 16 94 %  07/31/14 0900 104/57 mmHg 98.5 F (36.9 C) Oral 75 18 94 %     Recent laboratory studies:  Recent Labs  07/31/14 0433 08/01/14 0433  WBC  8.7 8.3  HGB 10.7* 10.1*  HCT 33.7* 31.5*  PLT 179 162  NA 138  --   K 3.8  --   CL 101  --   CO2 26  --   BUN 12  --   CREATININE 0.71  --   GLUCOSE 131*  --   CALCIUM 8.5  --      Discharge Medications:     Medication List    STOP taking these medications       HYDROcodone-acetaminophen 5-325 MG per tablet  Commonly known as:  NORCO/VICODIN  Replaced by:  HYDROcodone-acetaminophen 10-325 MG per tablet     meloxicam 15 MG tablet  Commonly known as:  MOBIC      TAKE these medications       AMITIZA 24 MCG capsule  Generic drug:  lubiprostone  Take 24 mcg by mouth 2 (two) times daily with a meal.     aspirin EC 325 MG tablet  Take 1 tablet (325 mg total) by mouth 2 (two) times daily after a meal.     calcium carbonate 600 MG Tabs tablet  Commonly known as:  OS-CAL  Take 1,200 mg by mouth daily with breakfast.     docusate sodium 100 MG capsule  Commonly known as:  COLACE  Take 100  mg by mouth daily as needed for constipation.     fluticasone 50 MCG/ACT nasal spray  Commonly known as:  FLONASE  Place 2 sprays into the nose daily as needed (for nasal congestion).     HYDROcodone-acetaminophen 10-325 MG per tablet  Commonly known as:  NORCO  Take 1-2 tablets by mouth every 4 (four) hours as needed for severe pain.     levothyroxine 50 MCG tablet  Commonly known as:  SYNTHROID, LEVOTHROID  Take 50 mcg by mouth daily before breakfast.     methocarbamol 500 MG tablet  Commonly known as:  ROBAXIN  Take 500 mg by mouth every 8 (eight) hours as needed for muscle spasms.     multivitamin with minerals tablet  Take 1 tablet by mouth daily.     polyethylene glycol packet  Commonly known as:  MIRALAX / GLYCOLAX  Take 17 g by mouth daily as needed for mild constipation (for constipation).     PROBIOTIC DAILY PO  Take 1 capsule by mouth daily.     solifenacin 10 MG tablet  Commonly known as:  VESICARE  Take 10 mg by mouth daily.     traMADol 50 MG tablet  Commonly known as:  ULTRAM  Take 100 mg by mouth every 6 (six) hours as needed for moderate pain.        Diagnostic Studies: Dg Hip Complete Right  07/30/2014   CLINICAL DATA:  Right hip replacement  EXAM: RIGHT HIP - COMPLETE 2+ VIEW  COMPARISON:  None.  FINDINGS: C-arm spot films show the femoral and acetabular components of the right hip replacement to be in good position. No complicating features are seen.  IMPRESSION: Right total hip replacement components in good position.   Electronically Signed   By: Dwyane Dee M.D.   On: 07/30/2014 11:58   Dg Pelvis Portable  07/30/2014   CLINICAL DATA:  Status post right hip replacement.  EXAM: PORTABLE PELVIS 1-2 VIEWS  COMPARISON:  None.  FINDINGS: Right hip replacement is in place. The device is located and no fracture is identified. Spinal stimulator is noted  IMPRESSION: Status post right hip replacement without evidence complication.  Electronically Signed   By:  Drusilla Kanner M.D.   On: 07/30/2014 13:14   Dg Hip Portable 1 View Right  07/30/2014   CLINICAL DATA:  Arthritis of the right hip. Status post total hip prosthesis insertion.  EXAM: PORTABLE RIGHT HIP - 1 VIEW  COMPARISON:  None.  FINDINGS: Lateral view of the right hip demonstrates that the acetabular and femoral components appear in excellent position in the lateral projection. No fracture.  IMPRESSION: Satisfactory appearance of the right hip in the lateral projection after total hip prosthesis insertion.   Electronically Signed   By: Geanie Cooley M.D.   On: 07/30/2014 13:14   Dg C-arm 61-120 Min-no Report  07/30/2014   CLINICAL DATA: anterior hip right   C-ARM 61-120 MINUTES  Fluoroscopy was utilized by the requesting physician.  No radiographic  interpretation.     Disposition: 01-Home or Self Care      Discharge Instructions   Call MD / Call 911    Complete by:  As directed   If you experience chest pain or shortness of breath, CALL 911 and be transported to the hospital emergency room.  If you develope a fever above 101 F, pus (white drainage) or increased drainage or redness at the wound, or calf pain, call your surgeon's office.     Constipation Prevention    Complete by:  As directed   Drink plenty of fluids.  Prune juice may be helpful.  You may use a stool softener, such as Colace (over the counter) 100 mg twice a day.  Use MiraLax (over the counter) for constipation as needed.     Diet - low sodium heart healthy    Complete by:  As directed      Discharge instructions    Complete by:  As directed   Increase your activities as comfort allows. You can get your current dressing wet daily in the shower.     Discharge patient    Complete by:  As directed      Increase activity slowly as tolerated    Complete by:  As directed            Follow-up Information   Follow up with Kathryne Hitch, MD In 2 weeks.   Specialty:  Orthopedic Surgery   Contact information:    [address] [place] [address] [phone]        Signed: Kathryne Hitch 08/01/2014, 8:30 AM

## 2014-08-01 NOTE — Progress Notes (Signed)
CARE MANAGEMENT NOTE 08/01/2014  Patient:  Kelly Taylor, Kelly Taylor   Account Number:  [id number]  Date Initiated:  07/31/2014  Documentation initiated by:  DAVIS,RHONDA  Subjective/Objective Assessment:   left total hip     Action/Plan:   home with hhc/reports that she has all needed dme at home   Anticipated DC Date:  08/01/2014   Anticipated DC Plan:  HOME W HOME HEALTH SERVICES  In-house referral  NA      DC Planning Services  CM consult      [facility] Choice  NA   Choice offered to / List presented to:  C-1 Patient   DME arranged  NA      DME agency  NA     HH arranged  HH-2 PT      [facility] agency  [facility]   Status of service:  Completed, signed off Medicare Important Message given?   (If response is "NO", the following Medicare IM given date fields will be blank) Date Medicare IM given:   Medicare IM given by:   Date Additional Medicare IM given:   Additional Medicare IM given by:    Discharge Disposition:  HOME W HOME HEALTH SERVICES  Per UR Regulation:  Reviewed for med. necessity/level of care/duration of stay  If discussed at Long Length of Stay Meetings, dates discussed:    Comments:  08/01/2014 1200 NCM spoke to pt and agreeable to [place] for [facility]. Pt has DME at home. Isidoro Donning RN CCM Case Mgmt phone [phone]   [phone] [place],RN,BSN,CCM

## 2014-08-01 NOTE — Progress Notes (Signed)
Physical Therapy Treatment Patient Details Name: Kelly Taylor MRN: [id number] DOB: 06-05-1951 Today's Date: 08/01/2014    History of Present Illness R THA ant approach    PT Comments    Pt motivated but ltd by onset dizziness/nausea with ambulation  Follow Up Recommendations  Home health PT     Equipment Recommendations  None recommended by PT    Recommendations for Other Services OT consult     Precautions / Restrictions Precautions Precautions: None Restrictions Weight Bearing Restrictions: No RLE Weight Bearing: Weight bearing as tolerated    Mobility  Bed Mobility Overal bed mobility: Needs Assistance Bed Mobility: Supine to Sit     Supine to sit: Min assist     General bed mobility comments: cues for sequence and min assist for LEs with pt self assisting R LE with L LE  Transfers Overall transfer level: Needs assistance Equipment used: Rolling walker (2 wheeled) Transfers: Sit to/from Stand Sit to Stand: Supervision         General transfer comment: cues for LE management and use of UEs to self assist  Ambulation/Gait Ambulation/Gait assistance: Min assist Ambulation Distance (Feet): 44 Feet Assistive device: Rolling walker (2 wheeled) Gait Pattern/deviations: Step-to pattern;Decreased step length - right;Decreased step length - left;Shuffle;Trunk flexed Gait velocity: decr   General Gait Details: cues for posture, stride length, position from RW and initial sequence - ltd by onset nausea/dizziness  - BP 121/63, RN aware   Stairs            Wheelchair Mobility    Modified Rankin (Stroke Patients Only)       Balance                                    Cognition Arousal/Alertness: Awake/alert Behavior During Therapy: WFL for tasks assessed/performed Overall Cognitive Status: Within Functional Limits for tasks assessed                      Exercises Total Joint Exercises Ankle Circles/Pumps: AROM;Both;15  reps;Supine Quad Sets: AROM;Both;10 reps;Supine Heel Slides: AAROM;Right;Supine;20 reps Hip ABduction/ADduction: AAROM;Right;Supine;15 reps    General Comments        Pertinent Vitals/Pain Pain Assessment: 0-10 Pain Score: 3  Pain Location: R hip Pain Descriptors / Indicators: Aching;Burning Pain Intervention(s): Limited activity within patient's tolerance;Monitored during session;Premedicated before session;Ice applied    Home Living                      Prior Function            PT Goals (current goals can now be found in the care plan section) Acute Rehab PT Goals Patient Stated Goal: to go home PT Goal Formulation: With patient Time For Goal Achievement: 08/07/14 Potential to Achieve Goals: Good Progress towards PT goals: Progressing toward goals    Frequency  7X/week    PT Plan Current plan remains appropriate    Co-evaluation             End of Session Equipment Utilized During Treatment: Gait belt Activity Tolerance: Patient tolerated treatment well Patient left: in chair;with call bell/phone within reach     Time: 0900-0930 PT Time Calculation (min): 30 min  Charges:  $Gait Training: 8-22 mins $Therapeutic Exercise: 8-22 mins                    G Codes:  Malak Orantes 08/01/2014, 12:52 PM

## 2014-08-01 NOTE — Progress Notes (Signed)
Subjective: 2 Days Post-Op Procedure(s) (LRB): RIGHT TOTAL HIP ARTHROPLASTY ANTERIOR APPROACH (Right) Patient reports pain as moderate.  Doing well with therapy.  Wants to go home today.  Objective: Vital signs in last 24 hours: Temp:  [98.5 F (36.9 C)-99.6 F (37.6 C)] 99.6 F (37.6 C) (09/12 0445) Pulse Rate:  [74-96] 80 (09/12 0445) Resp:  [14-18] 16 (09/12 0445) BP: (99-115)/(52-57) 115/54 mmHg (09/12 0445) SpO2:  [88 %-94 %] 90 % (09/12 0446)  Intake/Output from previous day: 09/11 0701 - 09/12 0700 In: 1310 [P.O.:480; I.V.:830] Out: 1100 [Urine:1100] Intake/Output this shift: Total I/O In: -  Out: 400 [Urine:400]   Recent Labs  07/31/14 0433 08/01/14 0433  HGB 10.7* 10.1*    Recent Labs  07/31/14 0433 08/01/14 0433  WBC 8.7 8.3  RBC 3.82* 3.57*  HCT 33.7* 31.5*  PLT 179 162    Recent Labs  07/31/14 0433  NA 138  K 3.8  CL 101  CO2 26  BUN 12  CREATININE 0.71  GLUCOSE 131*  CALCIUM 8.5   No results found for this basename: LABPT, INR,  in the last 72 hours  Sensation intact distally Intact pulses distally Dorsiflexion/Plantar flexion intact Incision: dressing C/D/I  Assessment/Plan: 2 Days Post-Op Procedure(s) (LRB): RIGHT TOTAL HIP ARTHROPLASTY ANTERIOR APPROACH (Right) Discharge home with home health  Kathryne Hitch 08/01/2014, 8:25 AM

## 2014-08-01 NOTE — Plan of Care (Signed)
Problem: Consults Goal: Diagnosis- Total Joint Replacement Outcome: Completed/Met Date Met:  08/01/14 Primary Total Hip RIGHT, Anterior  Problem: Phase III Progression Outcomes Goal: Anticoagulant follow-up in place Outcome: Not Applicable Date Met:  83/29/19 Xarelto VTE, no f/u needed.

## 2014-08-01 NOTE — Progress Notes (Signed)
Physical Therapy Treatment Patient Details Name: Kelly Taylor MRN: [id number] DOB: 1950/12/19 Today's Date: 08/01/2014    History of Present Illness R THA ant approach    PT Comments    Marked improvement in activity tolerance vs am session with no c/o nausea or dizziness  Follow Up Recommendations  Home health PT     Equipment Recommendations  None recommended by PT    Recommendations for Other Services OT consult     Precautions / Restrictions Precautions Precautions: Fall Restrictions Weight Bearing Restrictions: No RLE Weight Bearing: Weight bearing as tolerated    Mobility  Bed Mobility Overal bed mobility: Needs Assistance Bed Mobility: Supine to Sit     Supine to sit: Min assist     General bed mobility comments: cues for sequence and min assist for LEs with pt self assisting R LE with L LE  Transfers Overall transfer level: Needs assistance Equipment used: Rolling walker (2 wheeled) Transfers: Sit to/from Stand Sit to Stand: Supervision         General transfer comment: cues for LE management and use of UEs to self assist  Ambulation/Gait Ambulation/Gait assistance: Min guard;Supervision Ambulation Distance (Feet): 200 Feet Assistive device: Rolling walker (2 wheeled) Gait Pattern/deviations: Step-to pattern;Step-through pattern;Shuffle;Trunk flexed Gait velocity: decr   General Gait Details: cues for posture and position from RW   Stairs Stairs: Yes Stairs assistance: Min guard Stair Management: No rails;Forwards;With walker;Step to pattern Number of Stairs: 2 General stair comments: pt placing RW on 2nd step and using for support to navigate stairs  Wheelchair Mobility    Modified Rankin (Stroke Patients Only)       Balance                                    Cognition Arousal/Alertness: Awake/alert Behavior During Therapy: WFL for tasks assessed/performed Overall Cognitive Status: Within Functional Limits for  tasks assessed                      Exercises Total Joint Exercises Ankle Circles/Pumps: AROM;Both;15 reps;Supine Quad Sets: AROM;Both;10 reps;Supine Heel Slides: AAROM;Right;Supine;20 reps Hip ABduction/ADduction: AAROM;Right;Supine;15 reps    General Comments        Pertinent Vitals/Pain Pain Assessment: 0-10 Pain Score: 3  Pain Location: R hip Pain Descriptors / Indicators: Aching Pain Intervention(s): Limited activity within patient's tolerance;Monitored during session;Premedicated before session;Ice applied    Home Living                      Prior Function            PT Goals (current goals can now be found in the care plan section) Acute Rehab PT Goals Patient Stated Goal: to go home PT Goal Formulation: With patient Time For Goal Achievement: 08/07/14 Potential to Achieve Goals: Good Progress towards PT goals: Progressing toward goals    Frequency  7X/week    PT Plan Current plan remains appropriate    Co-evaluation             End of Session Equipment Utilized During Treatment: Gait belt Activity Tolerance: Patient tolerated treatment well Patient left: in chair;with call bell/phone within reach;with family/visitor present     Time: 7829-5621 PT Time Calculation (min): 21 min  Charges:  $Gait Training: 8-22 mins $Therapeutic Exercise: 8-22 mins  G Codes:      Kelly Taylor 08/20/14, 12:56 PM

## 2014-09-21 ENCOUNTER — Encounter (HOSPITAL_COMMUNITY): Payer: Self-pay | Admitting: *Deleted

## 2015-05-22 ENCOUNTER — Ambulatory Visit (INDEPENDENT_AMBULATORY_CARE_PROVIDER_SITE_OTHER): Payer: Worker's Compensation | Admitting: Family Medicine

## 2015-05-22 VITALS — BP 132/76 | HR 95 | Temp 98.3°F | Resp 17 | Ht 67.5 in | Wt 315.6 lb

## 2015-05-22 DIAGNOSIS — L089 Local infection of the skin and subcutaneous tissue, unspecified: Secondary | ICD-10-CM | POA: Diagnosis not present

## 2015-05-22 DIAGNOSIS — L03116 Cellulitis of left lower limb: Secondary | ICD-10-CM | POA: Diagnosis not present

## 2015-05-22 DIAGNOSIS — S80862A Insect bite (nonvenomous), left lower leg, initial encounter: Secondary | ICD-10-CM | POA: Diagnosis not present

## 2015-05-22 DIAGNOSIS — W57XXXA Bitten or stung by nonvenomous insect and other nonvenomous arthropods, initial encounter: Principal | ICD-10-CM

## 2015-05-22 MED ORDER — DOXYCYCLINE HYCLATE 100 MG PO TABS: 100.0000 mg | ORAL_TABLET | Freq: Two times a day (BID) | ORAL | Status: DC

## 2015-05-22 NOTE — Patient Instructions (Addendum)
It appears that you may have a secondarily infected bite - from spider or stinging insect. Start doxycyline, elevate leg, keep wound elevated as able, clean with soap and water twice per day, warm compresses few times per day. Ok to take benadryl up to every 4 hours as needed for itching.  Recheck with myself tomorrow if further spreading of redness, or other provider in next 2-3 days to recheck as long as area is improving.  Sooner if worse.   Return to the clinic or go to the nearest emergency room if any of your symptoms worsen or new symptoms occur.  Cellulitis Cellulitis is an infection of the skin and the tissue beneath it. The infected area is usually red and tender. Cellulitis occurs most often in the arms and lower legs.  CAUSES  Cellulitis is caused by bacteria that enter the skin through cracks or cuts in the skin. The most common types of bacteria that cause cellulitis are staphylococci and streptococci. SIGNS AND SYMPTOMS   Redness and warmth.  Swelling.  Tenderness or pain.  Fever. DIAGNOSIS  Your health care provider can usually determine what is wrong based on a physical exam. Blood tests may also be done. TREATMENT  Treatment usually involves taking an antibiotic medicine. HOME CARE INSTRUCTIONS   Take your antibiotic medicine as directed by your health care provider. Finish the antibiotic even if you start to feel better.  Keep the infected arm or leg elevated to reduce swelling.  Apply a warm cloth to the affected area up to 4 times per day to relieve pain.  Take medicines only as directed by your health care provider.  Keep all follow-up visits as directed by your health care provider. SEEK MEDICAL CARE IF:   You notice red streaks coming from the infected area.  Your red area gets larger or turns dark in color.  Your bone or joint underneath the infected area becomes painful after the skin has healed.  Your infection returns in the same area or another  area.  You notice a swollen bump in the infected area.  You develop new symptoms.  You have a fever. SEEK IMMEDIATE MEDICAL CARE IF:   You feel very sleepy.  You develop vomiting or diarrhea.  You have a general ill feeling (malaise) with muscle aches and pains. MAKE SURE YOU:   Understand these instructions.  Will watch your condition.  Will get help right away if you are not doing well or get worse. Document Released: 08/16/2005 Document Revised: 03/23/2014 Document Reviewed: 01/22/2012 [facility] Patient Information 2015 [place] EdgeExitCare, [place]LL[place]. This information is not intended to replace advice given to you by your health care provider. Make sure you discuss any questions you have with your health care provider.

## 2015-05-22 NOTE — Progress Notes (Signed)
Subjective:  This chart was scribed for Meredith Staggers, MD by Andrew Au, ED Scribe. This patient was seen in room 5 and the patient's care was started at 1:48 PM.   Patient ID: Kelly Taylor, female    DOB: 1951/07/26, 64 y.o.   MRN: [id number]  HPI Chief Complaint  Patient presents with  . Insect Bite    left leg, red , spreading    HPI Comments: Kelly Taylor is a 64 y.o. female who presents to the [facility] for a worker's compensation regarding an insect bite to left leg. Pt works at [employer] and states while working on the computer in [place] around [employer] she felt a sting to left lower leg and noticed some bleeding shortly after. Later that night she apply ice and peroxide to area and woke up this morning with increased redness, warmth and swelling with some itchiness and pain. She reports there being hoarse flies present at her work place. She denies there being a bump or blister to area prior. She denies fever, chills, and feeling ill. She is allergic to penicillin, Sulfa, and relafen. She denies hx of skin infection. Her last tetanus was yesterday during her physical.   Patient Active Problem List   Diagnosis Date Noted  . Arthritis of right hip 07/30/2014  . Status post total replacement of right hip 07/30/2014  . Postoperative anemia due to acute blood loss 04/08/2013  . OA (osteoarthritis) of knee 04/07/2013   Past Medical History  Diagnosis Date  . Arthritis   . Hx of bladder infections   . History of measles, mumps, or rubella as child    all 3  . History of chicken pox   . Trichomonas   . Yeast in stool   . Menopausal symptoms 2003  . Breast mass, left 2003  . Urge incontinence 2005  . Obesity, morbid 2006  . Hypothyroidism   . Chronic back pain     seeing Pain specialist  . Hemorrhoids   . Polyp of colon     removed during colonoscopy  . Vertigo     over a year was last episode  . Irritable bowel syndrome   . H/O scarlet fever   .  GERD (gastroesophageal reflux disease)   . Diabetes mellitus     borderline  . DDD (degenerative disc disease)   . DJD (degenerative joint disease)   . Anemia     hx of in 20's  . Pericarditis     hx of  . Pneumonia     hx of 5 years ago  . Lumbar stenosis     RFA done May 2015  . Complication of anesthesia     "shallow breathing with general"  . Spinal headache     x1   Past Surgical History  Procedure Laterality Date  . Knee surgery      bilateral knee; x3 right/ x2 left  . Rotator cuff repair  2008    right, almost complete reconstruction, bone anchors  . Breast reduction surgery  1992  . Dilation and curettage of uterus    . Abdominal hysterectomy  02/1990  . Hand surgery Right     right-Dr. Amanda Pea  . Bladder suspension    . Total knee arthroplasty Right 04/07/2013    Procedure: RIGHT TOTAL KNEE ARTHROPLASTY;  Surgeon: Loanne Drilling, MD;  Location: [facility];  Service: Orthopedics;  Laterality: Right;  . Interstim implant placement Right 2003  for incontinence  . Breast lumpectomy Left 2003  . Foot surgery Right     pinched nerve  . Appendectomy  age 89  . Total knee arthroplasty Left 09/22/2013    Procedure: LEFT TOTAL KNEE ARTHROPLASTY;  Surgeon: Loanne Drilling, MD;  Location: [facility];  Service: Orthopedics;  Laterality: Left;  . Cholecystectomy  2010  . Colonoscopy  may 2015    4 polyps-benign  . Tonsillectomy  age 51  . Hernia repair Right 1992  . Total hip arthroplasty Right 07/30/2014    Procedure: RIGHT TOTAL HIP ARTHROPLASTY ANTERIOR APPROACH;  Surgeon: Kathryne Hitch, MD;  Location: [facility];  Service: Orthopedics;  Laterality: Right;   Allergies  Allergen Reactions  . Penicillins Anaphylaxis and Hives  . Oxycodone Hcl Other (See Comments)    Oxycontin-"turned purple"  . Relafen [Nabumetone] Nausea And Vomiting  . Sulfa Antibiotics Other (See Comments)    fever   Prior to Admission medications   Medication Sig Start Date End Date Taking?  Authorizing Provider  calcium carbonate (OS-CAL) 600 MG TABS tablet Take 1,200 mg by mouth daily with breakfast.    Yes Historical Provider, MD  docusate sodium (COLACE) 100 MG capsule Take 100 mg by mouth daily as needed for constipation.   Yes Historical Provider, MD  HYDROcodone-acetaminophen (NORCO) 10-325 MG per tablet Take 1-2 tablets by mouth every 4 (four) hours as needed for severe pain. 08/01/14  Yes Kathryne Hitch, MD  levothyroxine (SYNTHROID, LEVOTHROID) 50 MCG tablet Take 50 mcg by mouth daily before breakfast.   Yes Historical Provider, MD  Linaclotide (LINZESS) 145 MCG CAPS capsule Take 145 mcg by mouth daily.   Yes Historical Provider, MD  lubiprostone (AMITIZA) 24 MCG capsule Take 24 mcg by mouth 2 (two) times daily with a meal.    Yes Historical Provider, MD  methocarbamol (ROBAXIN) 500 MG tablet Take 500 mg by mouth every 8 (eight) hours as needed for muscle spasms.   Yes Historical Provider, MD  Multiple Vitamins-Minerals (MULTIVITAMIN WITH MINERALS) tablet Take 1 tablet by mouth daily.   Yes Historical Provider, MD  polyethylene glycol (MIRALAX / GLYCOLAX) packet Take 17 g by mouth daily as needed for mild constipation (for constipation).    Yes Historical Provider, MD  Probiotic Product (PROBIOTIC DAILY PO) Take 1 capsule by mouth daily.   Yes Historical Provider, MD  solifenacin (VESICARE) 10 MG tablet Take 10 mg by mouth daily.    Yes Historical Provider, MD  traMADol (ULTRAM) 50 MG tablet Take 100 mg by mouth every 6 (six) hours as needed for moderate pain.   Yes Historical Provider, MD   History   Social History  . Marital Status: Married    Spouse Name: N/A  . Number of Children: N/A  . Years of Education: N/A   Occupational History  . Not on file.   Social History Main Topics  . Smoking status: Never Smoker   . Smokeless tobacco: Never Used  . Alcohol Use: No  . Drug Use: No  . Sexual Activity: Yes    Birth Control/ Protection: Surgical     Comment:  hyst   Other Topics Concern  . Not on file   Social History Narrative   Review of Systems  Constitutional: Negative for fever and chills.  Gastrointestinal: Negative for nausea and vomiting.  Skin: Positive for color change and wound.   Objective:   Physical Exam  Constitutional: She is oriented to person, place, and time. She appears well-developed  and well-nourished. No distress.  HENT:  Head: Normocephalic and atraumatic.  Eyes: Conjunctivae and EOM are normal.  Neck: Neck supple.  Cardiovascular: Normal rate.   Pulmonary/Chest: Effort normal.  Musculoskeletal: Normal range of motion.  Neurological: She is alert and oriented to person, place, and time.  Skin: Skin is warm and dry.  Left lower leg -erythema measuring 13cm x15cm. Warmth in that same area with a central indurated slightly darker red area measuring 6cm x 3 cm. Center most aspect with open wound measuring 3-75mm draining clear fluid.  Psychiatric: She has a normal mood and affect. Her behavior is normal.  Nursing note and vitals reviewed.  Filed Vitals:   05/22/15 1307  BP: 132/76  Pulse: 95  Temp: 98.3 F (36.8 C)  TempSrc: Oral  Resp: 17  Height: 5' 7.5" (1.715 m)  Weight: 315 lb 9.6 oz (143.155 kg)  SpO2: 92%   Assessment & Plan:   Kelly Taylor is a 64 y.o. female Insect bite of leg, infected, left, initial encounter - Plan: doxycycline (VIBRA-TABS) 100 MG tablet, Wound culture  Cellulitis of leg, left - Plan: doxycycline (VIBRA-TABS) 100 MG tablet, Wound culture    -Suspected secondary cellulitis after initial volume insect or spider bite.  -Start doxycycline 1 pill twice per day for 10 days.  -Recheck in 2-3 days,  sooner if worse.   -Note provided for work - no restrictions, keep wound clean/covered.   Meds ordered this encounter  . doxycycline (VIBRA-TABS) 100 MG tablet    Sig: Take 1 tablet (100 mg total) by mouth 2 (two) times daily.    Dispense:  20 tablet    Refill:  0   Patient  Instructions  It appears that you may have a secondarily infected bite - from spider or stinging insect. Start doxycyline, elevate leg, keep wound elevated as able, clean with soap and water twice per day, warm compresses few times per day. Ok to take benadryl up to every 4 hours as needed for itching.  Recheck with myself tomorrow if further spreading of redness, or other provider in next 2-3 days to recheck as long as area is improving.  Sooner if worse.   Return to the clinic or go to the nearest emergency room if any of your symptoms worsen or new symptoms occur.  Cellulitis Cellulitis is an infection of the skin and the tissue beneath it. The infected area is usually red and tender. Cellulitis occurs most often in the arms and lower legs.  CAUSES  Cellulitis is caused by bacteria that enter the skin through cracks or cuts in the skin. The most common types of bacteria that cause cellulitis are staphylococci and streptococci. SIGNS AND SYMPTOMS   Redness and warmth.  Swelling.  Tenderness or pain.  Fever. DIAGNOSIS  Your health care provider can usually determine what is wrong based on a physical exam. Blood tests may also be done. TREATMENT  Treatment usually involves taking an antibiotic medicine. HOME CARE INSTRUCTIONS   Take your antibiotic medicine as directed by your health care provider. Finish the antibiotic even if you start to feel better.  Keep the infected arm or leg elevated to reduce swelling.  Apply a warm cloth to the affected area up to 4 times per day to relieve pain.  Take medicines only as directed by your health care provider.  Keep all follow-up visits as directed by your health care provider. SEEK MEDICAL CARE IF:   You notice red streaks coming from the  infected area.  Your red area gets larger or turns dark in color.  Your bone or joint underneath the infected area becomes painful after the skin has healed.  Your infection returns in the same area  or another area.  You notice a swollen bump in the infected area.  You develop new symptoms.  You have a fever. SEEK IMMEDIATE MEDICAL CARE IF:   You feel very sleepy.  You develop vomiting or diarrhea.  You have a general ill feeling (malaise) with muscle aches and pains. MAKE SURE YOU:   Understand these instructions.  Will watch your condition.  Will get help right away if you are not doing well or get worse. Document Released: 08/16/2005 Document Revised: 03/23/2014 Document Reviewed: 01/22/2012 [facility] Patient Information 2015 [place]resExitCare, [place]LL[place]. This information is not intended to replace advice given to you by your health care provider. Make sure you discuss any questions you have with your health care provider.

## 2015-05-25 LAB — WOUND CULTURE
GRAM STAIN: NONE SEEN
GRAM STAIN: NONE SEEN
Gram Stain: NONE SEEN
Organism ID, Bacteria: NO GROWTH

## 2015-05-26 ENCOUNTER — Telehealth: Payer: Self-pay

## 2015-05-26 NOTE — Telephone Encounter (Signed)
Pt called about labs. Let her know that the wound culture was negative.

## 2015-11-15 ENCOUNTER — Encounter (HOSPITAL_COMMUNITY): Payer: Self-pay

## 2015-11-15 ENCOUNTER — Emergency Department (HOSPITAL_COMMUNITY)
Admission: EM | Admit: 2015-11-15 | Discharge: 2015-11-15 | Disposition: A | Discharge status: Home / Self Care | Payer: BLUE CROSS/BLUE SHIELD | Source: Home / Self Care | Attending: Emergency Medicine | Admitting: Emergency Medicine

## 2015-11-15 DIAGNOSIS — J209 Acute bronchitis, unspecified: Secondary | ICD-10-CM | POA: Diagnosis not present

## 2015-11-15 MED ORDER — IPRATROPIUM-ALBUTEROL 0.5-2.5 (3) MG/3ML IN SOLN
RESPIRATORY_TRACT | Status: AC
  Filled 2015-11-15: qty 3

## 2015-11-15 MED ORDER — IPRATROPIUM-ALBUTEROL 0.5-2.5 (3) MG/3ML IN SOLN
3.0000 mL | Freq: Once | RESPIRATORY_TRACT | Status: AC
  Administered 2015-11-15: 3 mL via RESPIRATORY_TRACT

## 2015-11-15 MED ORDER — ALBUTEROL SULFATE HFA 108 (90 BASE) MCG/ACT IN AERS: 2.0000 | INHALATION_SPRAY | RESPIRATORY_TRACT | Status: AC | PRN

## 2015-11-15 MED ORDER — HYDROCOD POLST-CPM POLST ER 10-8 MG/5ML PO SUER: 5.0000 mL | Freq: Two times a day (BID) | ORAL | Status: DC | PRN

## 2015-11-15 MED ORDER — AZITHROMYCIN 250 MG PO TABS: ORAL_TABLET | ORAL | Status: DC

## 2015-11-15 NOTE — Discharge Instructions (Signed)
Upper Respiratory Infection, Adult Most upper respiratory infections (URIs) are a viral infection of the air passages leading to the lungs. A URI affects the nose, throat, and upper air passages. The most common type of URI is nasopharyngitis and is typically referred to as "the common cold." URIs run their course and usually go away on their own. Most of the time, a URI does not require medical attention, but sometimes a bacterial infection in the upper airways can follow a viral infection. This is called a secondary infection. Sinus and middle ear infections are common types of secondary upper respiratory infections. Bacterial pneumonia can also complicate a URI. A URI can worsen asthma and chronic obstructive pulmonary disease (COPD). Sometimes, these complications can require emergency medical care and may be life threatening.  CAUSES Almost all URIs are caused by viruses. A virus is a type of germ and can spread from one person to another.  RISKS FACTORS You may be at risk for a URI if:   You smoke.   You have chronic heart or lung disease.  You have a weakened defense (immune) system.   You are very young or very old.   You have nasal allergies or asthma.  You work in crowded or poorly ventilated areas.  You work in health care facilities or schools. SIGNS AND SYMPTOMS  Symptoms typically develop 2-3 days after you come in contact with a cold virus. Most viral URIs last 7-10 days. However, viral URIs from the influenza virus (flu virus) can last 14-18 days and are typically more severe. Symptoms may include:   Runny or stuffy (congested) nose.   Sneezing.   Cough.   Sore throat.   Headache.   Fatigue.   Fever.   Loss of appetite.   Pain in your forehead, behind your eyes, and over your cheekbones (sinus pain).  Muscle aches.  DIAGNOSIS  Your health care provider may diagnose a URI by:  Physical exam.  Tests to check that your symptoms are not due to  another condition such as:  Strep throat.  Sinusitis.  Pneumonia.  Asthma. TREATMENT  A URI goes away on its own with time. It cannot be cured with medicines, but medicines may be prescribed or recommended to relieve symptoms. Medicines may help:  Reduce your fever.  Reduce your cough.  Relieve nasal congestion. HOME CARE INSTRUCTIONS   Take medicines only as directed by your health care provider.   Gargle warm saltwater or take cough drops to comfort your throat as directed by your health care provider.  Use a warm mist humidifier or inhale steam from a shower to increase air moisture. This may make it easier to breathe.  Drink enough fluid to keep your urine clear or pale yellow.   Eat soups and other clear broths and maintain good nutrition.   Rest as needed.   Return to work when your temperature has returned to normal or as your health care provider advises. You may need to stay home longer to avoid infecting others. You can also use a face mask and careful hand washing to prevent spread of the virus.  Increase the usage of your inhaler if you have asthma.   Do not use any tobacco products, including cigarettes, chewing tobacco, or electronic cigarettes. If you need help quitting, ask your health care provider. PREVENTION  The best way to protect yourself from getting a cold is to practice good hygiene.   Avoid oral or hand contact with people with cold   symptoms.   Wash your hands often if contact occurs.  There is no clear evidence that vitamin C, vitamin E, echinacea, or exercise reduces the chance of developing a cold. However, it is always recommended to get plenty of rest, exercise, and practice good nutrition.  SEEK MEDICAL CARE IF:   You are getting worse rather than better.   Your symptoms are not controlled by medicine.   You have chills.  You have worsening shortness of breath.  You have brown or red mucus.  You have yellow or brown nasal  discharge.  You have pain in your face, especially when you bend forward.  You have a fever.  You have swollen neck glands.  You have pain while swallowing.  You have white areas in the back of your throat. SEEK IMMEDIATE MEDICAL CARE IF:   You have severe or persistent:  Headache.  Ear pain.  Sinus pain.  Chest pain.  You have chronic lung disease and any of the following:  Wheezing.  Prolonged cough.  Coughing up blood.  A change in your usual mucus.  You have a stiff neck.  You have changes in your:  Vision.  Hearing.  Thinking.  Mood. MAKE SURE YOU:   Understand these instructions.  Will watch your condition.  Will get help right away if you are not doing well or get worse.   This information is not intended to replace advice given to you by your health care provider. Make sure you discuss any questions you have with your health care provider.   Document Released: 05/02/2001 Document Revised: 03/23/2015 Document Reviewed: 02/11/2014 [employer] Interactive Patient Education 2016 [employer]  

## 2015-11-15 NOTE — ED Provider Notes (Signed)
CSN: [id number]     Arrival date & time 11/15/15  1301 History   None    Chief Complaint  Patient presents with  . Influenza   (Consider location/radiation/quality/duration/timing/severity/associated sxs/prior Treatment) HPI History obtained from patient:   LOCATION:upper resp SEVERITY: DURATION:since last Friday CONTEXT:sudden onset QUALITY:similar to past symptoms MODIFYING FACTORS: OTC meds without relief ASSOCIATED SYMPTOMS: itchy eyes TIMING: constant    Past Medical History  Diagnosis Date  . Arthritis   . Hx of bladder infections   . History of measles, mumps, or rubella as child    all 3  . History of chicken pox   . Trichomonas   . Yeast in stool   . Menopausal symptoms 2003  . Breast mass, left 2003  . Urge incontinence 2005  . Obesity, morbid (HCC) 2006  . Hypothyroidism   . Chronic back pain     seeing Pain specialist  . Hemorrhoids   . Polyp of colon     removed during colonoscopy  . Vertigo     over a year was last episode  . Irritable bowel syndrome   . H/O scarlet fever   . GERD (gastroesophageal reflux disease)   . Diabetes mellitus     borderline  . DDD (degenerative disc disease)   . DJD (degenerative joint disease)   . Anemia     hx of in 20's  . Pericarditis     hx of  . Pneumonia     hx of 5 years ago  . Lumbar stenosis     RFA done May 2015  . Complication of anesthesia     "shallow breathing with general"  . Spinal headache     x1   Past Surgical History  Procedure Laterality Date  . Knee surgery      bilateral knee; x3 right/ x2 left  . Rotator cuff repair  2008    right, almost complete reconstruction, bone anchors  . Breast reduction surgery  1992  . Dilation and curettage of uterus    . Abdominal hysterectomy  02/1990  . Hand surgery Right     right-Dr. Amanda Pea  . Bladder suspension    . Total knee arthroplasty Right 04/07/2013    Procedure: RIGHT TOTAL KNEE ARTHROPLASTY;  Surgeon: Loanne Drilling, MD;  Location: [facility];  Service: Orthopedics;  Laterality: Right;  . Interstim implant placement Right 2003    for incontinence  . Breast lumpectomy Left 2003  . Foot surgery Right     pinched nerve  . Appendectomy  age 43  . Total knee arthroplasty Left 09/22/2013    Procedure: LEFT TOTAL KNEE ARTHROPLASTY;  Surgeon: Loanne Drilling, MD;  Location: [facility];  Service: Orthopedics;  Laterality: Left;  . Cholecystectomy  2010  . Colonoscopy  may 2015    4 polyps-benign  . Tonsillectomy  age 32  . Hernia repair Right 1992  . Total hip arthroplasty Right 07/30/2014    Procedure: RIGHT TOTAL HIP ARTHROPLASTY ANTERIOR APPROACH;  Surgeon: Kathryne Hitch, MD;  Location: [facility];  Service: Orthopedics;  Laterality: Right;   Family History  Problem Relation Age of Onset  . Lung cancer Mother   . Stroke Paternal Grandfather   . Ovarian cancer Maternal Aunt   . Breast cancer Maternal Aunt    Social History  Substance Use Topics  . Smoking status: Never Smoker   . Smokeless tobacco: Never Used  . Alcohol Use: No   OB History  Gravida Para Term Preterm AB TAB SAB Ectopic Multiple Living   3              Review of Systems ROS +'ve cough  Denies: HEADACHE, NAUSEA, ABDOMINAL PAIN, CHEST PAIN, CONGESTION, DYSURIA, SHORTNESS OF BREATH  Allergies  Penicillins; Oxycodone hcl; Relafen; and Sulfa antibiotics  Home Medications   Prior to Admission medications   Medication Sig Start Date End Date Taking? Authorizing Provider  calcium carbonate (OS-CAL) 600 MG TABS tablet Take 1,200 mg by mouth daily with breakfast.     Historical Provider, MD  docusate sodium (COLACE) 100 MG capsule Take 100 mg by mouth daily as needed for constipation.    Historical Provider, MD  doxycycline (VIBRA-TABS) 100 MG tablet Take 1 tablet (100 mg total) by mouth 2 (two) times daily. 05/22/15   Shade Flood, MD  HYDROcodone-acetaminophen (NORCO) 10-325 MG per tablet Take 1-2 tablets by mouth every 4 (four) hours as needed  for severe pain. 08/01/14   Kathryne Hitch, MD  levothyroxine (SYNTHROID, LEVOTHROID) 50 MCG tablet Take 50 mcg by mouth daily before breakfast.    Historical Provider, MD  Linaclotide (LINZESS) 145 MCG CAPS capsule Take 145 mcg by mouth daily.    Historical Provider, MD  lubiprostone (AMITIZA) 24 MCG capsule Take 24 mcg by mouth 2 (two) times daily with a meal.     Historical Provider, MD  methocarbamol (ROBAXIN) 500 MG tablet Take 500 mg by mouth every 8 (eight) hours as needed for muscle spasms.    Historical Provider, MD  Multiple Vitamins-Minerals (MULTIVITAMIN WITH MINERALS) tablet Take 1 tablet by mouth daily.    Historical Provider, MD  polyethylene glycol (MIRALAX / GLYCOLAX) packet Take 17 g by mouth daily as needed for mild constipation (for constipation).     Historical Provider, MD  Probiotic Product (PROBIOTIC DAILY PO) Take 1 capsule by mouth daily.    Historical Provider, MD  solifenacin (VESICARE) 10 MG tablet Take 10 mg by mouth daily.     Historical Provider, MD  traMADol (ULTRAM) 50 MG tablet Take 100 mg by mouth every 6 (six) hours as needed for moderate pain.    Historical Provider, MD   Meds Ordered and Administered this Visit  Medications - No data to display  BP 124/52 mmHg  Pulse 94  Temp(Src) 98.3 F (36.8 C) (Oral)  Resp 16  SpO2 95% No data found.   Physical Exam  Constitutional: She is oriented to person, place, and time. She appears well-developed and well-nourished.  HENT:  Head: Normocephalic.  Right Ear: External ear normal.  Left Ear: External ear normal.  Mouth/Throat: Oropharynx is clear and moist. No oropharyngeal exudate.  Eyes: Conjunctivae are normal.  Neck: Normal range of motion. Neck supple.  Cardiovascular: Normal rate, regular rhythm and normal heart sounds.   Pulmonary/Chest: Effort normal and breath sounds normal.  Abdominal: Soft. Bowel sounds are normal.  Musculoskeletal: Normal range of motion.  Neurological: She is alert  and oriented to person, place, and time.  Skin: Skin is warm and dry.  Psychiatric: She has a normal mood and affect. Her behavior is normal. Judgment and thought content normal.    ED Course  Procedures (including critical care time)  Labs Review Labs Reviewed - No data to display  Imaging Review No results found.   Visual Acuity Review  Right Eye Distance:   Left Eye Distance:   Bilateral Distance:    Right Eye Near:   Left Eye Near:  Bilateral Near:         MDM   1. Bronchospasm with bronchitis, acute     States she feels much better after neb treatment.  Patient is advised to continue home symptomatic treatment. Prescriptions for zpak, tussionex, albuterol are provided to the patient. Patient is advised that if there are new or worsening symptoms or attend the emergency department, or contact primary care provider. Instructions of care provided discharged home in stable condition.  THIS NOTE WAS GENERATED USING A VOICE RECOGNITION SOFTWARE PROGRAM. ALL REASONABLE EFFORTS  WERE MADE TO PROOFREAD THIS DOCUMENT FOR ACCURACY.   Tharon AquasFrank C Krystyl Cannell, PA 11/15/15 1430

## 2015-11-15 NOTE — ED Notes (Signed)
C/o onset 12-23 of cough, fever, body aches

## 2017-01-22 ENCOUNTER — Encounter (INDEPENDENT_AMBULATORY_CARE_PROVIDER_SITE_OTHER): Payer: Self-pay | Admitting: Physician Assistant

## 2017-01-22 ENCOUNTER — Encounter (INDEPENDENT_AMBULATORY_CARE_PROVIDER_SITE_OTHER): Payer: Self-pay

## 2017-01-22 ENCOUNTER — Ambulatory Visit (INDEPENDENT_AMBULATORY_CARE_PROVIDER_SITE_OTHER): Payer: Self-pay

## 2017-01-22 ENCOUNTER — Ambulatory Visit (INDEPENDENT_AMBULATORY_CARE_PROVIDER_SITE_OTHER): Payer: BLUE CROSS/BLUE SHIELD | Admitting: Physician Assistant

## 2017-01-22 VITALS — Ht 68.0 in | Wt 295.0 lb

## 2017-01-22 DIAGNOSIS — M25551 Pain in right hip: Secondary | ICD-10-CM | POA: Diagnosis not present

## 2017-01-22 MED ORDER — LIDOCAINE HCL 1 % IJ SOLN
3.0000 mL | INTRAMUSCULAR | Status: AC | PRN
  Administered 2017-01-22: 3 mL

## 2017-01-22 MED ORDER — METHYLPREDNISOLONE ACETATE 40 MG/ML IJ SUSP
40.0000 mg | INTRAMUSCULAR | Status: AC | PRN
  Administered 2017-01-22: 40 mg via INTRA_ARTICULAR

## 2017-01-22 NOTE — Progress Notes (Signed)
Office Visit Note   Patient: Kelly Taylor           Date of Birth: [birth year]           MRN: [id number] Visit Date: 01/22/2017              Requested by: Thayer HeadingsBrian Mackenzie, MD [address], [address] [place], [place] [id number] PCP: Thayer HeadingsMACKENZIE,BRIAN, MD   Assessment & Plan: Visit Diagnoses:  1. Pain in right hip     Plan: Discussed with her IT band stretching exercises. She'll try to do these with her trainer at [employer]. See her back in 2 weeks check her progress lack of  Follow-Up Instructions: Return in about 2 weeks (around 02/05/2017).   Orders:  Orders Placed This Encounter  Procedures  . Large Joint Injection/Arthrocentesis  . XR HIP UNILAT W OR W/O PELVIS 2-3 VIEWS RIGHT   No orders of the defined types were placed in this encounter.     Procedures: Large Joint Inj Date/Time: 01/22/2017 9:11 AM Performed by: Kirtland BouchardLARK, GILBERT W Authorized by: Kirtland BouchardLARK, GILBERT W   Consent Given by:  Patient Indications:  Pain Location:  Hip Needle Size:  22 G Needle Length:  1.5 inches Approach:  Lateral Ultrasound Guidance: No   Fluoroscopic Guidance: No   Arthrogram: No   Medications:  40 mg methylPREDNISolone acetate 40 MG/ML; 3 mL lidocaine 1 % Aspiration Attempted: No   Patient tolerance:  Patient tolerated the procedure well with no immediate complications     Clinical Data: No additional findings.   Subjective: Chief Complaint  Patient presents with  . Right Hip - Pain    Patient presents with right hip pain x one week. She states that she has pain lateral right hip and down into the groin. If she sits too long, her leg goes numb. She has not had an injury. She is working out and swimming. She states that she has had a RFA on her back and spoken with her back doctor about this pain and he believes it is coming from her hip. She denies back pain. She is taking tramadol prn with not much relief.   She states that she's had pain for the last 2 weeks  does relate that the pain started after being on a recumbent bike at the gym. Pain does not radiate down past the thigh and is on the lateral side of the leg..  Review of Systems  Denies low back injury, radicular symptoms down the right leg. No Change in bowel or bladder function.  Objective: Vital Signs: Ht 5\' 8"  (1.727 m)   Wt 295 lb (133.8 kg)   BMI 44.85 kg/m   Physical Exam  Constitutional: She is oriented to person, place, and time. She appears well-developed and well-nourished. No distress.  Neurological: She is alert and oriented to person, place, and time.  Psychiatric: She has a normal mood and affect. Her behavior is normal.    Ortho Exam Good range of motion of both hips. Streams of internal/external rotation of the right hip causes pain lateral aspect of the hip. She has tenderness over the right trochanteric region. No tenderness over the left trochanteric region. Specialty Comments:  No specialty comments available.  Imaging: No results found.   PMFS History: Patient Active Problem List   Diagnosis Date Noted  . Arthritis of right hip 07/30/2014  . Status post total replacement of right hip 07/30/2014  . Postoperative anemia due to acute blood loss 04/08/2013  .  OA (osteoarthritis) of knee 04/07/2013   Past Medical History:  Diagnosis Date  . Anemia    hx of in 20's  . Arthritis   . Breast mass, left 2003  . Chronic back pain    seeing Pain specialist  . Complication of anesthesia    "shallow breathing with general"  . DDD (degenerative disc disease)   . Diabetes mellitus    borderline  . DJD (degenerative joint disease)   . GERD (gastroesophageal reflux disease)   . H/O scarlet fever   . Hemorrhoids   . History of chicken pox   . History of measles, mumps, or rubella as child   all 3  . Hx of bladder infections   . Hypothyroidism   . Irritable bowel syndrome   . Lumbar stenosis    RFA done May 2015  . Menopausal symptoms 2003  . Obesity,  morbid (HCC) 2006  . Pericarditis    hx of  . Pneumonia    hx of 5 years ago  . Polyp of colon    removed during colonoscopy  . Spinal headache    x1  . Trichomonas   . Urge incontinence 2005  . Vertigo    over a year was last episode  . Yeast in stool     Family History  Problem Relation Age of Onset  . Lung cancer Mother   . Stroke Paternal Grandfather   . Ovarian cancer Maternal Aunt   . Breast cancer Maternal Aunt     Past Surgical History:  Procedure Laterality Date  . ABDOMINAL HYSTERECTOMY  02/1990  . APPENDECTOMY  age 76  . BLADDER SUSPENSION    . BREAST LUMPECTOMY Left 2003  . BREAST REDUCTION SURGERY  1992  . CHOLECYSTECTOMY  2010  . COLONOSCOPY  may 2015   4 polyps-benign  . DILATION AND CURETTAGE OF UTERUS    . FOOT SURGERY Right    pinched nerve  . HAND SURGERY Right    right-Dr. Amanda Pea  . HERNIA REPAIR Right 1992  . INTERSTIM IMPLANT PLACEMENT Right 2003   for incontinence  . KNEE SURGERY     bilateral knee; x3 right/ x2 left  . ROTATOR CUFF REPAIR  2008   right, almost complete reconstruction, bone anchors  . TONSILLECTOMY  age 72  . TOTAL HIP ARTHROPLASTY Right 07/30/2014   Procedure: RIGHT TOTAL HIP ARTHROPLASTY ANTERIOR APPROACH;  Surgeon: Kathryne Hitch, MD;  Location: [facility];  Service: Orthopedics;  Laterality: Right;  . TOTAL KNEE ARTHROPLASTY Right 04/07/2013   Procedure: RIGHT TOTAL KNEE ARTHROPLASTY;  Surgeon: Loanne Drilling, MD;  Location: [facility];  Service: Orthopedics;  Laterality: Right;  . TOTAL KNEE ARTHROPLASTY Left 09/22/2013   Procedure: LEFT TOTAL KNEE ARTHROPLASTY;  Surgeon: Loanne Drilling, MD;  Location: [facility];  Service: Orthopedics;  Laterality: Left;   Social History   Occupational History  . Not on file.   Social History Main Topics  . Smoking status: Never Smoker  . Smokeless tobacco: Never Used  . Alcohol use No  . Drug use: No  . Sexual activity: Yes    Birth control/ protection: Surgical     Comment: hyst

## 2017-01-31 ENCOUNTER — Encounter (HOSPITAL_COMMUNITY): Payer: Self-pay | Admitting: *Deleted

## 2017-01-31 ENCOUNTER — Ambulatory Visit (HOSPITAL_COMMUNITY)
Admission: EM | Admit: 2017-01-31 | Discharge: 2017-01-31 | Disposition: A | Discharge status: Home / Self Care | Payer: BLUE CROSS/BLUE SHIELD | Attending: Internal Medicine | Admitting: Internal Medicine

## 2017-01-31 DIAGNOSIS — M7542 Impingement syndrome of left shoulder: Secondary | ICD-10-CM

## 2017-01-31 MED ORDER — PREDNISONE 10 MG (21) PO TBPK: ORAL_TABLET | Freq: Every day | ORAL | 0 refills | Status: DC

## 2017-01-31 NOTE — ED Triage Notes (Signed)
l  shoulder  Pain x  2  Days   denys  Any  specefic  Injury  Pain is  Worse  On movement

## 2017-01-31 NOTE — ED Provider Notes (Signed)
CSN: [id number]     Arrival date & time 01/31/17  1319 History   First MD Initiated Contact with Patient 01/31/17 1436     Chief Complaint  Patient presents with  . Shoulder Pain   (Consider location/radiation/quality/duration/timing/severity/associated sxs/prior Treatment) 66 year old female presents for evaluation of left shoulder pain. She states she woke up 2 days ago was having a significant pain in her left shoulder. States she is unable to raise her arm, it is difficult to dress herself, and difficulty moving the arm. She is left-handed, he denies any recent history of trauma, she has not fallen, she has not lifted any heavy objects, she has not had any other sources of injury. She has had no fever, redness or tenderness in the joint, no nausea, no other markers of any type of systemic illness. She reports that she's had some numbness and tingling in her arms, however no loss of function, and that this is since resolved.   The history is provided by the patient.    Past Medical History:  Diagnosis Date  . Anemia    hx of in 20's  . Arthritis   . Breast mass, left 2003  . Chronic back pain    seeing Pain specialist  . Complication of anesthesia    "shallow breathing with general"  . DDD (degenerative disc disease)   . Diabetes mellitus    borderline  . DJD (degenerative joint disease)   . GERD (gastroesophageal reflux disease)   . H/O scarlet fever   . Hemorrhoids   . History of chicken pox   . History of measles, mumps, or rubella as child   all 3  . Hx of bladder infections   . Hypothyroidism   . Irritable bowel syndrome   . Lumbar stenosis    RFA done May 2015  . Menopausal symptoms 2003  . Obesity, morbid (HCC) 2006  . Pericarditis    hx of  . Pneumonia    hx of 5 years ago  . Polyp of colon    removed during colonoscopy  . Spinal headache    x1  . Trichomonas   . Urge incontinence 2005  . Vertigo    over a year was last episode  . Yeast in stool     Past Surgical History:  Procedure Laterality Date  . ABDOMINAL HYSTERECTOMY  02/1990  . APPENDECTOMY  age 52  . BLADDER SUSPENSION    . BREAST LUMPECTOMY Left 2003  . BREAST REDUCTION SURGERY  1992  . CHOLECYSTECTOMY  2010  . COLONOSCOPY  may 2015   4 polyps-benign  . DILATION AND CURETTAGE OF UTERUS    . FOOT SURGERY Right    pinched nerve  . HAND SURGERY Right    right-Dr. Amanda Pea  . HERNIA REPAIR Right 1992  . INTERSTIM IMPLANT PLACEMENT Right 2003   for incontinence  . KNEE SURGERY     bilateral knee; x3 right/ x2 left  . ROTATOR CUFF REPAIR  2008   right, almost complete reconstruction, bone anchors  . TONSILLECTOMY  age 47  . TOTAL HIP ARTHROPLASTY Right 07/30/2014   Procedure: RIGHT TOTAL HIP ARTHROPLASTY ANTERIOR APPROACH;  Surgeon: Kathryne Hitch, MD;  Location: [facility];  Service: Orthopedics;  Laterality: Right;  . TOTAL KNEE ARTHROPLASTY Right 04/07/2013   Procedure: RIGHT TOTAL KNEE ARTHROPLASTY;  Surgeon: Loanne Drilling, MD;  Location: [facility];  Service: Orthopedics;  Laterality: Right;  . TOTAL KNEE ARTHROPLASTY Left 09/22/2013   Procedure: LEFT TOTAL  KNEE ARTHROPLASTY;  Surgeon: Loanne Drilling, MD;  Location: [facility];  Service: Orthopedics;  Laterality: Left;   Family History  Problem Relation Age of Onset  . Lung cancer Mother   . Stroke Paternal Grandfather   . Ovarian cancer Maternal Aunt   . Breast cancer Maternal Aunt    Social History  Substance Use Topics  . Smoking status: Never Smoker  . Smokeless tobacco: Never Used  . Alcohol use No   OB History    Gravida Para Term Preterm AB Living   3             SAB TAB Ectopic Multiple Live Births                 Review of Systems  Reason unable to perform ROS: As covered in history of present illness.  All other systems reviewed and are negative.   Allergies  Penicillins; Oxycodone hcl; Relafen [nabumetone]; and Sulfa antibiotics  Home Medications   Prior to Admission medications    Medication Sig Start Date End Date Taking? Authorizing Provider  albuterol (PROVENTIL HFA;VENTOLIN HFA) 108 (90 BASE) MCG/ACT inhaler Inhale 2 puffs into the lungs every 4 (four) hours as needed for wheezing or shortness of breath. 11/15/15   Tharon Aquas, PA  calcium carbonate (OS-CAL) 600 MG TABS tablet Take 1,200 mg by mouth daily with breakfast.     Historical Provider, MD  levothyroxine (SYNTHROID, LEVOTHROID) 50 MCG tablet Take 50 mcg by mouth daily before breakfast.    Historical Provider, MD  Linaclotide (LINZESS) 145 MCG CAPS capsule Take 145 mcg by mouth daily.    Historical Provider, MD  lubiprostone (AMITIZA) 24 MCG capsule Take 24 mcg by mouth 2 (two) times daily with a meal.     Historical Provider, MD  metFORMIN (GLUCOPHAGE-XR) 500 MG 24 hr tablet TAKE 1 TABLET BY MOUTH WITH EVENING MEAL EVERY DAY 01/11/17   Historical Provider, MD  Multiple Vitamins-Minerals (MULTIVITAMIN WITH MINERALS) tablet Take 1 tablet by mouth daily.    Historical Provider, MD  polyethylene glycol (MIRALAX / GLYCOLAX) packet Take 17 g by mouth daily as needed for mild constipation (for constipation).     Historical Provider, MD  predniSONE (STERAPRED UNI-PAK 21 TAB) 10 MG (21) TBPK tablet Take by mouth daily. Take 6 tabs by mouth daily  for 2 days, then 5 tabs for 2 days, then 4 tabs for 2 days, then 3 tabs for 2 days, 2 tabs for 2 days, then 1 tab by mouth daily for 2 days 01/31/17   Dorena Bodo, NP  Probiotic Product (PROBIOTIC DAILY PO) Take 1 capsule by mouth daily.    Historical Provider, MD  simvastatin (ZOCOR) 10 MG tablet TAKE 1 TABLET BY MOUTH IN THE EVENING EVERY DAY 01/11/17   Historical Provider, MD  solifenacin (VESICARE) 10 MG tablet Take 10 mg by mouth daily.     Historical Provider, MD  traMADol (ULTRAM) 50 MG tablet Take 100 mg by mouth every 6 (six) hours as needed for moderate pain.    Historical Provider, MD   Meds Ordered and Administered this Visit  Medications - No data to  display  BP 120/66 (BP Location: Right Arm)   Pulse 88   Temp 99.6 F (37.6 C) (Oral)   Resp 18  No data found.   Physical Exam  Constitutional: She is oriented to person, place, and time. She appears well-developed and well-nourished. No distress.  HENT:  Head: Normocephalic and atraumatic.  Right  Ear: External ear normal.  Left Ear: External ear normal.  Musculoskeletal:       Left shoulder: She exhibits decreased range of motion, tenderness, swelling, pain and decreased strength. She exhibits no bony tenderness, no deformity and no spasm.  Left shoulder is decreased external rotation, decreased internal rotation, abduction is limited to 30, flexion and extension are also limited, pain and tenderness when palpating at the glenohumeral joint, and the acromioclavicular joint  Neurological: She is alert and oriented to person, place, and time.  Skin: Skin is warm and dry. Capillary refill takes less than 2 seconds. No rash noted. She is not diaphoretic. No erythema.  Psychiatric: She has a normal mood and affect. Her behavior is normal.  Nursing note and vitals reviewed.   Urgent Care Course     Procedures (including critical care time)  Labs Review Labs Reviewed - No data to display  Imaging Review No results found.     MDM   1. Impingement syndrome of left shoulder     Treating for impingement of the left shoulder. Given oral steroids, immobilized shoulder in a sling. Recommend follow-up with orthopedics for further evaluation. Index of suspicion for septic joint is low.     Dorena BodoLawrence Alexandra Lipps, NP 01/31/17 [phone]

## 2017-01-31 NOTE — Discharge Instructions (Signed)
I am treating your today for impingement of your left shoulder. I prescribed a long course of prednisone. Take 6 tablets today and then reduced by one tablet every other day until finished. You may also take over-the-counter Tylenol to supplement this for pain control. I also recommend rest, ice 15 minutes at a time, 4 times a day. I also recommend you follow-up with an orthopedist for further evaluation and management

## 2017-02-05 ENCOUNTER — Ambulatory Visit (INDEPENDENT_AMBULATORY_CARE_PROVIDER_SITE_OTHER): Payer: BLUE CROSS/BLUE SHIELD | Admitting: Physician Assistant

## 2017-02-05 ENCOUNTER — Ambulatory Visit (INDEPENDENT_AMBULATORY_CARE_PROVIDER_SITE_OTHER): Payer: Self-pay

## 2017-02-05 DIAGNOSIS — M25512 Pain in left shoulder: Secondary | ICD-10-CM | POA: Diagnosis not present

## 2017-02-05 MED ORDER — METHYLPREDNISOLONE ACETATE 40 MG/ML IJ SUSP
40.0000 mg | INTRAMUSCULAR | Status: AC | PRN
  Administered 2017-02-05: 40 mg via INTRA_ARTICULAR

## 2017-02-05 MED ORDER — LIDOCAINE HCL 1 % IJ SOLN
3.0000 mL | INTRAMUSCULAR | Status: AC | PRN
  Administered 2017-02-05: 3 mL

## 2017-02-05 NOTE — Progress Notes (Signed)
Office Visit Note   Patient: Kelly Taylor           Date of Birth: Jun 11, 1951           MRN: [id number] Visit Date: 02/05/2017              Requested by: Kelly HeadingsBrian Mackenzie, MD [address], [address] [place], [place] [id number] PCP: Kelly HeadingsMACKENZIE,BRIAN, MD   Assessment & Plan: Visit Diagnoses:  1. Acute pain of left shoulder     Plan: Discussed with her wall crawls, caught him, pendulum, and for flexion exercises of the left shoulder. See her back in 2 weeks check her progress lack of. He continues would consider arthroscopy versus CT of the shoulder based on physical exam findings. She is unable to undergo an MRI due to a Iterstim stimulator that she has for incontinence.  Follow-Up Instructions: Return in about 2 weeks (around 02/19/2017).   Orders:  Orders Placed This Encounter  Procedures  . XR Shoulder Left   No orders of the defined types were placed in this encounter.     Procedures: No notes on file   Clinical Data: No additional findings.   Subjective: Chief Complaint  Patient presents with  . Right Hip - Follow-up  . Left Shoulder - Pain    HPI Kelly Taylor is well known to our service comes in today due to left shoulder pain for a week. She states she woke up with the pain couldn't raise her arm. She's been wearing a sling when out in public. She went to urgent care was told that she had pendulum of her left shoulder. She's had at least one instance where she had some numbness down her arm completely put the feels that this was due to the fact that she slept on it wrong. She's had no neck pain. No other real radicular symptoms down the left arm.  In regards to her right hip she does feel that the injection helped for the trochanteric bursitis and that the stretching exercises and felt still has some discomfort on the right lateral hip. Review of Systems See HPI  Objective: Vital Signs: There were no vitals taken for this visit.  Physical Exam    Constitutional: She is oriented to person, place, and time. She appears well-developed and well-nourished. No distress.  Neurological: She is alert and oriented to person, place, and time.  Psychiatric: She has a normal mood and affect. Her behavior is normal.    Ortho Exam Bilateral hands full motor and sensation 5 strength of external rotation, internal rotation against resistance. Initially unable to perform empty can test but after injection 5 out of 5 strength with pain with empty can test on the left. Positive impingement testing on the left. After injection I'm able to bring her left arm 280 above her head however this remains painful. She has tenderness over the left before meals joint and over the left greater tuberosity region.  Specialty Comments:  No specialty comments available.  Imaging: No results found.   PMFS History:  Patient Active Problem List   Diagnosis Date Noted  . Arthritis of right hip 07/30/2014  . Status post total replacement of right hip 07/30/2014  . Postoperative anemia due to acute blood loss 04/08/2013  . OA (osteoarthritis) of knee 04/07/2013   Past Medical History:  Diagnosis Date  . Anemia    hx of in 20's  . Arthritis   . Breast mass, left 2003  . Chronic back pain  seeing Pain specialist  . Complication of anesthesia    "shallow breathing with general"  . DDD (degenerative disc disease)   . Diabetes mellitus    borderline  . DJD (degenerative joint disease)   . GERD (gastroesophageal reflux disease)   . H/O scarlet fever   . Hemorrhoids   . History of chicken pox   . History of measles, mumps, or rubella as child   all 3  . Hx of bladder infections   . Hypothyroidism   . Irritable bowel syndrome   . Lumbar stenosis    RFA done May 2015  . Menopausal symptoms 2003  . Obesity, morbid (HCC) 2006  . Pericarditis    hx of  . Pneumonia    hx of 5 years ago  . Polyp of colon    removed during colonoscopy  . Spinal headache     x1  . Trichomonas   . Urge incontinence 2005  . Vertigo    over a year was last episode  . Yeast in stool     Family History  Problem Relation Age of Onset  . Lung cancer Mother   . Stroke Paternal Grandfather   . Ovarian cancer Maternal Aunt   . Breast cancer Maternal Aunt     Past Surgical History:  Procedure Laterality Date  . ABDOMINAL HYSTERECTOMY  02/1990  . APPENDECTOMY  age 10  . BLADDER SUSPENSION    . BREAST LUMPECTOMY Left 2003  . BREAST REDUCTION SURGERY  1992  . CHOLECYSTECTOMY  2010  . COLONOSCOPY  may 2015   4 polyps-benign  . DILATION AND CURETTAGE OF UTERUS    . FOOT SURGERY Right    pinched nerve  . HAND SURGERY Right    right-Dr. Amanda Pea  . HERNIA REPAIR Right 1992  . INTERSTIM IMPLANT PLACEMENT Right 2003   for incontinence  . KNEE SURGERY     bilateral knee; x3 right/ x2 left  . ROTATOR CUFF REPAIR  2008   right, almost complete reconstruction, bone anchors  . TONSILLECTOMY  age 78  . TOTAL HIP ARTHROPLASTY Right 07/30/2014   Procedure: RIGHT TOTAL HIP ARTHROPLASTY ANTERIOR APPROACH;  Surgeon: Kathryne Hitch, MD;  Location: [facility];  Service: Orthopedics;  Laterality: Right;  . TOTAL KNEE ARTHROPLASTY Right 04/07/2013   Procedure: RIGHT TOTAL KNEE ARTHROPLASTY;  Surgeon: Loanne Drilling, MD;  Location: [facility];  Service: Orthopedics;  Laterality: Right;  . TOTAL KNEE ARTHROPLASTY Left 09/22/2013   Procedure: LEFT TOTAL KNEE ARTHROPLASTY;  Surgeon: Loanne Drilling, MD;  Location: [facility];  Service: Orthopedics;  Laterality: Left;   Social History   Occupational History  . Not on file.   Social History Main Topics  . Smoking status: Never Smoker  . Smokeless tobacco: Never Used  . Alcohol use No  . Drug use: No  . Sexual activity: Yes    Birth control/ protection: Surgical     Comment: hyst

## 2017-02-05 NOTE — Progress Notes (Signed)
   Procedure Note  Patient: Kelly Taylor             Date of Birth: 1951-07-30           MRN: [id number]             Visit Date: 02/05/2017  Procedures: Visit Diagnoses: Acute pain of left shoulder - Plan: XR Shoulder Left, Large Joint Injection/Arthrocentesis  Large Joint Inj Date/Time: 02/05/2017 12:23 PM Performed by: Kirtland BouchardLARK, Leith Hedlund W Authorized by: Kirtland BouchardLARK, Duilio Heritage W   Consent Given by:  Patient Indications:  Pain Location:  Shoulder Site:  L subacromial bursa Needle Size:  22 G Needle Length:  1.5 inches Approach:  Lateral Ultrasound Guidance: No   Fluoroscopic Guidance: No   Arthrogram: No   Medications:  40 mg methylPREDNISolone acetate 40 MG/ML; 3 mL lidocaine 1 % Aspiration Attempted: No   Patient tolerance:  Patient tolerated the procedure well with no immediate complications

## 2017-02-06 ENCOUNTER — Telehealth (INDEPENDENT_AMBULATORY_CARE_PROVIDER_SITE_OTHER): Payer: Self-pay | Admitting: Physician Assistant

## 2017-02-06 NOTE — Telephone Encounter (Signed)
PT ASKED IF A BRUISE WAS NORMAL AFTER RECEIVING A SHOT YESTERDAY WITH GIL.    [phone]

## 2017-02-06 NOTE — Telephone Encounter (Signed)
Yes can happen

## 2017-02-06 NOTE — Telephone Encounter (Signed)
Please advise, I told her bruising was normal sometimes She said it was almost her whole shoulder large

## 2017-02-13 ENCOUNTER — Ambulatory Visit (INDEPENDENT_AMBULATORY_CARE_PROVIDER_SITE_OTHER): Payer: BLUE CROSS/BLUE SHIELD | Admitting: Physician Assistant

## 2017-02-13 DIAGNOSIS — M25512 Pain in left shoulder: Secondary | ICD-10-CM

## 2017-02-13 NOTE — Progress Notes (Signed)
Office Visit Note   Patient: Kelly Taylor           Date of Birth: 11/12/1951           MRN: [id number] Visit Date: 02/13/2017              Requested by: Thayer HeadingsBrian Mackenzie, MD [address], [address] [place], [place] [id number] PCP: Thayer HeadingsMACKENZIE,BRIAN, MD   Assessment & Plan: Visit Diagnoses:  1. Acute pain of left shoulder     Plan: We will obtain a CT scan of her left shoulder rule out rotator cuff tear. Have her follow up after the ET scan to go over the results and discuss further treatment.  Follow-Up Instructions: Return for after CT scan.   Orders:  Orders Placed This Encounter  Procedures  . CT SHOULDER LEFT WO CONTRAST   No orders of the defined types were placed in this encounter.     Procedures: No procedures performed   Clinical Data: No additional findings.   Subjective: No chief complaint on file.   HPI Kelly Taylor returns today due to the fact that she's had bruising status post shoulder injection last week. Exam considerable pain in the shoulder. She's had no fevers chills. She no she developed a bruise around the injection site and had further bruising. She is on no anticoagulants. Review of Systems   Objective: Vital Signs: There were no vitals taken for this visit.  Physical Exam Well-developed ,well-nourished female in no acute distress. Mood and affect appropriate Ortho Exam Shoulder she has significant bruising that goes down into the mid biceps area. She has tenderness with biceps mid body. Tenderness over the bicipital groove. 5 out of 5 strength with external and internal rotation against resistance both shoulders. She is pain with resistance of flexion of the left biceps no pain on the right. Slight weakness on the left compared to the right with biceps flexion against resistance. She has fluid motion of the left shoulder. There is no abnormal warmth erythema about the shoulder girdle. Tenderness over the acromioclavicular joint. Specialty  Comments:  No specialty comments available.  Imaging: No results found.   PMFS History: Patient Active Problem List   Diagnosis Date Noted  . Arthritis of right hip 07/30/2014  . Status post total replacement of right hip 07/30/2014  . Postoperative anemia due to acute blood loss 04/08/2013  . OA (osteoarthritis) of knee 04/07/2013   Past Medical History:  Diagnosis Date  . Anemia    hx of in 20's  . Arthritis   . Breast mass, left 2003  . Chronic back pain    seeing Pain specialist  . Complication of anesthesia    "shallow breathing with general"  . DDD (degenerative disc disease)   . Diabetes mellitus    borderline  . DJD (degenerative joint disease)   . GERD (gastroesophageal reflux disease)   . H/O scarlet fever   . Hemorrhoids   . History of chicken pox   . History of measles, mumps, or rubella as child   all 3  . Hx of bladder infections   . Hypothyroidism   . Irritable bowel syndrome   . Lumbar stenosis    RFA done May 2015  . Menopausal symptoms 2003  . Obesity, morbid (HCC) 2006  . Pericarditis    hx of  . Pneumonia    hx of 5 years ago  . Polyp of colon    removed during colonoscopy  . Spinal headache  x1  . Trichomonas   . Urge incontinence 2005  . Vertigo    over a year was last episode  . Yeast in stool     Family History  Problem Relation Age of Onset  . Lung cancer Mother   . Stroke Paternal Grandfather   . Ovarian cancer Maternal Aunt   . Breast cancer Maternal Aunt     Past Surgical History:  Procedure Laterality Date  . ABDOMINAL HYSTERECTOMY  02/1990  . APPENDECTOMY  age 44  . BLADDER SUSPENSION    . BREAST LUMPECTOMY Left 2003  . BREAST REDUCTION SURGERY  1992  . CHOLECYSTECTOMY  2010  . COLONOSCOPY  may 2015   4 polyps-benign  . DILATION AND CURETTAGE OF UTERUS    . FOOT SURGERY Right    pinched nerve  . HAND SURGERY Right    right-Dr. Amanda Pea  . HERNIA REPAIR Right 1992  . INTERSTIM IMPLANT PLACEMENT Right 2003    for incontinence  . KNEE SURGERY     bilateral knee; x3 right/ x2 left  . ROTATOR CUFF REPAIR  2008   right, almost complete reconstruction, bone anchors  . TONSILLECTOMY  age 64  . TOTAL HIP ARTHROPLASTY Right 07/30/2014   Procedure: RIGHT TOTAL HIP ARTHROPLASTY ANTERIOR APPROACH;  Surgeon: Kathryne Hitch, MD;  Location: [facility];  Service: Orthopedics;  Laterality: Right;  . TOTAL KNEE ARTHROPLASTY Right 04/07/2013   Procedure: RIGHT TOTAL KNEE ARTHROPLASTY;  Surgeon: Loanne Drilling, MD;  Location: [facility];  Service: Orthopedics;  Laterality: Right;  . TOTAL KNEE ARTHROPLASTY Left 09/22/2013   Procedure: LEFT TOTAL KNEE ARTHROPLASTY;  Surgeon: Loanne Drilling, MD;  Location: [facility];  Service: Orthopedics;  Laterality: Left;   Social History   Occupational History  . Not on file.   Social History Main Topics  . Smoking status: Never Smoker  . Smokeless tobacco: Never Used  . Alcohol use No  . Drug use: No  . Sexual activity: Yes    Birth control/ protection: Surgical     Comment: hyst

## 2017-02-20 ENCOUNTER — Ambulatory Visit (INDEPENDENT_AMBULATORY_CARE_PROVIDER_SITE_OTHER): Payer: Self-pay | Admitting: Physician Assistant

## 2017-02-22 ENCOUNTER — Ambulatory Visit
Admission: RE | Admit: 2017-02-22 | Discharge: 2017-02-22 | Disposition: A | Discharge status: Home / Self Care | Payer: BLUE CROSS/BLUE SHIELD | Source: Ambulatory Visit | Attending: Physician Assistant | Admitting: Physician Assistant

## 2017-02-22 DIAGNOSIS — M25512 Pain in left shoulder: Secondary | ICD-10-CM

## 2017-03-07 ENCOUNTER — Other Ambulatory Visit (INDEPENDENT_AMBULATORY_CARE_PROVIDER_SITE_OTHER): Payer: Self-pay

## 2017-03-07 ENCOUNTER — Ambulatory Visit (INDEPENDENT_AMBULATORY_CARE_PROVIDER_SITE_OTHER): Payer: BLUE CROSS/BLUE SHIELD | Admitting: Physician Assistant

## 2017-03-07 DIAGNOSIS — M7061 Trochanteric bursitis, right hip: Secondary | ICD-10-CM

## 2017-03-07 DIAGNOSIS — M19012 Primary osteoarthritis, left shoulder: Secondary | ICD-10-CM | POA: Diagnosis not present

## 2017-03-07 DIAGNOSIS — M25512 Pain in left shoulder: Principal | ICD-10-CM

## 2017-03-07 DIAGNOSIS — G8929 Other chronic pain: Secondary | ICD-10-CM

## 2017-03-07 NOTE — Progress Notes (Signed)
Office Visit Note   Patient: Kelly Taylor           Date of Birth: 06-25-1951           MRN: [id number] Visit Date: 03/07/2017              Requested by: Thayer Headings, MD [address], [address] [place], [place] [address] PCP: Thayer Headings, MD   Assessment & Plan: Visit Diagnoses:  1. Trochanteric bursitis, right hip   2. Primary osteoarthritis of left shoulder     Plan: We will send her back in 1 month to inject the right hip prior trip to [place]. She'll continue to work on IT band stretching. In regards to her left shoulder recommend referral to Dr. Ave Filter for treatment Teodoro Spray. Dr. Magnus Ivan and I both discussed the CT findings with the patient today.  Follow-Up Instructions: Return in about 4 weeks (around 04/04/2017) for Right hip trochanteric injection.   Orders:  No orders of the defined types were placed in this encounter.  No orders of the defined types were placed in this encounter.     Procedures: No procedures performed   Clinical Data: No additional findings.   Subjective: No chief complaint on file.   HPI Kelly Taylor returns today follow-up of her left shoulder status post CT scan. Continues to have decreased range of motion of the left shoulder and pain in the shoulder. She is unable to work out her upper extremities secondary to the pain and decreased range of motion She had plain films performed on 02/05/2017 for left shoulder which showed significant acromioclavicular changes. Y view show decreased subacromial space with calcific tendinosis. The axillary view was somewhat under penetrated however the glenohumeral joint appeared well maintained with some spurring of the humeral head inferiorly. However her CT scan of the left shoulder dated 02/22/2017 showed moderate to advanced glenohumeral osteoarthritis with a large osteophyte off the anterior glenoid and moderate osteophyte off the humeral head. Also the supraspinatus and subscapularis  were completely torn and retracted back to the glenoid. Results were reviewed with the patient today. She is planning a trip to [place] in May and is asking about another trochanteric injection on the right. However it has  been just over a month since her last injection. She states she's working on IT band stretching  Review of Systems   Objective: Vital Signs: There were no vitals taken for this visit.  Physical Exam  Ortho Exam  Specialty Comments:  No specialty comments available.  Imaging: No results found.   PMFS History: Patient Active Problem List   Diagnosis Date Noted  . Arthritis of right hip 07/30/2014  . Status post total replacement of right hip 07/30/2014  . Postoperative anemia due to acute blood loss 04/08/2013  . OA (osteoarthritis) of knee 04/07/2013   Past Medical History:  Diagnosis Date  . Anemia    hx of in 20's  . Arthritis   . Breast mass, left 2003  . Chronic back pain    seeing Pain specialist  . Complication of anesthesia    "shallow breathing with general"  . DDD (degenerative disc disease)   . Diabetes mellitus    borderline  . DJD (degenerative joint disease)   . GERD (gastroesophageal reflux disease)   . H/O scarlet fever   . Hemorrhoids   . History of chicken pox   . History of measles, mumps, or rubella as child   all 3  . Hx of bladder infections   .  Hypothyroidism   . Irritable bowel syndrome   . Lumbar stenosis    RFA done May 2015  . Menopausal symptoms 2003  . Obesity, morbid (HCC) 2006  . Pericarditis    hx of  . Pneumonia    hx of 5 years ago  . Polyp of colon    removed during colonoscopy  . Spinal headache    x1  . Trichomonas   . Urge incontinence 2005  . Vertigo    over a year was last episode  . Yeast in stool     Family History  Problem Relation Age of Onset  . Lung cancer Mother   . Stroke Paternal Grandfather   . Ovarian cancer Maternal Aunt   . Breast cancer Maternal Aunt     Past Surgical  History:  Procedure Laterality Date  . ABDOMINAL HYSTERECTOMY  02/1990  . APPENDECTOMY  age 41  . BLADDER SUSPENSION    . BREAST LUMPECTOMY Left 2003  . BREAST REDUCTION SURGERY  1992  . CHOLECYSTECTOMY  2010  . COLONOSCOPY  may 2015   4 polyps-benign  . DILATION AND CURETTAGE OF UTERUS    . FOOT SURGERY Right    pinched nerve  . HAND SURGERY Right    right-Dr. Amanda Pea  . HERNIA REPAIR Right 1992  . INTERSTIM IMPLANT PLACEMENT Right 2003   for incontinence  . KNEE SURGERY     bilateral knee; x3 right/ x2 left  . ROTATOR CUFF REPAIR  2008   right, almost complete reconstruction, bone anchors  . TONSILLECTOMY  age 37  . TOTAL HIP ARTHROPLASTY Right 07/30/2014   Procedure: RIGHT TOTAL HIP ARTHROPLASTY ANTERIOR APPROACH;  Surgeon: Kathryne Hitch, MD;  Location: [facility];  Service: Orthopedics;  Laterality: Right;  . TOTAL KNEE ARTHROPLASTY Right 04/07/2013   Procedure: RIGHT TOTAL KNEE ARTHROPLASTY;  Surgeon: Loanne Drilling, MD;  Location: [facility];  Service: Orthopedics;  Laterality: Right;  . TOTAL KNEE ARTHROPLASTY Left 09/22/2013   Procedure: LEFT TOTAL KNEE ARTHROPLASTY;  Surgeon: Loanne Drilling, MD;  Location: [facility];  Service: Orthopedics;  Laterality: Left;   Social History   Occupational History  . Not on file.   Social History Main Topics  . Smoking status: Never Smoker  . Smokeless tobacco: Never Used  . Alcohol use No  . Drug use: No  . Sexual activity: Yes    Birth control/ protection: Surgical     Comment: hyst

## 2017-03-12 ENCOUNTER — Telehealth (INDEPENDENT_AMBULATORY_CARE_PROVIDER_SITE_OTHER): Payer: Self-pay | Admitting: Orthopaedic Surgery

## 2017-03-12 NOTE — Telephone Encounter (Signed)
error 

## 2017-03-14 ENCOUNTER — Telehealth (INDEPENDENT_AMBULATORY_CARE_PROVIDER_SITE_OTHER): Payer: Self-pay | Admitting: *Deleted

## 2017-03-14 NOTE — Telephone Encounter (Signed)
Patient came in this afternoon in regards to needing a copy of her recent X-rays that were done on her left shoulder. CB # [phone]. Thank you

## 2017-03-14 NOTE — Telephone Encounter (Signed)
[facility] CD is ready for pick up at the front desk.

## 2017-03-22 ENCOUNTER — Other Ambulatory Visit: Payer: Self-pay | Admitting: Orthopedic Surgery

## 2017-03-28 ENCOUNTER — Ambulatory Visit (INDEPENDENT_AMBULATORY_CARE_PROVIDER_SITE_OTHER): Payer: BLUE CROSS/BLUE SHIELD | Admitting: Physician Assistant

## 2017-03-28 ENCOUNTER — Encounter (INDEPENDENT_AMBULATORY_CARE_PROVIDER_SITE_OTHER): Payer: Self-pay | Admitting: Orthopaedic Surgery

## 2017-03-28 VITALS — Ht 68.0 in | Wt 295.0 lb

## 2017-03-28 DIAGNOSIS — M7061 Trochanteric bursitis, right hip: Secondary | ICD-10-CM | POA: Insufficient documentation

## 2017-03-28 HISTORY — DX: Trochanteric bursitis, right hip: M70.61

## 2017-03-28 MED ORDER — LIDOCAINE HCL 1 % IJ SOLN
1.0000 mL | INTRAMUSCULAR | Status: AC | PRN
  Administered 2017-03-28: 1 mL

## 2017-03-28 MED ORDER — LIDOCAINE HCL 1 % IJ SOLN
3.0000 mL | INTRAMUSCULAR | Status: AC | PRN
  Administered 2017-03-28: 3 mL

## 2017-03-28 MED ORDER — METHYLPREDNISOLONE ACETATE 40 MG/ML IJ SUSP
40.0000 mg | INTRAMUSCULAR | Status: AC | PRN
  Administered 2017-03-28: 40 mg via INTRA_ARTICULAR

## 2017-03-28 NOTE — Progress Notes (Signed)
   Procedure Note  Patient: Kelly Taylor             Date of Birth: [birth year]           MRN: [id number]             Visit Date: 03/28/2017 Kelly Taylor comes in today for right hip trochanteric bursitis one another injection. She's been doing some stretching exercises and continues to have pain on the right hip. She does report that she is set up for left total shoulder arthroplasty by Dr. Ave Filterhandler on 04/19/2017. Procedures: Visit Diagnoses: Trochanteric bursitis, right hip  Large Joint Inj Date/Time: 03/28/2017 10:18 AM Performed by: Kirtland BouchardLARK, Zinedine Ellner W Authorized by: Kirtland BouchardLARK, Ernie Kasler W   Consent Given by:  Patient Indications:  Pain Location:  Hip Site:  R greater trochanter Needle Size:  22 G Needle Length:  1.5 inches and 3.5 inches Approach:  Lateral Ultrasound Guidance: No   Fluoroscopic Guidance: No   Arthrogram: No   Medications:  1 mL lidocaine 1 %; 40 mg methylPREDNISolone acetate 40 MG/ML; 3 mL lidocaine 1 % Aspiration Attempted: No   Patient tolerance:  Patient tolerated the procedure well with no immediate complications   Plan: Continue IT band stretching. Follow with us on an as-needed basis.

## 2017-04-12 ENCOUNTER — Inpatient Hospital Stay (HOSPITAL_COMMUNITY): Admission: RE | Admit: 2017-04-12 | Payer: BLUE CROSS/BLUE SHIELD | Source: Ambulatory Visit

## 2017-04-12 NOTE — Pre-Procedure Instructions (Addendum)
Kelly EndoDebra W Taylor  04/12/2017      Kelly Taylor [facility] - [place] ([place]), [place] - [id number] [place] ([place]) [place] [address] Phone: [phone] Fax: [phone]  Kelly Taylor [id number] Ginette Otto- [place], [place] - [id number] [facility] [address] [place] [address] Phone: [phone] Fax: [phone]    Your procedure is scheduled on Apr 19, 2017.  Report to [facility] Admitting at 530 AM.  Call this number if you have problems the morning of surgery:[phone]   Remember:  Do not eat food or drink liquids after midnight.  Take these medicines the morning of surgery with A SIP OF WATER albuterol inhaler (bring inhaler with you), levothyroxine (synthroid), linaclotide (linzess), omeprazole (prilosec), tolterodine (detrol LA), tramadol (ultram)-if needed for pain.  7 days prior to surgery STOP taking any Mobic (meloxicam),  Aspirin, Aleve, Naproxen, Ibuprofen, Motrin, Advil, Goody's, BC's, all herbal medications, fish oil, and all vitamins   Do not wear jewelry, make-up or nail polish.  Do not wear lotions, powders, or perfumes, or deoderant.  Do not shave 48 hours prior to surgery.  Men may shave face and neck.  Do not bring valuables to the hospital.  [facility] is not responsible for any belongings or valuables.  Contacts, dentures or bridgework may not be worn into surgery.  Leave your suitcase in the car.  After surgery it may be brought to your room.  For patients admitted to the hospital, discharge time will be determined by your treatment team.  Patients discharged the day of surgery will not be allowed to drive home.   Special instructions:   [facility]- Preparing For Surgery  Before surgery, you can play an important role. Because skin is not sterile, your skin needs to be as free of germs as possible. You can reduce the number of germs on your skin by washing with CHG (chlorahexidine  gluconate) Soap before surgery.  CHG is an antiseptic cleaner which kills germs and bonds with the skin to continue killing germs even after washing.  Please do not use if you have an allergy to CHG or antibacterial soaps. If your skin becomes reddened/irritated stop using the CHG.  Do not shave (including legs and underarms) for at least 48 hours prior to first CHG shower. It is OK to shave your face.  Please follow these instructions carefully.   1. Shower the NIGHT BEFORE SURGERY and the MORNING OF SURGERY with CHG.   2. If you chose to wash your hair, wash your hair first as usual with your normal shampoo.  3. After you shampoo, rinse your hair and body thoroughly to remove the shampoo.  4. Use CHG as you would any other liquid soap. You can apply CHG directly to the skin and wash gently with a scrungie or a clean washcloth.   5. Apply the CHG Soap to your body ONLY FROM THE NECK DOWN.  Do not use on open wounds or open sores. Avoid contact with your eyes, ears, mouth and genitals (private parts). Wash genitals (private parts) with your normal soap.  6. Wash thoroughly, paying special attention to the area where your surgery will be performed.  7. Thoroughly rinse your body with warm water from the neck down.  8. DO NOT shower/wash with your normal soap after using and rinsing off the CHG Soap.  9. Pat yourself dry with a CLEAN TOWEL.   10. Wear CLEAN PAJAMAS  11. Place CLEAN SHEETS on your bed the night of your first shower and DO NOT SLEEP WITH PETS.  Day of Surgery: Do not apply any deodorants/lotions. Please wear clean clothes to the hospital/surgery center.       How to Manage Your Diabetes Before and After Surgery  Why is it important to control my blood sugar before and after surgery? . Improving blood sugar levels before and after surgery helps healing and can limit problems. . A way of improving blood sugar control is eating a healthy diet by: o  Eating less  sugar and carbohydrates o  Increasing activity/exercise o  Talking with your doctor about reaching your blood sugar goals . High blood sugars (greater than 180 mg/dL) can raise your risk of infections and slow your recovery, so you will need to focus on controlling your diabetes during the weeks before surgery. . Make sure that the doctor who takes care of your diabetes knows about your planned surgery including the date and location.  How do I manage my blood sugar before surgery? . Check your blood sugar at least 4 times a day, starting 2 days before surgery, to make sure that the level is not too high or low. o Check your blood sugar the morning of your surgery when you wake up and every 2 hours until you get to the Short Stay unit. . If your blood sugar is less than 70 mg/dL, you will need to treat for low blood sugar: o Do not take insulin. o Treat a low blood sugar (less than 70 mg/dL) with  cup of clear juice (cranberry or apple), 4 glucose tablets, OR glucose gel.   o Recheck blood sugar in 15 minutes after treatment (to make sure it is greater than 70 mg/dL). If your blood sugar is not greater than 70 mg/dL on recheck, call [phone] for further instructions. . Report your blood sugar to the short stay nurse when you get to Short Stay.  . If you are admitted to the hospital after surgery: o Your blood sugar will be checked by the staff and you will probably be given insulin after surgery (instead of oral diabetes medicines) to make sure you have good blood sugar levels. o The goal for blood sugar control after surgery is 80-180 mg/dL.      WHAT DO I DO ABOUT MY DIABETES MEDICATION?   Marland Kitchen Do not take oral diabetes medicines (pills) the morning of surgery.     Reviewed and Endorsed by [facility] Patient Education Committee, August 2015 Please read over the following fact sheets that you were given. Pain Booklet, Coughing and Deep Breathing, MRSA Information and Surgical  Site Infection Prevention

## 2017-04-13 ENCOUNTER — Ambulatory Visit (HOSPITAL_COMMUNITY)
Admission: RE | Admit: 2017-04-13 | Discharge: 2017-04-13 | Disposition: A | Discharge status: Home / Self Care | Payer: BLUE CROSS/BLUE SHIELD | Source: Ambulatory Visit | Attending: Orthopedic Surgery | Admitting: Orthopedic Surgery

## 2017-04-13 ENCOUNTER — Encounter (HOSPITAL_COMMUNITY): Payer: Self-pay

## 2017-04-13 ENCOUNTER — Encounter (HOSPITAL_COMMUNITY)
Admission: RE | Admit: 2017-04-13 | Discharge: 2017-04-13 | Disposition: A | Discharge status: Home / Self Care | Payer: BLUE CROSS/BLUE SHIELD | Source: Ambulatory Visit | Attending: Orthopedic Surgery | Admitting: Orthopedic Surgery

## 2017-04-13 DIAGNOSIS — M75102 Unspecified rotator cuff tear or rupture of left shoulder, not specified as traumatic: Secondary | ICD-10-CM | POA: Insufficient documentation

## 2017-04-13 DIAGNOSIS — R918 Other nonspecific abnormal finding of lung field: Secondary | ICD-10-CM | POA: Diagnosis not present

## 2017-04-13 DIAGNOSIS — M12812 Other specific arthropathies, not elsewhere classified, left shoulder: Secondary | ICD-10-CM | POA: Diagnosis not present

## 2017-04-13 DIAGNOSIS — Z01818 Encounter for other preprocedural examination: Secondary | ICD-10-CM | POA: Insufficient documentation

## 2017-04-13 DIAGNOSIS — Z01812 Encounter for preprocedural laboratory examination: Secondary | ICD-10-CM | POA: Diagnosis not present

## 2017-04-13 HISTORY — DX: Unspecified asthma, uncomplicated: J45.909

## 2017-04-13 LAB — GLUCOSE, CAPILLARY: GLUCOSE-CAPILLARY: 131 mg/dL — AB (ref 65–99)

## 2017-04-13 LAB — CBC
HCT: 42.1 % (ref 36.0–46.0)
Hemoglobin: 13 g/dL (ref 12.0–15.0)
MCH: 28 pg (ref 26.0–34.0)
MCHC: 30.9 g/dL (ref 30.0–36.0)
MCV: 90.5 fL (ref 78.0–100.0)
PLATELETS: 244 10*3/uL (ref 150–400)
RBC: 4.65 MIL/uL (ref 3.87–5.11)
RDW: 15.4 % (ref 11.5–15.5)
WBC: 10 10*3/uL (ref 4.0–10.5)

## 2017-04-13 LAB — BASIC METABOLIC PANEL
ANION GAP: 7 (ref 5–15)
BUN: 15 mg/dL (ref 6–20)
CO2: 27 mmol/L (ref 22–32)
CREATININE: 0.66 mg/dL (ref 0.44–1.00)
Calcium: 9.3 mg/dL (ref 8.9–10.3)
Chloride: 105 mmol/L (ref 101–111)
Glucose, Bld: 144 mg/dL — ABNORMAL HIGH (ref 65–99)
Potassium: 4.2 mmol/L (ref 3.5–5.1)
SODIUM: 139 mmol/L (ref 135–145)

## 2017-04-13 LAB — URINALYSIS, ROUTINE W REFLEX MICROSCOPIC
BILIRUBIN URINE: NEGATIVE
GLUCOSE, UA: NEGATIVE mg/dL
HGB URINE DIPSTICK: NEGATIVE
Ketones, ur: NEGATIVE mg/dL
Leukocytes, UA: NEGATIVE
Nitrite: NEGATIVE
PROTEIN: NEGATIVE mg/dL
Specific Gravity, Urine: 1.02 (ref 1.005–1.030)
pH: 5 (ref 5.0–8.0)

## 2017-04-13 LAB — APTT: APTT: 26 s (ref 24–36)

## 2017-04-13 LAB — PROTIME-INR
INR: 0.94
Prothrombin Time: 12.6 seconds (ref 11.4–15.2)

## 2017-04-13 LAB — SURGICAL PCR SCREEN
MRSA, PCR: NEGATIVE
STAPHYLOCOCCUS AUREUS: NEGATIVE

## 2017-04-13 NOTE — Progress Notes (Addendum)
[id number]:Kelly Taylor, Kelly JohnBrian, MD  Cardiologist: pt denies  EKG: denies past year, done today  Stress test: many years ago, 5+ years  ECHO: over 5 years ago  Cardiac Cath: denies ever  Chest x-ray: denies past year, done today

## 2017-04-18 MED ORDER — VANCOMYCIN HCL 10 G IV SOLR
1500.0000 mg | INTRAVENOUS | Status: DC
  Filled 2017-04-18 (×2): qty 1500

## 2017-04-19 ENCOUNTER — Encounter (HOSPITAL_COMMUNITY): Payer: Self-pay

## 2017-04-19 ENCOUNTER — Inpatient Hospital Stay (HOSPITAL_COMMUNITY): Payer: BLUE CROSS/BLUE SHIELD

## 2017-04-19 ENCOUNTER — Inpatient Hospital Stay (HOSPITAL_COMMUNITY): Payer: BLUE CROSS/BLUE SHIELD | Admitting: Certified Registered Nurse Anesthetist

## 2017-04-19 ENCOUNTER — Inpatient Hospital Stay (HOSPITAL_COMMUNITY)
Admission: RE | Admit: 2017-04-19 | Discharge: 2017-04-20 | DRG: 483 | Disposition: A | Discharge status: Home / Self Care | Payer: BLUE CROSS/BLUE SHIELD | Source: Ambulatory Visit | Attending: Orthopedic Surgery | Admitting: Orthopedic Surgery

## 2017-04-19 ENCOUNTER — Encounter (HOSPITAL_COMMUNITY)
Admission: RE | Disposition: A | Discharge status: Home / Self Care | Payer: Self-pay | Source: Ambulatory Visit | Attending: Orthopedic Surgery

## 2017-04-19 DIAGNOSIS — J45909 Unspecified asthma, uncomplicated: Secondary | ICD-10-CM | POA: Diagnosis present

## 2017-04-19 DIAGNOSIS — Z882 Allergy status to sulfonamides status: Secondary | ICD-10-CM | POA: Diagnosis not present

## 2017-04-19 DIAGNOSIS — K219 Gastro-esophageal reflux disease without esophagitis: Secondary | ICD-10-CM | POA: Diagnosis present

## 2017-04-19 DIAGNOSIS — M549 Dorsalgia, unspecified: Secondary | ICD-10-CM | POA: Diagnosis present

## 2017-04-19 DIAGNOSIS — Z888 Allergy status to other drugs, medicaments and biological substances status: Secondary | ICD-10-CM

## 2017-04-19 DIAGNOSIS — Z79899 Other long term (current) drug therapy: Secondary | ICD-10-CM

## 2017-04-19 DIAGNOSIS — M19012 Primary osteoarthritis, left shoulder: Secondary | ICD-10-CM | POA: Diagnosis present

## 2017-04-19 DIAGNOSIS — Z96652 Presence of left artificial knee joint: Secondary | ICD-10-CM | POA: Diagnosis present

## 2017-04-19 DIAGNOSIS — Z885 Allergy status to narcotic agent status: Secondary | ICD-10-CM

## 2017-04-19 DIAGNOSIS — Z96641 Presence of right artificial hip joint: Secondary | ICD-10-CM | POA: Diagnosis present

## 2017-04-19 DIAGNOSIS — E039 Hypothyroidism, unspecified: Secondary | ICD-10-CM | POA: Diagnosis present

## 2017-04-19 DIAGNOSIS — Z880 Allergy status to penicillin: Secondary | ICD-10-CM

## 2017-04-19 DIAGNOSIS — M75102 Unspecified rotator cuff tear or rupture of left shoulder, not specified as traumatic: Secondary | ICD-10-CM | POA: Diagnosis present

## 2017-04-19 DIAGNOSIS — G8929 Other chronic pain: Secondary | ICD-10-CM | POA: Diagnosis present

## 2017-04-19 DIAGNOSIS — Z96612 Presence of left artificial shoulder joint: Secondary | ICD-10-CM

## 2017-04-19 DIAGNOSIS — Z6841 Body Mass Index (BMI) 40.0 and over, adult: Secondary | ICD-10-CM | POA: Diagnosis not present

## 2017-04-19 HISTORY — PX: REVERSE SHOULDER ARTHROPLASTY: SHX5054

## 2017-04-19 HISTORY — DX: Presence of left artificial shoulder joint: Z96.612

## 2017-04-19 LAB — GLUCOSE, CAPILLARY
GLUCOSE-CAPILLARY: 160 mg/dL — AB (ref 65–99)
Glucose-Capillary: 130 mg/dL — ABNORMAL HIGH (ref 65–99)
Glucose-Capillary: 135 mg/dL — ABNORMAL HIGH (ref 65–99)
Glucose-Capillary: 149 mg/dL — ABNORMAL HIGH (ref 65–99)
Glucose-Capillary: 128 mg/dL — ABNORMAL HIGH (ref 65–99)

## 2017-04-19 SURGERY — ARTHROPLASTY, SHOULDER, TOTAL, REVERSE
Anesthesia: Regional | Site: Shoulder | Laterality: Left

## 2017-04-19 MED ORDER — CLINDAMYCIN PHOSPHATE 600 MG/50ML IV SOLN
600.0000 mg | Freq: Four times a day (QID) | INTRAVENOUS | Status: AC
  Administered 2017-04-19 – 2017-04-20 (×3): 600 mg via INTRAVENOUS
  Filled 2017-04-19 (×3): qty 50

## 2017-04-19 MED ORDER — MENTHOL 3 MG MT LOZG: 1.0000 | LOZENGE | OROMUCOSAL | Status: DC | PRN

## 2017-04-19 MED ORDER — HYDROCODONE-ACETAMINOPHEN 5-325 MG PO TABS
1.0000 | ORAL_TABLET | ORAL | Status: DC | PRN
  Administered 2017-04-19 – 2017-04-20 (×5): 2 via ORAL
  Filled 2017-04-19 (×5): qty 2

## 2017-04-19 MED ORDER — MIDAZOLAM HCL 2 MG/2ML IJ SOLN
INTRAMUSCULAR | Status: AC
  Filled 2017-04-19: qty 2

## 2017-04-19 MED ORDER — DIPHENHYDRAMINE HCL 50 MG/ML IJ SOLN
INTRAMUSCULAR | Status: DC | PRN
  Administered 2017-04-19: 25 mg via INTRAVENOUS

## 2017-04-19 MED ORDER — FENTANYL CITRATE (PF) 250 MCG/5ML IJ SOLN
INTRAMUSCULAR | Status: AC
  Filled 2017-04-19: qty 5

## 2017-04-19 MED ORDER — PROPOFOL 10 MG/ML IV BOLUS
INTRAVENOUS | Status: DC | PRN
  Administered 2017-04-19: 200 mg via INTRAVENOUS

## 2017-04-19 MED ORDER — POLYETHYLENE GLYCOL 3350 17 G PO PACK: 17.0000 g | PACK | Freq: Every day | ORAL | Status: DC | PRN

## 2017-04-19 MED ORDER — SODIUM CHLORIDE 0.9 % IR SOLN
Status: DC | PRN
  Administered 2017-04-19: 1000 mL

## 2017-04-19 MED ORDER — PROMETHAZINE HCL 25 MG/ML IJ SOLN: 6.2500 mg | INTRAMUSCULAR | Status: DC | PRN

## 2017-04-19 MED ORDER — VANCOMYCIN HCL 1000 MG IV SOLR
INTRAVENOUS | Status: DC | PRN
  Administered 2017-04-19: 1500 mg via INTRAVENOUS

## 2017-04-19 MED ORDER — MEPERIDINE HCL 25 MG/ML IJ SOLN: 6.2500 mg | INTRAMUSCULAR | Status: DC | PRN

## 2017-04-19 MED ORDER — METHOCARBAMOL 1000 MG/10ML IJ SOLN
500.0000 mg | Freq: Four times a day (QID) | INTRAVENOUS | Status: DC | PRN
  Filled 2017-04-19: qty 5

## 2017-04-19 MED ORDER — LACTATED RINGERS IV SOLN
INTRAVENOUS | Status: DC | PRN
  Administered 2017-04-19: 07:00:00 via INTRAVENOUS

## 2017-04-19 MED ORDER — FLEET ENEMA 7-19 GM/118ML RE ENEM: 1.0000 | ENEMA | Freq: Once | RECTAL | Status: DC | PRN

## 2017-04-19 MED ORDER — TRANEXAMIC ACID 1000 MG/10ML IV SOLN
1000.0000 mg | INTRAVENOUS | Status: AC
  Administered 2017-04-19: 1000 mg via INTRAVENOUS
  Filled 2017-04-19: qty 10

## 2017-04-19 MED ORDER — LEVOTHYROXINE SODIUM 50 MCG PO TABS
50.0000 ug | ORAL_TABLET | Freq: Every day | ORAL | Status: DC
  Administered 2017-04-20: 50 ug via ORAL
  Filled 2017-04-19: qty 1

## 2017-04-19 MED ORDER — ALBUTEROL SULFATE (2.5 MG/3ML) 0.083% IN NEBU: 2.5000 mg | INHALATION_SOLUTION | RESPIRATORY_TRACT | Status: DC | PRN

## 2017-04-19 MED ORDER — METOCLOPRAMIDE HCL 5 MG/ML IJ SOLN: 5.0000 mg | Freq: Three times a day (TID) | INTRAMUSCULAR | Status: DC | PRN

## 2017-04-19 MED ORDER — ONDANSETRON HCL 4 MG/2ML IJ SOLN
INTRAMUSCULAR | Status: DC | PRN
  Administered 2017-04-19: 4 mg via INTRAVENOUS

## 2017-04-19 MED ORDER — METHOCARBAMOL 500 MG PO TABS
500.0000 mg | ORAL_TABLET | Freq: Four times a day (QID) | ORAL | Status: DC | PRN
Start: 2017-04-19 — End: 2017-04-20
  Administered 2017-04-19: 500 mg via ORAL
  Filled 2017-04-19: qty 1

## 2017-04-19 MED ORDER — PROPOFOL 10 MG/ML IV BOLUS
INTRAVENOUS | Status: AC
  Filled 2017-04-19: qty 40

## 2017-04-19 MED ORDER — BUPIVACAINE LIPOSOME 1.3 % IJ SUSP
20.0000 mL | INTRAMUSCULAR | Status: AC
  Administered 2017-04-19: 20 mL
  Filled 2017-04-19: qty 20

## 2017-04-19 MED ORDER — ALUM & MAG HYDROXIDE-SIMETH 200-200-20 MG/5ML PO SUSP
30.0000 mL | ORAL | Status: DC | PRN
Start: 2017-04-19 — End: 2017-04-20

## 2017-04-19 MED ORDER — METFORMIN HCL ER 500 MG PO TB24
500.0000 mg | ORAL_TABLET | Freq: Every day | ORAL | Status: DC
  Filled 2017-04-19: qty 1

## 2017-04-19 MED ORDER — ONDANSETRON HCL 4 MG PO TABS: 4.0000 mg | ORAL_TABLET | Freq: Four times a day (QID) | ORAL | Status: DC | PRN

## 2017-04-19 MED ORDER — ROCURONIUM BROMIDE 10 MG/ML (PF) SYRINGE
PREFILLED_SYRINGE | INTRAVENOUS | Status: AC
  Filled 2017-04-19: qty 5

## 2017-04-19 MED ORDER — LIDOCAINE 2% (20 MG/ML) 5 ML SYRINGE
INTRAMUSCULAR | Status: AC
  Filled 2017-04-19: qty 5

## 2017-04-19 MED ORDER — FESOTERODINE FUMARATE ER 8 MG PO TB24
8.0000 mg | ORAL_TABLET | Freq: Every day | ORAL | Status: DC
  Administered 2017-04-19 – 2017-04-20 (×2): 8 mg via ORAL
  Filled 2017-04-19 (×2): qty 1

## 2017-04-19 MED ORDER — PHENYLEPHRINE HCL 10 MG/ML IJ SOLN
INTRAMUSCULAR | Status: DC | PRN
  Administered 2017-04-19: 40 ug/min via INTRAVENOUS

## 2017-04-19 MED ORDER — PHENOL 1.4 % MT LIQD: 1.0000 | OROMUCOSAL | Status: DC | PRN

## 2017-04-19 MED ORDER — LIDOCAINE 2% (20 MG/ML) 5 ML SYRINGE
INTRAMUSCULAR | Status: DC | PRN
  Administered 2017-04-19: 80 mg via INTRAVENOUS

## 2017-04-19 MED ORDER — ROCURONIUM BROMIDE 100 MG/10ML IV SOLN
INTRAVENOUS | Status: DC | PRN
  Administered 2017-04-19: 10 mg via INTRAVENOUS
  Administered 2017-04-19: 50 mg via INTRAVENOUS

## 2017-04-19 MED ORDER — SIMVASTATIN 10 MG PO TABS
10.0000 mg | ORAL_TABLET | Freq: Every day | ORAL | Status: DC
  Administered 2017-04-19: 10 mg via ORAL
  Filled 2017-04-19 (×2): qty 1

## 2017-04-19 MED ORDER — ONDANSETRON HCL 4 MG/2ML IJ SOLN: 4.0000 mg | Freq: Four times a day (QID) | INTRAMUSCULAR | Status: DC | PRN

## 2017-04-19 MED ORDER — HYDROMORPHONE HCL 1 MG/ML IJ SOLN: 0.2500 mg | INTRAMUSCULAR | Status: DC | PRN

## 2017-04-19 MED ORDER — METOCLOPRAMIDE HCL 5 MG PO TABS: 5.0000 mg | ORAL_TABLET | Freq: Three times a day (TID) | ORAL | Status: DC | PRN

## 2017-04-19 MED ORDER — PHENYLEPHRINE 40 MCG/ML (10ML) SYRINGE FOR IV PUSH (FOR BLOOD PRESSURE SUPPORT)
PREFILLED_SYRINGE | INTRAVENOUS | Status: DC | PRN
  Administered 2017-04-19: 160 ug via INTRAVENOUS

## 2017-04-19 MED ORDER — PANTOPRAZOLE SODIUM 40 MG PO TBEC
40.0000 mg | DELAYED_RELEASE_TABLET | Freq: Every day | ORAL | Status: DC
  Administered 2017-04-19 – 2017-04-20 (×2): 40 mg via ORAL
  Filled 2017-04-19 (×2): qty 1

## 2017-04-19 MED ORDER — BISACODYL 5 MG PO TBEC: 5.0000 mg | DELAYED_RELEASE_TABLET | Freq: Every day | ORAL | Status: DC | PRN

## 2017-04-19 MED ORDER — SCOPOLAMINE 1 MG/3DAYS TD PT72
MEDICATED_PATCH | TRANSDERMAL | Status: DC | PRN
  Administered 2017-04-19: 1 via TRANSDERMAL

## 2017-04-19 MED ORDER — SUCCINYLCHOLINE CHLORIDE 200 MG/10ML IV SOSY
PREFILLED_SYRINGE | INTRAVENOUS | Status: DC | PRN
  Administered 2017-04-19: 140 mg via INTRAVENOUS

## 2017-04-19 MED ORDER — FENTANYL CITRATE (PF) 100 MCG/2ML IJ SOLN
INTRAMUSCULAR | Status: DC | PRN
  Administered 2017-04-19: 100 ug via INTRAVENOUS
  Administered 2017-04-19: 50 ug via INTRAVENOUS
  Administered 2017-04-19: 100 ug via INTRAVENOUS

## 2017-04-19 MED ORDER — SODIUM CHLORIDE 0.9% FLUSH
INTRAVENOUS | Status: DC | PRN
  Administered 2017-04-19: 40 mL

## 2017-04-19 MED ORDER — SUGAMMADEX SODIUM 200 MG/2ML IV SOLN
INTRAVENOUS | Status: DC | PRN
  Administered 2017-04-19: 290 mg via INTRAVENOUS

## 2017-04-19 MED ORDER — SODIUM CHLORIDE 0.9 % IV SOLN
INTRAVENOUS | Status: DC
  Administered 2017-04-19: 13:00:00 via INTRAVENOUS

## 2017-04-19 MED ORDER — LINACLOTIDE 145 MCG PO CAPS
145.0000 ug | ORAL_CAPSULE | Freq: Every day | ORAL | Status: DC
  Administered 2017-04-19: 145 ug via ORAL
  Filled 2017-04-19 (×2): qty 1

## 2017-04-19 MED ORDER — MORPHINE SULFATE (PF) 4 MG/ML IV SOLN: 1.0000 mg | INTRAVENOUS | Status: DC | PRN

## 2017-04-19 MED ORDER — ZOLPIDEM TARTRATE 5 MG PO TABS: 5.0000 mg | ORAL_TABLET | Freq: Every evening | ORAL | Status: DC | PRN

## 2017-04-19 MED ORDER — DOCUSATE SODIUM 100 MG PO CAPS
100.0000 mg | ORAL_CAPSULE | Freq: Two times a day (BID) | ORAL | Status: DC
  Administered 2017-04-19 – 2017-04-20 (×2): 100 mg via ORAL
  Filled 2017-04-19 (×2): qty 1

## 2017-04-19 MED ORDER — INSULIN ASPART 100 UNIT/ML ~~LOC~~ SOLN
0.0000 [IU] | Freq: Three times a day (TID) | SUBCUTANEOUS | Status: DC
  Administered 2017-04-19: 3 [IU] via SUBCUTANEOUS
  Administered 2017-04-19 – 2017-04-20 (×2): 2 [IU] via SUBCUTANEOUS

## 2017-04-19 MED ORDER — ASPIRIN EC 325 MG PO TBEC
325.0000 mg | DELAYED_RELEASE_TABLET | Freq: Every day | ORAL | Status: DC
  Administered 2017-04-19 – 2017-04-20 (×2): 325 mg via ORAL
  Filled 2017-04-19 (×2): qty 1

## 2017-04-19 MED ORDER — DIPHENHYDRAMINE HCL 12.5 MG/5ML PO ELIX: 12.5000 mg | ORAL_SOLUTION | ORAL | Status: DC | PRN

## 2017-04-19 MED ORDER — ROPIVACAINE HCL 5 MG/ML IJ SOLN
INTRAMUSCULAR | Status: DC | PRN
  Administered 2017-04-19: 30 mL via PERINEURAL

## 2017-04-19 MED ORDER — MIDAZOLAM HCL 2 MG/2ML IJ SOLN
INTRAMUSCULAR | Status: DC | PRN
  Administered 2017-04-19: 2 mg via INTRAVENOUS

## 2017-04-19 SURGICAL SUPPLY — 83 items
AID PSTN UNV HD RSTRNT DISP (MISCELLANEOUS) ×1
BASEPLATE P2 COATD GLND 6.5X30 (Shoulder) ×1 IMPLANT
BIT DRILL 5/64X5 DISP (BIT) IMPLANT
BLADE SAW SAG 73X25 THK (BLADE) ×2
BLADE SAW SGTL 73X25 THK (BLADE) ×1 IMPLANT
BLADE SURG 15 STRL LF DISP TIS (BLADE) IMPLANT
BLADE SURG 15 STRL SS (BLADE)
BOWL SMART MIX CTS (DISPOSABLE) IMPLANT
BSPLAT GLND 30 STRL LF SHLDR (Shoulder) ×1 IMPLANT
CHLORAPREP W/TINT 26ML (MISCELLANEOUS) ×3 IMPLANT
CLOSURE STERI-STRIP 1/2X4 (GAUZE/BANDAGES/DRESSINGS) ×1
CLOSURE WOUND 1/2 X4 (GAUZE/BANDAGES/DRESSINGS) ×1
CLSR STERI-STRIP ANTIMIC 1/2X4 (GAUZE/BANDAGES/DRESSINGS) ×2 IMPLANT
COVER MAYO STAND STRL (DRAPES) IMPLANT
COVER SURGICAL LIGHT HANDLE (MISCELLANEOUS) ×3 IMPLANT
DRAPE INCISE IOBAN 66X45 STRL (DRAPES) ×3 IMPLANT
DRAPE ORTHO SPLIT 77X108 STRL (DRAPES) ×6
DRAPE SURG ORHT 6 SPLT 77X108 (DRAPES) ×2 IMPLANT
DRSG AQUACEL AG ADV 3.5X10 (GAUZE/BANDAGES/DRESSINGS) ×3 IMPLANT
ELECT BLADE 4.0 EZ CLEAN MEGAD (MISCELLANEOUS) ×3
ELECT REM PT RETURN 9FT ADLT (ELECTROSURGICAL) ×3
ELECTRODE BLDE 4.0 EZ CLN MEGD (MISCELLANEOUS) ×1 IMPLANT
ELECTRODE REM PT RTRN 9FT ADLT (ELECTROSURGICAL) ×1 IMPLANT
EVACUATOR 1/8 PVC DRAIN (DRAIN) IMPLANT
GLOVE BIO SURGEON STRL SZ7 (GLOVE) ×3 IMPLANT
GLOVE BIO SURGEON STRL SZ7.5 (GLOVE) ×3 IMPLANT
GLOVE BIOGEL PI IND STRL 7.0 (GLOVE) ×1 IMPLANT
GLOVE BIOGEL PI IND STRL 8 (GLOVE) ×1 IMPLANT
GLOVE BIOGEL PI INDICATOR 7.0 (GLOVE) ×2
GLOVE BIOGEL PI INDICATOR 8 (GLOVE) ×2
GOWN STRL REUS W/ TWL LRG LVL3 (GOWN DISPOSABLE) ×3 IMPLANT
GOWN STRL REUS W/ TWL XL LVL3 (GOWN DISPOSABLE) ×1 IMPLANT
GOWN STRL REUS W/TWL LRG LVL3 (GOWN DISPOSABLE) ×6
GOWN STRL REUS W/TWL XL LVL3 (GOWN DISPOSABLE) ×2
HANDPIECE INTERPULSE COAX TIP (DISPOSABLE) ×3
HOOD PEEL AWAY FLYTE STAYCOOL (MISCELLANEOUS) ×9 IMPLANT
INSERT SMALL SOCKET 32MM (Insert) ×3 IMPLANT
KIT BASIN OR (CUSTOM PROCEDURE TRAY) ×3 IMPLANT
KIT ROOM TURNOVER OR (KITS) ×3 IMPLANT
MANIFOLD NEPTUNE II (INSTRUMENTS) ×3 IMPLANT
NEEDLE HYPO 25GX1X1/2 BEV (NEEDLE) IMPLANT
NEEDLE MAYO TROCAR (NEEDLE) ×3 IMPLANT
NEEDLE SPNL 18GX3.5 QUINCKE PK (NEEDLE) ×3 IMPLANT
NEEDLE SPNL 22GX3.5 QUINCKE BK (NEEDLE) ×3 IMPLANT
NOZZLE PRISM 8.5MM (MISCELLANEOUS) IMPLANT
NS IRRIG 1000ML POUR BTL (IV SOLUTION) ×3 IMPLANT
P2 COATDE GLNOID BSEPLT 6.5X30 (Shoulder) ×3 IMPLANT
PACK SHOULDER (CUSTOM PROCEDURE TRAY) ×3 IMPLANT
PAD ARMBOARD 7.5X6 YLW CONV (MISCELLANEOUS) ×6 IMPLANT
RESTRAINT HEAD UNIVERSAL NS (MISCELLANEOUS) ×3 IMPLANT
RETRIEVER SUT HEWSON (MISCELLANEOUS) IMPLANT
SCREW BONE LOCKING RSP 5.0X14 (Screw) ×3 IMPLANT
SCREW BONE LOCKING RSP 5.0X30 (Screw) ×3 IMPLANT
SCREW BONE RSP LOCK 5X14 (Screw) ×1 IMPLANT
SCREW BONE RSP LOCK 5X22 (Screw) ×2 IMPLANT
SCREW BONE RSP LOCK 5X30 (Screw) ×1 IMPLANT
SCREW BONE RSP LOCKING 5.0X32 (Screw) ×6 IMPLANT
SCREW RETAIN W/HEAD 4MM OFFSET (Shoulder) ×3 IMPLANT
SET HNDPC FAN SPRY TIP SCT (DISPOSABLE) ×1 IMPLANT
SLING ARM FOAM STRAP LRG (SOFTGOODS) ×3 IMPLANT
SLING ARM FOAM STRAP MED (SOFTGOODS) IMPLANT
SPONGE LAP 18X18 X RAY DECT (DISPOSABLE) ×3 IMPLANT
SPONGE LAP 4X18 X RAY DECT (DISPOSABLE) ×3 IMPLANT
STEM HUMERAL REV SHL 6X108 SM (Stem) ×3 IMPLANT
STRIP CLOSURE SKIN 1/2X4 (GAUZE/BANDAGES/DRESSINGS) ×2 IMPLANT
SUCTION FRAZIER HANDLE 10FR (MISCELLANEOUS) ×2
SUCTION TUBE FRAZIER 10FR DISP (MISCELLANEOUS) ×1 IMPLANT
SUPPORT WRAP ARM LG (MISCELLANEOUS) ×3 IMPLANT
SUT ETHIBOND NAB CT1 #1 30IN (SUTURE) ×3 IMPLANT
SUT FIBERWIRE #2 38 T-5 BLUE (SUTURE) ×3
SUT MNCRL AB 4-0 PS2 18 (SUTURE) ×3 IMPLANT
SUT SILK 2 0 TIES 17X18 (SUTURE)
SUT SILK 2-0 18XBRD TIE BLK (SUTURE) IMPLANT
SUT VIC AB 2-0 CT1 27 (SUTURE) ×2
SUT VIC AB 2-0 CT1 TAPERPNT 27 (SUTURE) ×1 IMPLANT
SUTURE FIBERWR #2 38 T-5 BLUE (SUTURE) ×1 IMPLANT
SYR 30ML LL (SYRINGE) ×3 IMPLANT
SYRINGE TOOMEY DISP (SYRINGE) IMPLANT
TAPE FIBER 2MM 7IN #2 BLUE (SUTURE) ×3 IMPLANT
TOWEL OR 17X24 6PK STRL BLUE (TOWEL DISPOSABLE) ×3 IMPLANT
TOWEL OR 17X26 10 PK STRL BLUE (TOWEL DISPOSABLE) ×3 IMPLANT
WATER STERILE IRR 1000ML POUR (IV SOLUTION) ×3 IMPLANT
YANKAUER SUCT BULB TIP NO VENT (SUCTIONS) ×3 IMPLANT

## 2017-04-19 NOTE — Progress Notes (Signed)
Assuming care of patient after received from PACU, report given by Aneta MinsPhillip, RN-PACU

## 2017-04-19 NOTE — Anesthesia Procedure Notes (Signed)
Anesthesia Regional Block: Interscalene brachial plexus block   Pre-Anesthetic Checklist: ,, timeout performed, Correct Patient, Correct Site, Correct Laterality, Correct Procedure, Correct Position, site marked, Risks and benefits discussed,  Surgical consent,  Pre-op evaluation,  At surgeon's request and post-op pain management  Laterality: Left  Prep: Dura Prep       Needles:  Injection technique: Single-shot  Needle Type: Stimiplex     Needle Length: 9cm  Needle Gauge: 21     Additional Needles:   Procedures: ultrasound guided,,,,,,,,  Narrative:  Start time: 04/19/2017 7:18 AM End time: 04/19/2017 7:27 AM Injection made incrementally with aspirations every 5 mL.  Performed by: Personally  Anesthesiologist: Anitra LauthMILLER, Jendaya Gossett RAY

## 2017-04-19 NOTE — H&P (Signed)
Kelly Taylor is an 66 y.o. female.   Chief Complaint: L shoulder pain and dsyfunction HPI: Endstage L shoulder rotator cuff tear arthropathy with significant pain and dysfunction, failed conservative measures.  Pain interferes with sleep and quality of life.    Past Medical History:  Diagnosis Date  . Anemia    hx of in 20's  . Arthritis   . Asthma   . Breast mass, left 2003  . Chronic back pain    seeing Pain specialist  . Complication of anesthesia    "shallow breathing with general"  . DDD (degenerative disc disease)   . Diabetes mellitus    borderline  . DJD (degenerative joint disease)   . GERD (gastroesophageal reflux disease)   . H/O scarlet fever   . Hemorrhoids   . History of chicken pox   . History of measles, mumps, or rubella as child   all 3  . Hx of bladder infections   . Hypothyroidism   . Irritable bowel syndrome   . Lumbar stenosis    RFA done May 2015  . Menopausal symptoms 2003  . Obesity, morbid (HCC) 2006  . Pericarditis    hx of  . Pneumonia    hx of 5 years ago  . Polyp of colon    removed during colonoscopy  . Spinal headache    x1  . Trichomonas   . Urge incontinence 2005  . Vertigo    over a year was last episode  . Yeast in stool     Past Surgical History:  Procedure Laterality Date  . ABDOMINAL HYSTERECTOMY  02/1990  . APPENDECTOMY  age 66  . BLADDER SUSPENSION    . BREAST LUMPECTOMY Left 2003  . BREAST REDUCTION SURGERY  1992  . CHOLECYSTECTOMY  2010  . COLONOSCOPY  may 2015   4 polyps-benign  . DILATION AND CURETTAGE OF UTERUS    . FOOT SURGERY Right    pinched nerve  . HAND SURGERY Right    right-Dr. Amanda PeaGramig  . HERNIA REPAIR Right 1992  . INTERSTIM IMPLANT PLACEMENT Right 2003   for incontinence  . KNEE SURGERY     bilateral knee; x3 right/ x2 left  . ROTATOR CUFF REPAIR  2008   right, almost complete reconstruction, bone anchors  . TONSILLECTOMY  age 66  . TOTAL HIP ARTHROPLASTY Right 07/30/2014   Procedure: RIGHT  TOTAL HIP ARTHROPLASTY ANTERIOR APPROACH;  Surgeon: Kathryne Hitchhristopher Y Blackman, MD;  Location: [facility];  Service: Orthopedics;  Laterality: Right;  . TOTAL KNEE ARTHROPLASTY Right 04/07/2013   Procedure: RIGHT TOTAL KNEE ARTHROPLASTY;  Surgeon: Loanne DrillingFrank V Aluisio, MD;  Location: [facility];  Service: Orthopedics;  Laterality: Right;  . TOTAL KNEE ARTHROPLASTY Left 09/22/2013   Procedure: LEFT TOTAL KNEE ARTHROPLASTY;  Surgeon: Loanne DrillingFrank V Aluisio, MD;  Location: [facility];  Service: Orthopedics;  Laterality: Left;    Family History  Problem Relation Age of Onset  . Lung cancer Mother   . Stroke Paternal Grandfather   . Ovarian cancer Maternal Aunt   . Breast cancer Maternal Aunt    Social History:  reports that she has never smoked. She has never used smokeless tobacco. She reports that she drinks alcohol. She reports that she does not use drugs.  Allergies:  Allergies  Allergen Reactions  . Oxycodone Hcl Shortness Of Breath and Other (See Comments)    Oxycontin-"turned purple"  . Penicillins Anaphylaxis and Hives    Pt was a baby when this happened Has  patient had a PCN reaction causing immediate rash, facial/tongue/throat swelling, SOB or lightheadedness with hypotension: Yes Has patient had a PCN reaction causing severe rash involving mucus membranes or skin necrosis: Unknown Has patient had a PCN reaction that required hospitalization: Unknown Has patient had a PCN reaction occurring within the last 10 years: No If all of the above answers are "NO", then may proceed with Cephalosporin use.  . Relafen [Nabumetone] Nausea And Vomiting  . Sulfa Antibiotics Other (See Comments)    fever    Medications Prior to Admission  Medication Sig Dispense Refill  . albuterol (PROVENTIL HFA;VENTOLIN HFA) 108 (90 BASE) MCG/ACT inhaler Inhale 2 puffs into the lungs every 4 (four) hours as needed for wheezing or shortness of breath. 1 Inhaler 0  . calcium carbonate (OS-CAL) 600 MG TABS tablet Take 1,200 mg by mouth  daily with breakfast.     . Cyanocobalamin (VITAMIN B-12) 5000 MCG SUBL Place 5,000 mcg under the tongue daily.    Marland Kitchen levothyroxine (SYNTHROID, LEVOTHROID) 50 MCG tablet Take 50 mcg by mouth daily before breakfast.    . Linaclotide (LINZESS) 145 MCG CAPS capsule Take 145 mcg by mouth daily.    . meloxicam (MOBIC) 15 MG tablet Take 15 mg by mouth daily.  2  . metFORMIN (GLUCOPHAGE-XR) 500 MG 24 hr tablet TAKE 1 TABLET BY MOUTH WITH EVENING MEAL EVERY DAY  1  . Multiple Vitamins-Minerals (MULTIVITAMIN WITH MINERALS) tablet Take 1 tablet by mouth daily.    Marland Kitchen omeprazole (PRILOSEC) 20 MG capsule Take 20 mg by mouth daily.  1  . polyethylene glycol (MIRALAX / GLYCOLAX) packet Take 17 g by mouth daily as needed for mild constipation (for constipation).     . Probiotic Product (PROBIOTIC DAILY PO) Take 1 capsule by mouth daily.    . simvastatin (ZOCOR) 10 MG tablet TAKE 1 TABLET BY MOUTH IN THE EVENING EVERY DAY  1  . tolterodine (DETROL LA) 4 MG 24 hr capsule Take 4 mg by mouth daily.  3  . traMADol (ULTRAM) 50 MG tablet Take 100 mg by mouth every 6 (six) hours as needed for moderate pain.      Results for orders placed or performed during the hospital encounter of 04/19/17 (from the past 48 hour(s))  Glucose, capillary     Status: Abnormal   Collection Time: 04/19/17  6:33 AM  Result Value Ref Range   Glucose-Capillary 130 (H) 65 - 99 mg/dL   Comment 1 Notify RN    Comment 2 Document in Chart    No results found.  Review of Systems  All other systems reviewed and are negative.   Blood pressure 128/73, pulse 73, temperature 98.9 F (37.2 C), temperature source Oral, resp. rate 19, height 5\' 8"  (1.727 m), weight (!) 142.8 kg (314 lb 13.1 oz), SpO2 93 %. Physical Exam   Assessment/Plan Endstage L shoulder rotator cuff tear arthropathy with significant pain and dysfunction, failed conservative measures.  Pain interferes with sleep and quality of life. Risks / benefits of surgery  discussed Consent on chart  NPO for OR Preop antibiotics   Mable Paris, MD 04/19/2017, 7:14 AM

## 2017-04-19 NOTE — Anesthesia Preprocedure Evaluation (Signed)
Anesthesia Evaluation  Patient identified by MRN, date of birth, ID band Patient awake    Reviewed: Allergy & Precautions, H&P , NPO status , Patient's Chart, lab work & pertinent test results  History of Anesthesia Complications (+) POST - OP SPINAL HEADACHE  Airway Mallampati: II  TM Distance: >3 FB Neck ROM: full    Dental no notable dental hx. (+) Teeth Intact, Dental Advisory Given   Pulmonary neg pulmonary ROS,    Pulmonary exam normal breath sounds clear to auscultation       Cardiovascular Exercise Tolerance: Good negative cardio ROS Normal cardiovascular exam Rhythm:regular Rate:Normal  History pericarditis   Neuro/Psych  Headaches, Lumbar stenosis.  Vertigo - last episode a year ago negative neurological ROS  negative psych ROS   GI/Hepatic negative GI ROS, Neg liver ROS,   Endo/Other  diabetesHypothyroidism Morbid obesity  Renal/GU negative Renal ROS  negative genitourinary   Musculoskeletal   Abdominal (+) + obese,   Peds  Hematology negative hematology ROS (+)   Anesthesia Other Findings   Reproductive/Obstetrics negative OB ROS                             Anesthesia Physical  Anesthesia Plan  ASA: III  Anesthesia Plan: General and Regional   Post-op Pain Management: GA combined w/ Regional for post-op pain   Induction: Intravenous  Airway Management Planned: Oral ETT  Additional Equipment:   Intra-op Plan:   Post-operative Plan: Extubation in OR  Informed Consent: I have reviewed the patients History and Physical, chart, labs and discussed the procedure including the risks, benefits and alternatives for the proposed anesthesia with the patient or authorized representative who has indicated his/her understanding and acceptance.   Dental Advisory Given  Plan Discussed with: CRNA and Surgeon  Anesthesia Plan Comments:         Anesthesia Quick  Evaluation

## 2017-04-19 NOTE — Discharge Instructions (Signed)

## 2017-04-19 NOTE — Op Note (Signed)
Procedure(s): REVERSE SHOULDER ARTHROPLASTY Procedure Note  Archie EndoDebra W Dickerson female 66 y.o. 04/19/2017  Procedure(s) and Anesthesia Type:    * LEFT REVERSE SHOULDER ARTHROPLASTY - Choice   Indications:  66 y.o. female  With endstage left shoulder arthritis with irrepairable rotator cuff tear. Pain and dysfunction interfered with quality of life and nonoperative treatment with activity modification, NSAIDS and injections failed.     Surgeon: Mable ParisHANDLER,Jahiem Franzoni WILLIAM   Assistants: Damita Lackanielle Lalibert PA-C [facility](Danielle was present and scrubbed throughout the procedure and was essential in positioning, retraction, exposure, and closure)  Anesthesia: General endotracheal anesthesia with preoperative interscalene block given by the attending anesthesiologist    Procedure Detail  REVERSE SHOULDER ARTHROPLASTY   Estimated Blood Loss:  200 mL         Drains: none  Blood Given: none          Specimens: none        Complications:  * No complications entered in OR log *         Disposition: PACU - hemodynamically stable.         Condition: stable      OPERATIVE FINDINGS:  A DJO Altivate pressfit reverse total shoulder arthroplasty was placed with a  size 6 stem with the small tray, a 32-4 glenosphere, and a +3-mm poly insert. The base plate  fixation was excellent.  PROCEDURE: The patient was identified in the preoperative holding area  where I personally marked the operative site after verifying site, side,  and procedure with the patient. An interscalene block given by  the attending anesthesiologist in the holding area and the patient was taken back to the operating room where all extremities were  carefully padded in position after general anesthesia was induced. She  was placed in a beach-chair position and the operative upper extremity was  prepped and draped in a standard sterile fashion. The patient received 1 g IV tranexamic acid at the start of the case around time of the  incision.  An approximately 10-  cm incision was made from the tip of the coracoid process to the center  point of the humerus at the level of the axilla. Dissection was carried  down through subcutaneous tissues to the level of the cephalic vein  which was taken laterally with the deltoid. The pectoralis major was  retracted medially. The subdeltoid space was developed and the lateral  edge of the conjoined tendon was identified. The undersurface of  conjoined tendon was palpated and the musculocutaneous nerve was not in  the field. Retractor was placed underneath the conjoined and second  retractor was placed lateral into the deltoid. The circumflex humeral  artery and vessels were identified and clamped and coagulated. The  biceps tendon was absent.  The subscapularis was chronically torn.  The  joint was then gently externally rotated while the capsule was released  from the humeral neck around to just beyond the 6 o'clock position. At  this point, the joint was dislocated and the humeral head was presented  into the wound. The excessive osteophyte formation was removed with a  large rongeur.  The cutting guide was used to make the appropriate  head cut and the head was saved for potentially bone grafting.  The glenoid was exposed with the arm in an  abducted extended position. The anterior and posterior labrum were  completely excised and the capsule was released circumferentially to  allow for exposure of the glenoid for preparation. The anterior osteophyte was removed.  The 2.5 mm drill was  placed using the guide in 5-10 inferior angulation and the tap was then advanced in the same hole. Small and large reamers were then used. The tap was then removed and the Metaglene was then screwed in with excellent purchase.  The peripheral guide was then used to drilled measured and filled peripheral locking screws. The size  32-4  glenosphere was then impacted on the [facility] taper and the central  screw was placed. The humerus was then again exposed and the diaphyseal reamers were used followed by the metaphyseal reamers. The final broach was left in place in the proximal trial was placed. The joint was reduced and with this implant it was felt that soft tissue tensioning was appropriate with excellent stability and excellent range of motion. Therefore, final humeral stem was placed press-fit.  And then the trial polyethylene inserts were tested again and the above implant was felt to be the most appropriate for final insertion. The joint was reduced taken through full range of motion and felt to be stable. Soft tissue tension was appropriate.  The joint was then copiously irrigated with pulse  lavage and the wound was then closed. The subscapularis was not repaired. Throughout the case a mixture of 20 mL liposomal bupivacaine and 40 mL normal saline was used to infiltrate the deep capsular tissue, bony surfaces and subcutaneous tissue.  Skin was closed with 2-0 Vicryl in a deep dermal layer and 4-0  Monocryl for skin closure. Steri-Strips were applied. Sterile  dressings were then applied as well as a sling. The patient was allowed  to awaken from general anesthesia, transferred to stretcher, and taken  to recovery room in stable condition.   POSTOPERATIVE PLAN: The patient will be kept in the hospital postoperatively  for pain control and therapy.

## 2017-04-19 NOTE — Anesthesia Postprocedure Evaluation (Signed)
Anesthesia Post Note  Patient: Archie EndoDebra W [place]  Procedure(s) Performed: Procedure(s) (LRB): REVERSE SHOULDER ARTHROPLASTY (Left)  Patient location during evaluation: PACU Anesthesia Type: Regional Level of consciousness: awake and alert Pain management: pain level controlled Vital Signs Assessment: post-procedure vital signs reviewed and stable Respiratory status: spontaneous breathing, nonlabored ventilation and respiratory function stable Cardiovascular status: blood pressure returned to baseline and stable Postop Assessment: no signs of nausea or vomiting Anesthetic complications: no       Last Vitals:  Vitals:   04/19/17 1008 04/19/17 1015  BP: 103/65   Pulse: 91 94  Resp: 19 16  Temp:      Last Pain:  Vitals:   04/19/17 1030  TempSrc:   PainSc: 0-No pain                 Lowella CurbWarren Ray Verdella Laidlaw

## 2017-04-19 NOTE — Transfer of Care (Signed)
Immediate Anesthesia Transfer of Care Note  Patient: Archie EndoDebra W Enterline  Procedure(s) Performed: Procedure(s) with comments: REVERSE SHOULDER ARTHROPLASTY (Left) - REVERSE SHOULDER ARTHROPLASTY  Patient Location: PACU  Anesthesia Type:General  Level of Consciousness: awake, alert , oriented and patient cooperative  Airway & Oxygen Therapy: Patient Spontanous Breathing and Patient connected to nasal cannula oxygen  Post-op Assessment: Report given to RN, Post -op Vital signs reviewed and stable and Patient moving all extremities  Post vital signs: Reviewed and stable  Last Vitals:  Vitals:   04/19/17 0559 04/19/17 0938  BP: 128/73   Pulse: 73   Resp: 19   Temp: 37.2 C 36.2 C    Last Pain:  Vitals:   04/19/17 0938  TempSrc:   PainSc: 0-No pain      Patients Stated Pain Goal: 5 (04/19/17 0559)  Complications: No apparent anesthesia complications

## 2017-04-20 ENCOUNTER — Encounter (HOSPITAL_COMMUNITY): Payer: Self-pay | Admitting: Orthopedic Surgery

## 2017-04-20 LAB — CBC
HCT: 36.6 % (ref 36.0–46.0)
Hemoglobin: 11.3 g/dL — ABNORMAL LOW (ref 12.0–15.0)
MCH: 27.5 pg (ref 26.0–34.0)
MCHC: 30.9 g/dL (ref 30.0–36.0)
MCV: 89.1 fL (ref 78.0–100.0)
PLATELETS: 212 10*3/uL (ref 150–400)
RBC: 4.11 MIL/uL (ref 3.87–5.11)
RDW: 14.8 % (ref 11.5–15.5)
WBC: 11.6 10*3/uL — AB (ref 4.0–10.5)

## 2017-04-20 LAB — BASIC METABOLIC PANEL
ANION GAP: 6 (ref 5–15)
BUN: 12 mg/dL (ref 6–20)
CALCIUM: 8.8 mg/dL — AB (ref 8.9–10.3)
CO2: 26 mmol/L (ref 22–32)
CREATININE: 0.69 mg/dL (ref 0.44–1.00)
Chloride: 105 mmol/L (ref 101–111)
Glucose, Bld: 135 mg/dL — ABNORMAL HIGH (ref 65–99)
Potassium: 4 mmol/L (ref 3.5–5.1)
Sodium: 137 mmol/L (ref 135–145)

## 2017-04-20 LAB — GLUCOSE, CAPILLARY: Glucose-Capillary: 124 mg/dL — ABNORMAL HIGH (ref 65–99)

## 2017-04-20 MED ORDER — METHOCARBAMOL 500 MG PO TABS: 500.0000 mg | ORAL_TABLET | Freq: Two times a day (BID) | ORAL | 30 refills | Status: DC

## 2017-04-20 MED ORDER — DOCUSATE SODIUM 100 MG PO CAPS: 100.0000 mg | ORAL_CAPSULE | Freq: Three times a day (TID) | ORAL | 0 refills | Status: DC | PRN

## 2017-04-20 MED ORDER — HYDROCODONE-ACETAMINOPHEN 5-325 MG PO TABS: 1.0000 | ORAL_TABLET | ORAL | 0 refills | Status: DC | PRN

## 2017-04-20 NOTE — Evaluation (Signed)
Occupational Therapy Evaluation and Discharge Patient Details Name: Kelly Taylor MRN: [id number] DOB: [birth year] Today's Date: 04/20/2017    History of Present Illness Pt is a 66 y.o. female s/p L reverse TSA. PMHx: Arthritis, Chronic back pain, DDD, DM, DJD, GERD, Hypothyroidism, IBS, Lumbar stenosis, Obesity, Vertigo, Bil TKA, R rotator cuff repair, R hand surgery.   Clinical Impression   Pt reports she was independent with ADL PTA. Currently pt overall supervision for functional mobility and min assist for ADL. All shoulder, safety, and ADL education completed with pt. Pt able to return demo elbow/wrist/hand exercises and verbalized understanding of shoulder precautions. Pt planning to d/c home today with 24/7 supervision from her husband. No further acute OT needs identified; signing off at this time. Please re-consult if needs change. Thank you for this referral.    Follow Up Recommendations  DC plan and follow up therapy as arranged by surgeon;Supervision - Intermittent    Equipment Recommendations  None recommended by OT    Recommendations for Other Services       Precautions / Restrictions Precautions Precautions: Shoulder;Fall Type of Shoulder Precautions: Conservative protocol: AROM elbow/wrist/hand OK. NO A/PROM at shoulder. Shoulder Interventions: Shoulder sling/immobilizer;At all times;Off for dressing/bathing/exercises Precaution Booklet Issued: Yes (comment) Precaution Comments: Educated pt on shoulder precautions and provided handout Required Braces or Orthoses: Sling Restrictions Weight Bearing Restrictions: Yes LUE Weight Bearing: Non weight bearing      Mobility Bed Mobility Overal bed mobility: Modified Independent                Transfers Overall transfer level: Needs assistance Equipment used: None Transfers: Sit to/from Stand Sit to Stand: Supervision         General transfer comment: for safety; no physical assist for multiple sit to  stand from EOB    Balance Overall balance assessment: Needs assistance Sitting-balance support: Feet supported;No upper extremity supported Sitting balance-Leahy Scale: Normal     Standing balance support: No upper extremity supported;During functional activity Standing balance-Leahy Scale: Good                             ADL either performed or assessed with clinical judgement   ADL Overall ADL's : Needs assistance/impaired Eating/Feeding: Set up;Sitting   Grooming: Set up;Sitting;Oral care   Upper Body Bathing: Supervision/ safety;Sitting   Lower Body Bathing: Supervison/ safety;Sit to/from stand   Upper Body Dressing : Minimal assistance;Sitting Upper Body Dressing Details (indicate cue type and reason): Assist for bra and sling. Pt able to manage shirt with set up Lower Body Dressing: Minimal assistance;Sit to/from stand Lower Body Dressing Details (indicate cue type and reason): Assist to pull up unerwear and pants, pt able to manage all other aspects Toilet Transfer: Supervision/safety;Ambulation;Regular Toilet           Functional mobility during ADLs: Supervision/safety General ADL Comments: Educated pt on UB bathing/dressing technique, proper positioning in bed, sling management and wear schedule, LUE NWB, L elbow/wrist/hand exercises and no movement at shoulder.     Vision         Perception     Praxis      Pertinent Vitals/Pain Pain Assessment: 0-10 Pain Score: 5  Pain Location: L shoulder Pain Descriptors / Indicators: Aching;Sore Pain Intervention(s): Monitored during session;Premedicated before session;Repositioned;Ice applied     Hand Dominance Left   Extremity/Trunk Assessment Upper Extremity Assessment Upper Extremity Assessment: LUE deficits/detail LUE Deficits / Details: elbow/wrist/hand ROM and strength WFL.  Unable to test at shoulder LUE: Unable to fully assess due to immobilization   Lower Extremity Assessment Lower  Extremity Assessment: Overall WFL for tasks assessed   Cervical / Trunk Assessment Cervical / Trunk Assessment: Other exceptions Cervical / Trunk Exceptions: chronic back pain   Communication Communication Communication: No difficulties   Cognition Arousal/Alertness: Awake/alert Behavior During Therapy: WFL for tasks assessed/performed Overall Cognitive Status: Within Functional Limits for tasks assessed                                     General Comments       Exercises Exercises: Shoulder Shoulder Exercises Elbow Flexion: AROM;Left;10 reps;Seated Elbow Extension: AROM;Left;10 reps;Seated Wrist Flexion: AROM;Left;10 reps;Seated Wrist Extension: AROM;Left;10 reps;Seated Digit Composite Flexion: AROM;Left;10 reps;Seated Composite Extension: AROM;Left;10 reps;Seated   Shoulder Instructions Shoulder Instructions Donning/doffing shirt without moving shoulder: Supervision/safety Method for sponge bathing under operated UE: Supervision/safety Donning/doffing sling/immobilizer: Minimal assistance Correct positioning of sling/immobilizer: Supervision/safety ROM for elbow, wrist and digits of operated UE: Supervision/safety Sling wearing schedule (on at all times/off for ADL's): Supervision/safety Proper positioning of operated UE when showering: Supervision/safety Positioning of UE while sleeping: Supervision/safety    Home Living Family/patient expects to be discharged to:: Private residence Living Arrangements: Spouse/significant other Available Help at Discharge: Family;Available 24 hours/day Type of Home: House Home Access: Stairs to enter [employer] of Steps: 1   Home Layout: Two level;Able to live on main level with bedroom/bathroom     Bathroom Shower/Tub: Tub/shower unit;Curtain   Firefighter: Standard     Home Equipment: Shower seat;Grab bars - toilet;Grab bars - tub/shower          Prior Functioning/Environment Level of  Independence: Independent                 OT Problem List:        OT Treatment/Interventions:      OT Goals(Current goals can be found in the care plan section) Acute Rehab OT Goals Patient Stated Goal: go home this morning OT Goal Formulation: All assessment and education complete, DC therapy  OT Frequency:     Barriers to D/C:            Co-evaluation              AM-PAC PT "6 Clicks" Daily Activity     Outcome Measure Help from another person eating meals?: None Help from another person taking care of personal grooming?: None Help from another person toileting, which includes using toliet, bedpan, or urinal?: A Little Help from another person bathing (including washing, rinsing, drying)?: A Little Help from another person to put on and taking off regular upper body clothing?: A Little Help from another person to put on and taking off regular lower body clothing?: A Little 6 Click Score: 20   End of Session Equipment Utilized During Treatment: Other (comment) (sling) Nurse Communication: Mobility status;Weight bearing status;Precautions (pt ready for d/c from OT standpoint)  Activity Tolerance: Patient tolerated treatment well Patient left: in chair;with call bell/phone within reach  OT Visit Diagnosis: Pain Pain - Right/Left: Left Pain - part of body: Shoulder                Time: 1610-9604 OT Time Calculation (min): 27 min Charges:  OT General Charges $OT Visit: 1 Procedure OT Evaluation $OT Eval Moderate Complexity: 1 Procedure OT Treatments $Self Care/Home Management : 8-22 mins G-Codes:  Dirck Butch A. Brett Albinooffey, M.S., OTR/L Pager: [phone]  Gaye AlkenBailey A Sundus Pete 04/20/2017, 10:14 AM

## 2017-04-20 NOTE — Progress Notes (Signed)
   PATIENT ID: Kelly Taylor   1 Day Post-Op Procedure(s) (LRB): REVERSE SHOULDER ARTHROPLASTY (Left)  Subjective: Doing well, pain well controlled. Dressing changed last night but looks okay today. No other complaints.   Objective:  Vitals:   04/19/17 2100 04/20/17 0540  BP: 115/82 117/81  Pulse: 79 82  Resp: 18   Temp: 98.2 F (36.8 C) 97.9 F (36.6 C)     L UE dressing c/d/i Wiggles fingers, distally NVI  Labs:  No results for input(s): HGB in the last 72 hours.No results for input(s): WBC, RBC, HCT, PLT in the last 72 hours.No results for input(s): NA, K, CL, CO2, BUN, CREATININE, GLUCOSE, CALCIUM in the last 72 hours.  Assessment and Plan: 1 day s/p left reverse TSA Continue pain rx, norco seems to be working well D/c home today after OT OT- hand wrist elbow ROM only Fu with Dr. Ave Filterhandler in 2 weeks  VTE proph: ASA, SCDs

## 2017-04-20 NOTE — Progress Notes (Signed)
Dressing change done at 0320.  Right at the crease of the left under arm bloody drainage was seeping through in minimum amount.  Two Aquacel dressing are in place now.

## 2017-04-20 NOTE — Progress Notes (Signed)
Discharged patient to home; verbalized understanding of written and verbal discharge instructions; understands exercise regimen until MD seen for followup. Left unit via wheelchair, accompanied by spouse and Engineer, watervolunteer transport personnel

## 2017-04-20 NOTE — Discharge Summary (Signed)
Patient ID: Kelly Taylor MRN: [id number] DOB/AGE: 64-15-1952 64 y.o.  Admit date: 04/19/2017 Discharge date: 04/20/2017  Admission Diagnoses:  Active Problems:   S/P reverse total shoulder arthroplasty, left   Discharge Diagnoses:  Same  Past Medical History:  Diagnosis Date  . Anemia    hx of in 20's  . Arthritis   . Asthma   . Breast mass, left 2003  . Chronic back pain    seeing Pain specialist  . Complication of anesthesia    "shallow breathing with general"  . DDD (degenerative disc disease)   . Diabetes mellitus    borderline  . DJD (degenerative joint disease)   . GERD (gastroesophageal reflux disease)   . H/O scarlet fever   . Hemorrhoids   . History of chicken pox   . History of measles, mumps, or rubella as child   all 3  . Hx of bladder infections   . Hypothyroidism   . Irritable bowel syndrome   . Lumbar stenosis    RFA done May 2015  . Menopausal symptoms 2003  . Obesity, morbid (HCC) 2006  . Pericarditis    hx of  . Pneumonia    hx of 5 years ago  . Polyp of colon    removed during colonoscopy  . Spinal headache    x1  . Trichomonas   . Urge incontinence 2005  . Vertigo    over a year was last episode  . Yeast in stool     Surgeries: Procedure(s): REVERSE SHOULDER ARTHROPLASTY on 04/19/2017   Consultants:   Discharged Condition: Improved  Hospital Course: Kelly Taylor is an 66 y.o. female who was admitted 04/19/2017 for operative treatment ofleft rotator cuff tear arthropathy. Patient has severe unremitting pain that affects sleep, daily activities, and work/hobbies. After pre-op clearance the patient was taken to the operating room on 04/19/2017 and underwent  Procedure(s): REVERSE SHOULDER ARTHROPLASTY.    Patient was given perioperative antibiotics: Anti-infectives    Start     Dose/Rate Route Frequency Ordered Stop   04/19/17 1400  clindamycin (CLEOCIN) IVPB 600 mg     600 mg 100 mL/hr over 30 Minutes Intravenous Every 6 hours  04/19/17 1110 04/20/17 0354   04/18/17 0845  vancomycin (VANCOCIN) 1,500 mg in sodium chloride 0.9 % 500 mL IVPB  Status:  Discontinued     1,500 mg 250 mL/hr over 120 Minutes Intravenous On call to O.R. 04/18/17 4098 04/19/17 0559       Patient was given sequential compression devices, early ambulation, and ASA to prevent DVT.  Patient benefited maximally from hospital stay and there were no complications.    Recent vital signs: Patient Vitals for the past 24 hrs:  BP Temp Temp src Pulse Resp SpO2  04/20/17 0540 117/81 97.9 F (36.6 C) Axillary 82 - 96 %  04/19/17 2100 115/82 98.2 F (36.8 C) Oral 79 18 92 %  04/19/17 1100 104/64 98.3 F (36.8 C) Oral 83 20 92 %  04/19/17 1045 - 97.9 F (36.6 C) - 88 15 91 %  04/19/17 1039 103/60 - - 90 15 93 %  04/19/17 1030 - - - 89 13 93 %  04/19/17 1023 99/62 - - 92 16 91 %  04/19/17 1015 - - - 94 16 92 %  04/19/17 1008 103/65 - - 91 19 94 %  04/19/17 1000 - - - 93 (!) 28 95 %  04/19/17 0953 (!) 101/47 - - - (!) 24 -  04/19/17  0945 - - - 95 17 95 %  04/19/17 0942 (!) 107/51 - - (!) 103 18 91 %  04/19/17 0938 - 97.2 F (36.2 C) - - - -     Recent laboratory studies: No results for input(s): WBC, HGB, HCT, PLT, NA, K, CL, CO2, BUN, CREATININE, GLUCOSE, INR, CALCIUM in the last 72 hours.  Invalid input(s): PT, 2   Discharge Medications:   Allergies as of 04/20/2017      Reactions   Oxycodone Hcl Shortness Of Breath, Other (See Comments)   Oxycontin-"turned purple" 04/19/17:  Per pt, can take Vicodin and Morphine.kph   Penicillins Anaphylaxis, Hives   Pt was a baby when this happened Has patient had a PCN reaction causing immediate rash, facial/tongue/throat swelling, SOB or lightheadedness with hypotension: Yes Has patient had a PCN reaction causing severe rash involving mucus membranes or skin necrosis: Unknown Has patient had a PCN reaction that required hospitalization: Unknown Has patient had a PCN reaction occurring within the  last 10 years: No If all of the above answers are "NO", then may proceed with Cephalosporin use.   Relafen [nabumetone] Nausea And Vomiting   Sulfa Antibiotics Other (See Comments)   fever      Medication List    STOP taking these medications   meloxicam 15 MG tablet Commonly known as:  MOBIC     TAKE these medications   albuterol 108 (90 Base) MCG/ACT inhaler Commonly known as:  PROVENTIL HFA;VENTOLIN HFA Inhale 2 puffs into the lungs every 4 (four) hours as needed for wheezing or shortness of breath.   calcium carbonate 600 MG Tabs tablet Commonly known as:  OS-CAL Take 1,200 mg by mouth daily with breakfast.   docusate sodium 100 MG capsule Commonly known as:  COLACE Take 1 capsule (100 mg total) by mouth 3 (three) times daily as needed.   HYDROcodone-acetaminophen 5-325 MG tablet Commonly known as:  NORCO Take 1-2 tablets by mouth every 4 (four) hours as needed for moderate pain.   levothyroxine 50 MCG tablet Commonly known as:  SYNTHROID, LEVOTHROID Take 50 mcg by mouth daily before breakfast.   LINZESS 145 MCG Caps capsule Generic drug:  linaclotide Take 145 mcg by mouth daily.   metFORMIN 500 MG 24 hr tablet Commonly known as:  GLUCOPHAGE-XR TAKE 1 TABLET BY MOUTH WITH EVENING MEAL EVERY DAY   methocarbamol 500 MG tablet Commonly known as:  ROBAXIN Take 1 tablet (500 mg total) by mouth 2 (two) times daily.   multivitamin with minerals tablet Take 1 tablet by mouth daily.   omeprazole 20 MG capsule Commonly known as:  PRILOSEC Take 20 mg by mouth daily.   polyethylene glycol packet Commonly known as:  MIRALAX / GLYCOLAX Take 17 g by mouth daily as needed for mild constipation (for constipation).   PROBIOTIC DAILY PO Take 1 capsule by mouth daily.   simvastatin 10 MG tablet Commonly known as:  ZOCOR TAKE 1 TABLET BY MOUTH IN THE EVENING EVERY DAY   tolterodine 4 MG 24 hr capsule Commonly known as:  DETROL LA Take 4 mg by mouth daily.    traMADol 50 MG tablet Commonly known as:  ULTRAM Take 100 mg by mouth every 6 (six) hours as needed for moderate pain.   Vitamin B-12 5000 MCG Subl Place 5,000 mcg under the tongue daily.       Diagnostic Studies: Dg Chest 2 View  Result Date: 04/13/2017 CLINICAL DATA:  Left shoulder replacement. EXAM: CHEST  2 VIEW  COMPARISON:  CT 02/22/2017, 04/25/2014. Chest x-ray 11/01/2016, 04/25/2014. FINDINGS: Mediastinum and hilar structures are normal. Mild left base subsegmental atelectasis and/or scarring. Stable right apical pleuroparenchymal thickening consistent scarring . No pleural effusion or pneumothorax. Heart size normal. Thoracic spine scoliosis and prominent degenerative change. Degenerative changes both shoulders. IMPRESSION: 1. Mild left base subsegmental atelectasis and/or scarring. Stable right apical pleuroparenchymal thickening consistent with scarring. No acute alveolar infiltrate. 2. Thoracic spine scoliosis and prominent degenerative change. Degenerative changes both shoulders. Electronically Signed   By: Maisie Fus  Register   On: 04/13/2017 09:37   Dg Shoulder Left Port  Result Date: 04/19/2017 CLINICAL DATA:  Left shoulder replacement EXAM: LEFT SHOULDER - 1 VIEW COMPARISON:  None. FINDINGS: Interval total reverse left shoulder arthroplasty without failure or complication. No acute fracture or dislocation. Severe arthropathy of the acromioclavicular joint. IMPRESSION: Interval total reverse left shoulder arthroplasty without failure or complication. Electronically Signed   By: Elige Ko   On: 04/19/2017 10:32    Disposition: 01-Home or Self Care  Discharge Instructions    Call MD / Call 911    Complete by:  As directed    If you experience chest pain or shortness of breath, CALL 911 and be transported to the hospital emergency room.  If you develope a fever above 101 F, pus (white drainage) or increased drainage or redness at the wound, or calf pain, call your surgeon's  office.   Constipation Prevention    Complete by:  As directed    Drink plenty of fluids.  Prune juice may be helpful.  You may use a stool softener, such as Colace (over the counter) 100 mg twice a day.  Use MiraLax (over the counter) for constipation as needed.   Diet - low sodium heart healthy    Complete by:  As directed    Increase activity slowly as tolerated    Complete by:  As directed       Follow-up Information    Peyton Broom, MD. Schedule an appointment as soon as possible for a visit in 2 weeks.   Specialty:  Orthopedic Surgery Contact information: [address] [place] [address] [phone]            Signed: Jiles Harold 04/20/2017, 8:08 AM

## 2017-05-31 NOTE — Anesthesia Postprocedure Evaluation (Signed)
Anesthesia Post Note  Patient: Kelly EndoDebra W Taylor  Procedure(s) Performed: Procedure(s) (LRB): REVERSE SHOULDER ARTHROPLASTY (Left)     Anesthesia Post Evaluation  Last Vitals:  Vitals:   04/19/17 2100 04/20/17 0540  BP: 115/82 117/81  Pulse: 79 82  Resp: 18   Temp: 36.8 C 36.6 C    Last Pain:  Vitals:   04/20/17 0822  TempSrc:   PainSc: 4                  Lowella CurbWarren Ray Olanrewaju Osborn

## 2017-05-31 NOTE — Addendum Note (Signed)
Addendum  created 05/31/17 1653 by Dorita Rowlands Ray, MD   Sign clinical note    

## 2017-08-03 ENCOUNTER — Other Ambulatory Visit: Payer: Self-pay | Admitting: Rehabilitation

## 2017-08-03 DIAGNOSIS — K589 Irritable bowel syndrome without diarrhea: Secondary | ICD-10-CM | POA: Insufficient documentation

## 2017-08-03 DIAGNOSIS — K219 Gastro-esophageal reflux disease without esophagitis: Secondary | ICD-10-CM | POA: Insufficient documentation

## 2017-08-03 DIAGNOSIS — N3941 Urge incontinence: Secondary | ICD-10-CM | POA: Insufficient documentation

## 2017-08-03 DIAGNOSIS — N644 Mastodynia: Secondary | ICD-10-CM | POA: Insufficient documentation

## 2017-08-03 DIAGNOSIS — R0602 Shortness of breath: Secondary | ICD-10-CM | POA: Insufficient documentation

## 2017-08-03 DIAGNOSIS — E079 Disorder of thyroid, unspecified: Secondary | ICD-10-CM | POA: Insufficient documentation

## 2017-08-03 DIAGNOSIS — M1990 Unspecified osteoarthritis, unspecified site: Secondary | ICD-10-CM | POA: Insufficient documentation

## 2017-08-03 DIAGNOSIS — M47817 Spondylosis without myelopathy or radiculopathy, lumbosacral region: Secondary | ICD-10-CM

## 2017-08-13 ENCOUNTER — Ambulatory Visit
Admission: RE | Admit: 2017-08-13 | Discharge: 2017-08-13 | Disposition: A | Discharge status: Home / Self Care | Payer: BLUE CROSS/BLUE SHIELD | Source: Ambulatory Visit | Attending: Rehabilitation | Admitting: Rehabilitation

## 2017-08-13 DIAGNOSIS — M47817 Spondylosis without myelopathy or radiculopathy, lumbosacral region: Secondary | ICD-10-CM

## 2017-08-13 MED ORDER — ONDANSETRON HCL 4 MG/2ML IJ SOLN: 4.0000 mg | Freq: Four times a day (QID) | INTRAMUSCULAR | Status: DC | PRN

## 2017-08-13 MED ORDER — ONDANSETRON HCL 4 MG/2ML IJ SOLN
4.0000 mg | Freq: Once | INTRAMUSCULAR | Status: AC
  Administered 2017-08-13: 4 mg via INTRAMUSCULAR

## 2017-08-13 MED ORDER — MEPERIDINE HCL 100 MG/ML IJ SOLN
100.0000 mg | Freq: Once | INTRAMUSCULAR | Status: AC
  Administered 2017-08-13: 100 mg via INTRAMUSCULAR

## 2017-08-13 MED ORDER — IOPAMIDOL (ISOVUE-M 200) INJECTION 41%
15.0000 mL | Freq: Once | INTRAMUSCULAR | Status: AC
  Administered 2017-08-13: 15 mL via INTRATHECAL

## 2017-08-13 MED ORDER — DIAZEPAM 5 MG PO TABS
10.0000 mg | ORAL_TABLET | Freq: Once | ORAL | Status: AC
  Administered 2017-08-13: 10 mg via ORAL

## 2017-08-13 NOTE — Discharge Instructions (Signed)
Myelogram Discharge Instructions  1. Go home and rest quietly for the next 24 hours.  It is important to lie flat for the next 24 hours.  Get up only to go to the restroom.  You may lie in the bed or on a couch on your back, your stomach, your left side or your right side.  You may have one pillow under your head.  You may have pillows between your knees while you are on your side or under your knees while you are on your back.  2. DO NOT drive today.  Recline the seat as far back as it will go, while still wearing your seat belt, on the way home.  3. You may get up to go to the bathroom as needed.  You may sit up for 10 minutes to eat.  You may resume your normal diet and medications unless otherwise indicated.  Drink lots of extra fluids today and tomorrow.  4. The incidence of headache, nausea, or vomiting is about 5% (one in 20 patients).  If you develop a headache, lie flat and drink plenty of fluids until the headache goes away.  Caffeinated beverages may be helpful.  If you develop severe nausea and vomiting or a headache that does not go away with flat bed rest, call [phone].  5. You may resume normal activities after your 24 hours of bed rest is over; however, do not exert yourself strongly or do any heavy lifting tomorrow. If when you get up you have a headache when standing, go back to bed and force fluids for another 24 hours.  6. Call your physician for a follow-up appointment.  The results of your myelogram will be sent directly to your physician by the following day.  7. If you have any questions or if complications develop after you arrive home, please call [phone].  Discharge instructions have been explained to the patient.  The patient, or the person responsible for the patient, fully understands these instructions.       May resume Tramadol on Sept. 25, 2018, after 9:30am.

## 2017-08-13 NOTE — Progress Notes (Signed)
Patient states she has been off Tramadol for at least the past two days.  Tykeem Lanzer, RN 

## 2017-08-14 ENCOUNTER — Telehealth: Payer: Self-pay | Admitting: Radiology

## 2018-01-16 DIAGNOSIS — E1169 Type 2 diabetes mellitus with other specified complication: Secondary | ICD-10-CM | POA: Insufficient documentation

## 2018-01-16 DIAGNOSIS — E119 Type 2 diabetes mellitus without complications: Secondary | ICD-10-CM | POA: Insufficient documentation

## 2018-09-17 DIAGNOSIS — E785 Hyperlipidemia, unspecified: Secondary | ICD-10-CM | POA: Diagnosis not present

## 2018-09-17 DIAGNOSIS — N182 Chronic kidney disease, stage 2 (mild): Secondary | ICD-10-CM | POA: Diagnosis not present

## 2018-09-17 DIAGNOSIS — E1122 Type 2 diabetes mellitus with diabetic chronic kidney disease: Secondary | ICD-10-CM | POA: Diagnosis not present

## 2018-09-17 DIAGNOSIS — E039 Hypothyroidism, unspecified: Secondary | ICD-10-CM | POA: Diagnosis not present

## 2018-09-24 DIAGNOSIS — E039 Hypothyroidism, unspecified: Secondary | ICD-10-CM | POA: Diagnosis not present

## 2018-09-24 DIAGNOSIS — N182 Chronic kidney disease, stage 2 (mild): Secondary | ICD-10-CM | POA: Diagnosis not present

## 2018-09-24 DIAGNOSIS — E785 Hyperlipidemia, unspecified: Secondary | ICD-10-CM | POA: Diagnosis not present

## 2018-09-24 DIAGNOSIS — I319 Disease of pericardium, unspecified: Secondary | ICD-10-CM | POA: Diagnosis not present

## 2018-09-24 DIAGNOSIS — Z23 Encounter for immunization: Secondary | ICD-10-CM | POA: Diagnosis not present

## 2018-09-24 DIAGNOSIS — E1122 Type 2 diabetes mellitus with diabetic chronic kidney disease: Secondary | ICD-10-CM | POA: Diagnosis not present

## 2018-10-04 DIAGNOSIS — E119 Type 2 diabetes mellitus without complications: Secondary | ICD-10-CM | POA: Diagnosis not present

## 2018-10-04 DIAGNOSIS — R0609 Other forms of dyspnea: Secondary | ICD-10-CM | POA: Diagnosis not present

## 2018-10-04 DIAGNOSIS — Z8679 Personal history of other diseases of the circulatory system: Secondary | ICD-10-CM | POA: Diagnosis not present

## 2018-10-14 DIAGNOSIS — M5126 Other intervertebral disc displacement, lumbar region: Secondary | ICD-10-CM | POA: Diagnosis not present

## 2018-10-14 DIAGNOSIS — M7918 Myalgia, other site: Secondary | ICD-10-CM | POA: Diagnosis not present

## 2018-10-14 DIAGNOSIS — M545 Low back pain: Secondary | ICD-10-CM | POA: Diagnosis not present

## 2018-10-15 DIAGNOSIS — R1013 Epigastric pain: Secondary | ICD-10-CM | POA: Diagnosis not present

## 2018-10-15 DIAGNOSIS — R112 Nausea with vomiting, unspecified: Secondary | ICD-10-CM | POA: Diagnosis not present

## 2018-10-25 DIAGNOSIS — R0602 Shortness of breath: Secondary | ICD-10-CM | POA: Diagnosis not present

## 2018-11-01 DIAGNOSIS — Z8679 Personal history of other diseases of the circulatory system: Secondary | ICD-10-CM | POA: Diagnosis not present

## 2018-11-01 DIAGNOSIS — R0609 Other forms of dyspnea: Secondary | ICD-10-CM | POA: Diagnosis not present

## 2018-11-01 DIAGNOSIS — E119 Type 2 diabetes mellitus without complications: Secondary | ICD-10-CM | POA: Diagnosis not present

## 2018-11-11 DIAGNOSIS — J020 Streptococcal pharyngitis: Secondary | ICD-10-CM | POA: Diagnosis not present

## 2018-11-11 DIAGNOSIS — J029 Acute pharyngitis, unspecified: Secondary | ICD-10-CM | POA: Diagnosis not present

## 2018-11-11 DIAGNOSIS — R05 Cough: Secondary | ICD-10-CM | POA: Diagnosis not present

## 2018-11-19 DIAGNOSIS — M25512 Pain in left shoulder: Secondary | ICD-10-CM | POA: Diagnosis not present

## 2018-11-19 DIAGNOSIS — M542 Cervicalgia: Secondary | ICD-10-CM | POA: Diagnosis not present

## 2018-11-19 DIAGNOSIS — G894 Chronic pain syndrome: Secondary | ICD-10-CM | POA: Diagnosis not present

## 2018-11-19 DIAGNOSIS — M5126 Other intervertebral disc displacement, lumbar region: Secondary | ICD-10-CM | POA: Diagnosis not present

## 2018-12-04 DIAGNOSIS — M19012 Primary osteoarthritis, left shoulder: Secondary | ICD-10-CM | POA: Diagnosis not present

## 2018-12-04 DIAGNOSIS — G5602 Carpal tunnel syndrome, left upper limb: Secondary | ICD-10-CM | POA: Diagnosis not present

## 2018-12-04 DIAGNOSIS — Z471 Aftercare following joint replacement surgery: Secondary | ICD-10-CM | POA: Diagnosis not present

## 2018-12-04 DIAGNOSIS — Z96612 Presence of left artificial shoulder joint: Secondary | ICD-10-CM | POA: Diagnosis not present

## 2018-12-25 DIAGNOSIS — G5602 Carpal tunnel syndrome, left upper limb: Secondary | ICD-10-CM | POA: Diagnosis not present

## 2018-12-31 DIAGNOSIS — G5602 Carpal tunnel syndrome, left upper limb: Secondary | ICD-10-CM | POA: Diagnosis not present

## 2019-01-03 DIAGNOSIS — G5602 Carpal tunnel syndrome, left upper limb: Secondary | ICD-10-CM | POA: Diagnosis not present

## 2019-01-07 DIAGNOSIS — G5602 Carpal tunnel syndrome, left upper limb: Secondary | ICD-10-CM | POA: Diagnosis not present

## 2019-01-15 DIAGNOSIS — Z9889 Other specified postprocedural states: Secondary | ICD-10-CM | POA: Diagnosis not present

## 2019-01-20 DIAGNOSIS — S6412XD Injury of median nerve at wrist and hand level of left arm, subsequent encounter: Secondary | ICD-10-CM | POA: Diagnosis not present

## 2019-01-23 DIAGNOSIS — S6412XD Injury of median nerve at wrist and hand level of left arm, subsequent encounter: Secondary | ICD-10-CM | POA: Diagnosis not present

## 2019-01-29 DIAGNOSIS — S6412XD Injury of median nerve at wrist and hand level of left arm, subsequent encounter: Secondary | ICD-10-CM | POA: Diagnosis not present

## 2019-01-30 DIAGNOSIS — M7918 Myalgia, other site: Secondary | ICD-10-CM | POA: Diagnosis not present

## 2019-01-30 DIAGNOSIS — M5126 Other intervertebral disc displacement, lumbar region: Secondary | ICD-10-CM | POA: Diagnosis not present

## 2019-01-31 DIAGNOSIS — S6412XD Injury of median nerve at wrist and hand level of left arm, subsequent encounter: Secondary | ICD-10-CM | POA: Diagnosis not present

## 2019-02-05 DIAGNOSIS — Z9889 Other specified postprocedural states: Secondary | ICD-10-CM | POA: Diagnosis not present

## 2019-02-05 DIAGNOSIS — S6412XD Injury of median nerve at wrist and hand level of left arm, subsequent encounter: Secondary | ICD-10-CM | POA: Diagnosis not present

## 2019-02-11 DIAGNOSIS — S6412XD Injury of median nerve at wrist and hand level of left arm, subsequent encounter: Secondary | ICD-10-CM | POA: Diagnosis not present

## 2019-02-13 DIAGNOSIS — S6412XD Injury of median nerve at wrist and hand level of left arm, subsequent encounter: Secondary | ICD-10-CM | POA: Diagnosis not present

## 2019-03-19 DIAGNOSIS — Z9889 Other specified postprocedural states: Secondary | ICD-10-CM | POA: Diagnosis not present

## 2019-03-24 DIAGNOSIS — E1122 Type 2 diabetes mellitus with diabetic chronic kidney disease: Secondary | ICD-10-CM | POA: Diagnosis not present

## 2019-03-24 DIAGNOSIS — E785 Hyperlipidemia, unspecified: Secondary | ICD-10-CM | POA: Diagnosis not present

## 2019-03-24 DIAGNOSIS — E039 Hypothyroidism, unspecified: Secondary | ICD-10-CM | POA: Diagnosis not present

## 2019-03-31 DIAGNOSIS — E039 Hypothyroidism, unspecified: Secondary | ICD-10-CM | POA: Diagnosis not present

## 2019-03-31 DIAGNOSIS — R609 Edema, unspecified: Secondary | ICD-10-CM | POA: Diagnosis not present

## 2019-03-31 DIAGNOSIS — N182 Chronic kidney disease, stage 2 (mild): Secondary | ICD-10-CM | POA: Diagnosis not present

## 2019-03-31 DIAGNOSIS — E785 Hyperlipidemia, unspecified: Secondary | ICD-10-CM | POA: Diagnosis not present

## 2019-03-31 DIAGNOSIS — E1122 Type 2 diabetes mellitus with diabetic chronic kidney disease: Secondary | ICD-10-CM | POA: Diagnosis not present

## 2019-04-03 DIAGNOSIS — M7918 Myalgia, other site: Secondary | ICD-10-CM | POA: Diagnosis not present

## 2019-04-03 DIAGNOSIS — M76822 Posterior tibial tendinitis, left leg: Secondary | ICD-10-CM | POA: Diagnosis not present

## 2019-04-03 DIAGNOSIS — M5126 Other intervertebral disc displacement, lumbar region: Secondary | ICD-10-CM | POA: Diagnosis not present

## 2019-04-25 DIAGNOSIS — M25572 Pain in left ankle and joints of left foot: Secondary | ICD-10-CM | POA: Diagnosis not present

## 2019-05-06 DIAGNOSIS — K5909 Other constipation: Secondary | ICD-10-CM | POA: Diagnosis not present

## 2019-05-06 DIAGNOSIS — Z1211 Encounter for screening for malignant neoplasm of colon: Secondary | ICD-10-CM | POA: Diagnosis not present

## 2019-05-06 DIAGNOSIS — K219 Gastro-esophageal reflux disease without esophagitis: Secondary | ICD-10-CM | POA: Diagnosis not present

## 2019-05-07 DIAGNOSIS — M76821 Posterior tibial tendinitis, right leg: Secondary | ICD-10-CM | POA: Diagnosis not present

## 2019-05-09 ENCOUNTER — Ambulatory Visit: Payer: Self-pay | Admitting: Cardiology

## 2019-05-16 DIAGNOSIS — Z86010 Personal history of colonic polyps: Secondary | ICD-10-CM | POA: Diagnosis not present

## 2019-05-16 DIAGNOSIS — K5904 Chronic idiopathic constipation: Secondary | ICD-10-CM | POA: Diagnosis not present

## 2019-05-16 DIAGNOSIS — Z1211 Encounter for screening for malignant neoplasm of colon: Secondary | ICD-10-CM | POA: Diagnosis not present

## 2019-06-03 DIAGNOSIS — E1122 Type 2 diabetes mellitus with diabetic chronic kidney disease: Secondary | ICD-10-CM | POA: Diagnosis not present

## 2019-06-03 DIAGNOSIS — E039 Hypothyroidism, unspecified: Secondary | ICD-10-CM | POA: Diagnosis not present

## 2019-06-12 DIAGNOSIS — M5126 Other intervertebral disc displacement, lumbar region: Secondary | ICD-10-CM | POA: Diagnosis not present

## 2019-06-12 DIAGNOSIS — M76822 Posterior tibial tendinitis, left leg: Secondary | ICD-10-CM | POA: Diagnosis not present

## 2019-06-25 DIAGNOSIS — L74519 Primary focal hyperhidrosis, unspecified: Secondary | ICD-10-CM | POA: Diagnosis not present

## 2019-06-25 DIAGNOSIS — E039 Hypothyroidism, unspecified: Secondary | ICD-10-CM | POA: Diagnosis not present

## 2019-06-25 DIAGNOSIS — E1122 Type 2 diabetes mellitus with diabetic chronic kidney disease: Secondary | ICD-10-CM | POA: Diagnosis not present

## 2019-07-02 ENCOUNTER — Telehealth: Payer: Self-pay | Admitting: Orthopaedic Surgery

## 2019-07-02 NOTE — Telephone Encounter (Signed)
Yes you can make her an appt to followup on this sometime next week

## 2019-07-02 NOTE — Telephone Encounter (Signed)
Patient had hip replacement surgery with Dr. Ninfa Linden.  She recently tripped over exercise equipment and now her hip is giving out.  She is wanting to know if Dr. Ninfa Linden would want to see her again or what should she do.  CB#[phone]

## 2019-07-08 ENCOUNTER — Encounter: Payer: Self-pay | Admitting: Orthopaedic Surgery

## 2019-07-08 ENCOUNTER — Ambulatory Visit (INDEPENDENT_AMBULATORY_CARE_PROVIDER_SITE_OTHER): Payer: Medicare Other

## 2019-07-08 ENCOUNTER — Ambulatory Visit (INDEPENDENT_AMBULATORY_CARE_PROVIDER_SITE_OTHER): Payer: Medicare Other | Admitting: Orthopaedic Surgery

## 2019-07-08 DIAGNOSIS — M25551 Pain in right hip: Secondary | ICD-10-CM

## 2019-07-08 DIAGNOSIS — M7061 Trochanteric bursitis, right hip: Secondary | ICD-10-CM

## 2019-07-08 MED ORDER — LIDOCAINE HCL 1 % IJ SOLN
3.0000 mL | INTRAMUSCULAR | Status: AC | PRN
  Administered 2019-07-08: 3 mL

## 2019-07-08 MED ORDER — METHYLPREDNISOLONE ACETATE 40 MG/ML IJ SUSP
40.0000 mg | INTRAMUSCULAR | Status: AC | PRN
  Administered 2019-07-08: 10:00:00 40 mg via INTRA_ARTICULAR

## 2019-07-08 NOTE — Progress Notes (Signed)
Office Visit Note   Patient: Kelly Taylor           Date of Birth: 22-Mar-1951           MRN: [id number] Visit Date: 07/08/2019              Requested by: No referring provider defined for this encounter. PCP: Thayer HeadingsMackenzie, Brian, MD (Inactive)   Assessment & Plan: Visit Diagnoses:  1. Pain in right hip     Plan: She will follow-up with her neurosurgeon for her back.  We will see her back as needed for her hip trochanteric bursitis.  IT band stretching exercises were shown.  Questions were encouraged and answered.  Follow-Up Instructions: Return if symptoms worsen or fail to improve.   Orders:  Orders Placed This Encounter  Procedures  . Large Joint Inj: R greater trochanter  . XR HIP UNILAT W OR W/O PELVIS 1V RIGHT   No orders of the defined types were placed in this encounter.     Procedures: Large Joint Inj: R greater trochanter on 07/08/2019 9:37 AM Indications: pain Details: 22 G 1.5 in needle, lateral approach  Arthrogram: No  Medications: 3 mL lidocaine 1 %; 40 mg methylPREDNISolone acetate 40 MG/ML Outcome: tolerated well, no immediate complications Procedure, treatment alternatives, risks and benefits explained, specific risks discussed. Consent was given by the patient. Immediately prior to procedure a time out was called to verify the correct patient, procedure, equipment, support staff and site/side marked as required. Patient was prepped and draped in the usual sterile fashion.       Clinical Data: No additional findings.   Subjective: Chief Complaint  Patient presents with  . Right Hip - Pain    HPI Kelly Taylor comes in today for right hip pain since having a fall 2 weeks ago.  She was standing up from a machine at 1 of her work-ups and got her left ankle caught around the equipment and fell forward.  She is having right lumbar paraspinous pain and right hip pain laterally.  No groin pain.  No numbness tingling down either leg.  She did contact her  neurosurgeon and was told that she needed follow-up with us first to make sure that there is nothing going on with her hip.  Review of Systems No fevers chills shortness breath chest pain please see HPI otherwise negative  Objective: Vital Signs: There were no vitals taken for this visit.  Physical Exam Constitutional:      Appearance: She is obese. She is not ill-appearing or diaphoretic.  Pulmonary:     Effort: Pulmonary effort is normal.  Neurological:     Mental Status: She is alert and oriented to person, place, and time.  Psychiatric:        Mood and Affect: Mood normal.     Ortho Exam Patient has a difficult time getting around the room and is using a rolling walker with a seat.  She has good range of motion both hips without pain.  Tenderness over the right hip trochanteric region.  Tenderness over the right lower lumbar paraspinous region. Specialty Comments:  No specialty comments available.  Imaging: Xr Hip Unilat W Or W/o Pelvis 1v Right  Result Date: 07/08/2019 AP pelvis and lateral right hip : No acute fracture. Right total hip arthroplasty components well-seated.  No bony abnormalities otherwise.  Both hips are well located.    PMFS History: Patient Active Problem List   Diagnosis Date Noted  . Arthritis  08/03/2017  . Disease of thyroid gland 08/03/2017  . GERD (gastroesophageal reflux disease) 08/03/2017  . IBS (irritable bowel syndrome) 08/03/2017  . Pain of breast 08/03/2017  . Urge incontinence 08/03/2017  . Shortness of breath 08/03/2017  . S/P reverse total shoulder arthroplasty, left 04/19/2017  . Trochanteric bursitis, right hip 03/28/2017  . Arthritis of right hip 07/30/2014  . Status post total replacement of right hip 07/30/2014  . Postoperative anemia due to acute blood loss 04/08/2013  . OA (osteoarthritis) of knee 04/07/2013  . Breast lump 11/20/2001   Past Medical History:  Diagnosis Date  . Anemia    hx of in 20's  . Arthritis   .  Asthma   . Breast mass, left 2003  . Chronic back pain    seeing Pain specialist  . Complication of anesthesia    "shallow breathing with general"  . DDD (degenerative disc disease)   . Diabetes mellitus    borderline  . DJD (degenerative joint disease)   . GERD (gastroesophageal reflux disease)   . H/O scarlet fever   . Hemorrhoids   . History of chicken pox   . History of measles, mumps, or rubella as child   all 3  . Hx of bladder infections   . Hypothyroidism   . Irritable bowel syndrome   . Lumbar stenosis    RFA done May 2015  . Menopausal symptoms 2003  . Obesity, morbid (HCC) 2006  . Pericarditis    hx of  . Pneumonia    hx of 5 years ago  . Polyp of colon    removed during colonoscopy  . Spinal headache    x1  . Trichomonas   . Urge incontinence 2005  . Vertigo    over a year was last episode  . Yeast in stool     Family History  Problem Relation Age of Onset  . Lung cancer Mother   . Stroke Paternal Grandfather   . Ovarian cancer Maternal Aunt   . Breast cancer Maternal Aunt     Past Surgical History:  Procedure Laterality Date  . ABDOMINAL HYSTERECTOMY  02/1990  . APPENDECTOMY  age 68  . BLADDER SUSPENSION    . BREAST LUMPECTOMY Left 2003  . BREAST REDUCTION SURGERY  1992  . CHOLECYSTECTOMY  2010  . COLONOSCOPY  may 2015   4 polyps-benign  . DILATION AND CURETTAGE OF UTERUS    . FOOT SURGERY Right    pinched nerve  . HAND SURGERY Right    right-Dr. Amanda PeaGramig  . HERNIA REPAIR Right 1992  . INTERSTIM IMPLANT PLACEMENT Right 2003   for incontinence  . KNEE SURGERY     bilateral knee; x3 right/ x2 left  . REVERSE SHOULDER ARTHROPLASTY Left 04/19/2017   Procedure: REVERSE SHOULDER ARTHROPLASTY;  Surgeon: Sargent Broomhandler, Justin, MD;  [facility];  Service: Orthopedics;  Laterality: Left;  REVERSE SHOULDER ARTHROPLASTY  . ROTATOR CUFF REPAIR  2008   right, almost complete reconstruction, bone anchors  . TONSILLECTOMY  age 68  . TOTAL HIP ARTHROPLASTY  Right 07/30/2014   Procedure: RIGHT TOTAL HIP ARTHROPLASTY ANTERIOR APPROACH;  Surgeon: Kathryne Hitchhristopher Y Blackman, MD;  Location: [facility] ORS;  Service: Orthopedics;  Laterality: Right;  . TOTAL KNEE ARTHROPLASTY Right 04/07/2013   Procedure: RIGHT TOTAL KNEE ARTHROPLASTY;  Surgeon: Loanne DrillingFrank V Aluisio, MD;  Location: [facility] ORS;  Service: Orthopedics;  Laterality: Right;  . TOTAL KNEE ARTHROPLASTY Left 09/22/2013   Procedure: LEFT TOTAL KNEE ARTHROPLASTY;  Surgeon: Homero FellersFrank  Zella Ball, MD;  Location: [facility] ORS;  Service: Orthopedics;  Laterality: Left;   Social History   Occupational History  . Not on file  Tobacco Use  . Smoking status: Never Smoker  . Smokeless tobacco: Never Used  Substance and Sexual Activity  . Alcohol use: Yes    Comment: 1 or 2 glasses a week of wine  . Drug use: No  . Sexual activity: Yes    Birth control/protection: Surgical    Comment: hyst

## 2019-07-09 DIAGNOSIS — M48061 Spinal stenosis, lumbar region without neurogenic claudication: Secondary | ICD-10-CM | POA: Diagnosis not present

## 2019-07-09 DIAGNOSIS — Z6841 Body Mass Index (BMI) 40.0 and over, adult: Secondary | ICD-10-CM | POA: Diagnosis not present

## 2019-07-09 DIAGNOSIS — M5126 Other intervertebral disc displacement, lumbar region: Secondary | ICD-10-CM | POA: Diagnosis not present

## 2019-07-09 DIAGNOSIS — M7918 Myalgia, other site: Secondary | ICD-10-CM | POA: Diagnosis not present

## 2019-07-09 DIAGNOSIS — I10 Essential (primary) hypertension: Secondary | ICD-10-CM | POA: Diagnosis not present

## 2019-07-24 DIAGNOSIS — H00025 Hordeolum internum left lower eyelid: Secondary | ICD-10-CM | POA: Diagnosis not present

## 2019-08-08 DIAGNOSIS — Z23 Encounter for immunization: Secondary | ICD-10-CM | POA: Diagnosis not present

## 2019-08-28 ENCOUNTER — Ambulatory Visit: Payer: Medicare Other | Admitting: Podiatry

## 2019-09-03 ENCOUNTER — Ambulatory Visit (INDEPENDENT_AMBULATORY_CARE_PROVIDER_SITE_OTHER): Payer: Medicare Other

## 2019-09-03 ENCOUNTER — Ambulatory Visit (INDEPENDENT_AMBULATORY_CARE_PROVIDER_SITE_OTHER): Payer: Medicare Other | Admitting: Podiatry

## 2019-09-03 ENCOUNTER — Other Ambulatory Visit: Payer: Self-pay | Admitting: Podiatry

## 2019-09-03 ENCOUNTER — Other Ambulatory Visit: Payer: Self-pay

## 2019-09-03 ENCOUNTER — Encounter

## 2019-09-03 DIAGNOSIS — Z23 Encounter for immunization: Secondary | ICD-10-CM | POA: Diagnosis not present

## 2019-09-03 DIAGNOSIS — M76822 Posterior tibial tendinitis, left leg: Secondary | ICD-10-CM

## 2019-09-03 DIAGNOSIS — M79672 Pain in left foot: Secondary | ICD-10-CM

## 2019-09-03 DIAGNOSIS — M778 Other enthesopathies, not elsewhere classified: Secondary | ICD-10-CM | POA: Diagnosis not present

## 2019-09-03 MED ORDER — METHYLPREDNISOLONE 4 MG PO TBPK: ORAL_TABLET | ORAL | 0 refills | Status: DC

## 2019-09-07 NOTE — Progress Notes (Signed)
    HPI: 68 y.o. female presenting today as a new patient with a chief complaint of pain and swelling to the left dorsal foot that began two months ago after a fall. She was seen by her PCP and was given a boot which did not help. She has been taking Celebrex for treatment. Walking and being on the foot increases the pain. Patient is here for further evaluation and treatment.    Past Medical History:  Diagnosis Date  . Anemia    hx of in 20's  . Arthritis   . Asthma   . Breast mass, left 2003  . Chronic back pain    seeing Pain specialist  . Complication of anesthesia    "shallow breathing with general"  . DDD (degenerative disc disease)   . Diabetes mellitus    borderline  . DJD (degenerative joint disease)   . GERD (gastroesophageal reflux disease)   . H/O scarlet fever   . Hemorrhoids   . History of chicken pox   . History of measles, mumps, or rubella as child   all 3  . Hx of bladder infections   . Hypothyroidism   . Irritable bowel syndrome   . Lumbar stenosis    RFA done May 2015  . Menopausal symptoms 2003  . Obesity, morbid ([place]) 2006  . Pericarditis    hx of  . Pneumonia    hx of 5 years ago  . Polyp of colon    removed during colonoscopy  . Spinal headache    x1  . Trichomonas   . Urge incontinence 2005  . Vertigo    over a year was last episode  . Yeast in stool        Physical Exam: General: The patient is alert and oriented x3 in no acute distress.  Dermatology: Skin is warm, dry and supple bilateral lower extremities. Negative for open lesions or macerations.  Vascular: Palpable pedal pulses bilaterally. No edema or erythema noted. Capillary refill within normal limits.  Neurological: Epicritic and protective threshold grossly intact bilaterally.   Musculoskeletal Exam: Pain on palpation noted to the posterior tibial tendon of the left foot as well as to the left midfoot. Range of motion within normal limits. Muscle strength 5/5 in all muscle  groups bilateral lower extremities.  Radiographic Exam:  Degenerative changes with joint space narrowing noted to the left midfoot.    Assessment: 1. Posterior tibial tendinitis left 2. Midfoot capsulitis / DJD left    Plan of Care:  1. Patient was evaluated. Radiographs were reviewed today. 2. Injection of 0.5 mL Celestone Soluspan injected into the posterior tibial tendon sheath.  3. Injection of 0.5 mLs Celestone Soluspan injected into the left midfoot.  4. Prescription for Medrol Dose Pak provided to patient. Then continue taking Celebrex as directed by PCP.  5. Ankle brace dispensed.  6. Return to clinic in 4 weeks.    Edrick Kins, DPM [facility]  Dr. Edrick Kins, [address]                                        [place], [place] [address]                Office [phone]  Fax [phone]

## 2019-09-11 DIAGNOSIS — M5126 Other intervertebral disc displacement, lumbar region: Secondary | ICD-10-CM | POA: Diagnosis not present

## 2019-09-11 DIAGNOSIS — M84375A Stress fracture, left foot, initial encounter for fracture: Secondary | ICD-10-CM | POA: Diagnosis not present

## 2019-09-11 DIAGNOSIS — Z79899 Other long term (current) drug therapy: Secondary | ICD-10-CM | POA: Diagnosis not present

## 2019-09-19 DIAGNOSIS — Z96641 Presence of right artificial hip joint: Secondary | ICD-10-CM | POA: Diagnosis not present

## 2019-09-19 DIAGNOSIS — Z96612 Presence of left artificial shoulder joint: Secondary | ICD-10-CM | POA: Diagnosis not present

## 2019-09-19 DIAGNOSIS — M5126 Other intervertebral disc displacement, lumbar region: Secondary | ICD-10-CM | POA: Diagnosis not present

## 2019-09-19 DIAGNOSIS — M79672 Pain in left foot: Secondary | ICD-10-CM | POA: Diagnosis not present

## 2019-09-19 DIAGNOSIS — Z96653 Presence of artificial knee joint, bilateral: Secondary | ICD-10-CM | POA: Diagnosis not present

## 2019-09-24 ENCOUNTER — Telehealth: Payer: Self-pay | Admitting: Podiatry

## 2019-09-24 DIAGNOSIS — E1122 Type 2 diabetes mellitus with diabetic chronic kidney disease: Secondary | ICD-10-CM | POA: Diagnosis not present

## 2019-09-24 DIAGNOSIS — E039 Hypothyroidism, unspecified: Secondary | ICD-10-CM | POA: Diagnosis not present

## 2019-09-24 DIAGNOSIS — E785 Hyperlipidemia, unspecified: Secondary | ICD-10-CM | POA: Diagnosis not present

## 2019-09-24 NOTE — Telephone Encounter (Signed)
Called and left a message for the report to be faxed to my attention at [phone] and to call and let me know if they are able to send the patients images.

## 2019-09-24 NOTE — Telephone Encounter (Signed)
Request was faxed for disc of images and dictated report.

## 2019-09-24 NOTE — Telephone Encounter (Signed)
Kelly Taylor with [facility] Radiology Department called. She is requesting something be sent via fax on letter head of what we need with patients information. Then they  will mail the image disc to our office. Can be faxed to [phone]. Any questions, call [phone].

## 2019-09-24 NOTE — Telephone Encounter (Signed)
Pt was seen at [facility] for a bone scan due to her not being able to walk properly.Kelly Taylor She would like our office to request a copy so that Dr. Amalia Hailey can have it for her appointment on 10/01/2019 at 04:15. She called them and they stated that we can request it.   [facility] [phone]

## 2019-10-01 ENCOUNTER — Other Ambulatory Visit: Payer: Self-pay

## 2019-10-01 ENCOUNTER — Ambulatory Visit (INDEPENDENT_AMBULATORY_CARE_PROVIDER_SITE_OTHER): Payer: Medicare Other | Admitting: Podiatry

## 2019-10-01 DIAGNOSIS — N182 Chronic kidney disease, stage 2 (mild): Secondary | ICD-10-CM | POA: Diagnosis not present

## 2019-10-01 DIAGNOSIS — M778 Other enthesopathies, not elsewhere classified: Secondary | ICD-10-CM

## 2019-10-01 DIAGNOSIS — E039 Hypothyroidism, unspecified: Secondary | ICD-10-CM | POA: Diagnosis not present

## 2019-10-01 DIAGNOSIS — M76822 Posterior tibial tendinitis, left leg: Secondary | ICD-10-CM

## 2019-10-01 DIAGNOSIS — E785 Hyperlipidemia, unspecified: Secondary | ICD-10-CM | POA: Diagnosis not present

## 2019-10-01 DIAGNOSIS — E1140 Type 2 diabetes mellitus with diabetic neuropathy, unspecified: Secondary | ICD-10-CM | POA: Diagnosis not present

## 2019-10-01 DIAGNOSIS — E1122 Type 2 diabetes mellitus with diabetic chronic kidney disease: Secondary | ICD-10-CM | POA: Diagnosis not present

## 2019-10-01 NOTE — Progress Notes (Signed)
HPI: 68 y.o. female presents today for follow-up evaluation regarding left foot and ankle pain.  Patient had a bone scan performed by her spine specialist which indicated hotspots to her bilateral feet and frankly throughout her entire body.  Patient continues to complain of pain and tenderness to the left foot and ankle.  She says that the injection she received last visit did not help as well as the ankle brace and the Celebrex.  She is also tried an immobilization cam boot x3 weeks which is not helped.  All have been ineffective in alleviating her pain.  She presents for further treatment evaluation   Past Medical History:  Diagnosis Date  . Anemia    hx of in 20's  . Arthritis   . Asthma   . Breast mass, left 2003  . Chronic back pain    seeing Pain specialist  . Complication of anesthesia    "shallow breathing with general"  . DDD (degenerative disc disease)   . Diabetes mellitus    borderline  . DJD (degenerative joint disease)   . GERD (gastroesophageal reflux disease)   . H/O scarlet fever   . Hemorrhoids   . History of chicken pox   . History of measles, mumps, or rubella as child   all 3  . Hx of bladder infections   . Hypothyroidism   . Irritable bowel syndrome   . Lumbar stenosis    RFA done May 2015  . Menopausal symptoms 2003  . Obesity, morbid ([place]) 2006  . Pericarditis    hx of  . Pneumonia    hx of 5 years ago  . Polyp of colon    removed during colonoscopy  . Spinal headache    x1  . Trichomonas   . Urge incontinence 2005  . Vertigo    over a year was last episode  . Yeast in stool        Physical Exam: General: The patient is alert and oriented x3 in no acute distress.  Dermatology: Skin is warm, dry and supple bilateral lower extremities. Negative for open lesions or macerations.  Vascular: Palpable pedal pulses bilaterally. No edema or erythema noted. Capillary refill within normal limits.  Neurological: Epicritic and protective  threshold grossly intact bilaterally.   Musculoskeletal Exam: Pain on palpation noted to the posterior tibial tendon of the left foot as well as to the left midfoot. Range of motion within normal limits. Muscle strength 5/5 in all muscle groups bilateral lower extremities.  Radiographic Exam:  Degenerative changes with joint space narrowing noted to the left midfoot.    Assessment: 1. Posterior tibial tendinitis left/possible tendon tear 2. Midfoot capsulitis / DJD left    Plan of Care:  1. Patient was evaluated.  Bone scan was reviewed today. 2.  Today we are going to order a CT with contrast.  The patient cannot have an MRI due to a bladder control implant stent. 3.  Continue Celebrex as needed. 4.  Compression anklet dispensed today.  Continue wearing the ankle brace as needed.  Patient does complain of the ankle brace is very cumbersome. 5.  Return to clinic after CT scan to review the results and discuss further treatment options  Edrick Kins, DPM [facility]  Dr. Edrick Kins, [address]  [place], [place] [address]                Office [phone]  Fax [phone]   

## 2019-10-02 ENCOUNTER — Telehealth: Payer: Self-pay | Admitting: *Deleted

## 2019-10-02 DIAGNOSIS — T148XXA Other injury of unspecified body region, initial encounter: Secondary | ICD-10-CM

## 2019-10-02 DIAGNOSIS — M76822 Posterior tibial tendinitis, left leg: Secondary | ICD-10-CM

## 2019-10-02 NOTE — Telephone Encounter (Signed)
Faxed CT orders to [facility] and given to Dr. Amalia Hailey assistant for pre-cert.

## 2019-10-02 NOTE — Telephone Encounter (Signed)
-----   Message from Edrick Kins, DPM sent at 10/01/2019  5:36 PM EST ----- Regarding: CT w/ contrast Please order CT with contrast left ankle.  Patient cannot have an MRI due to bladder control stent implant.   Diagnosis: Possible PT tendon tear left  Thanks, Dr. Amalia Hailey

## 2019-10-09 DIAGNOSIS — M5126 Other intervertebral disc displacement, lumbar region: Secondary | ICD-10-CM | POA: Diagnosis not present

## 2019-10-14 NOTE — Telephone Encounter (Signed)
Kelly Taylor, CMA states [employer] says pt is no longer a member. Faxed orders to [facility].

## 2019-10-20 ENCOUNTER — Other Ambulatory Visit: Payer: Self-pay | Admitting: Podiatry

## 2019-10-20 ENCOUNTER — Ambulatory Visit
Admission: RE | Admit: 2019-10-20 | Discharge: 2019-10-20 | Disposition: A | Discharge status: Home / Self Care | Payer: Medicare Other | Source: Ambulatory Visit | Attending: Podiatry | Admitting: Podiatry

## 2019-10-20 DIAGNOSIS — M76822 Posterior tibial tendinitis, left leg: Secondary | ICD-10-CM

## 2019-10-20 DIAGNOSIS — M19072 Primary osteoarthritis, left ankle and foot: Secondary | ICD-10-CM | POA: Diagnosis not present

## 2019-10-20 DIAGNOSIS — T148XXA Other injury of unspecified body region, initial encounter: Secondary | ICD-10-CM

## 2019-10-20 MED ORDER — IOPAMIDOL (ISOVUE-300) INJECTION 61%
100.0000 mL | Freq: Once | INTRAVENOUS | Status: AC | PRN
  Administered 2019-10-20: 100 mL via INTRAVENOUS

## 2019-10-24 DIAGNOSIS — E1140 Type 2 diabetes mellitus with diabetic neuropathy, unspecified: Secondary | ICD-10-CM | POA: Diagnosis not present

## 2019-10-24 DIAGNOSIS — M4726 Other spondylosis with radiculopathy, lumbar region: Secondary | ICD-10-CM | POA: Diagnosis not present

## 2019-10-24 DIAGNOSIS — E1122 Type 2 diabetes mellitus with diabetic chronic kidney disease: Secondary | ICD-10-CM | POA: Diagnosis not present

## 2019-10-24 DIAGNOSIS — R609 Edema, unspecified: Secondary | ICD-10-CM | POA: Diagnosis not present

## 2019-10-24 DIAGNOSIS — M7918 Myalgia, other site: Secondary | ICD-10-CM | POA: Diagnosis not present

## 2019-10-24 DIAGNOSIS — M48062 Spinal stenosis, lumbar region with neurogenic claudication: Secondary | ICD-10-CM | POA: Diagnosis not present

## 2019-10-27 ENCOUNTER — Other Ambulatory Visit: Payer: Self-pay

## 2019-10-27 ENCOUNTER — Ambulatory Visit (INDEPENDENT_AMBULATORY_CARE_PROVIDER_SITE_OTHER): Payer: Medicare Other | Admitting: Podiatry

## 2019-10-27 DIAGNOSIS — M19072 Primary osteoarthritis, left ankle and foot: Secondary | ICD-10-CM

## 2019-10-27 DIAGNOSIS — M76822 Posterior tibial tendinitis, left leg: Secondary | ICD-10-CM | POA: Diagnosis not present

## 2019-10-27 DIAGNOSIS — T148XXA Other injury of unspecified body region, initial encounter: Secondary | ICD-10-CM

## 2019-10-27 NOTE — Patient Instructions (Signed)
Pre-Operative Instructions  Congratulations, you have decided to take an important step towards improving your quality of life.  You can be assured that the doctors and staff at [facility] will be with you every step of the way.  Here are some important things you should know:  1. Plan to be at the surgery center/hospital at least 1 (one) hour prior to your scheduled time, unless otherwise directed by the surgical center/hospital staff.  You must have a responsible adult accompany you, remain during the surgery and drive you home.  Make sure you have directions to the surgical center/hospital to ensure you arrive on time. 2. If you are having surgery at [facility] or [facility] hospitals, you will need a copy of your medical history and physical form from your family physician within one month prior to the date of surgery. We will give you a form for your primary physician to complete.  3. We make every effort to accommodate the date you request for surgery.  However, there are times where surgery dates or times have to be moved.  We will contact you as soon as possible if a change in schedule is required.   4. No aspirin/ibuprofen for one week before surgery.  If you are on aspirin, any non-steroidal anti-inflammatory medications (Mobic, Aleve, Ibuprofen) should not be taken seven (7) days prior to your surgery.  You make take Tylenol for pain prior to surgery.  5. Medications - If you are taking daily heart and blood pressure medications, seizure, reflux, allergy, asthma, anxiety, pain or diabetes medications, make sure you notify the surgery center/hospital before the day of surgery so they can tell you which medications you should take or avoid the day of surgery. 6. No food or drink after midnight the night before surgery unless directed otherwise by surgical center/hospital staff. 7. No alcoholic beverages 24-hours prior to surgery.  No smoking 24-hours prior or 24-hours after  surgery. 8. Wear loose pants or shorts. They should be loose enough to fit over bandages, boots, and casts. 9. Don't wear slip-on shoes. Sneakers are preferred. 10. Bring your boot with you to the surgery center/hospital.  Also bring crutches or a walker if your physician has prescribed it for you.  If you do not have this equipment, it will be provided for you after surgery. 11. If you have not been contacted by the surgery center/hospital by the day before your surgery, call to confirm the date and time of your surgery. 12. Leave-time from work may vary depending on the type of surgery you have.  Appropriate arrangements should be made prior to surgery with your employer. 13. Prescriptions will be provided immediately following surgery by your doctor.  Fill these as soon as possible after surgery and take the medication as directed. Pain medications will not be refilled on weekends and must be approved by the doctor. 14. Remove nail polish on the operative foot and avoid getting pedicures prior to surgery. 15. Wash the night before surgery.  The night before surgery wash the foot and leg well with water and the antibacterial soap provided. Be sure to pay special attention to beneath the toenails and in between the toes.  Wash for at least three (3) minutes. Rinse thoroughly with water and dry well with a towel.  Perform this wash unless told not to do so by your physician.  Enclosed: 1 Ice pack (please put in freezer the night before surgery)   1 Hibiclens skin cleaner     Pre-op instructions  If you have any questions regarding the instructions, please do not hesitate to call our office.  [place]: [address], [place], [place] [address] -- [phone]  [place]: [address] , [place] [address] -- [phone]  [place]: [address]  [place], [place] [address] -- [phone]   Website: [email or web address] 

## 2019-10-29 NOTE — Progress Notes (Signed)
    HPI: 69 y.o. female presents today for follow-up evaluation regarding left foot and ankle pain. She reports the pain is relatively unchanged since her last visit. She discontinued taking Lyrica because it caused her to have nightmares. Walking and being on the foot increases the pain. There are no alleviating factors noted. Patient is here for further evaluation and treatment.   Past Medical History:  Diagnosis Date  . Anemia    hx of in 20's  . Arthritis   . Asthma   . Breast mass, left 2003  . Chronic back pain    seeing Pain specialist  . Complication of anesthesia    "shallow breathing with general"  . DDD (degenerative disc disease)   . Diabetes mellitus    borderline  . DJD (degenerative joint disease)   . GERD (gastroesophageal reflux disease)   . H/O scarlet fever   . Hemorrhoids   . History of chicken pox   . History of measles, mumps, or rubella as child   all 3  . Hx of bladder infections   . Hypothyroidism   . Irritable bowel syndrome   . Lumbar stenosis    RFA done May 2015  . Menopausal symptoms 2003  . Obesity, morbid ([place]) 2006  . Pericarditis    hx of  . Pneumonia    hx of 5 years ago  . Polyp of colon    removed during colonoscopy  . Spinal headache    x1  . Trichomonas   . Urge incontinence 2005  . Vertigo    over a year was last episode  . Yeast in stool        Physical Exam: General: The patient is alert and oriented x3 in no acute distress.  Dermatology: Skin is warm, dry and supple bilateral lower extremities. Negative for open lesions or macerations.  Vascular: Palpable pedal pulses bilaterally. No edema or erythema noted. Capillary refill within normal limits.  Neurological: Epicritic and protective threshold grossly intact bilaterally.   Musculoskeletal Exam: Pain on palpation noted to the posterior tibial tendon of the left foot as well as to the left midfoot. Range of motion within normal limits. Muscle strength 5/5 in all  muscle groups bilateral lower extremities.  Assessment: 1. Posterior tibial tendinitis left/possible tendon tear 2. Midfoot capsulitis / DJD left    Plan of Care:  1. Patient was evaluated.   2. Injection of 0.5 mLs Celestone Soluspan injected into the left posterior tibial tendon sheath.  3. Today we discussed the conservative versus surgical management of the presenting pathology. The patient opts for surgical management. All possible complications and details of the procedure were explained. All patient questions were answered. No guarantees were expressed or implied. 4. Authorization for surgery was initiated today. Surgery will consist of triple arthrodesis left.  5. Return to clinic one week post op.   Retired [employer] manager.    Edrick Kins, DPM [facility]  Dr. Edrick Kins, [address]                                        [place],  [address]                Office [phone]  Fax [phone]

## 2019-10-31 ENCOUNTER — Telehealth: Payer: Self-pay | Admitting: *Deleted

## 2019-10-31 NOTE — Telephone Encounter (Signed)
Pt called states she is to have surgery with Dr. Amalia Hailey on 12/25/2019, and would like a to the knee compression sock.

## 2019-11-03 ENCOUNTER — Telehealth: Payer: Self-pay | Admitting: *Deleted

## 2019-11-03 NOTE — Telephone Encounter (Signed)
Pt calling to follow up from previous message about compression socks/hose. Please give patient a call.

## 2019-11-03 NOTE — Telephone Encounter (Signed)
"  I am scheduled to have surgery on January 4 for a Triple Arthrodesis.  Will I need to have a Covid test as well?"  You will not need a Covid test.  I have Medicare and a Supplement but my deductible will start over in the new year.  Do I need to go ahead and pay that??  You do not need to pay [place] anything in advance.  We will file the claim first then bill you.  It depends on who files their claim first, the physician or the surgical center, that will determine who gets the deductible payment.  "I'd like to go ahead and pay it if I can."  Let me transfer you to Jocelyn Lamer in our billing department.  I transferred the call to [facility].

## 2019-11-04 NOTE — Telephone Encounter (Signed)
Left message for pt to leave a message with her concern or question and often I can call back with an answer.

## 2019-12-04 ENCOUNTER — Telehealth: Payer: Self-pay | Admitting: *Deleted

## 2019-12-04 NOTE — Telephone Encounter (Signed)
No, patient is okay to get a shot of toradol. Thanks, Dr. Logan Bores

## 2019-12-04 NOTE — Telephone Encounter (Signed)
Pt states her doctor wanted her to check if it was okay for her to get a shot of toradol in her back, if it would effect her surgery.

## 2019-12-05 NOTE — Telephone Encounter (Signed)
I informed pt of Dr. Logan Bores 12/04/2019 6:03pm recommendations.

## 2019-12-08 DIAGNOSIS — M791 Myalgia, unspecified site: Secondary | ICD-10-CM | POA: Diagnosis not present

## 2019-12-08 DIAGNOSIS — G894 Chronic pain syndrome: Secondary | ICD-10-CM | POA: Diagnosis not present

## 2019-12-08 DIAGNOSIS — M545 Low back pain: Secondary | ICD-10-CM | POA: Diagnosis not present

## 2019-12-08 DIAGNOSIS — M5126 Other intervertebral disc displacement, lumbar region: Secondary | ICD-10-CM | POA: Diagnosis not present

## 2019-12-08 DIAGNOSIS — M84375A Stress fracture, left foot, initial encounter for fracture: Secondary | ICD-10-CM | POA: Diagnosis not present

## 2019-12-15 ENCOUNTER — Telehealth: Payer: Self-pay | Admitting: *Deleted

## 2019-12-15 NOTE — Telephone Encounter (Signed)
I spoke with pt and she states she has surgery with Dr. Logan Bores 12/25/2019 and had a surgery boot she was wondering if she could take to the surgery to cut cost. I told her to bring it they could only say no. Pt states she received a toradol injection and it only lasted a few days, she has tramadol, gabapentin and muscle relaxers, and she take these for pain relief until the surgery and when would she need to stop.

## 2019-12-15 NOTE — Telephone Encounter (Signed)
Pt called and phone message was garbled, but she has an appt on 12/25/2019.

## 2019-12-16 NOTE — Telephone Encounter (Signed)
Discontinue meds day before surgery. Sure she can bring the boot

## 2019-12-16 NOTE — Telephone Encounter (Signed)
Left message informing pt of Dr. Logan Bores' statement to stop the day before surgery.

## 2019-12-22 ENCOUNTER — Other Ambulatory Visit: Payer: Self-pay | Admitting: *Deleted

## 2019-12-22 ENCOUNTER — Telehealth: Payer: Self-pay | Admitting: *Deleted

## 2019-12-22 DIAGNOSIS — T148XXA Other injury of unspecified body region, initial encounter: Secondary | ICD-10-CM

## 2019-12-22 DIAGNOSIS — M19072 Primary osteoarthritis, left ankle and foot: Secondary | ICD-10-CM

## 2019-12-22 DIAGNOSIS — M76822 Posterior tibial tendinitis, left leg: Secondary | ICD-10-CM

## 2019-12-22 NOTE — Telephone Encounter (Signed)
I am calling you in regards to the knee scooter that you're going to need for your surgery on Thursday.  "I already have one.  I borrowed one from a friend."  Okay great because I was calling to let you know that Medicare does not cover knee scooters.  "I kind of figured.  Thanks for letting me know."

## 2019-12-22 NOTE — Progress Notes (Signed)
Per Dr. Logan Bores, I placed an order for a knee scooter.  Kelly Taylor is scheduled for an outpatient surgery on 12/25/2019.  I requested assistance from Georgia Lopes with [employer] to get the knee scooter for the patient to use for ambulation.

## 2019-12-25 ENCOUNTER — Encounter: Payer: Self-pay | Admitting: Podiatry

## 2019-12-25 ENCOUNTER — Other Ambulatory Visit: Payer: Self-pay | Admitting: Podiatry

## 2019-12-25 DIAGNOSIS — K219 Gastro-esophageal reflux disease without esophagitis: Secondary | ICD-10-CM | POA: Diagnosis not present

## 2019-12-25 DIAGNOSIS — M19072 Primary osteoarthritis, left ankle and foot: Secondary | ICD-10-CM | POA: Diagnosis not present

## 2019-12-25 DIAGNOSIS — M2142 Flat foot [pes planus] (acquired), left foot: Secondary | ICD-10-CM | POA: Diagnosis not present

## 2019-12-25 DIAGNOSIS — M25572 Pain in left ankle and joints of left foot: Secondary | ICD-10-CM | POA: Diagnosis not present

## 2019-12-25 MED ORDER — HYDROMORPHONE HCL 4 MG PO TABS: 4.0000 mg | ORAL_TABLET | Freq: Four times a day (QID) | ORAL | 0 refills | Status: DC | PRN

## 2019-12-25 MED ORDER — CEPHALEXIN 500 MG PO CAPS: 500.0000 mg | ORAL_CAPSULE | Freq: Three times a day (TID) | ORAL | 0 refills | Status: DC

## 2019-12-25 NOTE — Progress Notes (Signed)
PRN postop 

## 2019-12-29 ENCOUNTER — Telehealth: Payer: Self-pay | Admitting: *Deleted

## 2019-12-29 ENCOUNTER — Ambulatory Visit: Payer: Medicare Other

## 2019-12-29 ENCOUNTER — Other Ambulatory Visit: Payer: Self-pay

## 2019-12-29 ENCOUNTER — Ambulatory Visit (INDEPENDENT_AMBULATORY_CARE_PROVIDER_SITE_OTHER): Payer: Medicare Other | Admitting: Podiatry

## 2019-12-29 ENCOUNTER — Other Ambulatory Visit: Payer: Self-pay | Admitting: Podiatry

## 2019-12-29 DIAGNOSIS — M79672 Pain in left foot: Secondary | ICD-10-CM

## 2019-12-29 DIAGNOSIS — M19072 Primary osteoarthritis, left ankle and foot: Secondary | ICD-10-CM

## 2019-12-29 DIAGNOSIS — Z9889 Other specified postprocedural states: Secondary | ICD-10-CM

## 2019-12-29 MED ORDER — OXYCODONE-ACETAMINOPHEN 5-325 MG PO TABS: 1.0000 | ORAL_TABLET | Freq: Four times a day (QID) | ORAL | 0 refills | Status: DC | PRN

## 2019-12-29 NOTE — Telephone Encounter (Signed)
Pt asked when Dr. Logan Bores is going to order the percocet with [facility] on [name].

## 2019-12-29 NOTE — Progress Notes (Signed)
PRN postop. Patient states she is known to tolerate Percocet 5/325mg 

## 2019-12-30 NOTE — Progress Notes (Signed)
   Subjective:  Patient presents today status post triple arthrodesis left. DOS: 12/25/2019. She states she is doing well overall. She reports using the wound vac as directed. She has been nonweightbearing with the CAM boot as instructed. She denies worsening factors at this time. Patient is here for further evaluation and treatment.    Past Medical History:  Diagnosis Date  . Anemia    hx of in 20's  . Arthritis   . Asthma   . Breast mass, left 2003  . Chronic back pain    seeing Pain specialist  . Complication of anesthesia    "shallow breathing with general"  . DDD (degenerative disc disease)   . Diabetes mellitus    borderline  . DJD (degenerative joint disease)   . GERD (gastroesophageal reflux disease)   . H/O scarlet fever   . Hemorrhoids   . History of chicken pox   . History of measles, mumps, or rubella as child   all 3  . Hx of bladder infections   . Hypothyroidism   . Irritable bowel syndrome   . Lumbar stenosis    RFA done May 2015  . Menopausal symptoms 2003  . Obesity, morbid (HCC) 2006  . Pericarditis    hx of  . Pneumonia    hx of 5 years ago  . Polyp of colon    removed during colonoscopy  . Spinal headache    x1  . Trichomonas   . Urge incontinence 2005  . Vertigo    over a year was last episode  . Yeast in stool       Objective/Physical Exam Neurovascular status intact.  Skin incisions appear to be well coapted with sutures and staples intact. No sign of infectious process noted. No dehiscence. No active bleeding noted. Moderate edema noted to the surgical extremity.  Assessment: 1. s/p triple arthrodesis left. DOS: 12/25/2019   Plan of Care:  1. Patient was evaluated.  2. Dressing changed. Keep clean, dry and intact for one week.  3. Continue nonweightbearing in CAM boot with knee scooter.  4. Return to clinic in one week.   Retired Lawyer.   Felecia Shelling, DPM [facility]  Dr. Felecia Shelling, DPM    [address]                                        [place], [place] [address]                Office [phone]  Fax [phone]

## 2019-12-30 NOTE — Telephone Encounter (Signed)
Pt called to ask that Dr.Evans please call pharmacist to refill her medication the pharmacisit will not fill script until Dr.Evans calls the pharmacy

## 2019-12-30 NOTE — Telephone Encounter (Signed)
[facility] is requesting an E-scribe Authorization to Photographer and Dilantin together. Please contact [phone])

## 2019-12-30 NOTE — Telephone Encounter (Signed)
I notified Robert at [facility] pharmacy to d/c the Dilaudid per Dr. Logan Bores, due to medication causing nausea/vomiting.  He will fill the Oxycodone and inform patient

## 2019-12-30 NOTE — Telephone Encounter (Signed)
Patient is returning call and is concerned that her brother could not pick up prescription (OxyCodone- Ace.5-325 mg) and Pharmacist is questioning her about the Dilantin that was just ordered which (made her sick after taking). Returned call back to patient and explained that she should call them and explain the reasons. Patient verbally understood and said that she would call them back.

## 2020-01-07 ENCOUNTER — Ambulatory Visit (INDEPENDENT_AMBULATORY_CARE_PROVIDER_SITE_OTHER): Payer: Medicare Other

## 2020-01-07 ENCOUNTER — Other Ambulatory Visit: Payer: Self-pay

## 2020-01-07 ENCOUNTER — Ambulatory Visit (INDEPENDENT_AMBULATORY_CARE_PROVIDER_SITE_OTHER): Payer: Medicare Other | Admitting: Podiatry

## 2020-01-07 DIAGNOSIS — Z9889 Other specified postprocedural states: Secondary | ICD-10-CM | POA: Diagnosis not present

## 2020-01-07 DIAGNOSIS — M19072 Primary osteoarthritis, left ankle and foot: Secondary | ICD-10-CM

## 2020-01-07 MED ORDER — DOXYCYCLINE HYCLATE 100 MG PO TABS: 100.0000 mg | ORAL_TABLET | Freq: Two times a day (BID) | ORAL | 0 refills | Status: DC

## 2020-01-11 NOTE — Progress Notes (Signed)
   Subjective:  Patient presents today status post triple arthrodesis left. DOS: 12/25/2019. She states she is doing well. She reports some pain that occurs when applying pressure to the foot. She has been using the CAM boot and taking Percocet as directed. Patient is here for further evaluation and treatment.   Past Medical History:  Diagnosis Date  . Anemia    hx of in 20's  . Arthritis   . Asthma   . Breast mass, left 2003  . Chronic back pain    seeing Pain specialist  . Complication of anesthesia    "shallow breathing with general"  . DDD (degenerative disc disease)   . Diabetes mellitus    borderline  . DJD (degenerative joint disease)   . GERD (gastroesophageal reflux disease)   . H/O scarlet fever   . Hemorrhoids   . History of chicken pox   . History of measles, mumps, or rubella as child   all 3  . Hx of bladder infections   . Hypothyroidism   . Irritable bowel syndrome   . Lumbar stenosis    RFA done May 2015  . Menopausal symptoms 2003  . Obesity, morbid (HCC) 2006  . Pericarditis    hx of  . Pneumonia    hx of 5 years ago  . Polyp of colon    removed during colonoscopy  . Spinal headache    x1  . Trichomonas   . Urge incontinence 2005  . Vertigo    over a year was last episode  . Yeast in stool       Objective/Physical Exam Neurovascular status intact.  Skin incisions appear to be well coapted with sutures and staples intact. No sign of infectious process noted. No dehiscence. No active bleeding noted. Moderate edema noted to the surgical extremity.  Radiographic Exam:  Orthopedic hardware and osteotomies sites appear to be stable with routine healing.  Assessment: 1. s/p triple arthrodesis left. DOS: 12/25/2019   Plan of Care:  1. Patient was evaluated. X-Rays reviewed.  2. Staples removed.  3. Unna boot applied.  4. Continue nonweightbearing in CAM boot.  5. Refill prescription for Doxycycline 100 mg provided to patient.  6. Continue pain  management.  7. Return to clinic in one week.   Retired Lawyer.   Felecia Shelling, DPM [facility]  Dr. Felecia Shelling, DPM    [address]                                        [place], [place] [address]                Office [phone]  Fax [phone]

## 2020-01-14 ENCOUNTER — Other Ambulatory Visit: Payer: Self-pay

## 2020-01-14 ENCOUNTER — Ambulatory Visit (INDEPENDENT_AMBULATORY_CARE_PROVIDER_SITE_OTHER): Payer: Medicare Other | Admitting: Podiatry

## 2020-01-14 ENCOUNTER — Ambulatory Visit (INDEPENDENT_AMBULATORY_CARE_PROVIDER_SITE_OTHER): Payer: Medicare Other

## 2020-01-14 DIAGNOSIS — M19072 Primary osteoarthritis, left ankle and foot: Secondary | ICD-10-CM

## 2020-01-14 DIAGNOSIS — Z9889 Other specified postprocedural states: Secondary | ICD-10-CM

## 2020-01-15 ENCOUNTER — Telehealth: Payer: Self-pay | Admitting: *Deleted

## 2020-01-15 NOTE — Telephone Encounter (Signed)
Pt states Dr. Logan Bores removed the cast and is allowing her to shower, can she get a pedicure.

## 2020-01-15 NOTE — Telephone Encounter (Signed)
I spoke with pt and informed that we did not recommend pedicure at this time, if she received a nick or break in the skin it could be a portal for infection. Pt asked if she could soak in epsom salt and I told her no that the hot water could cause increased swelling and pain, I also told her that when she showered not to allow the water to beat down on the foot. Pt states understanding.

## 2020-01-21 ENCOUNTER — Encounter: Payer: Medicare Other | Admitting: Podiatry

## 2020-01-26 ENCOUNTER — Telehealth: Payer: Self-pay | Admitting: *Deleted

## 2020-01-26 NOTE — Telephone Encounter (Signed)
Pt called and states her toes and above the ace wrap are swollen.

## 2020-01-26 NOTE — Telephone Encounter (Signed)
I told pt that she should not be wearing the ace to bed, night time allowed the circulation to flow and self regulate to neutral and the ace should be applied before getting out of bed, and if she used a compression sock it should be worn the same way.

## 2020-01-26 NOTE — Telephone Encounter (Signed)
Pt states she has been sleeping in ace wrap and it comes off at night in bed, would a compression sock work better.

## 2020-01-28 NOTE — Progress Notes (Signed)
   Subjective:  Patient presents today status post triple arthrodesis left. DOS: 12/25/2019. She states she is doing well. She states the unna boot has helped alleviate the swelling. She denies any pain or worsening factors at this time. Patient is here for further evaluation and treatment.   Past Medical History:  Diagnosis Date  . Anemia    hx of in 20's  . Arthritis   . Asthma   . Breast mass, left 2003  . Chronic back pain    seeing Pain specialist  . Complication of anesthesia    "shallow breathing with general"  . DDD (degenerative disc disease)   . Diabetes mellitus    borderline  . DJD (degenerative joint disease)   . GERD (gastroesophageal reflux disease)   . H/O scarlet fever   . Hemorrhoids   . History of chicken pox   . History of measles, mumps, or rubella as child   all 3  . Hx of bladder infections   . Hypothyroidism   . Irritable bowel syndrome   . Lumbar stenosis    RFA done May 2015  . Menopausal symptoms 2003  . Obesity, morbid (HCC) 2006  . Pericarditis    hx of  . Pneumonia    hx of 5 years ago  . Polyp of colon    removed during colonoscopy  . Spinal headache    x1  . Trichomonas   . Urge incontinence 2005  . Vertigo    over a year was last episode  . Yeast in stool       Objective/Physical Exam Neurovascular status intact.  Skin incisions appear to be well coapted. No sign of infectious process noted. No dehiscence. No active bleeding noted. Moderate edema noted to the surgical extremity.  Radiographic Exam:  Orthopedic hardware and osteotomies sites appear to be stable with routine healing.  Assessment: 1. s/p triple arthrodesis left. DOS: 12/25/2019   Plan of Care:  1. Patient was evaluated. X-Rays reviewed.  2. One remaining staple removed.  3. Ace wrap applied.  4. Continue nonweightbearing in CAM boot.  5. Return to clinic in 4 weeks.   Retired Lawyer.   Felecia Shelling, DPM [facility]  Dr. Felecia Shelling, DPM      [address]                                        [place], [place] [address]                Office [phone]  Fax [phone]

## 2020-02-11 ENCOUNTER — Ambulatory Visit (INDEPENDENT_AMBULATORY_CARE_PROVIDER_SITE_OTHER): Payer: Medicare Other

## 2020-02-11 ENCOUNTER — Other Ambulatory Visit: Payer: Self-pay | Admitting: Podiatry

## 2020-02-11 ENCOUNTER — Other Ambulatory Visit: Payer: Self-pay

## 2020-02-11 ENCOUNTER — Ambulatory Visit (INDEPENDENT_AMBULATORY_CARE_PROVIDER_SITE_OTHER): Payer: Medicare Other | Admitting: Podiatry

## 2020-02-11 DIAGNOSIS — M19072 Primary osteoarthritis, left ankle and foot: Secondary | ICD-10-CM

## 2020-02-11 DIAGNOSIS — Z9889 Other specified postprocedural states: Secondary | ICD-10-CM

## 2020-02-13 DIAGNOSIS — M25672 Stiffness of left ankle, not elsewhere classified: Secondary | ICD-10-CM | POA: Diagnosis not present

## 2020-02-13 DIAGNOSIS — M25572 Pain in left ankle and joints of left foot: Secondary | ICD-10-CM | POA: Diagnosis not present

## 2020-02-13 DIAGNOSIS — M25671 Stiffness of right ankle, not elsewhere classified: Secondary | ICD-10-CM | POA: Diagnosis not present

## 2020-02-13 DIAGNOSIS — M25571 Pain in right ankle and joints of right foot: Secondary | ICD-10-CM | POA: Diagnosis not present

## 2020-02-13 DIAGNOSIS — R262 Difficulty in walking, not elsewhere classified: Secondary | ICD-10-CM | POA: Diagnosis not present

## 2020-02-15 NOTE — Progress Notes (Signed)
   Subjective:  Patient presents today status post triple arthrodesis left. DOS: 12/25/2019. She reports some sharp pain of the left lateral ankle. Being active on the foot increases the pain. Compression of the ankle helps alleviate the symptoms. She has been using the CAM boot as directed. Patient is here for further evaluation and treatment.   Past Medical History:  Diagnosis Date  . Anemia    hx of in 20's  . Arthritis   . Asthma   . Breast mass, left 2003  . Chronic back pain    seeing Pain specialist  . Complication of anesthesia    "shallow breathing with general"  . DDD (degenerative disc disease)   . Diabetes mellitus    borderline  . DJD (degenerative joint disease)   . GERD (gastroesophageal reflux disease)   . H/O scarlet fever   . Hemorrhoids   . History of chicken pox   . History of measles, mumps, or rubella as child   all 3  . Hx of bladder infections   . Hypothyroidism   . Irritable bowel syndrome   . Lumbar stenosis    RFA done May 2015  . Menopausal symptoms 2003  . Obesity, morbid (HCC) 2006  . Pericarditis    hx of  . Pneumonia    hx of 5 years ago  . Polyp of colon    removed during colonoscopy  . Spinal headache    x1  . Trichomonas   . Urge incontinence 2005  . Vertigo    over a year was last episode  . Yeast in stool       Objective/Physical Exam Neurovascular status intact.  Skin incisions appear to be well coapted. No sign of infectious process noted. No dehiscence. No active bleeding noted. Moderate edema noted to the surgical extremity.  Radiographic Exam:  Orthopedic hardware and osteotomies sites appear to be stable with routine healing.  Assessment: 1. s/p triple arthrodesis left. DOS: 12/25/2019   Plan of Care:  1. Patient was evaluated. X-Rays reviewed.  2. Begin weightbearing with assistance of walker.  3. Discontinue using CAM boot.  4. Post op shoe dispensed.  5. Continue using compression anklet.  6. Physical therapy  ordered at [facility].  7. Return to clinic in 6 weeks.   Retired Lawyer.   Felecia Shelling, DPM [facility]  Dr. Felecia Shelling, DPM    [address]                                        [place], [place] [address]                Office [phone]  Fax [phone]

## 2020-02-19 DIAGNOSIS — M25672 Stiffness of left ankle, not elsewhere classified: Secondary | ICD-10-CM | POA: Diagnosis not present

## 2020-02-19 DIAGNOSIS — M25671 Stiffness of right ankle, not elsewhere classified: Secondary | ICD-10-CM | POA: Diagnosis not present

## 2020-02-19 DIAGNOSIS — M25572 Pain in left ankle and joints of left foot: Secondary | ICD-10-CM | POA: Diagnosis not present

## 2020-02-19 DIAGNOSIS — M25571 Pain in right ankle and joints of right foot: Secondary | ICD-10-CM | POA: Diagnosis not present

## 2020-02-19 DIAGNOSIS — R262 Difficulty in walking, not elsewhere classified: Secondary | ICD-10-CM | POA: Diagnosis not present

## 2020-02-20 DIAGNOSIS — M25671 Stiffness of right ankle, not elsewhere classified: Secondary | ICD-10-CM | POA: Diagnosis not present

## 2020-02-20 DIAGNOSIS — M25672 Stiffness of left ankle, not elsewhere classified: Secondary | ICD-10-CM | POA: Diagnosis not present

## 2020-02-20 DIAGNOSIS — R262 Difficulty in walking, not elsewhere classified: Secondary | ICD-10-CM | POA: Diagnosis not present

## 2020-02-20 DIAGNOSIS — M25572 Pain in left ankle and joints of left foot: Secondary | ICD-10-CM | POA: Diagnosis not present

## 2020-02-20 DIAGNOSIS — M25571 Pain in right ankle and joints of right foot: Secondary | ICD-10-CM | POA: Diagnosis not present

## 2020-02-23 DIAGNOSIS — M25672 Stiffness of left ankle, not elsewhere classified: Secondary | ICD-10-CM | POA: Diagnosis not present

## 2020-02-23 DIAGNOSIS — M25671 Stiffness of right ankle, not elsewhere classified: Secondary | ICD-10-CM | POA: Diagnosis not present

## 2020-02-23 DIAGNOSIS — M25572 Pain in left ankle and joints of left foot: Secondary | ICD-10-CM | POA: Diagnosis not present

## 2020-02-23 DIAGNOSIS — R262 Difficulty in walking, not elsewhere classified: Secondary | ICD-10-CM | POA: Diagnosis not present

## 2020-02-23 DIAGNOSIS — M25571 Pain in right ankle and joints of right foot: Secondary | ICD-10-CM | POA: Diagnosis not present

## 2020-02-25 DIAGNOSIS — R262 Difficulty in walking, not elsewhere classified: Secondary | ICD-10-CM | POA: Diagnosis not present

## 2020-02-25 DIAGNOSIS — M25571 Pain in right ankle and joints of right foot: Secondary | ICD-10-CM | POA: Diagnosis not present

## 2020-02-25 DIAGNOSIS — M25572 Pain in left ankle and joints of left foot: Secondary | ICD-10-CM | POA: Diagnosis not present

## 2020-02-25 DIAGNOSIS — M25672 Stiffness of left ankle, not elsewhere classified: Secondary | ICD-10-CM | POA: Diagnosis not present

## 2020-02-25 DIAGNOSIS — M25671 Stiffness of right ankle, not elsewhere classified: Secondary | ICD-10-CM | POA: Diagnosis not present

## 2020-02-27 DIAGNOSIS — M25671 Stiffness of right ankle, not elsewhere classified: Secondary | ICD-10-CM | POA: Diagnosis not present

## 2020-02-27 DIAGNOSIS — M25571 Pain in right ankle and joints of right foot: Secondary | ICD-10-CM | POA: Diagnosis not present

## 2020-02-27 DIAGNOSIS — M25672 Stiffness of left ankle, not elsewhere classified: Secondary | ICD-10-CM | POA: Diagnosis not present

## 2020-02-27 DIAGNOSIS — R262 Difficulty in walking, not elsewhere classified: Secondary | ICD-10-CM | POA: Diagnosis not present

## 2020-02-27 DIAGNOSIS — M25572 Pain in left ankle and joints of left foot: Secondary | ICD-10-CM | POA: Diagnosis not present

## 2020-03-01 DIAGNOSIS — M25572 Pain in left ankle and joints of left foot: Secondary | ICD-10-CM | POA: Diagnosis not present

## 2020-03-01 DIAGNOSIS — M25671 Stiffness of right ankle, not elsewhere classified: Secondary | ICD-10-CM | POA: Diagnosis not present

## 2020-03-01 DIAGNOSIS — M25672 Stiffness of left ankle, not elsewhere classified: Secondary | ICD-10-CM | POA: Diagnosis not present

## 2020-03-01 DIAGNOSIS — M25571 Pain in right ankle and joints of right foot: Secondary | ICD-10-CM | POA: Diagnosis not present

## 2020-03-01 DIAGNOSIS — R262 Difficulty in walking, not elsewhere classified: Secondary | ICD-10-CM | POA: Diagnosis not present

## 2020-03-05 DIAGNOSIS — M25572 Pain in left ankle and joints of left foot: Secondary | ICD-10-CM | POA: Diagnosis not present

## 2020-03-05 DIAGNOSIS — M25672 Stiffness of left ankle, not elsewhere classified: Secondary | ICD-10-CM | POA: Diagnosis not present

## 2020-03-05 DIAGNOSIS — M25571 Pain in right ankle and joints of right foot: Secondary | ICD-10-CM | POA: Diagnosis not present

## 2020-03-05 DIAGNOSIS — R262 Difficulty in walking, not elsewhere classified: Secondary | ICD-10-CM | POA: Diagnosis not present

## 2020-03-05 DIAGNOSIS — M25671 Stiffness of right ankle, not elsewhere classified: Secondary | ICD-10-CM | POA: Diagnosis not present

## 2020-03-08 DIAGNOSIS — M25571 Pain in right ankle and joints of right foot: Secondary | ICD-10-CM | POA: Diagnosis not present

## 2020-03-08 DIAGNOSIS — M25572 Pain in left ankle and joints of left foot: Secondary | ICD-10-CM | POA: Diagnosis not present

## 2020-03-08 DIAGNOSIS — M25671 Stiffness of right ankle, not elsewhere classified: Secondary | ICD-10-CM | POA: Diagnosis not present

## 2020-03-08 DIAGNOSIS — M25672 Stiffness of left ankle, not elsewhere classified: Secondary | ICD-10-CM | POA: Diagnosis not present

## 2020-03-08 DIAGNOSIS — R262 Difficulty in walking, not elsewhere classified: Secondary | ICD-10-CM | POA: Diagnosis not present

## 2020-03-23 DIAGNOSIS — E039 Hypothyroidism, unspecified: Secondary | ICD-10-CM | POA: Diagnosis not present

## 2020-03-23 DIAGNOSIS — E1122 Type 2 diabetes mellitus with diabetic chronic kidney disease: Secondary | ICD-10-CM | POA: Diagnosis not present

## 2020-03-23 DIAGNOSIS — E785 Hyperlipidemia, unspecified: Secondary | ICD-10-CM | POA: Diagnosis not present

## 2020-03-24 ENCOUNTER — Ambulatory Visit (INDEPENDENT_AMBULATORY_CARE_PROVIDER_SITE_OTHER): Payer: Medicare Other | Admitting: Podiatry

## 2020-03-24 ENCOUNTER — Ambulatory Visit (INDEPENDENT_AMBULATORY_CARE_PROVIDER_SITE_OTHER): Payer: Medicare Other

## 2020-03-24 ENCOUNTER — Other Ambulatory Visit: Payer: Self-pay

## 2020-03-24 DIAGNOSIS — M19072 Primary osteoarthritis, left ankle and foot: Secondary | ICD-10-CM

## 2020-03-24 DIAGNOSIS — Z9889 Other specified postprocedural states: Secondary | ICD-10-CM

## 2020-03-30 DIAGNOSIS — E1140 Type 2 diabetes mellitus with diabetic neuropathy, unspecified: Secondary | ICD-10-CM | POA: Diagnosis not present

## 2020-03-30 DIAGNOSIS — E1122 Type 2 diabetes mellitus with diabetic chronic kidney disease: Secondary | ICD-10-CM | POA: Diagnosis not present

## 2020-03-30 DIAGNOSIS — E785 Hyperlipidemia, unspecified: Secondary | ICD-10-CM | POA: Diagnosis not present

## 2020-03-30 DIAGNOSIS — N182 Chronic kidney disease, stage 2 (mild): Secondary | ICD-10-CM | POA: Diagnosis not present

## 2020-03-30 DIAGNOSIS — E039 Hypothyroidism, unspecified: Secondary | ICD-10-CM | POA: Diagnosis not present

## 2020-03-30 NOTE — Progress Notes (Signed)
   Subjective:  Patient presents today status post triple arthrodesis left. DOS: 12/25/2019. She reports intermittent pain stating she has good and bad days. She reports associated swelling. She has been icing, elevating and taking OTC pain medication for treatment. There are no specific worsening factors noted. Patient is here for further evaluation and treatment.   Past Medical History:  Diagnosis Date  . Anemia    hx of in 20's  . Arthritis   . Asthma   . Breast mass, left 2003  . Chronic back pain    seeing Pain specialist  . Complication of anesthesia    "shallow breathing with general"  . DDD (degenerative disc disease)   . Diabetes mellitus    borderline  . DJD (degenerative joint disease)   . GERD (gastroesophageal reflux disease)   . H/O scarlet fever   . Hemorrhoids   . History of chicken pox   . History of measles, mumps, or rubella as child   all 3  . Hx of bladder infections   . Hypothyroidism   . Irritable bowel syndrome   . Lumbar stenosis    RFA done May 2015  . Menopausal symptoms 2003  . Obesity, morbid (HCC) 2006  . Pericarditis    hx of  . Pneumonia    hx of 5 years ago  . Polyp of colon    removed during colonoscopy  . Spinal headache    x1  . Trichomonas   . Urge incontinence 2005  . Vertigo    over a year was last episode  . Yeast in stool       Objective/Physical Exam Neurovascular status intact.  Skin incisions appear to be well coapted. No sign of infectious process noted. No dehiscence. No active bleeding noted. Moderate edema noted to the surgical extremity.  Radiographic Exam:  Subtalar screw appears to have backed out approximately 1-2 mm based on prior exam.   Assessment: 1. s/p triple arthrodesis left. DOS: 12/25/2019   Plan of Care:  1. Patient was evaluated. X-Rays reviewed.  2. Discontinue using post op shoe. Resume wearing New [employer] shoes.  3. We will observe the subtalar screw at this time.  4. Continue using  compression anklet.  5. Continue physical therapy at [facility].  6. Return to clinic in 6 weeks for follow up X-Ray.    Retired Lawyer.   Felecia Shelling, DPM [facility]  Dr. Felecia Shelling, DPM    [address]                                        [place], [place] [address]                Office [phone]  Fax [phone]

## 2020-03-31 DIAGNOSIS — M25672 Stiffness of left ankle, not elsewhere classified: Secondary | ICD-10-CM | POA: Diagnosis not present

## 2020-03-31 DIAGNOSIS — M25572 Pain in left ankle and joints of left foot: Secondary | ICD-10-CM | POA: Diagnosis not present

## 2020-03-31 DIAGNOSIS — M25671 Stiffness of right ankle, not elsewhere classified: Secondary | ICD-10-CM | POA: Diagnosis not present

## 2020-03-31 DIAGNOSIS — M25571 Pain in right ankle and joints of right foot: Secondary | ICD-10-CM | POA: Diagnosis not present

## 2020-03-31 DIAGNOSIS — R262 Difficulty in walking, not elsewhere classified: Secondary | ICD-10-CM | POA: Diagnosis not present

## 2020-04-02 DIAGNOSIS — M25672 Stiffness of left ankle, not elsewhere classified: Secondary | ICD-10-CM | POA: Diagnosis not present

## 2020-04-02 DIAGNOSIS — M25571 Pain in right ankle and joints of right foot: Secondary | ICD-10-CM | POA: Diagnosis not present

## 2020-04-02 DIAGNOSIS — M25572 Pain in left ankle and joints of left foot: Secondary | ICD-10-CM | POA: Diagnosis not present

## 2020-04-02 DIAGNOSIS — M25671 Stiffness of right ankle, not elsewhere classified: Secondary | ICD-10-CM | POA: Diagnosis not present

## 2020-04-02 DIAGNOSIS — R262 Difficulty in walking, not elsewhere classified: Secondary | ICD-10-CM | POA: Diagnosis not present

## 2020-04-07 DIAGNOSIS — M25572 Pain in left ankle and joints of left foot: Secondary | ICD-10-CM | POA: Diagnosis not present

## 2020-04-07 DIAGNOSIS — M25571 Pain in right ankle and joints of right foot: Secondary | ICD-10-CM | POA: Diagnosis not present

## 2020-04-07 DIAGNOSIS — R262 Difficulty in walking, not elsewhere classified: Secondary | ICD-10-CM | POA: Diagnosis not present

## 2020-04-07 DIAGNOSIS — M25671 Stiffness of right ankle, not elsewhere classified: Secondary | ICD-10-CM | POA: Diagnosis not present

## 2020-04-07 DIAGNOSIS — M25672 Stiffness of left ankle, not elsewhere classified: Secondary | ICD-10-CM | POA: Diagnosis not present

## 2020-04-09 DIAGNOSIS — R262 Difficulty in walking, not elsewhere classified: Secondary | ICD-10-CM | POA: Diagnosis not present

## 2020-04-09 DIAGNOSIS — M25571 Pain in right ankle and joints of right foot: Secondary | ICD-10-CM | POA: Diagnosis not present

## 2020-04-09 DIAGNOSIS — M25572 Pain in left ankle and joints of left foot: Secondary | ICD-10-CM | POA: Diagnosis not present

## 2020-04-09 DIAGNOSIS — M25671 Stiffness of right ankle, not elsewhere classified: Secondary | ICD-10-CM | POA: Diagnosis not present

## 2020-04-09 DIAGNOSIS — M25672 Stiffness of left ankle, not elsewhere classified: Secondary | ICD-10-CM | POA: Diagnosis not present

## 2020-04-14 DIAGNOSIS — M25672 Stiffness of left ankle, not elsewhere classified: Secondary | ICD-10-CM | POA: Diagnosis not present

## 2020-04-14 DIAGNOSIS — M25572 Pain in left ankle and joints of left foot: Secondary | ICD-10-CM | POA: Diagnosis not present

## 2020-04-14 DIAGNOSIS — M25571 Pain in right ankle and joints of right foot: Secondary | ICD-10-CM | POA: Diagnosis not present

## 2020-04-14 DIAGNOSIS — M25671 Stiffness of right ankle, not elsewhere classified: Secondary | ICD-10-CM | POA: Diagnosis not present

## 2020-04-14 DIAGNOSIS — R262 Difficulty in walking, not elsewhere classified: Secondary | ICD-10-CM | POA: Diagnosis not present

## 2020-04-16 DIAGNOSIS — M25572 Pain in left ankle and joints of left foot: Secondary | ICD-10-CM | POA: Diagnosis not present

## 2020-04-16 DIAGNOSIS — M25671 Stiffness of right ankle, not elsewhere classified: Secondary | ICD-10-CM | POA: Diagnosis not present

## 2020-04-16 DIAGNOSIS — R262 Difficulty in walking, not elsewhere classified: Secondary | ICD-10-CM | POA: Diagnosis not present

## 2020-04-16 DIAGNOSIS — M25672 Stiffness of left ankle, not elsewhere classified: Secondary | ICD-10-CM | POA: Diagnosis not present

## 2020-04-16 DIAGNOSIS — M25571 Pain in right ankle and joints of right foot: Secondary | ICD-10-CM | POA: Diagnosis not present

## 2020-04-21 DIAGNOSIS — R262 Difficulty in walking, not elsewhere classified: Secondary | ICD-10-CM | POA: Diagnosis not present

## 2020-04-21 DIAGNOSIS — M25571 Pain in right ankle and joints of right foot: Secondary | ICD-10-CM | POA: Diagnosis not present

## 2020-04-21 DIAGNOSIS — M25672 Stiffness of left ankle, not elsewhere classified: Secondary | ICD-10-CM | POA: Diagnosis not present

## 2020-04-21 DIAGNOSIS — M25572 Pain in left ankle and joints of left foot: Secondary | ICD-10-CM | POA: Diagnosis not present

## 2020-04-21 DIAGNOSIS — M25671 Stiffness of right ankle, not elsewhere classified: Secondary | ICD-10-CM | POA: Diagnosis not present

## 2020-04-27 ENCOUNTER — Telehealth: Payer: Self-pay | Admitting: *Deleted

## 2020-04-27 NOTE — Telephone Encounter (Signed)
I spoke with pt and she states she is not able to wait until 05/10/2020. I told pt I would inform scheduler and have call tomorrow.

## 2020-04-27 NOTE — Telephone Encounter (Signed)
Pt states she is having sharp pain in the plantar foot and needs an earlier appt.

## 2020-04-28 ENCOUNTER — Ambulatory Visit (INDEPENDENT_AMBULATORY_CARE_PROVIDER_SITE_OTHER): Payer: Medicare Other | Admitting: Podiatry

## 2020-04-28 ENCOUNTER — Encounter: Payer: Self-pay | Admitting: Podiatry

## 2020-04-28 ENCOUNTER — Ambulatory Visit (INDEPENDENT_AMBULATORY_CARE_PROVIDER_SITE_OTHER): Payer: Medicare Other

## 2020-04-28 ENCOUNTER — Other Ambulatory Visit: Payer: Self-pay

## 2020-04-28 DIAGNOSIS — M7672 Peroneal tendinitis, left leg: Secondary | ICD-10-CM | POA: Diagnosis not present

## 2020-04-28 DIAGNOSIS — Z9889 Other specified postprocedural states: Secondary | ICD-10-CM

## 2020-04-28 DIAGNOSIS — R262 Difficulty in walking, not elsewhere classified: Secondary | ICD-10-CM | POA: Diagnosis not present

## 2020-04-28 DIAGNOSIS — M25572 Pain in left ankle and joints of left foot: Secondary | ICD-10-CM | POA: Diagnosis not present

## 2020-04-28 DIAGNOSIS — M25571 Pain in right ankle and joints of right foot: Secondary | ICD-10-CM | POA: Diagnosis not present

## 2020-04-28 DIAGNOSIS — M19072 Primary osteoarthritis, left ankle and foot: Secondary | ICD-10-CM | POA: Diagnosis not present

## 2020-04-28 DIAGNOSIS — M25672 Stiffness of left ankle, not elsewhere classified: Secondary | ICD-10-CM | POA: Diagnosis not present

## 2020-04-28 DIAGNOSIS — M25671 Stiffness of right ankle, not elsewhere classified: Secondary | ICD-10-CM | POA: Diagnosis not present

## 2020-04-28 NOTE — Progress Notes (Signed)
Subjective:  Patient presents today status post triple arthrodesis left. DOS: 12/25/2019.  Patient states that she was doing very good until Friday, 04/23/2020, when she began to notice pain to the lateral aspect of the surgical foot.  The patient has been walking and new balance shoes with OTC insoles and going to physical therapy and doing swimming exercises at the pool.  She had no pain and doing very well until Friday when the pain began.  Pain is currently severe to the lateral aspect of the left foot.  She denies injury or tripping and falling.  She presents for further treatment and evaluation  Past Medical History:  Diagnosis Date  . Anemia    hx of in 20's  . Arthritis   . Asthma   . Breast mass, left 2003  . Chronic back pain    seeing Pain specialist  . Complication of anesthesia    "shallow breathing with general"  . DDD (degenerative disc disease)   . Diabetes mellitus    borderline  . DJD (degenerative joint disease)   . GERD (gastroesophageal reflux disease)   . H/O scarlet fever   . Hemorrhoids   . History of chicken pox   . History of measles, mumps, or rubella as child   all 3  . Hx of bladder infections   . Hypothyroidism   . Irritable bowel syndrome   . Lumbar stenosis    RFA done May 2015  . Menopausal symptoms 2003  . Obesity, morbid (HCC) 2006  . Pericarditis    hx of  . Pneumonia    hx of 5 years ago  . Polyp of colon    removed during colonoscopy  . Spinal headache    x1  . Trichomonas   . Urge incontinence 2005  . Vertigo    over a year was last episode  . Yeast in stool    Objective: Physical Exam General: The patient is alert and oriented x3 in no acute distress.  Dermatology: Skin incisions are completely healed.  Skin is cool, dry and supple bilateral lower extremities. Negative for open lesions or macerations.  Vascular: Palpable pedal pulses bilaterally.  There is some moderate edema throughout the foot and ankle and  leg.  Neurological: Epicritic and protective threshold grossly intact bilaterally.   Musculoskeletal Exam: Status post triple arthrodesis with routine healing left.  Ankle joint range of motion within normal limits.  There is some pain on palpation along the peroneal tendons as it passes the cuboid consistent with a peroneal tendinitis   Radiographic Exam:  Orthopedic hardware has not moved since last radiographic exam.  Routine healing of the subtalar, talonavicular, and calcaneocuboid joint.  There does appear to be some delayed healing of the CC joint however everything appears stable and intact.  No new fractures identified.  Assessment: 1. s/p triple arthrodesis left. DOS: 12/25/2019 2.  Acute peroneal tendinitis left  Plan of Care:  1. Patient was evaluated. X-Rays reviewed.  2.  Injection of 0.5 cc Celestone Soluspan injected into the peroneal tendon sheath just proximal to the fifth metatarsal tubercle left foot 3.  Resume weightbearing in the cam boot x2 weeks 4.  Compression ankle sleeve dispensed today.  Continue compression anklet 5.  Continue Celebrex as per PCP for lumbar stenosis  6.  Return to clinic in 3 weeks  Retired Lawyer.   Felecia Shelling, DPM [facility]  Dr. Felecia Shelling, DPM    [address]  [place], [place] [address]                Office [phone]  Fax [phone]

## 2020-05-10 ENCOUNTER — Ambulatory Visit: Payer: Medicare Other | Admitting: Podiatry

## 2020-05-20 ENCOUNTER — Ambulatory Visit: Payer: Medicare Other | Admitting: Podiatry

## 2020-06-07 ENCOUNTER — Telehealth: Payer: Self-pay | Admitting: Podiatry

## 2020-06-07 ENCOUNTER — Ambulatory Visit (INDEPENDENT_AMBULATORY_CARE_PROVIDER_SITE_OTHER): Payer: Medicare Other | Admitting: Podiatry

## 2020-06-07 ENCOUNTER — Other Ambulatory Visit: Payer: Self-pay

## 2020-06-07 DIAGNOSIS — M7672 Peroneal tendinitis, left leg: Secondary | ICD-10-CM | POA: Diagnosis not present

## 2020-06-07 DIAGNOSIS — Z9889 Other specified postprocedural states: Secondary | ICD-10-CM | POA: Diagnosis not present

## 2020-06-07 NOTE — Telephone Encounter (Signed)
Pt called stating she was to have a predisone dose pak sent to pharmacy she waiting on script to be called in

## 2020-06-08 ENCOUNTER — Telehealth: Payer: Self-pay | Admitting: Podiatry

## 2020-06-08 MED ORDER — METHYLPREDNISOLONE 4 MG PO TBPK: ORAL_TABLET | ORAL | 0 refills | Status: DC

## 2020-06-08 NOTE — Telephone Encounter (Signed)
Pt called again to request dose pak to be sent to pharmacy

## 2020-06-08 NOTE — Telephone Encounter (Signed)
Pt following up on dose pak that was to be sent to pts pharmacy [facility] on [address]

## 2020-06-08 NOTE — Progress Notes (Signed)
   Subjective:  Patient presents today status post triple arthrodesis left. DOS: 12/25/2019.  Patient states that the injection did help with pain that she received to the lateral aspect of the foot and ankle.  Currently she is only having slight pain.  She is continuing to wear the compression ankle sleeve as directed.  No new complaints at this time  Past Medical History:  Diagnosis Date  . Anemia    hx of in 20's  . Arthritis   . Asthma   . Breast mass, left 2003  . Chronic back pain    seeing Pain specialist  . Complication of anesthesia    "shallow breathing with general"  . DDD (degenerative disc disease)   . Diabetes mellitus    borderline  . DJD (degenerative joint disease)   . GERD (gastroesophageal reflux disease)   . H/O scarlet fever   . Hemorrhoids   . History of chicken pox   . History of measles, mumps, or rubella as child   all 3  . Hx of bladder infections   . Hypothyroidism   . Irritable bowel syndrome   . Lumbar stenosis    RFA done May 2015  . Menopausal symptoms 2003  . Obesity, morbid (HCC) 2006  . Pericarditis    hx of  . Pneumonia    hx of 5 years ago  . Polyp of colon    removed during colonoscopy  . Spinal headache    x1  . Trichomonas   . Urge incontinence 2005  . Vertigo    over a year was last episode  . Yeast in stool    Objective: Physical Exam General: The patient is alert and oriented x3 in no acute distress.  Dermatology: Skin incisions are completely healed.  Skin is cool, dry and supple bilateral lower extremities. Negative for open lesions or macerations.  Vascular: Palpable pedal pulses bilaterally.  There is some moderate edema throughout the foot and ankle and leg.  Neurological: Epicritic and protective threshold grossly intact bilaterally.   Musculoskeletal Exam: Status post triple arthrodesis with routine healing left.  Ankle joint range of motion within normal limits.  There is some pain on palpation along the peroneal  tendons as it passes the cuboid consistent with a peroneal tendinitis  Assessment: 1. s/p triple arthrodesis left. DOS: 12/25/2019 2.  Acute peroneal tendinitis left  Plan of Care:  1. Patient was evaluated. 2.  Prescription for Medrol Dosepak, then resume the Celebrex as per PCP for lumbar stenosis  3.  Discontinue the cam boot.  Recommend good supportive sneakers.  It is now been 6 months since the patient has had triple arthrodesis surgery  4.  Compression anklet dispensed.  Continue as needed  5.  Return to clinic in 6 weeks for final follow-up x-ray   retired Lawyer.   Felecia Shelling, DPM [facility]  Dr. Felecia Shelling, DPM    [address]                                        [place], [place] [address]                Office [phone]  Fax [phone]

## 2020-07-19 ENCOUNTER — Encounter: Payer: Self-pay | Admitting: Podiatrist

## 2020-07-19 ENCOUNTER — Other Ambulatory Visit: Payer: Self-pay

## 2020-07-19 ENCOUNTER — Ambulatory Visit (INDEPENDENT_AMBULATORY_CARE_PROVIDER_SITE_OTHER): Payer: Medicare Other | Admitting: Podiatrist

## 2020-07-19 ENCOUNTER — Ambulatory Visit (INDEPENDENT_AMBULATORY_CARE_PROVIDER_SITE_OTHER): Payer: Medicare Other

## 2020-07-19 DIAGNOSIS — M7672 Peroneal tendinitis, left leg: Secondary | ICD-10-CM

## 2020-07-19 DIAGNOSIS — E785 Hyperlipidemia, unspecified: Secondary | ICD-10-CM | POA: Diagnosis not present

## 2020-07-19 DIAGNOSIS — E039 Hypothyroidism, unspecified: Secondary | ICD-10-CM | POA: Diagnosis not present

## 2020-07-19 DIAGNOSIS — Z9889 Other specified postprocedural states: Secondary | ICD-10-CM

## 2020-07-19 DIAGNOSIS — E1122 Type 2 diabetes mellitus with diabetic chronic kidney disease: Secondary | ICD-10-CM | POA: Diagnosis not present

## 2020-07-19 MED ORDER — METHYLPREDNISOLONE 4 MG PO TBPK: ORAL_TABLET | ORAL | 0 refills | Status: DC

## 2020-07-19 NOTE — Patient Instructions (Signed)
Ankle brace instructions:  Wear the brace every day (remove at night to sleep)- for 2 weeks.  On week 3 wear the brace every other day and the compression anklet on the off days.  On week 4 only wear the compression anklet   Try to notice if wearing the ankle brace helps with balance.  You will see Dr. Logan Bores in 4 weeks for your surgery follow up-- the steroid pack has been called into [facility] on [address].

## 2020-07-19 NOTE — Progress Notes (Signed)
   Patient presents today 6 months status post triple arthrodesis of the left foot. shr states she is still having pain when walking.  She also relates some swelling on the outside of the left foot.   She states she took a medrol dose pack which helped her and she is requesting another.  She also has been limiting herself from walking and relates her balance still feels off despite PT.    Subjective: Patient presents today6 months status post foot surgery of the left foot.   Patient denies nausea, vomiting, fevers, chills or night sweats.  Denies calf pain or tenderness to the operative side.  Objective:  Neurovascular status is intact with palpable pedal pulses DP and PT at 2+ out of 4 left foot.  Negative homans sign noted.  Neurological sensation is intact and unchanged as per prior to surgery. Excellent appearance of the postoperative foot is noted.  Minimal swelling noted.  Some pain along the peroneal tendon left just lateral to lateral malleoulus noted.     Xrays show a healed surgical foot.  Good alignment and consolidation of the Subtalar joint, talonavicular joint and calcaneal cuboid joint noted.   Assessment: Status post left foot triple arthrodesis dos 12/25/19  Plan:  Dispensed a trilock ankle brace for her to try on the left foot to support the foot and peroneals.  Recommend she wear during the day for 2 weeks then wean out into her compression anklet.  Also refilled the medrol dosepak medication.  She will see Dr. Logan Bores for her final follow up in 4 weeks.

## 2020-07-27 DIAGNOSIS — N182 Chronic kidney disease, stage 2 (mild): Secondary | ICD-10-CM | POA: Diagnosis not present

## 2020-07-27 DIAGNOSIS — E785 Hyperlipidemia, unspecified: Secondary | ICD-10-CM | POA: Diagnosis not present

## 2020-07-27 DIAGNOSIS — E1122 Type 2 diabetes mellitus with diabetic chronic kidney disease: Secondary | ICD-10-CM | POA: Diagnosis not present

## 2020-07-27 DIAGNOSIS — E1140 Type 2 diabetes mellitus with diabetic neuropathy, unspecified: Secondary | ICD-10-CM | POA: Diagnosis not present

## 2020-07-27 DIAGNOSIS — M792 Neuralgia and neuritis, unspecified: Secondary | ICD-10-CM | POA: Diagnosis not present

## 2020-07-27 DIAGNOSIS — E039 Hypothyroidism, unspecified: Secondary | ICD-10-CM | POA: Diagnosis not present

## 2020-08-16 ENCOUNTER — Other Ambulatory Visit: Payer: Self-pay

## 2020-08-16 ENCOUNTER — Ambulatory Visit (INDEPENDENT_AMBULATORY_CARE_PROVIDER_SITE_OTHER): Payer: Medicare Other | Admitting: Podiatry

## 2020-08-16 DIAGNOSIS — M19072 Primary osteoarthritis, left ankle and foot: Secondary | ICD-10-CM

## 2020-08-16 DIAGNOSIS — M79672 Pain in left foot: Secondary | ICD-10-CM | POA: Diagnosis not present

## 2020-08-16 DIAGNOSIS — Z9889 Other specified postprocedural states: Secondary | ICD-10-CM | POA: Diagnosis not present

## 2020-08-16 DIAGNOSIS — M7672 Peroneal tendinitis, left leg: Secondary | ICD-10-CM | POA: Diagnosis not present

## 2020-08-16 MED ORDER — MELOXICAM 15 MG PO TABS: 15.0000 mg | ORAL_TABLET | Freq: Every day | ORAL | 1 refills | Status: DC

## 2020-08-16 NOTE — Progress Notes (Signed)
Subjective:  Patient presents today status post triple arthrodesis left. DOS: 12/25/2019.  Patient states that she is doing well with some occasional lateral column foot pain as well as plantar heel pain intermittently.  Otherwise she is walking well.  She does experience pain when walking barefoot around the house.  No new complaints at this time  Past Medical History:  Diagnosis Date  . Anemia    hx of in 20's  . Arthritis   . Asthma   . Breast mass, left 2003  . Chronic back pain    seeing Pain specialist  . Complication of anesthesia    "shallow breathing with general"  . DDD (degenerative disc disease)   . Diabetes mellitus    borderline  . DJD (degenerative joint disease)   . GERD (gastroesophageal reflux disease)   . H/O scarlet fever   . Hemorrhoids   . History of chicken pox   . History of measles, mumps, or rubella as child   all 3  . Hx of bladder infections   . Hypothyroidism   . Irritable bowel syndrome   . Lumbar stenosis    RFA done May 2015  . Menopausal symptoms 2003  . Obesity, morbid (HCC) 2006  . Pericarditis    hx of  . Pneumonia    hx of 5 years ago  . Polyp of colon    removed during colonoscopy  . Spinal headache    x1  . Trichomonas   . Urge incontinence 2005  . Vertigo    over a year was last episode  . Yeast in stool    Objective: Physical Exam General: The patient is alert and oriented x3 in no acute distress.  Dermatology: Skin incisions are completely healed.  Skin is cool, dry and supple bilateral lower extremities. Negative for open lesions or macerations.  Vascular: Palpable pedal pulses bilaterally.  There is some moderate edema throughout the foot and ankle and leg.  Neurological: Epicritic and protective threshold grossly intact bilaterally.   Musculoskeletal Exam: Status post triple arthrodesis with routine healing left.  Ankle joint range of motion within normal limits.  There is some pain on palpation along the peroneal  tendons as it passes the cuboid consistent with a peroneal tendinitis versus possible CC joint arthrodesis pain  Assessment: 1. s/p triple arthrodesis left. DOS: 12/25/2019 2.  Acute peroneal tendinitis left  Plan of Care:  1. Patient was evaluated.  Last x-rays were reviewed.  Orthopedic hardware appears to be intact with good healing of the arthrodesis site.  The joints continue to be somewhat vaguely visible indicated of continued healing. 2.  Patient would like to start working out with a trainer.  This is okay she is now 7 months postoperative and she can begin increasing activity. 3.  Prescription for meloxicam 15 mg daily 4.  Compression anklet dispensed.  Wear daily 5.  The patient is wearing some OTC insoles with the lateral column buildup.  Today I remove the lateral column with felt padding.  I do believe this may be contributing to the patient's lateral column pain  6.  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   Felecia Shelling, DPM [facility]  Dr. Felecia Shelling, DPM    [address]  [place], [place] [address]                Office [phone]  Fax [phone]

## 2020-09-28 DIAGNOSIS — M7918 Myalgia, other site: Secondary | ICD-10-CM | POA: Diagnosis not present

## 2020-09-28 DIAGNOSIS — M5126 Other intervertebral disc displacement, lumbar region: Secondary | ICD-10-CM | POA: Diagnosis not present

## 2020-09-28 DIAGNOSIS — G894 Chronic pain syndrome: Secondary | ICD-10-CM | POA: Diagnosis not present

## 2020-10-12 DIAGNOSIS — M48062 Spinal stenosis, lumbar region with neurogenic claudication: Secondary | ICD-10-CM | POA: Diagnosis not present

## 2020-10-21 ENCOUNTER — Telehealth: Payer: Self-pay

## 2020-10-21 ENCOUNTER — Other Ambulatory Visit: Payer: Self-pay | Admitting: Rehabilitation

## 2020-10-21 DIAGNOSIS — M48062 Spinal stenosis, lumbar region with neurogenic claudication: Secondary | ICD-10-CM

## 2020-10-21 NOTE — Telephone Encounter (Signed)
Phone call to patient to verify medication list and allergies for myelogram procedure. Pt instructed to hold Tramadol for 48hrs prior to myelogram appointment time and 24 hours after appointment. Pt also reports she has a bladder stimulator and will bring her remote to turn it off for the CT scan. Pt also instructed to have a driver the day of the procedure, the procedure would take around 2 hours, and discharge instructions discussed. Pt verbalized understanding.

## 2020-10-28 ENCOUNTER — Other Ambulatory Visit: Payer: Self-pay

## 2020-10-28 ENCOUNTER — Ambulatory Visit
Admission: RE | Admit: 2020-10-28 | Discharge: 2020-10-28 | Disposition: A | Discharge status: Home / Self Care | Payer: Medicare Other | Source: Ambulatory Visit | Attending: Rehabilitation | Admitting: Rehabilitation

## 2020-10-28 DIAGNOSIS — M48062 Spinal stenosis, lumbar region with neurogenic claudication: Secondary | ICD-10-CM

## 2020-10-28 DIAGNOSIS — M48061 Spinal stenosis, lumbar region without neurogenic claudication: Secondary | ICD-10-CM | POA: Diagnosis not present

## 2020-10-28 DIAGNOSIS — M4326 Fusion of spine, lumbar region: Secondary | ICD-10-CM | POA: Diagnosis not present

## 2020-10-28 MED ORDER — IOPAMIDOL (ISOVUE-M 200) INJECTION 41%
20.0000 mL | Freq: Once | INTRAMUSCULAR | Status: AC
  Administered 2020-10-28: 20 mL via INTRATHECAL

## 2020-10-28 MED ORDER — DIAZEPAM 5 MG PO TABS
5.0000 mg | ORAL_TABLET | Freq: Once | ORAL | Status: AC
  Administered 2020-10-28: 5 mg via ORAL

## 2020-10-28 NOTE — Discharge Instructions (Signed)
Myelogram Discharge Instructions  1. Go home and rest quietly for the next 24 hours.  It is important to lie flat for the next 24 hours.  Get up only to go to the restroom.  You may lie in the bed or on a couch on your back, your stomach, your left side or your right side.  You may have one pillow under your head.  You may have pillows between your knees while you are on your side or under your knees while you are on your back.  2. DO NOT drive today.  Recline the seat as far back as it will go, while still wearing your seat belt, on the way home.  3. You may get up to go to the bathroom as needed.  You may sit up for 10 minutes to eat.  You may resume your normal diet and medications unless otherwise indicated.  Drink lots of extra fluids today and tomorrow.  4. The incidence of headache, nausea, or vomiting is about 5% (one in 20 patients).  If you develop a headache, lie flat and drink plenty of fluids until the headache goes away.  Caffeinated beverages may be helpful.  If you develop severe nausea and vomiting or a headache that does not go away with flat bed rest, call [phone].  5. You may resume normal activities after your 24 hours of bed rest is over; however, do not exert yourself strongly or do any heavy lifting tomorrow. If when you get up you have a headache when standing, go back to bed and force fluids for another 24 hours.  6. Call your physician for a follow-up appointment.  The results of your myelogram will be sent directly to your physician by the following day.  7. If you have any questions or if complications develop after you arrive home, please call [phone].  Discharge instructions have been explained to the patient.  The patient, or the person responsible for the patient, fully understands these instructions  YOU MAY RESUME YOUR TRAMADOL TOMORROW 10/29/20 AT 1030 AM

## 2020-10-28 NOTE — Progress Notes (Signed)
PT reports she has stopped her Tramadol for at least 48 hours.

## 2020-10-29 DIAGNOSIS — M48062 Spinal stenosis, lumbar region with neurogenic claudication: Secondary | ICD-10-CM | POA: Diagnosis not present

## 2020-11-10 DIAGNOSIS — M48062 Spinal stenosis, lumbar region with neurogenic claudication: Secondary | ICD-10-CM | POA: Diagnosis not present

## 2020-11-17 ENCOUNTER — Ambulatory Visit (INDEPENDENT_AMBULATORY_CARE_PROVIDER_SITE_OTHER): Payer: Medicare Other | Admitting: Podiatry

## 2020-11-17 ENCOUNTER — Other Ambulatory Visit: Payer: Self-pay

## 2020-11-17 DIAGNOSIS — M7672 Peroneal tendinitis, left leg: Secondary | ICD-10-CM

## 2020-11-17 DIAGNOSIS — Z9889 Other specified postprocedural states: Secondary | ICD-10-CM

## 2020-11-17 DIAGNOSIS — M19072 Primary osteoarthritis, left ankle and foot: Secondary | ICD-10-CM

## 2020-11-21 NOTE — Progress Notes (Signed)
   Subjective:  Patient presents today status post triple arthrodesis left. DOS: 12/25/2019.  Patient states that she is doing well.  Any pain associated to the foot has improved significantly.  Occasionally if she is on her foot for significant amount of time she will have some mild swelling to the area.  She is slowly been increasing her activities.  No new complaints at this time  Past Medical History:  Diagnosis Date  . Anemia    hx of in 20's  . Arthritis   . Asthma   . Breast mass, left 2003  . Chronic back pain    seeing Pain specialist  . Complication of anesthesia    "shallow breathing with general"  . DDD (degenerative disc disease)   . Diabetes mellitus    borderline  . DJD (degenerative joint disease)   . GERD (gastroesophageal reflux disease)   . H/O scarlet fever   . Hemorrhoids   . History of chicken pox   . History of measles, mumps, or rubella as child   all 3  . Hx of bladder infections   . Hypothyroidism   . Irritable bowel syndrome   . Lumbar stenosis    RFA done May 2015  . Menopausal symptoms 2003  . Obesity, morbid (HCC) 2006  . Pericarditis    hx of  . Pneumonia    hx of 5 years ago  . Polyp of colon    removed during colonoscopy  . Spinal headache    x1  . Trichomonas   . Urge incontinence 2005  . Vertigo    over a year was last episode  . Yeast in stool    Objective: Physical Exam General: The patient is alert and oriented x3 in no acute distress.  Dermatology: Skin incisions are completely healed.  Skin is cool, dry and supple bilateral lower extremities. Negative for open lesions or macerations.  Vascular: Palpable pedal pulses bilaterally.  There is some moderate edema throughout the foot and ankle and leg.  Neurological: Epicritic and protective threshold grossly intact bilaterally.   Musculoskeletal Exam: Status post triple arthrodesis with routine healing left.  Ankle joint range of motion within normal limits.  There is some pain  on palpation along the peroneal tendons as it passes the cuboid consistent with a peroneal tendinitis versus possible CC joint arthrodesis pain  Assessment: 1. s/p triple arthrodesis left. DOS: 12/25/2019 2.  Acute peroneal tendinitis left-resolved  Plan of Care:  1. Patient was evaluated.   2.  Patient may now resume full activity no restrictions.  Recommend good supportive shoes 3.  Refill prescription for meloxicam 15 mg as needed 4.  Compression ankle sleeve dispensed.  Continue wearing daily as needed 5.  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 Felecia Shelling, DPM    [address] [place], [place] [address]                Office [phone]  Fax [phone]

## 2020-11-24 DIAGNOSIS — M48062 Spinal stenosis, lumbar region with neurogenic claudication: Secondary | ICD-10-CM | POA: Diagnosis not present

## 2020-11-24 DIAGNOSIS — M5106 Intervertebral disc disorders with myelopathy, lumbar region: Secondary | ICD-10-CM | POA: Diagnosis not present

## 2020-11-24 DIAGNOSIS — M4716 Other spondylosis with myelopathy, lumbar region: Secondary | ICD-10-CM | POA: Diagnosis not present

## 2020-11-24 DIAGNOSIS — Z4689 Encounter for fitting and adjustment of other specified devices: Secondary | ICD-10-CM | POA: Diagnosis not present

## 2020-12-01 DIAGNOSIS — E039 Hypothyroidism, unspecified: Secondary | ICD-10-CM | POA: Diagnosis not present

## 2020-12-01 DIAGNOSIS — Z23 Encounter for immunization: Secondary | ICD-10-CM | POA: Diagnosis not present

## 2020-12-01 DIAGNOSIS — R5383 Other fatigue: Secondary | ICD-10-CM | POA: Diagnosis not present

## 2020-12-01 DIAGNOSIS — F5102 Adjustment insomnia: Secondary | ICD-10-CM | POA: Diagnosis not present

## 2020-12-01 DIAGNOSIS — E1122 Type 2 diabetes mellitus with diabetic chronic kidney disease: Secondary | ICD-10-CM | POA: Diagnosis not present

## 2020-12-01 DIAGNOSIS — Z01810 Encounter for preprocedural cardiovascular examination: Secondary | ICD-10-CM | POA: Diagnosis not present

## 2020-12-02 DIAGNOSIS — E1122 Type 2 diabetes mellitus with diabetic chronic kidney disease: Secondary | ICD-10-CM | POA: Diagnosis not present

## 2020-12-02 DIAGNOSIS — E039 Hypothyroidism, unspecified: Secondary | ICD-10-CM | POA: Diagnosis not present

## 2020-12-31 DIAGNOSIS — M5106 Intervertebral disc disorders with myelopathy, lumbar region: Secondary | ICD-10-CM | POA: Diagnosis not present

## 2020-12-31 DIAGNOSIS — Z79899 Other long term (current) drug therapy: Secondary | ICD-10-CM | POA: Diagnosis not present

## 2020-12-31 DIAGNOSIS — M48062 Spinal stenosis, lumbar region with neurogenic claudication: Secondary | ICD-10-CM | POA: Diagnosis not present

## 2020-12-31 DIAGNOSIS — M4716 Other spondylosis with myelopathy, lumbar region: Secondary | ICD-10-CM | POA: Diagnosis not present

## 2021-01-07 DIAGNOSIS — M48062 Spinal stenosis, lumbar region with neurogenic claudication: Secondary | ICD-10-CM | POA: Diagnosis not present

## 2021-01-07 DIAGNOSIS — E119 Type 2 diabetes mellitus without complications: Secondary | ICD-10-CM | POA: Diagnosis not present

## 2021-01-07 DIAGNOSIS — Z01812 Encounter for preprocedural laboratory examination: Secondary | ICD-10-CM | POA: Diagnosis not present

## 2021-01-07 DIAGNOSIS — E785 Hyperlipidemia, unspecified: Secondary | ICD-10-CM | POA: Diagnosis not present

## 2021-01-07 DIAGNOSIS — R2689 Other abnormalities of gait and mobility: Secondary | ICD-10-CM | POA: Diagnosis not present

## 2021-01-07 DIAGNOSIS — E039 Hypothyroidism, unspecified: Secondary | ICD-10-CM | POA: Diagnosis not present

## 2021-01-17 DIAGNOSIS — M4716 Other spondylosis with myelopathy, lumbar region: Secondary | ICD-10-CM | POA: Diagnosis not present

## 2021-01-17 DIAGNOSIS — E119 Type 2 diabetes mellitus without complications: Secondary | ICD-10-CM | POA: Diagnosis not present

## 2021-01-17 DIAGNOSIS — M5106 Intervertebral disc disorders with myelopathy, lumbar region: Secondary | ICD-10-CM | POA: Diagnosis not present

## 2021-01-17 DIAGNOSIS — G8918 Other acute postprocedural pain: Secondary | ICD-10-CM | POA: Diagnosis not present

## 2021-01-17 DIAGNOSIS — M48062 Spinal stenosis, lumbar region with neurogenic claudication: Secondary | ICD-10-CM | POA: Diagnosis not present

## 2021-01-17 DIAGNOSIS — Z981 Arthrodesis status: Secondary | ICD-10-CM | POA: Diagnosis not present

## 2021-01-17 DIAGNOSIS — M5116 Intervertebral disc disorders with radiculopathy, lumbar region: Secondary | ICD-10-CM | POA: Diagnosis not present

## 2021-01-17 DIAGNOSIS — Z4889 Encounter for other specified surgical aftercare: Secondary | ICD-10-CM | POA: Diagnosis not present

## 2021-01-17 DIAGNOSIS — M4316 Spondylolisthesis, lumbar region: Secondary | ICD-10-CM | POA: Diagnosis not present

## 2021-01-18 DIAGNOSIS — M4805 Spinal stenosis, thoracolumbar region: Secondary | ICD-10-CM | POA: Diagnosis not present

## 2021-01-18 DIAGNOSIS — M4316 Spondylolisthesis, lumbar region: Secondary | ICD-10-CM | POA: Diagnosis not present

## 2021-01-18 DIAGNOSIS — M2578 Osteophyte, vertebrae: Secondary | ICD-10-CM | POA: Diagnosis not present

## 2021-01-18 DIAGNOSIS — M47816 Spondylosis without myelopathy or radiculopathy, lumbar region: Secondary | ICD-10-CM | POA: Diagnosis not present

## 2021-01-21 DIAGNOSIS — Z4789 Encounter for other orthopedic aftercare: Secondary | ICD-10-CM | POA: Diagnosis not present

## 2021-01-21 DIAGNOSIS — K219 Gastro-esophageal reflux disease without esophagitis: Secondary | ICD-10-CM | POA: Diagnosis not present

## 2021-01-21 DIAGNOSIS — Z7984 Long term (current) use of oral hypoglycemic drugs: Secondary | ICD-10-CM | POA: Diagnosis not present

## 2021-01-21 DIAGNOSIS — E1140 Type 2 diabetes mellitus with diabetic neuropathy, unspecified: Secondary | ICD-10-CM | POA: Diagnosis not present

## 2021-01-21 DIAGNOSIS — Z6841 Body Mass Index (BMI) 40.0 and over, adult: Secondary | ICD-10-CM | POA: Diagnosis not present

## 2021-01-21 DIAGNOSIS — M48062 Spinal stenosis, lumbar region with neurogenic claudication: Secondary | ICD-10-CM | POA: Diagnosis not present

## 2021-01-21 DIAGNOSIS — E039 Hypothyroidism, unspecified: Secondary | ICD-10-CM | POA: Diagnosis not present

## 2021-01-21 DIAGNOSIS — F32A Depression, unspecified: Secondary | ICD-10-CM | POA: Diagnosis not present

## 2021-01-21 DIAGNOSIS — J45909 Unspecified asthma, uncomplicated: Secondary | ICD-10-CM | POA: Diagnosis not present

## 2021-01-21 DIAGNOSIS — Z981 Arthrodesis status: Secondary | ICD-10-CM | POA: Diagnosis not present

## 2021-01-21 DIAGNOSIS — E785 Hyperlipidemia, unspecified: Secondary | ICD-10-CM | POA: Diagnosis not present

## 2021-01-21 DIAGNOSIS — Z96653 Presence of artificial knee joint, bilateral: Secondary | ICD-10-CM | POA: Diagnosis not present

## 2021-01-21 DIAGNOSIS — Z96641 Presence of right artificial hip joint: Secondary | ICD-10-CM | POA: Diagnosis not present

## 2021-01-21 DIAGNOSIS — M4726 Other spondylosis with radiculopathy, lumbar region: Secondary | ICD-10-CM | POA: Diagnosis not present

## 2021-01-26 ENCOUNTER — Telehealth: Payer: Self-pay | Admitting: *Deleted

## 2021-01-26 NOTE — Telephone Encounter (Signed)
Patient has been having ankle (DOS:12/25/19-triple arthrodesis, left) numbness since her back surgery on 01/17/21 and now she thinks it is affecting  the knee down to ankle. She cannot schedule an appointment until March 29 th because of her back and not being able to walk. Please advise.

## 2021-02-02 DIAGNOSIS — Z4789 Encounter for other orthopedic aftercare: Secondary | ICD-10-CM | POA: Diagnosis not present

## 2021-02-02 DIAGNOSIS — M48062 Spinal stenosis, lumbar region with neurogenic claudication: Secondary | ICD-10-CM | POA: Diagnosis not present

## 2021-02-02 DIAGNOSIS — M4726 Other spondylosis with radiculopathy, lumbar region: Secondary | ICD-10-CM | POA: Diagnosis not present

## 2021-02-02 DIAGNOSIS — E1140 Type 2 diabetes mellitus with diabetic neuropathy, unspecified: Secondary | ICD-10-CM | POA: Diagnosis not present

## 2021-02-15 DIAGNOSIS — M48062 Spinal stenosis, lumbar region with neurogenic claudication: Secondary | ICD-10-CM | POA: Diagnosis not present

## 2021-02-21 ENCOUNTER — Other Ambulatory Visit: Payer: Self-pay | Admitting: Podiatry

## 2021-02-21 DIAGNOSIS — M6281 Muscle weakness (generalized): Secondary | ICD-10-CM | POA: Diagnosis not present

## 2021-02-21 DIAGNOSIS — M5459 Other low back pain: Secondary | ICD-10-CM | POA: Diagnosis not present

## 2021-02-21 DIAGNOSIS — R2689 Other abnormalities of gait and mobility: Secondary | ICD-10-CM | POA: Diagnosis not present

## 2021-02-24 DIAGNOSIS — M5459 Other low back pain: Secondary | ICD-10-CM | POA: Diagnosis not present

## 2021-02-24 DIAGNOSIS — R2689 Other abnormalities of gait and mobility: Secondary | ICD-10-CM | POA: Diagnosis not present

## 2021-02-24 DIAGNOSIS — M6281 Muscle weakness (generalized): Secondary | ICD-10-CM | POA: Diagnosis not present

## 2021-02-28 DIAGNOSIS — R2689 Other abnormalities of gait and mobility: Secondary | ICD-10-CM | POA: Diagnosis not present

## 2021-02-28 DIAGNOSIS — M6281 Muscle weakness (generalized): Secondary | ICD-10-CM | POA: Diagnosis not present

## 2021-02-28 DIAGNOSIS — M5459 Other low back pain: Secondary | ICD-10-CM | POA: Diagnosis not present

## 2021-03-02 ENCOUNTER — Ambulatory Visit (INDEPENDENT_AMBULATORY_CARE_PROVIDER_SITE_OTHER): Payer: Medicare Other | Admitting: Podiatry

## 2021-03-02 ENCOUNTER — Other Ambulatory Visit: Payer: Self-pay

## 2021-03-02 ENCOUNTER — Ambulatory Visit (INDEPENDENT_AMBULATORY_CARE_PROVIDER_SITE_OTHER): Payer: Medicare Other

## 2021-03-02 DIAGNOSIS — R600 Localized edema: Secondary | ICD-10-CM | POA: Diagnosis not present

## 2021-03-02 DIAGNOSIS — R2689 Other abnormalities of gait and mobility: Secondary | ICD-10-CM | POA: Diagnosis not present

## 2021-03-02 DIAGNOSIS — M6281 Muscle weakness (generalized): Secondary | ICD-10-CM | POA: Diagnosis not present

## 2021-03-02 DIAGNOSIS — M5459 Other low back pain: Secondary | ICD-10-CM | POA: Diagnosis not present

## 2021-03-02 NOTE — Progress Notes (Signed)
   Subjective:  Patient presents today status post triple arthrodesis left. DOS: 12/25/2019.  Patient recently had back surgery 01/17/2021.  She states that she noticed increased swelling of her left lower extremity after surgery.  She presents for further treatment evaluation  Past Medical History:  Diagnosis Date  . Anemia    hx of in 20's  . Arthritis   . Asthma   . Breast mass, left 2003  . Chronic back pain    seeing Pain specialist  . Complication of anesthesia    "shallow breathing with general"  . DDD (degenerative disc disease)   . Diabetes mellitus    borderline  . DJD (degenerative joint disease)   . GERD (gastroesophageal reflux disease)   . H/O scarlet fever   . Hemorrhoids   . History of chicken pox   . History of measles, mumps, or rubella as child   all 3  . Hx of bladder infections   . Hypothyroidism   . Irritable bowel syndrome   . Lumbar stenosis    RFA done May 2015  . Menopausal symptoms 2003  . Obesity, morbid (HCC) 2006  . Pericarditis    hx of  . Pneumonia    hx of 5 years ago  . Polyp of colon    removed during colonoscopy  . Spinal headache    x1  . Trichomonas   . Urge incontinence 2005  . Vertigo    over a year was last episode  . Yeast in stool    Objective: Physical Exam General: The patient is alert and oriented x3 in no acute distress.  Dermatology: Skin incisions are completely healed.  Skin is cool, dry and supple bilateral lower extremities. Negative for open lesions or macerations.  Vascular: Palpable pedal pulses bilaterally.  There is some moderate edema throughout the foot and ankle and leg of the left lower extremity  Neurological: Epicritic and protective threshold grossly intact bilaterally.   Musculoskeletal Exam: Status post triple arthrodesis with routine healing left.  Ankle joint range of motion within normal limits.  Negative for any pain on palpation throughout the foot  Radiographic exam: Orthopedic hardware  intact.  Good osseous union of the subtalar, CCA, and TN joints.  No fractures identified  Assessment: 1. s/p triple arthrodesis left. DOS: 12/25/2019 2.  Edema left lower extremity  Plan of Care:  1. Patient was evaluated.   2.  Recommend Jobst knee-high compression socks daily 3.  Continue wearing good supportive shoes and sneakers 4.  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   Felecia Shelling, DPM [facility]  Dr. Felecia Shelling, DPM    [address] [place], [place] [address]                Office [phone]  Fax [phone]

## 2021-03-07 ENCOUNTER — Ambulatory Visit: Payer: Medicare Other | Admitting: Podiatry

## 2021-03-07 DIAGNOSIS — M5459 Other low back pain: Secondary | ICD-10-CM | POA: Diagnosis not present

## 2021-03-07 DIAGNOSIS — M6281 Muscle weakness (generalized): Secondary | ICD-10-CM | POA: Diagnosis not present

## 2021-03-07 DIAGNOSIS — R2689 Other abnormalities of gait and mobility: Secondary | ICD-10-CM | POA: Diagnosis not present

## 2021-03-09 DIAGNOSIS — M6281 Muscle weakness (generalized): Secondary | ICD-10-CM | POA: Diagnosis not present

## 2021-03-09 DIAGNOSIS — R2689 Other abnormalities of gait and mobility: Secondary | ICD-10-CM | POA: Diagnosis not present

## 2021-03-09 DIAGNOSIS — M5459 Other low back pain: Secondary | ICD-10-CM | POA: Diagnosis not present

## 2021-03-09 DIAGNOSIS — G894 Chronic pain syndrome: Secondary | ICD-10-CM | POA: Diagnosis not present

## 2021-03-09 DIAGNOSIS — M7918 Myalgia, other site: Secondary | ICD-10-CM | POA: Diagnosis not present

## 2021-03-09 DIAGNOSIS — M542 Cervicalgia: Secondary | ICD-10-CM | POA: Diagnosis not present

## 2021-03-09 DIAGNOSIS — M4326 Fusion of spine, lumbar region: Secondary | ICD-10-CM | POA: Diagnosis not present

## 2021-03-14 DIAGNOSIS — M5459 Other low back pain: Secondary | ICD-10-CM | POA: Diagnosis not present

## 2021-03-14 DIAGNOSIS — M6281 Muscle weakness (generalized): Secondary | ICD-10-CM | POA: Diagnosis not present

## 2021-03-14 DIAGNOSIS — R2689 Other abnormalities of gait and mobility: Secondary | ICD-10-CM | POA: Diagnosis not present

## 2021-03-16 DIAGNOSIS — M5459 Other low back pain: Secondary | ICD-10-CM | POA: Diagnosis not present

## 2021-03-16 DIAGNOSIS — R2689 Other abnormalities of gait and mobility: Secondary | ICD-10-CM | POA: Diagnosis not present

## 2021-03-16 DIAGNOSIS — M6281 Muscle weakness (generalized): Secondary | ICD-10-CM | POA: Diagnosis not present

## 2021-03-21 DIAGNOSIS — M5459 Other low back pain: Secondary | ICD-10-CM | POA: Diagnosis not present

## 2021-03-21 DIAGNOSIS — M6281 Muscle weakness (generalized): Secondary | ICD-10-CM | POA: Diagnosis not present

## 2021-03-21 DIAGNOSIS — R2689 Other abnormalities of gait and mobility: Secondary | ICD-10-CM | POA: Diagnosis not present

## 2021-03-23 DIAGNOSIS — M6281 Muscle weakness (generalized): Secondary | ICD-10-CM | POA: Diagnosis not present

## 2021-03-23 DIAGNOSIS — M5459 Other low back pain: Secondary | ICD-10-CM | POA: Diagnosis not present

## 2021-03-23 DIAGNOSIS — R2689 Other abnormalities of gait and mobility: Secondary | ICD-10-CM | POA: Diagnosis not present

## 2021-03-28 DIAGNOSIS — M6281 Muscle weakness (generalized): Secondary | ICD-10-CM | POA: Diagnosis not present

## 2021-03-28 DIAGNOSIS — R2689 Other abnormalities of gait and mobility: Secondary | ICD-10-CM | POA: Diagnosis not present

## 2021-03-28 DIAGNOSIS — M5459 Other low back pain: Secondary | ICD-10-CM | POA: Diagnosis not present

## 2021-03-30 DIAGNOSIS — M6281 Muscle weakness (generalized): Secondary | ICD-10-CM | POA: Diagnosis not present

## 2021-03-30 DIAGNOSIS — R2689 Other abnormalities of gait and mobility: Secondary | ICD-10-CM | POA: Diagnosis not present

## 2021-03-30 DIAGNOSIS — M5459 Other low back pain: Secondary | ICD-10-CM | POA: Diagnosis not present

## 2021-04-04 DIAGNOSIS — M5459 Other low back pain: Secondary | ICD-10-CM | POA: Diagnosis not present

## 2021-04-04 DIAGNOSIS — R2689 Other abnormalities of gait and mobility: Secondary | ICD-10-CM | POA: Diagnosis not present

## 2021-04-04 DIAGNOSIS — M6281 Muscle weakness (generalized): Secondary | ICD-10-CM | POA: Diagnosis not present

## 2021-04-06 DIAGNOSIS — M5459 Other low back pain: Secondary | ICD-10-CM | POA: Diagnosis not present

## 2021-04-06 DIAGNOSIS — R2689 Other abnormalities of gait and mobility: Secondary | ICD-10-CM | POA: Diagnosis not present

## 2021-04-06 DIAGNOSIS — M6281 Muscle weakness (generalized): Secondary | ICD-10-CM | POA: Diagnosis not present

## 2021-04-15 DIAGNOSIS — M5459 Other low back pain: Secondary | ICD-10-CM | POA: Diagnosis not present

## 2021-04-15 DIAGNOSIS — M6281 Muscle weakness (generalized): Secondary | ICD-10-CM | POA: Diagnosis not present

## 2021-04-15 DIAGNOSIS — R2689 Other abnormalities of gait and mobility: Secondary | ICD-10-CM | POA: Diagnosis not present

## 2021-04-20 DIAGNOSIS — R2689 Other abnormalities of gait and mobility: Secondary | ICD-10-CM | POA: Diagnosis not present

## 2021-04-20 DIAGNOSIS — M5459 Other low back pain: Secondary | ICD-10-CM | POA: Diagnosis not present

## 2021-04-20 DIAGNOSIS — M4326 Fusion of spine, lumbar region: Secondary | ICD-10-CM | POA: Diagnosis not present

## 2021-04-20 DIAGNOSIS — M6281 Muscle weakness (generalized): Secondary | ICD-10-CM | POA: Diagnosis not present

## 2021-04-22 DIAGNOSIS — R2689 Other abnormalities of gait and mobility: Secondary | ICD-10-CM | POA: Diagnosis not present

## 2021-04-22 DIAGNOSIS — M6281 Muscle weakness (generalized): Secondary | ICD-10-CM | POA: Diagnosis not present

## 2021-04-22 DIAGNOSIS — M5459 Other low back pain: Secondary | ICD-10-CM | POA: Diagnosis not present

## 2021-04-27 DIAGNOSIS — M5459 Other low back pain: Secondary | ICD-10-CM | POA: Diagnosis not present

## 2021-04-27 DIAGNOSIS — R2689 Other abnormalities of gait and mobility: Secondary | ICD-10-CM | POA: Diagnosis not present

## 2021-04-27 DIAGNOSIS — M6281 Muscle weakness (generalized): Secondary | ICD-10-CM | POA: Diagnosis not present

## 2021-04-29 DIAGNOSIS — M6281 Muscle weakness (generalized): Secondary | ICD-10-CM | POA: Diagnosis not present

## 2021-04-29 DIAGNOSIS — R2689 Other abnormalities of gait and mobility: Secondary | ICD-10-CM | POA: Diagnosis not present

## 2021-04-29 DIAGNOSIS — M5459 Other low back pain: Secondary | ICD-10-CM | POA: Diagnosis not present

## 2021-05-02 DIAGNOSIS — M6281 Muscle weakness (generalized): Secondary | ICD-10-CM | POA: Diagnosis not present

## 2021-05-02 DIAGNOSIS — M5459 Other low back pain: Secondary | ICD-10-CM | POA: Diagnosis not present

## 2021-05-02 DIAGNOSIS — R2689 Other abnormalities of gait and mobility: Secondary | ICD-10-CM | POA: Diagnosis not present

## 2021-05-04 DIAGNOSIS — M5459 Other low back pain: Secondary | ICD-10-CM | POA: Diagnosis not present

## 2021-05-04 DIAGNOSIS — M6281 Muscle weakness (generalized): Secondary | ICD-10-CM | POA: Diagnosis not present

## 2021-05-04 DIAGNOSIS — R2689 Other abnormalities of gait and mobility: Secondary | ICD-10-CM | POA: Diagnosis not present

## 2021-05-09 DIAGNOSIS — M6281 Muscle weakness (generalized): Secondary | ICD-10-CM | POA: Diagnosis not present

## 2021-05-09 DIAGNOSIS — R2689 Other abnormalities of gait and mobility: Secondary | ICD-10-CM | POA: Diagnosis not present

## 2021-05-09 DIAGNOSIS — M5459 Other low back pain: Secondary | ICD-10-CM | POA: Diagnosis not present

## 2021-05-11 DIAGNOSIS — R2689 Other abnormalities of gait and mobility: Secondary | ICD-10-CM | POA: Diagnosis not present

## 2021-05-11 DIAGNOSIS — M5459 Other low back pain: Secondary | ICD-10-CM | POA: Diagnosis not present

## 2021-05-11 DIAGNOSIS — M6281 Muscle weakness (generalized): Secondary | ICD-10-CM | POA: Diagnosis not present

## 2021-05-13 DIAGNOSIS — M7918 Myalgia, other site: Secondary | ICD-10-CM | POA: Diagnosis not present

## 2021-05-13 DIAGNOSIS — M4326 Fusion of spine, lumbar region: Secondary | ICD-10-CM | POA: Diagnosis not present

## 2021-05-13 DIAGNOSIS — M542 Cervicalgia: Secondary | ICD-10-CM | POA: Diagnosis not present

## 2021-05-16 DIAGNOSIS — R2689 Other abnormalities of gait and mobility: Secondary | ICD-10-CM | POA: Diagnosis not present

## 2021-05-16 DIAGNOSIS — M5459 Other low back pain: Secondary | ICD-10-CM | POA: Diagnosis not present

## 2021-05-16 DIAGNOSIS — M6281 Muscle weakness (generalized): Secondary | ICD-10-CM | POA: Diagnosis not present

## 2021-05-18 DIAGNOSIS — R2689 Other abnormalities of gait and mobility: Secondary | ICD-10-CM | POA: Diagnosis not present

## 2021-05-18 DIAGNOSIS — M6281 Muscle weakness (generalized): Secondary | ICD-10-CM | POA: Diagnosis not present

## 2021-05-18 DIAGNOSIS — M5459 Other low back pain: Secondary | ICD-10-CM | POA: Diagnosis not present

## 2021-05-25 DIAGNOSIS — M6281 Muscle weakness (generalized): Secondary | ICD-10-CM | POA: Diagnosis not present

## 2021-05-25 DIAGNOSIS — M5459 Other low back pain: Secondary | ICD-10-CM | POA: Diagnosis not present

## 2021-05-25 DIAGNOSIS — R2689 Other abnormalities of gait and mobility: Secondary | ICD-10-CM | POA: Diagnosis not present

## 2021-05-27 DIAGNOSIS — M6281 Muscle weakness (generalized): Secondary | ICD-10-CM | POA: Diagnosis not present

## 2021-05-27 DIAGNOSIS — M5459 Other low back pain: Secondary | ICD-10-CM | POA: Diagnosis not present

## 2021-05-27 DIAGNOSIS — R2689 Other abnormalities of gait and mobility: Secondary | ICD-10-CM | POA: Diagnosis not present

## 2021-05-27 IMAGING — CT CT L SPINE W/ CM
1 of 7 series · 5 of 14 positions shown, 7 images · non-contrast
Comparison: 08/13/2017

CLINICAL DATA: Right-sided low back pain radiating into the right
leg with numbness.
TECHNIQUE: Contiguous axial images were obtained through the Lumbar spine after
the intrathecal infusion of contrast. Coronal and sagittal
reconstructions were obtained of the axial image sets.

[Series 3: l spine soft · axial · 0.40mm/px · z∈[-893,-737]mm · 5 of 79 slices shown, 7 images]
[im 14/79  soft-tissue]
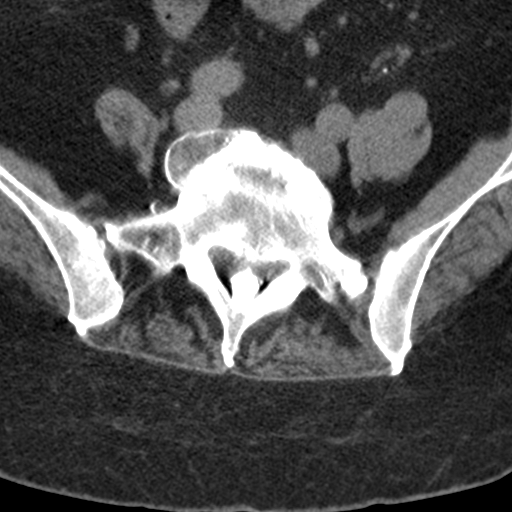
[im 14/79  bone]
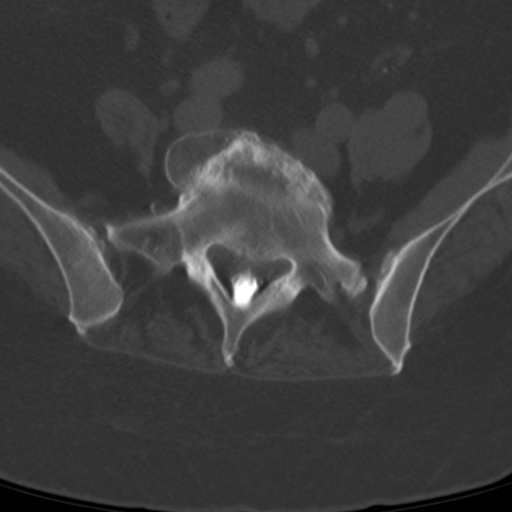
[im 27/79  bone]
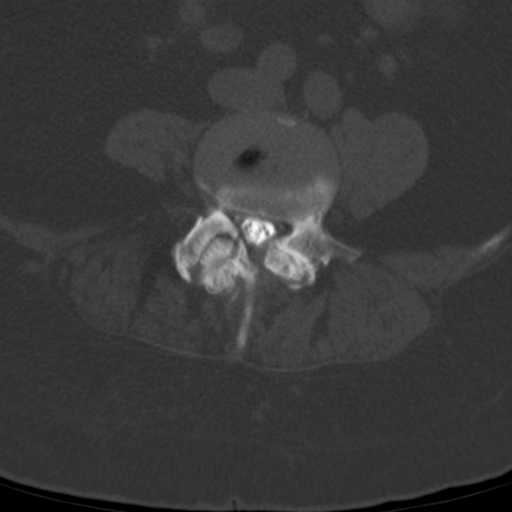
[im 40/79  bone]
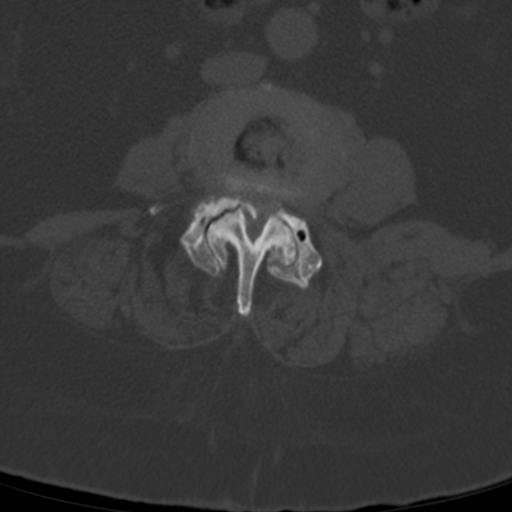
[im 53/79  bone]
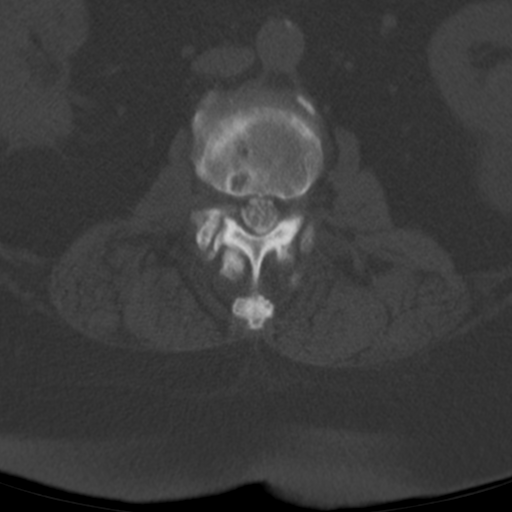
[im 66/79  soft-tissue]
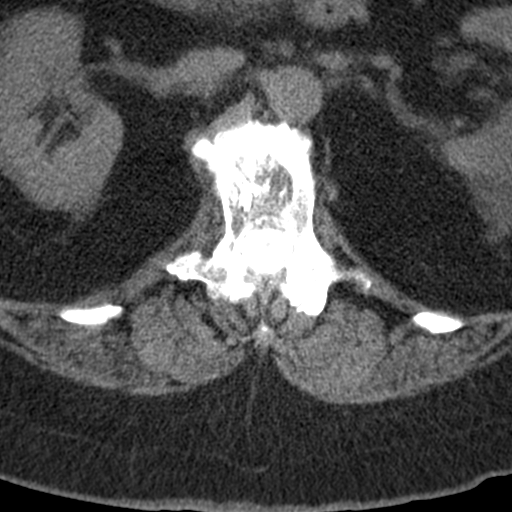
[im 66/79  bone]
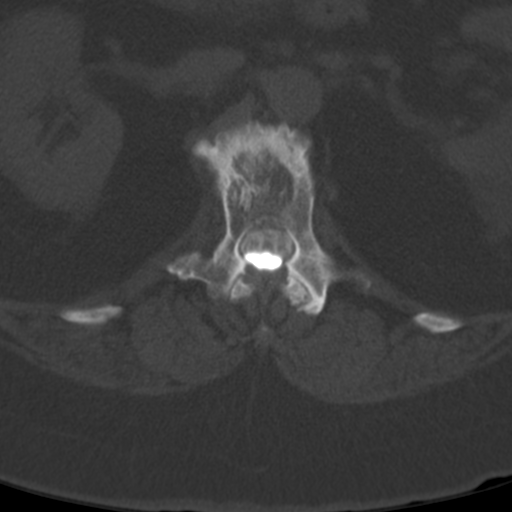

[5 of 14 positions shown; findings below may reference images not displayed]

EXAM:
LUMBAR MYELOGRAM

FLUOROSCOPY TIME:  Fluoroscopy Time: 1 minutes 9 seconds

Radiation Exposure Index: 740.43 microGray*m^2

PROCEDURE:
After thorough discussion of risks and benefits of the procedure
including bleeding, infection, injury to nerves, blood vessels,
adjacent structures as well as headache and CSF leak, written and
oral informed consent was obtained. Consent was obtained by Dr.
Sai Kwan Ng Ng. Time out form was completed.

Patient was positioned prone on the fluoroscopy table. Local
anesthesia was provided with 1% lidocaine without epinephrine after
prepped and draped in the usual sterile fashion. Puncture was
performed at L5-S1 using a 5 inch 22-gauge spinal needle via a left
interlaminar approach. Using a single pass through the dura, the
needle was placed within the thecal sac, with return of clear CSF.
15 mL of Isovue B-LYY was injected into the thecal sac, with normal
opacification of the nerve roots and cauda equina consistent with
free flow within the subarachnoid space.

I personally performed the lumbar puncture and administered the
intrathecal contrast. I also personally supervised acquisition of
the myelogram images.
FINDINGS: LUMBAR MYELOGRAM FINDINGS:

There is mildly transitional lumbosacral anatomy with a rudimentary
disc at S1-2. A mixed injection is noted with a small amount of
subdural contrast, however there is excellent subarachnoid
opacification.

Trace retrolisthesis of T12 on L1 and L1 on L2, grade 1
anterolisthesis of L3 on L4, and trace anterolisthesis of L4 on L5
do not significantly change with flexion or extension. However, as
noted on the prior myelogram the anterolisthesis of L3 on L4 does
slightly increase between standing and prone positioning. There is
evidence of severe spinal stenosis at L3-4, and there is evidence of
milder spinal stenosis as well as bilateral lateral recess stenosis
at L2-3. A sacral neural stimulator is noted.

CT LUMBAR MYELOGRAM FINDINGS:

Trace retrolisthesis of L1 on L2 and L2 on L3, 4 mm anterolisthesis
of L3 on L4, and trace anterolisthesis of L4 on L5 are unchanged
from the prior CT. There is mild lumbar levoscoliosis. No fracture
or suspicious osseous lesion is identified. There is an unchanged
hemangioma in the L1 vertebral body. The conus medullaris terminates
at L1. Asymmetrically advanced left SI joint arthritis is noted. A
cyst is partially visualized in the left kidney measuring at least 6
cm in size.

T12-L1: Mild disc space narrowing. Mild right eccentric disc
bulging, a small right foraminal to extraforaminal disc protrusion,
and prominent posterior left facet spurring without stenosis. The
disc protrusion may be slightly larger than on the prior study.

L1-2: Mild disc space narrowing and vacuum disc. Disc bulging and
moderate to severe facet arthrosis without significant stenosis,
unchanged.

L2-3: Moderate disc space narrowing and vacuum disc. Circumferential
disc bulging, endplate spurring, and severe facet hypertrophy result
in moderate spinal stenosis, moderate bilateral lateral recess
stenosis, and moderate left greater than right neural foraminal
stenosis, overall mildly progressed from prior.

L3-4: Mild disc space narrowing and vacuum disc. Anterolisthesis
with bulging uncovered disc, ligamentum flavum thickening and
calcification, and severe facet hypertrophy result in progressive
severe spinal stenosis (5 mm AP diameter of the thecal sac) and
moderate to severe left greater than right neural foraminal
stenosis. Potential left L3 and bilateral L4 nerve root impingement.

L4-5: Vacuum disc. Mild disc bulging eccentric to the left, a
shallow left paracentral and subarticular disc protrusion, and
severe right and moderate left facet hypertrophy result in
progressive moderate left and mild right lateral recess stenosis and
mild right neural foraminal stenosis without spinal stenosis.

L5-S1: Unchanged moderate disc space narrowing with a large, solid
bridging anterior vertebral osteophyte on the right. Mild disc
bulging, right foraminal endplate spurring, and moderate facet
hypertrophy result in mild right neural foraminal stenosis and
borderline to mild bilateral lateral recess stenosis without spinal
stenosis, unchanged.
IMPRESSION: 1. Progressive, severe spinal stenosis and moderate to severe neural
foraminal stenosis at L3-4.
2. Mild progression of moderate spinal and neural foraminal stenosis
at L2-3.
3. Mild progression of moderate left and mild right lateral recess
stenosis at L4-5.

## 2021-05-30 DIAGNOSIS — M5459 Other low back pain: Secondary | ICD-10-CM | POA: Diagnosis not present

## 2021-05-30 DIAGNOSIS — M6281 Muscle weakness (generalized): Secondary | ICD-10-CM | POA: Diagnosis not present

## 2021-05-30 DIAGNOSIS — R2689 Other abnormalities of gait and mobility: Secondary | ICD-10-CM | POA: Diagnosis not present

## 2021-05-31 DIAGNOSIS — Z1231 Encounter for screening mammogram for malignant neoplasm of breast: Secondary | ICD-10-CM | POA: Diagnosis not present

## 2021-05-31 DIAGNOSIS — Z01419 Encounter for gynecological examination (general) (routine) without abnormal findings: Secondary | ICD-10-CM | POA: Diagnosis not present

## 2021-05-31 DIAGNOSIS — N6310 Unspecified lump in the right breast, unspecified quadrant: Secondary | ICD-10-CM | POA: Diagnosis not present

## 2021-05-31 DIAGNOSIS — Z1211 Encounter for screening for malignant neoplasm of colon: Secondary | ICD-10-CM | POA: Diagnosis not present

## 2021-05-31 DIAGNOSIS — N3281 Overactive bladder: Secondary | ICD-10-CM | POA: Diagnosis not present

## 2021-05-31 DIAGNOSIS — Z6841 Body Mass Index (BMI) 40.0 and over, adult: Secondary | ICD-10-CM | POA: Diagnosis not present

## 2021-06-03 DIAGNOSIS — R2689 Other abnormalities of gait and mobility: Secondary | ICD-10-CM | POA: Diagnosis not present

## 2021-06-03 DIAGNOSIS — M5459 Other low back pain: Secondary | ICD-10-CM | POA: Diagnosis not present

## 2021-06-03 DIAGNOSIS — M6281 Muscle weakness (generalized): Secondary | ICD-10-CM | POA: Diagnosis not present

## 2021-06-06 DIAGNOSIS — M5459 Other low back pain: Secondary | ICD-10-CM | POA: Diagnosis not present

## 2021-06-06 DIAGNOSIS — M6281 Muscle weakness (generalized): Secondary | ICD-10-CM | POA: Diagnosis not present

## 2021-06-06 DIAGNOSIS — R2689 Other abnormalities of gait and mobility: Secondary | ICD-10-CM | POA: Diagnosis not present

## 2021-06-23 DIAGNOSIS — N3941 Urge incontinence: Secondary | ICD-10-CM | POA: Diagnosis not present

## 2021-07-13 DIAGNOSIS — E669 Obesity, unspecified: Secondary | ICD-10-CM | POA: Diagnosis not present

## 2021-07-13 DIAGNOSIS — E119 Type 2 diabetes mellitus without complications: Secondary | ICD-10-CM | POA: Diagnosis not present

## 2021-07-13 DIAGNOSIS — N3941 Urge incontinence: Secondary | ICD-10-CM | POA: Diagnosis not present

## 2021-07-13 DIAGNOSIS — Z7984 Long term (current) use of oral hypoglycemic drugs: Secondary | ICD-10-CM | POA: Diagnosis not present

## 2021-07-13 DIAGNOSIS — Z6841 Body Mass Index (BMI) 40.0 and over, adult: Secondary | ICD-10-CM | POA: Diagnosis not present

## 2021-07-13 DIAGNOSIS — Z4542 Encounter for adjustment and management of neuropacemaker (brain) (peripheral nerve) (spinal cord): Secondary | ICD-10-CM | POA: Diagnosis not present

## 2021-07-21 DIAGNOSIS — Z79891 Long term (current) use of opiate analgesic: Secondary | ICD-10-CM | POA: Diagnosis not present

## 2021-07-21 DIAGNOSIS — G894 Chronic pain syndrome: Secondary | ICD-10-CM | POA: Diagnosis not present

## 2021-07-21 DIAGNOSIS — M4326 Fusion of spine, lumbar region: Secondary | ICD-10-CM | POA: Diagnosis not present

## 2021-07-21 DIAGNOSIS — Z79899 Other long term (current) drug therapy: Secondary | ICD-10-CM | POA: Diagnosis not present

## 2021-07-21 DIAGNOSIS — M545 Low back pain, unspecified: Secondary | ICD-10-CM | POA: Diagnosis not present

## 2021-07-21 DIAGNOSIS — N3941 Urge incontinence: Secondary | ICD-10-CM | POA: Diagnosis not present

## 2021-08-05 DIAGNOSIS — N3941 Urge incontinence: Secondary | ICD-10-CM | POA: Diagnosis not present

## 2021-08-09 DIAGNOSIS — N6313 Unspecified lump in the right breast, lower outer quadrant: Secondary | ICD-10-CM | POA: Diagnosis not present

## 2021-08-09 DIAGNOSIS — N6310 Unspecified lump in the right breast, unspecified quadrant: Secondary | ICD-10-CM | POA: Diagnosis not present

## 2021-08-24 DIAGNOSIS — R110 Nausea: Secondary | ICD-10-CM | POA: Diagnosis not present

## 2021-08-24 DIAGNOSIS — R42 Dizziness and giddiness: Secondary | ICD-10-CM | POA: Diagnosis not present

## 2021-08-24 DIAGNOSIS — R82998 Other abnormal findings in urine: Secondary | ICD-10-CM | POA: Diagnosis not present

## 2021-09-19 DIAGNOSIS — M542 Cervicalgia: Secondary | ICD-10-CM | POA: Diagnosis not present

## 2021-09-19 DIAGNOSIS — G894 Chronic pain syndrome: Secondary | ICD-10-CM | POA: Diagnosis not present

## 2021-10-03 ENCOUNTER — Telehealth: Payer: Self-pay | Admitting: *Deleted

## 2021-10-03 NOTE — Telephone Encounter (Signed)
Patient is having really bad tingling sensation in foot and ankle , had surgery about 1 year ago.Please schedule for an appointment for f/u.

## 2021-10-06 DIAGNOSIS — E785 Hyperlipidemia, unspecified: Secondary | ICD-10-CM | POA: Diagnosis not present

## 2021-10-06 DIAGNOSIS — E1140 Type 2 diabetes mellitus with diabetic neuropathy, unspecified: Secondary | ICD-10-CM | POA: Diagnosis not present

## 2021-10-06 DIAGNOSIS — Z79899 Other long term (current) drug therapy: Secondary | ICD-10-CM | POA: Diagnosis not present

## 2021-10-06 DIAGNOSIS — R6889 Other general symptoms and signs: Secondary | ICD-10-CM | POA: Diagnosis not present

## 2021-10-06 DIAGNOSIS — J069 Acute upper respiratory infection, unspecified: Secondary | ICD-10-CM | POA: Diagnosis not present

## 2021-10-06 DIAGNOSIS — E039 Hypothyroidism, unspecified: Secondary | ICD-10-CM | POA: Diagnosis not present

## 2021-10-10 ENCOUNTER — Ambulatory Visit (INDEPENDENT_AMBULATORY_CARE_PROVIDER_SITE_OTHER): Payer: Medicare Other | Admitting: Podiatry

## 2021-10-10 ENCOUNTER — Other Ambulatory Visit: Payer: Self-pay

## 2021-10-10 ENCOUNTER — Ambulatory Visit (INDEPENDENT_AMBULATORY_CARE_PROVIDER_SITE_OTHER): Payer: Medicare Other

## 2021-10-10 DIAGNOSIS — Z9889 Other specified postprocedural states: Secondary | ICD-10-CM | POA: Diagnosis not present

## 2021-10-10 DIAGNOSIS — G5792 Unspecified mononeuropathy of left lower limb: Secondary | ICD-10-CM | POA: Diagnosis not present

## 2021-10-10 NOTE — Progress Notes (Signed)
   Subjective:  Patient presents today status post triple arthrodesis left. DOS: 12/25/2019.  Patient recently had back surgery 01/17/2021.  Patient states that she experiences tingling with numbness to the left foot.  This may be coming from her back or from the surgery.  She says the tingling does not extend up into the calves or thigh or buttocks.  She currently takes gabapentin 100 mg 2 times daily from her prescribing physician.  Past Medical History:  Diagnosis Date   Anemia    hx of in 20's   Arthritis    Asthma    Breast mass, left 2003   Chronic back pain    seeing Pain specialist   Complication of anesthesia    "shallow breathing with general"   DDD (degenerative disc disease)    Diabetes mellitus    borderline   DJD (degenerative joint disease)    GERD (gastroesophageal reflux disease)    H/O scarlet fever    Hemorrhoids    History of chicken pox    History of measles, mumps, or rubella as child   all 3   Hx of bladder infections    Hypothyroidism    Irritable bowel syndrome    Lumbar stenosis    RFA done May 2015   Menopausal symptoms 2003   Obesity, morbid (HCC) 2006   Pericarditis    hx of   Pneumonia    hx of 5 years ago   Polyp of colon    removed during colonoscopy   Spinal headache    x1   Trichomonas    Urge incontinence 2005   Vertigo    over a year was last episode   Yeast in stool    Objective: Physical Exam General: The patient is alert and oriented x3 in no acute distress.  Dermatology: Skin incisions are completely healed.  Skin is cool, dry and supple bilateral lower extremities. Negative for open lesions or macerations.  Vascular: Palpable pedal pulses bilaterally.  Negative edema noted.  Neurological: Epicritic and protective threshold diminished bilaterally.   Musculoskeletal Exam: Status post triple arthrodesis with routine healing left.  Ankle joint range of motion within normal limits.  Stable foot noted with negative crepitus or  range of motion to the subtalar or TN joint.  Radiographic exam: Orthopedic hardware intact.  Good osseous union of the subtalar, CCA, and TN joints.  No fractures identified  Assessment: 1. s/p triple arthrodesis left. DOS: 12/25/2019 2.  Paresthesia left foot  Plan of Care:  1. Patient was evaluated.   2.  Continue Jobst knee-high compression socks daily 3.  Continue wearing good supportive shoes and sneakers 4.  Recommend that the patient perhaps increase her dose of gabapentin from 100 mg 2 times daily to 300 mg 2 times daily.  I will defer this to the PCP since she is the prescribing physician 5.  Recommend topical Voltaren daily to the foot 6.  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   Felecia Shelling, DPM [facility]  Dr. Felecia Shelling, DPM    [address] [place], [place] [address]                Office [phone]  Fax [phone]

## 2021-10-21 DIAGNOSIS — M791 Myalgia, unspecified site: Secondary | ICD-10-CM | POA: Diagnosis not present

## 2021-10-21 DIAGNOSIS — M4326 Fusion of spine, lumbar region: Secondary | ICD-10-CM | POA: Diagnosis not present

## 2021-10-21 DIAGNOSIS — G894 Chronic pain syndrome: Secondary | ICD-10-CM | POA: Diagnosis not present

## 2021-11-04 DIAGNOSIS — N390 Urinary tract infection, site not specified: Secondary | ICD-10-CM | POA: Diagnosis not present

## 2021-11-04 DIAGNOSIS — R300 Dysuria: Secondary | ICD-10-CM | POA: Diagnosis not present

## 2021-11-04 DIAGNOSIS — N3941 Urge incontinence: Secondary | ICD-10-CM | POA: Diagnosis not present

## 2021-11-28 DIAGNOSIS — N3941 Urge incontinence: Secondary | ICD-10-CM | POA: Diagnosis not present

## 2021-11-28 DIAGNOSIS — Z23 Encounter for immunization: Secondary | ICD-10-CM | POA: Diagnosis not present

## 2021-12-07 DIAGNOSIS — E1140 Type 2 diabetes mellitus with diabetic neuropathy, unspecified: Secondary | ICD-10-CM | POA: Diagnosis not present

## 2021-12-07 DIAGNOSIS — E1122 Type 2 diabetes mellitus with diabetic chronic kidney disease: Secondary | ICD-10-CM | POA: Diagnosis not present

## 2021-12-07 DIAGNOSIS — E039 Hypothyroidism, unspecified: Secondary | ICD-10-CM | POA: Diagnosis not present

## 2021-12-07 DIAGNOSIS — E785 Hyperlipidemia, unspecified: Secondary | ICD-10-CM | POA: Diagnosis not present

## 2021-12-07 DIAGNOSIS — Z79899 Other long term (current) drug therapy: Secondary | ICD-10-CM | POA: Diagnosis not present

## 2021-12-14 DIAGNOSIS — N3281 Overactive bladder: Secondary | ICD-10-CM | POA: Diagnosis not present

## 2021-12-14 DIAGNOSIS — E039 Hypothyroidism, unspecified: Secondary | ICD-10-CM | POA: Diagnosis not present

## 2021-12-14 DIAGNOSIS — Z23 Encounter for immunization: Secondary | ICD-10-CM | POA: Diagnosis not present

## 2021-12-14 DIAGNOSIS — E119 Type 2 diabetes mellitus without complications: Secondary | ICD-10-CM | POA: Diagnosis not present

## 2021-12-14 DIAGNOSIS — Z9109 Other allergy status, other than to drugs and biological substances: Secondary | ICD-10-CM | POA: Diagnosis not present

## 2021-12-14 DIAGNOSIS — Z0000 Encounter for general adult medical examination without abnormal findings: Secondary | ICD-10-CM | POA: Diagnosis not present

## 2021-12-26 DIAGNOSIS — M4326 Fusion of spine, lumbar region: Secondary | ICD-10-CM | POA: Diagnosis not present

## 2021-12-26 DIAGNOSIS — Z6841 Body Mass Index (BMI) 40.0 and over, adult: Secondary | ICD-10-CM | POA: Diagnosis not present

## 2021-12-26 DIAGNOSIS — M791 Myalgia, unspecified site: Secondary | ICD-10-CM | POA: Diagnosis not present

## 2021-12-26 DIAGNOSIS — G894 Chronic pain syndrome: Secondary | ICD-10-CM | POA: Diagnosis not present

## 2022-01-03 ENCOUNTER — Other Ambulatory Visit: Payer: Self-pay | Admitting: Rehabilitation

## 2022-01-03 DIAGNOSIS — M48062 Spinal stenosis, lumbar region with neurogenic claudication: Secondary | ICD-10-CM

## 2022-01-15 ENCOUNTER — Ambulatory Visit
Admission: RE | Admit: 2022-01-15 | Discharge: 2022-01-15 | Disposition: A | Discharge status: Home / Self Care | Payer: Medicare Other | Source: Ambulatory Visit | Attending: Rehabilitation | Admitting: Rehabilitation

## 2022-01-15 ENCOUNTER — Other Ambulatory Visit: Payer: Self-pay

## 2022-01-15 DIAGNOSIS — M48062 Spinal stenosis, lumbar region with neurogenic claudication: Secondary | ICD-10-CM

## 2022-01-17 ENCOUNTER — Ambulatory Visit
Admission: RE | Admit: 2022-01-17 | Discharge: 2022-01-17 | Disposition: A | Discharge status: Home / Self Care | Payer: Medicare Other | Source: Ambulatory Visit | Attending: Rehabilitation | Admitting: Rehabilitation

## 2022-01-17 ENCOUNTER — Other Ambulatory Visit: Payer: Self-pay

## 2022-01-17 DIAGNOSIS — M48061 Spinal stenosis, lumbar region without neurogenic claudication: Secondary | ICD-10-CM | POA: Diagnosis not present

## 2022-01-17 DIAGNOSIS — M5126 Other intervertebral disc displacement, lumbar region: Secondary | ICD-10-CM | POA: Diagnosis not present

## 2022-01-17 DIAGNOSIS — M5127 Other intervertebral disc displacement, lumbosacral region: Secondary | ICD-10-CM | POA: Diagnosis not present

## 2022-01-17 DIAGNOSIS — M4316 Spondylolisthesis, lumbar region: Secondary | ICD-10-CM | POA: Diagnosis not present

## 2022-01-17 MED ORDER — GADOBENATE DIMEGLUMINE 529 MG/ML IV SOLN
20.0000 mL | Freq: Once | INTRAVENOUS | Status: AC | PRN
  Administered 2022-01-17: 20 mL via INTRAVENOUS

## 2022-01-19 DIAGNOSIS — M4326 Fusion of spine, lumbar region: Secondary | ICD-10-CM | POA: Diagnosis not present

## 2022-01-19 DIAGNOSIS — M791 Myalgia, unspecified site: Secondary | ICD-10-CM | POA: Diagnosis not present

## 2022-01-19 DIAGNOSIS — Z79899 Other long term (current) drug therapy: Secondary | ICD-10-CM | POA: Diagnosis not present

## 2022-01-19 DIAGNOSIS — G894 Chronic pain syndrome: Secondary | ICD-10-CM | POA: Diagnosis not present

## 2022-01-19 DIAGNOSIS — Z79891 Long term (current) use of opiate analgesic: Secondary | ICD-10-CM | POA: Diagnosis not present

## 2022-01-19 DIAGNOSIS — M47816 Spondylosis without myelopathy or radiculopathy, lumbar region: Secondary | ICD-10-CM | POA: Diagnosis not present

## 2022-01-31 DIAGNOSIS — M47816 Spondylosis without myelopathy or radiculopathy, lumbar region: Secondary | ICD-10-CM | POA: Diagnosis not present

## 2022-02-27 DIAGNOSIS — N3941 Urge incontinence: Secondary | ICD-10-CM | POA: Diagnosis not present

## 2022-03-14 DIAGNOSIS — M47816 Spondylosis without myelopathy or radiculopathy, lumbar region: Secondary | ICD-10-CM | POA: Diagnosis not present

## 2022-03-23 DIAGNOSIS — Z20822 Contact with and (suspected) exposure to covid-19: Secondary | ICD-10-CM | POA: Diagnosis not present

## 2022-03-30 DIAGNOSIS — Z20822 Contact with and (suspected) exposure to covid-19: Secondary | ICD-10-CM | POA: Diagnosis not present

## 2022-04-06 DIAGNOSIS — E039 Hypothyroidism, unspecified: Secondary | ICD-10-CM | POA: Diagnosis not present

## 2022-04-06 DIAGNOSIS — E119 Type 2 diabetes mellitus without complications: Secondary | ICD-10-CM | POA: Diagnosis not present

## 2022-04-13 DIAGNOSIS — M5126 Other intervertebral disc displacement, lumbar region: Secondary | ICD-10-CM | POA: Diagnosis not present

## 2022-04-13 DIAGNOSIS — E118 Type 2 diabetes mellitus with unspecified complications: Secondary | ICD-10-CM | POA: Diagnosis not present

## 2022-04-13 DIAGNOSIS — F5102 Adjustment insomnia: Secondary | ICD-10-CM | POA: Diagnosis not present

## 2022-04-13 DIAGNOSIS — Z23 Encounter for immunization: Secondary | ICD-10-CM | POA: Diagnosis not present

## 2022-04-13 DIAGNOSIS — E039 Hypothyroidism, unspecified: Secondary | ICD-10-CM | POA: Diagnosis not present

## 2022-04-13 DIAGNOSIS — E785 Hyperlipidemia, unspecified: Secondary | ICD-10-CM | POA: Diagnosis not present

## 2022-04-13 DIAGNOSIS — K219 Gastro-esophageal reflux disease without esophagitis: Secondary | ICD-10-CM | POA: Diagnosis not present

## 2022-04-13 DIAGNOSIS — Z9109 Other allergy status, other than to drugs and biological substances: Secondary | ICD-10-CM | POA: Diagnosis not present

## 2022-04-24 DIAGNOSIS — M47816 Spondylosis without myelopathy or radiculopathy, lumbar region: Secondary | ICD-10-CM | POA: Diagnosis not present

## 2022-05-24 DIAGNOSIS — M4326 Fusion of spine, lumbar region: Secondary | ICD-10-CM | POA: Diagnosis not present

## 2022-05-24 DIAGNOSIS — M47816 Spondylosis without myelopathy or radiculopathy, lumbar region: Secondary | ICD-10-CM | POA: Diagnosis not present

## 2022-06-02 DIAGNOSIS — N390 Urinary tract infection, site not specified: Secondary | ICD-10-CM | POA: Diagnosis not present

## 2022-06-07 DIAGNOSIS — Z6841 Body Mass Index (BMI) 40.0 and over, adult: Secondary | ICD-10-CM | POA: Diagnosis not present

## 2022-06-07 DIAGNOSIS — Z1211 Encounter for screening for malignant neoplasm of colon: Secondary | ICD-10-CM | POA: Diagnosis not present

## 2022-06-07 DIAGNOSIS — N951 Menopausal and female climacteric states: Secondary | ICD-10-CM | POA: Diagnosis not present

## 2022-06-07 DIAGNOSIS — Z01419 Encounter for gynecological examination (general) (routine) without abnormal findings: Secondary | ICD-10-CM | POA: Diagnosis not present

## 2022-06-07 DIAGNOSIS — Z9189 Other specified personal risk factors, not elsewhere classified: Secondary | ICD-10-CM | POA: Diagnosis not present

## 2022-06-07 DIAGNOSIS — Z1231 Encounter for screening mammogram for malignant neoplasm of breast: Secondary | ICD-10-CM | POA: Diagnosis not present

## 2022-06-19 DIAGNOSIS — M47816 Spondylosis without myelopathy or radiculopathy, lumbar region: Secondary | ICD-10-CM | POA: Diagnosis not present

## 2022-06-23 DIAGNOSIS — N3941 Urge incontinence: Secondary | ICD-10-CM | POA: Diagnosis not present

## 2022-07-17 DIAGNOSIS — Z0289 Encounter for other administrative examinations: Secondary | ICD-10-CM

## 2022-07-18 ENCOUNTER — Ambulatory Visit (INDEPENDENT_AMBULATORY_CARE_PROVIDER_SITE_OTHER): Payer: Medicare Other | Admitting: Family Medicine

## 2022-07-18 ENCOUNTER — Encounter (INDEPENDENT_AMBULATORY_CARE_PROVIDER_SITE_OTHER): Payer: Self-pay | Admitting: Family Medicine

## 2022-07-18 ENCOUNTER — Ambulatory Visit: Payer: Medicare Other | Admitting: Cardiology

## 2022-07-18 ENCOUNTER — Other Ambulatory Visit (INDEPENDENT_AMBULATORY_CARE_PROVIDER_SITE_OTHER): Payer: Self-pay | Admitting: Family Medicine

## 2022-07-18 VITALS — BP 125/83 | HR 105 | Temp 98.0°F | Ht 67.0 in | Wt 311.0 lb

## 2022-07-18 DIAGNOSIS — Z6841 Body Mass Index (BMI) 40.0 and over, adult: Secondary | ICD-10-CM | POA: Diagnosis not present

## 2022-07-18 DIAGNOSIS — R0609 Other forms of dyspnea: Secondary | ICD-10-CM | POA: Insufficient documentation

## 2022-07-18 DIAGNOSIS — E119 Type 2 diabetes mellitus without complications: Secondary | ICD-10-CM | POA: Diagnosis not present

## 2022-07-18 DIAGNOSIS — R9431 Abnormal electrocardiogram [ECG] [EKG]: Secondary | ICD-10-CM | POA: Insufficient documentation

## 2022-07-18 DIAGNOSIS — E7849 Other hyperlipidemia: Secondary | ICD-10-CM | POA: Diagnosis not present

## 2022-07-18 DIAGNOSIS — Z1331 Encounter for screening for depression: Secondary | ICD-10-CM | POA: Diagnosis not present

## 2022-07-18 DIAGNOSIS — R0602 Shortness of breath: Secondary | ICD-10-CM

## 2022-07-18 DIAGNOSIS — R5383 Other fatigue: Secondary | ICD-10-CM

## 2022-07-18 DIAGNOSIS — E559 Vitamin D deficiency, unspecified: Secondary | ICD-10-CM | POA: Diagnosis not present

## 2022-07-18 DIAGNOSIS — Z7984 Long term (current) use of oral hypoglycemic drugs: Secondary | ICD-10-CM | POA: Diagnosis not present

## 2022-07-18 DIAGNOSIS — E669 Obesity, unspecified: Secondary | ICD-10-CM | POA: Diagnosis not present

## 2022-07-19 LAB — CBC WITH DIFFERENTIAL/PLATELET
Basophils Absolute: 0.1 10*3/uL (ref 0.0–0.2)
Basos: 1 %
EOS (ABSOLUTE): 0.2 10*3/uL (ref 0.0–0.4)
Eos: 2 %
Hematocrit: 43 % (ref 34.0–46.6)
Hemoglobin: 13.8 g/dL (ref 11.1–15.9)
Immature Grans (Abs): 0.1 10*3/uL (ref 0.0–0.1)
Immature Granulocytes: 1 %
Lymphocytes Absolute: 2.2 10*3/uL (ref 0.7–3.1)
Lymphs: 23 %
MCH: 28 pg (ref 26.6–33.0)
MCHC: 32.1 g/dL (ref 31.5–35.7)
MCV: 87 fL (ref 79–97)
Monocytes Absolute: 0.5 10*3/uL (ref 0.1–0.9)
Monocytes: 5 %
Neutrophils Absolute: 6.7 10*3/uL (ref 1.4–7.0)
Neutrophils: 68 %
Platelets: 271 10*3/uL (ref 150–450)
RBC: 4.92 x10E6/uL (ref 3.77–5.28)
RDW: 14.5 % (ref 11.7–15.4)
WBC: 9.7 10*3/uL (ref 3.4–10.8)

## 2022-07-19 LAB — VITAMIN B12: Vitamin B-12: >2000 pg/mL — ABNORMAL HIGH (ref 232–1245)

## 2022-07-19 LAB — HEMOGLOBIN A1C
Est. average glucose Bld gHb Est-mCnc: 163 mg/dL
Hgb A1c MFr Bld: 7.3 % — ABNORMAL HIGH (ref 4.8–5.6)

## 2022-07-19 LAB — COMPREHENSIVE METABOLIC PANEL
ALT: 14 IU/L (ref 0–32)
AST: 16 IU/L (ref 0–40)
Albumin/Globulin Ratio: 2.1 (ref 1.2–2.2)
Albumin: 4.5 g/dL (ref 3.9–4.9)
Alkaline Phosphatase: 108 IU/L (ref 44–121)
BUN/Creatinine Ratio: 23 (ref 12–28)
BUN: 15 mg/dL (ref 8–27)
Bilirubin Total: 0.4 mg/dL (ref 0.0–1.2)
CO2: 24 mmol/L (ref 20–29)
Calcium: 9.5 mg/dL (ref 8.7–10.3)
Chloride: 99 mmol/L (ref 96–106)
Creatinine, Ser: 0.64 mg/dL (ref 0.57–1.00)
Globulin, Total: 2.1 g/dL (ref 1.5–4.5)
Glucose: 139 mg/dL — ABNORMAL HIGH (ref 70–99)
Potassium: 4.3 mmol/L (ref 3.5–5.2)
Sodium: 140 mmol/L (ref 134–144)
Total Protein: 6.6 g/dL (ref 6.0–8.5)
eGFR: 96 mL/min/{1.73_m2} (ref >59)

## 2022-07-19 LAB — LIPID PANEL
Chol/HDL Ratio: 3.2 ratio (ref 0.0–4.4)
Cholesterol, Total: 177 mg/dL (ref 100–199)
HDL: 56 mg/dL (ref >39)
LDL Chol Calc (NIH): 88 mg/dL (ref 0–99)
Triglycerides: 198 mg/dL — ABNORMAL HIGH (ref 0–149)
VLDL Cholesterol Cal: 33 mg/dL (ref 5–40)

## 2022-07-19 LAB — TSH: TSH: 3.86 u[IU]/mL (ref 0.450–4.500)

## 2022-07-19 LAB — INSULIN, RANDOM: INSULIN: 10.9 u[IU]/mL (ref 2.6–24.9)

## 2022-07-19 LAB — T4, FREE: Free T4: 1.29 ng/dL (ref 0.82–1.77)

## 2022-07-19 LAB — T3: T3, Total: 108 ng/dL (ref 71–180)

## 2022-07-19 LAB — VITAMIN D 25 HYDROXY (VIT D DEFICIENCY, FRACTURES): Vit D, 25-Hydroxy: 30.8 ng/mL (ref 30.0–100.0)

## 2022-07-19 LAB — FOLATE: Folate: >20 ng/mL (ref >3.0)

## 2022-07-25 DIAGNOSIS — M47816 Spondylosis without myelopathy or radiculopathy, lumbar region: Secondary | ICD-10-CM | POA: Diagnosis not present

## 2022-08-01 NOTE — Progress Notes (Signed)
Chief Complaint:   OBESITY Kelly Taylor (MR# [id number]) is a 71 y.o. female who presents for evaluation and treatment of obesity and related comorbidities. Current BMI is Body mass index is 48.71 kg/m. Kelly Taylor has been struggling with her weight for many years and has been unsuccessful in either losing weight, maintaining weight loss, or reaching her healthy weight goal.  Kelly Taylor started to gain weight at 71 years old when she moved to [place].  Gaining weight since loss of her husband 2 years ago.  She was 265 pounds prior to that.  Brother lives with her, poor eating habits.  They share cooking.  Likes salads and makes good food choices.  Likes fruit and veggies.  Mindlessly snacks.  Less physically active.  Never used AOM.  Kelly Taylor is currently in the action stage of change and ready to dedicate time achieving and maintaining a healthier weight. Kelly Taylor is interested in becoming our patient and working on intensive lifestyle modifications including (but not limited to) diet and exercise for weight loss.  Kelly Taylor's habits were reviewed today and are as follows: Her family eats meals together, her desired weight loss is 146 lbs, she has been heavy most of her life, she started gaining weight since 71 years old, her heaviest weight ever was 320 pounds, she has significant food cravings issues, she snacks frequently in the evenings, she skips meals frequently, she is frequently drinking liquids with calories, she has problems with excessive hunger, she frequently eats larger portions than normal, and she struggles with emotional eating.  Depression Screen Kelly Taylor's Food and Mood (modified PHQ-9) score was 7.     07/18/2022    8:43 AM  Depression screen PHQ 2/9  Decreased Interest 1  Down, Depressed, Hopeless 1  PHQ - 2 Score 2  Altered sleeping 0  Tired, decreased energy 2  Change in appetite 1  Feeling bad or failure about yourself  1  Trouble concentrating 1  Moving slowly or  fidgety/restless 0  Suicidal thoughts 0  PHQ-9 Score 7  Difficult doing work/chores Not difficult at all   Subjective:   1. Other fatigue Kelly Taylor admits to daytime somnolence and admits to waking up still tired. Patient has a history of symptoms of daytime fatigue and morning fatigue. Kelly Taylor generally gets 4 or 5 hours of sleep per night, and states that she has nightime awakenings. Snoring is not present. Apneic episodes are not present. Epworth Sleepiness Score is 3.   2. SOBOE (shortness of breath on exertion) Kelly Taylor notes increasing shortness of breath with exercising and seems to be worsening over time with weight gain. She notes getting out of breath sooner with activity than she used to. This has not gotten worse recently. [name] denies shortness of breath at rest or orthopnea.  3. Type 2 diabetes mellitus without complication, without long-term current use of insulin (HCC) Kelly Taylor is on metformin 500 XR once daily.  She consumes excess carbohydrates with little physical activity.  4. Other hyperlipidemia Kelly Taylor is on simvastatin 10 mg once daily.  She denies myalgias or chest pain.  5. Vitamin D deficiency Kelly Taylor is currently not on a vitamin D supplement, and she is unsure of her last DEXA scan.  6. Right axis deviation on EKG Kelly Taylor has no previous diagnosis of COPD, asthma, or obstructive sleep apnea.  Assessment/Plan:   1. Other fatigue Kelly Taylor does feel that her weight is causing her energy to be lower than it should be. Fatigue may be  related to obesity, depression or many other causes. Labs will be ordered, and in the meanwhile, Kelly Taylor will focus on self care including making healthy food choices, increasing physical activity and focusing on stress reduction.  - EKG 12-Lead - TSH - T4, free - T3 - Folate - Comprehensive metabolic panel - Vitamin B12 - CBC with Differential/Platelet  2. SOBOE (shortness of breath on exertion) Kelly Taylor does feel that she gets out of breath more  easily that she used to when she exercises. Kelly Taylor's shortness of breath appears to be obesity related and exercise induced. She has agreed to work on weight loss and gradually increase exercise to treat her exercise induced shortness of breath. Will continue to monitor closely.  - Ambulatory referral to Pulmonology  3. Type 2 diabetes mellitus without complication, without long-term current use of insulin (HCC) We will check labs today.  Kelly Taylor will consider starting Mounjaro.  - Insulin, random - Hemoglobin A1c  4. Other hyperlipidemia We will check labs today.  Kelly Taylor will continue simvastatin 10 mg once daily.  - Lipid panel  5. Vitamin D deficiency We will check labs today, and we will follow-up at Kelly Taylor's next visit.  - VITAMIN D 25 Hydroxy (Vit-D Deficiency, Fractures)  6. Right axis deviation on EKG Referral was submitted to pulmonology for PFTs and sleep study.  7. Depression screening Kelly Taylor had a positive depression screening. Depression is commonly associated with obesity and often results in emotional eating behaviors. We will monitor this closely and work on CBT to help improve the non-hunger eating patterns. Referral to Psychology may be required if no improvement is seen as she continues in our clinic.  8. Obesity, current BMI 48.8 Kelly Taylor is currently in the action stage of change and her goal is to continue with weight loss efforts. I recommend Kelly Taylor begin the structured treatment plan as follows:  She has agreed to the Category 4 Plan.  Exercise goals: All adults should avoid inactivity. Some physical activity is better than none, and adults who participate in any amount of physical activity gain some health benefits.   Behavioral modification strategies: increasing lean protein intake, decreasing simple carbohydrates, increasing vegetables, increasing water intake, decreasing liquid calories, decreasing eating out, no skipping meals, keeping healthy foods in the home,  better snacking choices, and dealing with family or coworker sabotage.  She was informed of the importance of frequent follow-up visits to maximize her success with intensive lifestyle modifications for her multiple health conditions. She was informed we would discuss her lab results at her next visit unless there is a critical issue that needs to be addressed sooner. Kelly Taylor agreed to keep her next visit at the agreed upon time to discuss these results.  Objective:   Blood pressure 125/83, pulse (!) 105, temperature 98 F (36.7 C), height 5\' 7"  (1.702 m), weight (!) 311 lb (141.1 kg), SpO2 93 %. Body mass index is 48.71 kg/m.  EKG: Normal sinus rhythm, rate 103 BPM.  Indirect Calorimeter completed today shows a VO2 of 337 and a REE of 2333.  Her calculated basal metabolic rate is thus her basal metabolic rate is better than expected.  General: Cooperative, alert, well developed, in no acute distress. HEENT: Conjunctivae and lids unremarkable. Cardiovascular: Regular rhythm.  Lungs: Normal work of breathing. Neurologic: No focal deficits.   Lab Results  Component Value Date   CREATININE 0.64 07/18/2022   BUN 15 07/18/2022   NA 140 07/18/2022   K 4.3 07/18/2022   CL 99  07/18/2022   CO2 24 07/18/2022   Lab Results  Component Value Date   ALT 14 07/18/2022   AST 16 07/18/2022   ALKPHOS 108 07/18/2022   BILITOT 0.4 07/18/2022   Lab Results  Component Value Date   HGBA1C 7.3 (H) 07/18/2022   HGBA1C (H) 03/16/2010    6.1 (NOTE)                                                                       According to the ADA Clinical Practice Recommendations for 2011, when HbA1c is used as a screening test:   >=6.5%   Diagnostic of Diabetes Mellitus           (if abnormal result  is confirmed)  5.7-6.4%   Increased risk of developing Diabetes Mellitus  References:Diagnosis and Classification of Diabetes Mellitus,Diabetes Care,2011,34(Suppl 1):S62-S69 and Standards of Medical Care in          Diabetes - 2011,Diabetes Care,2011,34  (Suppl 1):S11-S61.   Lab Results  Component Value Date   INSULIN 10.9 07/18/2022   Lab Results  Component Value Date   TSH 3.860 07/18/2022   Lab Results  Component Value Date   CHOL 177 07/18/2022   HDL 56 07/18/2022   LDLCALC 88 07/18/2022   TRIG 198 (H) 07/18/2022   CHOLHDL 3.2 07/18/2022   Lab Results  Component Value Date   WBC 9.7 07/18/2022   HGB 13.8 07/18/2022   HCT 43.0 07/18/2022   MCV 87 07/18/2022   PLT 271 07/18/2022   No results found for: "IRON", "TIBC", "FERRITIN" Obesity Behavioral Intervention:   Approximately 15 minutes were spent on the discussion below.  ASK: We discussed the diagnosis of obesity with Kelly Taylor today and Yulonda agreed to give [place] permission to discuss obesity behavioral modification therapy today.  ASSESS: Kelly Taylor has the diagnosis of obesity and her BMI today is 48.8. Kelly Taylor is in the action stage of change.   ADVISE: Thena was educated on the multiple health risks of obesity as well as the benefit of weight loss to improve her health. She was advised of the need for long term treatment and the importance of lifestyle modifications to improve her current health and to decrease her risk of future health problems.  AGREE: Multiple dietary modification options and treatment options were discussed and Lakeitha agreed to follow the recommendations documented in the above note.  ARRANGE: Jacoby was educated on the importance of frequent visits to treat obesity as outlined per CMS and USPSTF guidelines and agreed to schedule her next follow up appointment today.  Attestation Statements:   Reviewed by clinician on day of visit: allergies, medications, problem list, medical history, surgical history, family history, social history, and previous encounter notes.   Trude Mcburney, am acting as transcriptionist for Seymour Bars, DO.  I have reviewed the above documentation for accuracy and completeness,  and I agree with the above. Glennis Brink, DO

## 2022-08-02 ENCOUNTER — Encounter (INDEPENDENT_AMBULATORY_CARE_PROVIDER_SITE_OTHER): Payer: Self-pay | Admitting: Family Medicine

## 2022-08-02 ENCOUNTER — Ambulatory Visit (INDEPENDENT_AMBULATORY_CARE_PROVIDER_SITE_OTHER): Payer: Medicare Other | Admitting: Family Medicine

## 2022-08-02 VITALS — BP 121/84 | HR 105 | Temp 98.4°F | Ht 67.0 in | Wt 305.0 lb

## 2022-08-02 DIAGNOSIS — J45909 Unspecified asthma, uncomplicated: Secondary | ICD-10-CM | POA: Diagnosis not present

## 2022-08-02 DIAGNOSIS — E678 Other specified hyperalimentation: Secondary | ICD-10-CM

## 2022-08-02 DIAGNOSIS — Z7984 Long term (current) use of oral hypoglycemic drugs: Secondary | ICD-10-CM | POA: Diagnosis not present

## 2022-08-02 DIAGNOSIS — E7849 Other hyperlipidemia: Secondary | ICD-10-CM | POA: Diagnosis not present

## 2022-08-02 DIAGNOSIS — E559 Vitamin D deficiency, unspecified: Secondary | ICD-10-CM | POA: Diagnosis not present

## 2022-08-02 DIAGNOSIS — E1169 Type 2 diabetes mellitus with other specified complication: Secondary | ICD-10-CM

## 2022-08-02 DIAGNOSIS — Z6841 Body Mass Index (BMI) 40.0 and over, adult: Secondary | ICD-10-CM

## 2022-08-02 DIAGNOSIS — E669 Obesity, unspecified: Secondary | ICD-10-CM

## 2022-08-02 HISTORY — DX: Other specified hyperalimentation: E67.8

## 2022-08-02 MED ORDER — VITAMIN D (ERGOCALCIFEROL) 1.25 MG (50000 UNIT) PO CAPS: 50000.0000 [IU] | ORAL_CAPSULE | ORAL | 0 refills | Status: DC

## 2022-08-07 NOTE — Progress Notes (Unsigned)
Chief Complaint:   OBESITY Kelly Taylor is here to discuss her progress with her obesity treatment plan along with follow-up of her obesity related diagnoses. Kelly Taylor is on the Category 4 Plan and states she is following her eating plan approximately 50% of the time. Kelly Taylor states she is swimming gym, personal trainer 60 minutes 2 times per week.  Today's visit was #: 2 Starting weight: 311 lbs Starting date: 07/18/2022 Today's weight: 305 lbs Today's date: 08/02/2022 Total lbs lost to date: 6 lbs Total lbs lost since last in-office visit: 6 lbs  Interim History: has been through the loss of her mother in law.  Trying to get food in on meal plan.  Going to the gym 2 times per week.  Getting in fruits and veggies daily.  Denies over eating or cravings.   Subjective:   1. Type 2 diabetes mellitus with other specified complication, unspecified whether long term insulin use (Kelly Taylor) Discussed labs with patient today. A1c 7.3, 07/18/2022.  Currently on metformin XR 500 mg daily.  Has had steroid injections in the past 3 months.  Held off on use of GLP-1 agonist due to cost.    2. Other hyperlipidemia On Simvastatin 10 mg daily.  Triglycerides increased t 198, likely from elevated glucose.  3. Vitamin D deficiency Vitamin D level 30.8, not on Vitamin D.  Needs DEXA every 2 years.   4. Excessive vitamin B12 intake B-12 >2000 on a daily B-12 supplement.  Denies feeling poorly.   5. Asthma, unspecified asthma severity, unspecified whether complicated, unspecified whether persistent Using albuterol 2-4 per day as needed.  Has office upcoming with pulmonology for PFT's.    Assessment/Plan:   1. Type 2 diabetes mellitus with other specified complication, unspecified whether long term insulin use (Kelly Taylor) Continue to decrease sugar intake, increase exercise, work on weight loss.   2. Other hyperlipidemia Continue simvastatin 10 mg daily.  3. Vitamin D deficiency Begin - Vitamin D,  Ergocalciferol, (DRISDOL) 1.25 MG (50000 UNIT) CAPS capsule; Take 1 capsule (50,000 Units total) by mouth every 7 (seven) days.  Dispense: 5 capsule; Refill: 0  4. Excessive vitamin B12 intake Stop Vitamin B-12 supplements.  Ok to continue a multivitamin daily.    5. Asthma, unspecified asthma severity, unspecified whether complicated, unspecified whether persistent Keep upcoming pulmonology visit.   6. Obesity,current BMI 47.9 Reviewed some nutrition facts.   Kelly Taylor is currently in the action stage of change. As such, her goal is to continue with weight loss efforts. She has agreed to the Category 4 Plan.   Exercise goals:  as is.   Behavioral modification strategies: increasing lean protein intake.  Kelly Taylor has agreed to follow-up with our clinic in 3-4 weeks. She was informed of the importance of frequent follow-up visits to maximize her success with intensive lifestyle modifications for her multiple health conditions.   Objective:   Blood pressure 121/84, pulse (!) 105, temperature 98.4 F (36.9 C), height 5\' 7"  (1.702 m), weight (!) 305 lb (138.3 kg), SpO2 95 %. Body mass index is 47.77 kg/m.  General: Cooperative, alert, well developed, in no acute distress. HEENT: Conjunctivae and lids unremarkable. Cardiovascular: Regular rhythm.  Lungs: Normal work of breathing. Neurologic: No focal deficits.   Lab Results  Component Value Date   CREATININE 0.64 07/18/2022   BUN 15 07/18/2022   NA 140 07/18/2022   K 4.3 07/18/2022   CL 99 07/18/2022   CO2 24 07/18/2022   Lab Results  Component Value Date  ALT 14 07/18/2022   AST 16 07/18/2022   ALKPHOS 108 07/18/2022   BILITOT 0.4 07/18/2022   Lab Results  Component Value Date   HGBA1C 7.3 (H) 07/18/2022   HGBA1C (H) 03/16/2010    6.1 (NOTE)                                                                       According to the ADA Clinical Practice Recommendations for 2011, when HbA1c is used as a screening test:   >=6.5%    Diagnostic of Diabetes Mellitus           (if abnormal result  is confirmed)  5.7-6.4%   Increased risk of developing Diabetes Mellitus  References:Diagnosis and Classification of Diabetes Mellitus,Diabetes Care,2011,34(Suppl 1):S62-S69 and Standards of Medical Care in         Diabetes - 2011,Diabetes Care,2011,34  (Suppl 1):S11-S61.   Lab Results  Component Value Date   INSULIN 10.9 07/18/2022   Lab Results  Component Value Date   TSH 3.860 07/18/2022   Lab Results  Component Value Date   CHOL 177 07/18/2022   HDL 56 07/18/2022   LDLCALC 88 07/18/2022   TRIG 198 (H) 07/18/2022   CHOLHDL 3.2 07/18/2022   Lab Results  Component Value Date   VD25OH 30.8 07/18/2022   Lab Results  Component Value Date   WBC 9.7 07/18/2022   HGB 13.8 07/18/2022   HCT 43.0 07/18/2022   MCV 87 07/18/2022   PLT 271 07/18/2022   No results found for: "IRON", "TIBC", "FERRITIN"  Obesity Behavioral Intervention:   Approximately 15 minutes were spent on the discussion below.  ASK: We discussed the diagnosis of obesity with Kelly Taylor today and Kelly Taylor agreed to give [place] permission to discuss obesity behavioral modification therapy today.  ASSESS: Kelly Taylor has the diagnosis of obesity and her BMI today is 47.9. Kelly Taylor is in the action stage of change.   ADVISE: Kelly Taylor was educated on the multiple health risks of obesity as well as the benefit of weight loss to improve her health. She was advised of the need for long term treatment and the importance of lifestyle modifications to improve her current health and to decrease her risk of future health problems.  AGREE: Multiple dietary modification options and treatment options were discussed and Kelly Taylor agreed to follow the recommendations documented in the above note.  ARRANGE: Kelly Taylor was educated on the importance of frequent visits to treat obesity as outlined per CMS and USPSTF guidelines and agreed to schedule her next follow up appointment  today.  Attestation Statements:   Reviewed by clinician on day of visit: allergies, medications, problem list, medical history, surgical history, family history, social history, and previous encounter notes.  I, Malcolm Metro, am acting as Energy manager for Seymour Bars, DO.  I have reviewed the above documentation for accuracy and completeness, and I agree with the above. Glennis Brink, DO

## 2022-08-14 DIAGNOSIS — M5126 Other intervertebral disc displacement, lumbar region: Secondary | ICD-10-CM | POA: Diagnosis not present

## 2022-08-14 DIAGNOSIS — Z9109 Other allergy status, other than to drugs and biological substances: Secondary | ICD-10-CM | POA: Diagnosis not present

## 2022-08-14 DIAGNOSIS — E785 Hyperlipidemia, unspecified: Secondary | ICD-10-CM | POA: Diagnosis not present

## 2022-08-14 DIAGNOSIS — E559 Vitamin D deficiency, unspecified: Secondary | ICD-10-CM | POA: Diagnosis not present

## 2022-08-14 DIAGNOSIS — E118 Type 2 diabetes mellitus with unspecified complications: Secondary | ICD-10-CM | POA: Diagnosis not present

## 2022-08-14 DIAGNOSIS — E039 Hypothyroidism, unspecified: Secondary | ICD-10-CM | POA: Diagnosis not present

## 2022-08-14 DIAGNOSIS — K219 Gastro-esophageal reflux disease without esophagitis: Secondary | ICD-10-CM | POA: Diagnosis not present

## 2022-08-18 DIAGNOSIS — Z23 Encounter for immunization: Secondary | ICD-10-CM | POA: Diagnosis not present

## 2022-08-29 ENCOUNTER — Institutional Professional Consult (permissible substitution): Payer: Medicare Other | Admitting: Student

## 2022-08-30 ENCOUNTER — Encounter: Payer: Self-pay | Admitting: Cardiology

## 2022-08-30 ENCOUNTER — Telehealth: Payer: Self-pay | Admitting: *Deleted

## 2022-08-30 ENCOUNTER — Ambulatory Visit: Payer: Medicare Other | Attending: Cardiology | Admitting: Cardiology

## 2022-08-30 VITALS — BP 110/78 | HR 104 | Ht 67.0 in | Wt 310.8 lb

## 2022-08-30 DIAGNOSIS — R072 Precordial pain: Secondary | ICD-10-CM

## 2022-08-30 DIAGNOSIS — K219 Gastro-esophageal reflux disease without esophagitis: Secondary | ICD-10-CM | POA: Diagnosis not present

## 2022-08-30 DIAGNOSIS — E7849 Other hyperlipidemia: Secondary | ICD-10-CM | POA: Diagnosis not present

## 2022-08-30 DIAGNOSIS — R7303 Prediabetes: Secondary | ICD-10-CM | POA: Diagnosis not present

## 2022-08-30 DIAGNOSIS — E785 Hyperlipidemia, unspecified: Secondary | ICD-10-CM | POA: Insufficient documentation

## 2022-08-30 DIAGNOSIS — Z8249 Family history of ischemic heart disease and other diseases of the circulatory system: Secondary | ICD-10-CM | POA: Diagnosis not present

## 2022-08-30 DIAGNOSIS — Z6841 Body Mass Index (BMI) 40.0 and over, adult: Secondary | ICD-10-CM | POA: Insufficient documentation

## 2022-08-30 DIAGNOSIS — R600 Localized edema: Secondary | ICD-10-CM | POA: Insufficient documentation

## 2022-08-30 LAB — BASIC METABOLIC PANEL
BUN/Creatinine Ratio: 15 (ref 12–28)
BUN: 11 mg/dL (ref 8–27)
CO2: 25 mmol/L (ref 20–29)
Calcium: 10 mg/dL (ref 8.7–10.3)
Chloride: 102 mmol/L (ref 96–106)
Creatinine, Ser: 0.73 mg/dL (ref 0.57–1.00)
Glucose: 147 mg/dL — ABNORMAL HIGH (ref 70–99)
Potassium: 4.1 mmol/L (ref 3.5–5.2)
Sodium: 141 mmol/L (ref 134–144)
eGFR: 90 mL/min/{1.73_m2} (ref >59)

## 2022-08-30 MED ORDER — METOPROLOL TARTRATE 100 MG PO TABS: 100.0000 mg | ORAL_TABLET | Freq: Once | ORAL | 0 refills | Status: DC | PRN

## 2022-08-30 NOTE — Telephone Encounter (Signed)
Pt has been set up with [facility] study ordered per Dr. Radford Pax. Pt agreeable to signed waiver and not to open the box until she has been called with the PIN# .

## 2022-08-30 NOTE — Patient Instructions (Signed)
Medication Instructions:  Your physician recommends that you continue on your current medications as directed. Please refer to the Current Medication list given to you today.  *If you need a refill on your cardiac medications before your next appointment, please call your pharmacy*  Lab Work: TODAY: BMET If you have labs (blood work) drawn today and your tests are completely normal, you will receive your results only by: [place] (if you have MyChart) OR A paper copy in the mail If you have any lab test that is abnormal or we need to change your treatment, we will call you to review the results.  Testing/Procedures: Your physician has recommended that you have a sleep study. This test records several body functions during sleep, including: brain activity, eye movement, oxygen and carbon dioxide blood levels, heart rate and rhythm, breathing rate and rhythm, the flow of air through your mouth and nose, snoring, body muscle movements, and chest and belly movement.  Your physician has requested that you have cardiac CT. Cardiac computed tomography (CT) is a painless test that uses an x-ray machine to take clear, detailed pictures of your heart. For further instructions see letter.  Follow-Up: At [facility], you and your health needs are our priority.  As part of our continuing mission to provide you with exceptional heart care, we have created designated Provider Care Teams.  These Care Teams include your primary Cardiologist (physician) and Advanced Practice Providers (APPs -  Physician Assistants and Nurse Practitioners) who all work together to provide you with the care you need, when you need it.  Follow up with Dr. Radford Pax as needed based on results of testing.   Important Information About Sugar

## 2022-08-30 NOTE — Progress Notes (Signed)
Cardiology CONSULT Note    Date:  08/30/2022   ID:  Kelly, Taylor 06-27-1951, MRN [id number]  PCP:  Janie Morning, DO  Cardiologist:  Fransico Him, MD   Chief Complaint  Patient presents with   New Patient (Initial Visit)    Family history of CAD    History of Present Illness:  Kelly Taylor is a 71 y.o. female who is being seen today for the evaluation of cardiac risk factors for CAD at the request of Rivard, Katharine Look, MD.  This is a 71 year old female with a history of arthritis, borderline diabetes mellitus, GERD, morbid obesity, remote pericarditis and family history of CAD who was referred for cardiac assessment due to multiple cardiac risk factors.  The patient tells me that her GYN wants to place her on estrogen replacement for severe hot flashes.    She has a family hx of CAD.  Her mom had an MI at 61 and died of Ca in her late 65's.  She tells me that over the past 2 weeks she has had sharp pain over her left breast that is worse with deep breathing.  She also has noticed some skipped heart beats at time. She has chronic DOE when working out due to obesity. She has chronic LLE edema related to orthopedic problems.  She occasionally notices skipped heart beats. he denies any PND, orthopnea, dizziness or syncope.  She is compliant with her meds and is tolerating meds with no SE.    Past Medical History:  Diagnosis Date   Anemia    hx of in 20's   Arthritis    Asthma    Back pain    Breast mass, left 2003   Chronic back pain    seeing Pain specialist   Complication of anesthesia    "shallow breathing with general"   Constipation    DDD (degenerative disc disease)    Depression    Diabetes mellitus    borderline   DJD (degenerative joint disease)    Family history of heart attack    GERD (gastroesophageal reflux disease)    H/O scarlet fever    Hemorrhoids    History of chicken pox    History of measles, mumps, or rubella as child   all 3   Hx of bladder  infections    Hypothyroidism    Irritable bowel syndrome    Joint pain    Lumbar stenosis    RFA done May 2015   Menopausal symptoms 2003   Obesity, morbid ([place]) 2006   Pericarditis    hx of   Pneumonia    hx of 5 years ago   Polyp of colon    removed during colonoscopy   SOBOE (shortness of breath on exertion)    Spinal headache    x1   Thyroid condition    Trichomonas    Urge incontinence 2005   Vertigo    over a year was last episode   Yeast in stool     Past Surgical History:  Procedure Laterality Date   ABDOMINAL HYSTERECTOMY  02/1990   APPENDECTOMY  age 19   BLADDER SUSPENSION     BREAST LUMPECTOMY Left 2003   [place]  2010   COLONOSCOPY  may 2015   4 polyps-benign   DILATION AND CURETTAGE OF UTERUS     FOOT SURGERY Right    pinched nerve   HAND SURGERY Right  right-Dr. Amanda Pea   HERNIA REPAIR Right 1992   INTERSTIM IMPLANT PLACEMENT Right 2003   for incontinence   KNEE SURGERY     bilateral knee; x3 right/ x2 left   REVERSE SHOULDER ARTHROPLASTY Left 04/19/2017   Procedure: REVERSE SHOULDER ARTHROPLASTY;  Surgeon: Hasting Broom, MD;  Location: [facility];  Service: Orthopedics;  Laterality: Left;  REVERSE SHOULDER ARTHROPLASTY   ROTATOR CUFF REPAIR  2008   right, almost complete reconstruction, bone anchors   TONSILLECTOMY  age 4   TOTAL HIP ARTHROPLASTY Right 07/30/2014   Procedure: RIGHT TOTAL HIP ARTHROPLASTY ANTERIOR APPROACH;  Surgeon: Kathryne Hitch, MD;  Location: [facility];  Service: Orthopedics;  Laterality: Right;   TOTAL KNEE ARTHROPLASTY Right 04/07/2013   Procedure: RIGHT TOTAL KNEE ARTHROPLASTY;  Surgeon: Loanne Drilling, MD;  Location: [facility];  Service: Orthopedics;  Laterality: Right;   TOTAL KNEE ARTHROPLASTY Left 09/22/2013   Procedure: LEFT TOTAL KNEE ARTHROPLASTY;  Surgeon: Loanne Drilling, MD;  Location: [facility];  Service: Orthopedics;  Laterality: Left;    Current Medications: Current Meds   Medication Sig   albuterol (PROVENTIL HFA;VENTOLIN HFA) 108 (90 BASE) MCG/ACT inhaler Inhale 2 puffs into the lungs every 4 (four) hours as needed for wheezing or shortness of breath.   calcium carbonate (OS-CAL) 600 MG TABS tablet Take 1,200 mg by mouth daily with breakfast.    celecoxib (CELEBREX) 200 MG capsule    Cyanocobalamin (VITAMIN B-12) 5000 MCG SUBL Place 5,000 mcg under the tongue daily.   cyclobenzaprine (FLEXERIL) 10 MG tablet Take 10 mg by mouth 3 (three) times daily.   diclofenac sodium (VOLTAREN) 1 % GEL diclofenac 1 % topical gel   docusate sodium (COLACE) 100 MG capsule Take 1 capsule (100 mg total) by mouth 3 (three) times daily as needed.   fluticasone (FLONASE) 50 MCG/ACT nasal spray fluticasone 50 mcg/actuation nasal spray,suspension   furosemide (LASIX) 40 MG tablet Take 40 mg by mouth daily.   gabapentin (NEURONTIN) 100 MG capsule Take 100 mg by mouth 2 (two) times daily as needed (pain).   levothyroxine (SYNTHROID) 75 MCG tablet Take 75 mcg by mouth daily before breakfast.   meclizine (ANTIVERT) 25 MG tablet Take 25 mg by mouth 3 (three) times daily as needed for dizziness.   metFORMIN (GLUCOPHAGE-XR) 500 MG 24 hr tablet TAKE 1 TABLET BY MOUTH WITH EVENING MEAL EVERY DAY   methocarbamol (ROBAXIN) 500 MG tablet Take 1 tablet (500 mg total) by mouth 2 (two) times daily.   Multiple Vitamins-Minerals (MULTIVITAMIN WITH MINERALS) tablet Take 1 tablet by mouth daily.   omeprazole (PRILOSEC) 20 MG capsule Take 20 mg by mouth daily.   polyethylene glycol (MIRALAX / GLYCOLAX) packet Take 17 g by mouth daily as needed for mild constipation (for constipation).    Probiotic Product (PROBIOTIC DAILY PO) Take 1 capsule by mouth daily.   simvastatin (ZOCOR) 10 MG tablet TAKE 1 TABLET BY MOUTH IN THE EVENING EVERY DAY   tiZANidine (ZANAFLEX) 2 MG tablet tizanidine 2 mg tablet   traMADol (ULTRAM) 50 MG tablet Take 100 mg by mouth every 6 (six) hours as needed for moderate pain.    Vitamin D, Ergocalciferol, (DRISDOL) 1.25 MG (50000 UNIT) CAPS capsule Take 1 capsule (50,000 Units total) by mouth every 7 (seven) days.    Allergies:   Oxycodone hcl, Penicillins, Lyrica [pregabalin], Relafen [nabumetone], and Sulfa antibiotics   Social History   Socioeconomic History   Marital status: Widowed    Spouse name: Not on  file   Number of children: Not on file   Years of education: Not on file   Highest education level: Not on file  Occupational History   Occupation: retired  Tobacco Use   Smoking status: Never   Smokeless tobacco: Never  Vaping Use   Vaping Use: Never used  Substance and Sexual Activity   Alcohol use: Yes    Comment: 1 or 2 glasses a week of wine   Drug use: No   Sexual activity: Yes    Birth control/protection: Surgical    Comment: hyst  Other Topics Concern   Not on file  Social History Narrative   Not on file   Social Determinants of Health   Financial Resource Strain: Not on file  Food Insecurity: Not on file  Transportation Needs: Not on file  Physical Activity: Not on file  Stress: Not on file  Social Connections: Not on file     Family History:  The patient's family history includes Breast cancer in her maternal aunt; Cancer in her mother; Depression in her father; Diabetes in her mother; Heart disease in her mother; Hypertension in her mother; Lung cancer in her mother; Obesity in her mother; Ovarian cancer in her maternal aunt; Stroke in her paternal grandfather.   ROS:   Please see the history of present illness.    ROS All other systems reviewed and are negative.      No data to display             PHYSICAL EXAM:   VS:  BP 110/78   Pulse (!) 104   Ht 5\' 7"  (1.702 m)   Wt (!) 310 lb 12.8 oz (141 kg)   SpO2 94%   BMI 48.68 kg/m    GEN: Well nourished, well developed, in no acute distress  HEENT: normal  Neck: no JVD, carotid bruits, or masses Cardiac: RRR; no murmurs, rubs, or gallops,no edema.  Intact distal  pulses bilaterally.  Respiratory:  clear to auscultation bilaterally, normal work of breathing GI: soft, nontender, nondistended, + BS MS: no deformity or atrophy  Skin: warm and dry, no rash Neuro:  Alert and Oriented x 3, Strength and sensation are intact Psych: euthymic mood, full affect  Wt Readings from Last 3 Encounters:  08/30/22 (!) 310 lb 12.8 oz (141 kg)  08/02/22 (!) 305 lb (138.3 kg)  07/18/22 (!) 311 lb (141.1 kg)      Studies/Labs Reviewed:   EKG:  EKG is ordered today.  The ekg ordered today demonstrates sinus tachycardia at 101bpm.   Recent Labs: 07/18/2022: ALT 14; BUN 15; Creatinine, Ser 0.64; Hemoglobin 13.8; Platelets 271; Potassium 4.3; Sodium 140; TSH 3.860   Lipid Panel    Component Value Date/Time   CHOL 177 07/18/2022 1232   TRIG 198 (H) 07/18/2022 1232   HDL 56 07/18/2022 1232   CHOLHDL 3.2 07/18/2022 1232   CHOLHDL 3.6 03/18/2010 0440   VLDL 29 03/18/2010 0440   LDLCALC 88 07/18/2022 1232     CHA2DS2-VASc Score =     This indicates a  % annual risk of stroke. The patient's score is based upon:             Additional studies/ records that were reviewed today include:  Office notes from PCP    ASSESSMENT:    1. Family history of early CAD   2. Other hyperlipidemia   3. Morbid obesity (HCC)      PLAN:  In order of problems listed  above:  Assessment for cardiac risk factors -She has multiple cardiac risk factors including a family history of CAD, prediabetes mellitus and morbid obesity as well as postmenopausal state -She has been having some atypical chest pain that mainly occurs as a sharp pain at rest that is sporadic and also had DOE likely related to morbid obesity -Recommend coronary CTA to define coronary anatomy  2.  Hyperlipidemia -Currently her LDL goal is less than 100 but if she is found to have coronary artery calcifications her LDL goal would be less than 70 -Her last lipid panel in August 2023 showed LDL of 88 HDL  56 -Continue prescription drug management with simvastatin 10 mg daily with as needed refills  3.  Morbid Obesity -recommend watchpat home sleep study  Stop Bang score is 3  Time Spent: 15 minutes total time of encounter, including 10 minutes spent in face-to-face patient care on the date of this encounter. This time includes coordination of care and counseling regarding above mentioned problem list. Remainder of non-face-to-face time involved reviewing chart documents/testing relevant to the patient encounter and documentation in the medical record. I have independently reviewed documentation from referring provider  Medication Adjustments/Labs and Tests Ordered: Current medicines are reviewed at length with the patient today.  Concerns regarding medicines are outlined above.  Medication changes, Labs and Tests ordered today are listed in the Patient Instructions below.  There are no Patient Instructions on file for this visit.   Signed, Armanda Magic, MD  08/30/2022 11:33 AM    [facility] [address] [place], [place], [place]  [address] Phone: [phone]; Fax: [phone]

## 2022-08-30 NOTE — Addendum Note (Signed)
Addended by: Antonieta Iba on: 08/30/2022 11:41 AM   Modules accepted: Orders

## 2022-08-31 ENCOUNTER — Encounter (INDEPENDENT_AMBULATORY_CARE_PROVIDER_SITE_OTHER): Payer: Self-pay | Admitting: Family Medicine

## 2022-08-31 ENCOUNTER — Ambulatory Visit (INDEPENDENT_AMBULATORY_CARE_PROVIDER_SITE_OTHER): Payer: Medicare Other | Admitting: Family Medicine

## 2022-08-31 VITALS — BP 114/77 | HR 80 | Temp 98.2°F | Ht 67.0 in | Wt 301.0 lb

## 2022-08-31 DIAGNOSIS — E669 Obesity, unspecified: Secondary | ICD-10-CM

## 2022-08-31 DIAGNOSIS — Z7984 Long term (current) use of oral hypoglycemic drugs: Secondary | ICD-10-CM

## 2022-08-31 DIAGNOSIS — Z6841 Body Mass Index (BMI) 40.0 and over, adult: Secondary | ICD-10-CM

## 2022-08-31 DIAGNOSIS — J45909 Unspecified asthma, uncomplicated: Secondary | ICD-10-CM

## 2022-08-31 DIAGNOSIS — M48061 Spinal stenosis, lumbar region without neurogenic claudication: Secondary | ICD-10-CM | POA: Diagnosis not present

## 2022-08-31 DIAGNOSIS — E1169 Type 2 diabetes mellitus with other specified complication: Secondary | ICD-10-CM

## 2022-08-31 DIAGNOSIS — E559 Vitamin D deficiency, unspecified: Secondary | ICD-10-CM

## 2022-08-31 MED ORDER — VITAMIN D (ERGOCALCIFEROL) 1.25 MG (50000 UNIT) PO CAPS: 50000.0000 [IU] | ORAL_CAPSULE | ORAL | 0 refills | Status: DC

## 2022-08-31 NOTE — Telephone Encounter (Signed)
Pt calling back for insurance update

## 2022-08-31 NOTE — Telephone Encounter (Signed)
I s/w the pt and she was asking if I had heard back if she was approved for the sleep study. I stated that I had not heard back yet, however, just as soon as I know I will call her with the PIN#. Pt thanked me for the call back.

## 2022-08-31 NOTE — Telephone Encounter (Signed)
Prior Authorization for [facility] sent to MEDICARE via Fax ,NO PA REQ

## 2022-08-31 NOTE — Telephone Encounter (Signed)
I s/w the pt and she has been given the PIN# [id number] and ok to proceed. Pt states she will do the study one night this week.   Called and made the patient aware that she may proceed with the [facility] Sleep Study. PIN # provided to the patient. Patient made aware that she will be contacted after the test has been read with the results and any recommendations. Patient verbalized understanding and thanked me for the call.

## 2022-09-01 ENCOUNTER — Other Ambulatory Visit: Payer: Self-pay | Admitting: Cardiology

## 2022-09-04 ENCOUNTER — Other Ambulatory Visit: Payer: Self-pay

## 2022-09-04 MED ORDER — METOPROLOL TARTRATE 100 MG PO TABS: 100.0000 mg | ORAL_TABLET | Freq: Once | ORAL | 0 refills | Status: DC | PRN

## 2022-09-04 NOTE — Telephone Encounter (Signed)
Pt's medication was sent to pt's pharmacy as requested. Confirmation received.  °

## 2022-09-06 DIAGNOSIS — M47816 Spondylosis without myelopathy or radiculopathy, lumbar region: Secondary | ICD-10-CM | POA: Diagnosis not present

## 2022-09-07 ENCOUNTER — Telehealth: Payer: Self-pay | Admitting: Cardiology

## 2022-09-07 ENCOUNTER — Ambulatory Visit (INDEPENDENT_AMBULATORY_CARE_PROVIDER_SITE_OTHER): Payer: Medicare Other | Admitting: Pulmonary Disease

## 2022-09-07 ENCOUNTER — Encounter: Payer: Self-pay | Admitting: Pulmonary Disease

## 2022-09-07 VITALS — BP 118/74 | HR 100 | Temp 98.0°F | Ht 67.0 in | Wt 309.6 lb

## 2022-09-07 DIAGNOSIS — R0609 Other forms of dyspnea: Secondary | ICD-10-CM

## 2022-09-07 NOTE — Telephone Encounter (Signed)
Patient states she received her sleep study watch, but didn't come with a battery. Please advise

## 2022-09-07 NOTE — Telephone Encounter (Signed)
Patient stated she did not get a battery with her Itmar device. Recommended she get a triple A battery for the device.

## 2022-09-07 NOTE — Progress Notes (Signed)
Chief Complaint:   OBESITY Kelly Taylor is here to discuss her progress with her obesity treatment plan along with follow-up of her obesity related diagnoses. Kelly Taylor is on the Category 4 Plan and states she is following her eating plan approximately 75% of the time. Kelly Taylor states she is working with a Systems analyst for 30-60 minutes 2 times per week.  Today's visit was #: 3 Starting weight: 311 lbs Starting date: 07/18/2022 Today's weight: 301 lbs Today's date: 08/31/22 Total lbs lost to date: 10 Total lbs lost since last in-office visit: -4  Interim History: Scheduled for PFTs October 19 and a home sleep study.  Her back pain has been worse.  Having back ablation next week.  Doing gentle exercises with personal trainer 2 times per week.  Denies hunger or cravings.  Going to a [employer].  Subjective:   1. Vitamin D deficiency On prescription vitamin D 50,000 IU weekly.  2. Type 2 diabetes mellitus with other specified complication, unspecified whether long term insulin use (HCC) Last A1c 7.3 on 07/18/2022. On metformin XR 1000 mg daily, decreased to 500 mg twice daily. GLP-1 agonist too expensive.  3. Asthma, unspecified asthma severity, unspecified whether complicated, unspecified whether persistent Scheduled for PFTs with pulmonology next week. Has only needed albuterol HFA PRN.  4. Spinal stenosis of lumbar region, unspecified whether neurogenic claudication present Has a brace. Back pain limits exercise. Scheduled for back ablation with Dr. Retia Passe.  Assessment/Plan:   1. Vitamin D deficiency Refilled: - Vitamin D, Ergocalciferol, (DRISDOL) 1.25 MG (50000 UNIT) CAPS capsule; Take 1 capsule (50,000 Units total) by mouth every 7 (seven) days.  Dispense: 5 capsule; Refill: 0 Plan to recheck in 2 months.  2. Type 2 diabetes mellitus with other specified complication, unspecified whether long term insulin use (HCC) Continue metformin as directed.  3. Asthma,  unspecified asthma severity, unspecified whether complicated, unspecified whether persistent Complete PFTs as scheduled.  4. Spinal stenosis of lumbar region, unspecified whether neurogenic claudication present Look for improvements post ablation, plans to ramp up workouts.  5. Obesity,current BMI 47.2 1.  Has healthy recipes to make for upcoming party.  Kelly Taylor is currently in the action stage of change. As such, her goal is to continue with weight loss efforts. She has agreed to the Category 4 Plan.   Exercise goals: All adults should avoid inactivity. Some physical activity is better than none, and adults who participate in any amount of physical activity gain some health benefits.  Behavioral modification strategies: increasing lean protein intake, increasing vegetables, increasing water intake, decreasing eating out, no skipping meals, meal planning and cooking strategies, keeping healthy foods in the home, planning for success, and decreasing junk food.  Kelly Taylor has agreed to follow-up with our clinic in 4 weeks. She was informed of the importance of frequent follow-up visits to maximize her success with intensive lifestyle modifications for her multiple health conditions.   Objective:   Blood pressure 114/77, pulse 80, temperature 98.2 F (36.8 C), height 5\' 7"  (1.702 m), weight (!) 301 lb (136.5 kg), SpO2 91 %. Body mass index is 47.14 kg/m.  General: Cooperative, alert, well developed, in no acute distress. HEENT: Conjunctivae and lids unremarkable. Cardiovascular: Regular rhythm.  Lungs: Normal work of breathing. Neurologic: No focal deficits.   Lab Results  Component Value Date   CREATININE 0.73 08/30/2022   BUN 11 08/30/2022   NA 141 08/30/2022   K 4.1 08/30/2022   CL 102 08/30/2022   CO2 25  08/30/2022   Lab Results  Component Value Date   ALT 14 07/18/2022   AST 16 07/18/2022   ALKPHOS 108 07/18/2022   BILITOT 0.4 07/18/2022   Lab Results  Component Value Date    HGBA1C 7.3 (H) 07/18/2022   HGBA1C (H) 03/16/2010    6.1 (NOTE)                                                                       According to the ADA Clinical Practice Recommendations for 2011, when HbA1c is used as a screening test:   >=6.5%   Diagnostic of Diabetes Mellitus           (if abnormal result  is confirmed)  5.7-6.4%   Increased risk of developing Diabetes Mellitus  References:Diagnosis and Classification of Diabetes Mellitus,Diabetes PNTI,1443,15(QMGQQ 1):S62-S69 and Standards of Medical Care in         Diabetes - 2011,Diabetes Care,2011,34  (Suppl 1):S11-S61.   Lab Results  Component Value Date   INSULIN 10.9 07/18/2022   Lab Results  Component Value Date   TSH 3.860 07/18/2022   Lab Results  Component Value Date   CHOL 177 07/18/2022   HDL 56 07/18/2022   LDLCALC 88 07/18/2022   TRIG 198 (H) 07/18/2022   CHOLHDL 3.2 07/18/2022   Lab Results  Component Value Date   VD25OH 30.8 07/18/2022   Lab Results  Component Value Date   WBC 9.7 07/18/2022   HGB 13.8 07/18/2022   HCT 43.0 07/18/2022   MCV 87 07/18/2022   PLT 271 07/18/2022   No results found for: "IRON", "TIBC", "FERRITIN"   Attestation Statements:   Reviewed by clinician on day of visit: allergies, medications, problem list, medical history, surgical history, family history, social history, and previous encounter notes.  I, Georgianne Fick, FNP, am acting as transcriptionist for Dr. Loyal Gambler.  I have reviewed the above documentation for accuracy and completeness, and I agree with the above. Dell Ponto, DO

## 2022-09-07 NOTE — Patient Instructions (Signed)
Continue albuterol as needed  Schedule for pulmonary function test  Follow-up in 3 months  We will be on the look out for your sleep study  Continue weight loss efforts  Call us with significant concerns  If you are still wheezing despite use of albuterol, we may need to add another inhaler

## 2022-09-07 NOTE — Progress Notes (Signed)
Kelly Taylor    GQ:[id number]    Dec 16, 1950  Primary Care Physician:Collins, Hinton Dyer, DO  Referring Physician: Janie Morning, [place] [address] [place],  [place] [address]  Chief complaint:   Patient being seen for shortness of breath, wheezing  HPI:  Shortness of breath no wheezing  Was recently evaluated by cardiology Has a sleep study pending  Shortness of breath has been progressively worse over the past many months especially following back surgery She got more limited with decreasing activity levels and weight gain  She has managed to be on a track to losing some weight in the last few weeks  Stated she does not snore but does have heavy breathing at night Wakes up not feeling rested  No underlying lung disease known to her No history of smoking Was exposed to secondhand smoke  She is retired  She does use albuterol occasionally for shortness of breath  Outpatient Encounter Medications as of 09/07/2022  Medication Sig   albuterol (PROVENTIL HFA;VENTOLIN HFA) 108 (90 BASE) MCG/ACT inhaler Inhale 2 puffs into the lungs every 4 (four) hours as needed for wheezing or shortness of breath.   calcium carbonate (OS-CAL) 600 MG TABS tablet Take 1,200 mg by mouth daily with breakfast.    celecoxib (CELEBREX) 200 MG capsule    Cyanocobalamin (VITAMIN B-12) 5000 MCG SUBL Place 5,000 mcg under the tongue daily. (Patient not taking: Reported on 08/31/2022)   cyclobenzaprine (FLEXERIL) 10 MG tablet Take 10 mg by mouth 3 (three) times daily.   diclofenac sodium (VOLTAREN) 1 % GEL diclofenac 1 % topical gel   docusate sodium (COLACE) 100 MG capsule Take 1 capsule (100 mg total) by mouth 3 (three) times daily as needed. (Patient not taking: Reported on 08/31/2022)   fluticasone (FLONASE) 50 MCG/ACT nasal spray fluticasone 50 mcg/actuation nasal spray,suspension   furosemide (LASIX) 40 MG tablet Take 40 mg by mouth daily.   gabapentin (NEURONTIN) 100 MG capsule  Take 100 mg by mouth 2 (two) times daily as needed (pain).   levothyroxine (SYNTHROID) 75 MCG tablet Take 75 mcg by mouth daily before breakfast.   meclizine (ANTIVERT) 25 MG tablet Take 25 mg by mouth 3 (three) times daily as needed for dizziness.   metFORMIN (GLUCOPHAGE-XR) 500 MG 24 hr tablet TAKE 1 TABLET BY MOUTH WITH EVENING MEAL EVERY DAY   methocarbamol (ROBAXIN) 500 MG tablet Take 1 tablet (500 mg total) by mouth 2 (two) times daily. (Patient not taking: Reported on 08/31/2022)   metoprolol tartrate (LOPRESSOR) 100 MG tablet Take 1 tablet (100 mg total) by mouth Once PRN for up to 1 dose (Take 1 tablet (100 mg) two hours prior to CT scan).   Multiple Vitamins-Minerals (MULTIVITAMIN WITH MINERALS) tablet Take 1 tablet by mouth daily.   omeprazole (PRILOSEC) 20 MG capsule Take 20 mg by mouth daily.   polyethylene glycol (MIRALAX / GLYCOLAX) packet Take 17 g by mouth daily as needed for mild constipation (for constipation).    Probiotic Product (PROBIOTIC DAILY PO) Take 1 capsule by mouth daily.   simvastatin (ZOCOR) 10 MG tablet TAKE 1 TABLET BY MOUTH IN THE EVENING EVERY DAY   tiZANidine (ZANAFLEX) 2 MG tablet tizanidine 2 mg tablet   traMADol (ULTRAM) 50 MG tablet Take 100 mg by mouth every 6 (six) hours as needed for moderate pain.   Vitamin D, Ergocalciferol, (DRISDOL) 1.25 MG (50000 UNIT) CAPS capsule Take 1 capsule (50,000 Units total) by mouth every  7 (seven) days.   No facility-administered encounter medications on file as of 09/07/2022.    Allergies as of 09/07/2022 - Review Complete 09/07/2022  Allergen Reaction Noted   Oxycodone hcl Shortness Of Breath and Other (See Comments) 09/16/2013   Penicillins Anaphylaxis and Hives 12/22/2011   Lyrica [pregabalin]  10/27/2019   Relafen [nabumetone] Nausea And Vomiting 09/16/2013   Sulfa antibiotics Other (See Comments) 12/22/2011    Past Medical History:  Diagnosis Date   Anemia    hx of in 20's   Arthritis    Asthma    Back  pain    Breast mass, left 2003   Chronic back pain    seeing Pain specialist   Complication of anesthesia    "shallow breathing with general"   Constipation    DDD (degenerative disc disease)    Depression    Diabetes mellitus    borderline   DJD (degenerative joint disease)    Family history of heart attack    GERD (gastroesophageal reflux disease)    H/O scarlet fever    Hemorrhoids    History of chicken pox    History of measles, mumps, or rubella as child   all 3   Hx of bladder infections    Hypothyroidism    Irritable bowel syndrome    Joint pain    Lumbar stenosis    RFA done May 2015   Menopausal symptoms 2003   Obesity, morbid ([place]) 2006   Pericarditis    hx of   Pneumonia    hx of 5 years ago   Polyp of colon    removed during colonoscopy   SOBOE (shortness of breath on exertion)    Spinal headache    x1   Thyroid condition    Trichomonas    Urge incontinence 2005   Vertigo    over a year was last episode   Yeast in stool     Past Surgical History:  Procedure Laterality Date   ABDOMINAL HYSTERECTOMY  02/1990   APPENDECTOMY  age 31   BLADDER SUSPENSION     BREAST LUMPECTOMY Left 2003   [place]  2010   COLONOSCOPY  may 2015   4 polyps-benign   DILATION AND CURETTAGE OF UTERUS     FOOT SURGERY Right    pinched nerve   HAND SURGERY Right    right-Dr. [place] Right 1992   INTERSTIM IMPLANT PLACEMENT Right 2003   for incontinence   KNEE SURGERY     bilateral knee; x3 right/ x2 left   REVERSE SHOULDER ARTHROPLASTY Left 04/19/2017   Procedure: REVERSE SHOULDER ARTHROPLASTY;  Surgeon: Tania Ade, MD;  Location: [facility];  Service: Orthopedics;  Laterality: Left;  REVERSE SHOULDER ARTHROPLASTY   ROTATOR CUFF REPAIR  2008   right, almost complete reconstruction, bone anchors   TONSILLECTOMY  age 11   TOTAL HIP ARTHROPLASTY Right 07/30/2014   Procedure: RIGHT TOTAL HIP ARTHROPLASTY ANTERIOR  APPROACH;  Surgeon: Mcarthur Rossetti, MD;  Location: [facility];  Service: Orthopedics;  Laterality: Right;   TOTAL KNEE ARTHROPLASTY Right 04/07/2013   Procedure: RIGHT TOTAL KNEE ARTHROPLASTY;  Surgeon: Gearlean Alf, MD;  Location: [facility];  Service: Orthopedics;  Laterality: Right;   TOTAL KNEE ARTHROPLASTY Left 09/22/2013   Procedure: LEFT TOTAL KNEE ARTHROPLASTY;  Surgeon: Gearlean Alf, MD;  Location: [facility];  Service: Orthopedics;  Laterality: Left;    Family History  Problem  Relation Age of Onset   Cancer Mother    Hypertension Mother    Diabetes Mother    Lung cancer Mother    Heart disease Mother    Obesity Mother    Depression Father    Stroke Paternal Grandfather    Ovarian cancer Maternal Aunt    Breast cancer Maternal Aunt     Social History   Socioeconomic History   Marital status: Widowed    Spouse name: Not on file   Number of children: Not on file   Years of education: Not on file   Highest education level: Not on file  Occupational History   Occupation: retired  Tobacco Use   Smoking status: Never   Smokeless tobacco: Never  Vaping Use   Vaping Use: Never used  Substance and Sexual Activity   Alcohol use: Yes    Comment: 1 or 2 glasses a week of wine   Drug use: No   Sexual activity: Yes    Birth control/protection: Surgical    Comment: hyst  Other Topics Concern   Not on file  Social History Narrative   Not on file   Social Determinants of Health   Financial Resource Strain: Not on file  Food Insecurity: Not on file  Transportation Needs: Not on file  Physical Activity: Not on file  Stress: Not on file  Social Connections: Not on file  Intimate Partner Violence: Not on file    Review of Systems  Respiratory:  Positive for shortness of breath.   Musculoskeletal:  Positive for arthralgias and back pain.    There were no vitals filed for this visit.   Physical Exam Constitutional:      Appearance: She is obese.  HENT:      Head: Normocephalic.     Mouth/Throat:     Mouth: Mucous membranes are moist.     Comments: Mallampati 3, crowded oropharynx Cardiovascular:     Rate and Rhythm: Normal rate and regular rhythm.     Heart sounds: No murmur heard.    No friction rub.  Pulmonary:     Effort: No respiratory distress.     Breath sounds: No stridor. No wheezing or rhonchi.  Musculoskeletal:     Cervical back: No rigidity or tenderness.  Neurological:     Mental Status: She is alert.  Psychiatric:        Mood and Affect: Mood normal.    Data Reviewed:   Assessment:  Shortness of breath on exertion  Class III obesity  Chronic back pain  Prediabetes  Possibility of significant obstructive sleep apnea was discussed with the patient  Plan/Recommendations:  Follow-up on sleep study  Encourage weight loss efforts  Schedule for pulmonary function test  Inhaler technique was reviewed and taught  Encouraged to continue using albuterol as needed for wheezing or shortness of breath  Follow-up in 3 months     Sherrilyn Rist MD [facility] 09/07/2022, 2:56 PM  CC: Janie Morning, DO

## 2022-09-08 NOTE — Telephone Encounter (Signed)
I s/w the pt and apologized that she did not have a battery in the box for the sleep study device. Pt said that was ok, she has a AAA that she can put in the device. Pt aware once results are in we will call with any recommendations and results. Pt states she will do the sleep study between tonight and early next week. Pt has PIN# [id number].   Called and made the patient aware that she may proceed with the [facility] Sleep Study. PIN # provided to the patient. Patient made aware that she will be contacted after the test has been read with the results and any recommendations. Patient verbalized understanding and thanked me for the call.

## 2022-09-12 ENCOUNTER — Telehealth (HOSPITAL_COMMUNITY): Payer: Self-pay | Admitting: Emergency Medicine

## 2022-09-12 NOTE — Telephone Encounter (Signed)
Reaching out to patient to offer assistance regarding upcoming cardiac imaging study; pt verbalizes understanding of appt date/time, parking situation and where to check in, pre-test NPO status and medications ordered, and verified current allergies; name and call back number provided for further questions should they arise Marchia Bond RN Navigator Cardiac Imaging [facility] [phone] office [phone] cell   Arrival [address] Interstim (will bring remote to turn off) 100mg  metoprolol

## 2022-09-13 ENCOUNTER — Ambulatory Visit (HOSPITAL_COMMUNITY)
Admission: RE | Admit: 2022-09-13 | Discharge: 2022-09-13 | Disposition: A | Discharge status: Home / Self Care | Payer: Medicare Other | Source: Ambulatory Visit | Attending: Cardiology | Admitting: Cardiology

## 2022-09-13 DIAGNOSIS — R072 Precordial pain: Secondary | ICD-10-CM | POA: Insufficient documentation

## 2022-09-13 MED ORDER — METOPROLOL TARTRATE 5 MG/5ML IV SOLN
10.0000 mg | Freq: Once | INTRAVENOUS | Status: AC
  Administered 2022-09-13: 10 mg via INTRAVENOUS

## 2022-09-13 MED ORDER — IOHEXOL 350 MG/ML SOLN
125.0000 mL | Freq: Once | INTRAVENOUS | Status: AC | PRN
  Administered 2022-09-13: 125 mL via INTRAVENOUS

## 2022-09-13 MED ORDER — METOPROLOL TARTRATE 5 MG/5ML IV SOLN
INTRAVENOUS | Status: AC
  Filled 2022-09-13: qty 10

## 2022-09-13 MED ORDER — NITROGLYCERIN 0.4 MG SL SUBL
SUBLINGUAL_TABLET | SUBLINGUAL | Status: AC
  Filled 2022-09-13: qty 2

## 2022-09-13 MED ORDER — NITROGLYCERIN 0.4 MG SL SUBL
0.8000 mg | SUBLINGUAL_TABLET | Freq: Once | SUBLINGUAL | Status: AC
  Administered 2022-09-13: 0.8 mg via SUBLINGUAL

## 2022-09-14 ENCOUNTER — Encounter: Payer: Self-pay | Admitting: Cardiology

## 2022-09-14 DIAGNOSIS — I77810 Thoracic aortic ectasia: Secondary | ICD-10-CM | POA: Insufficient documentation

## 2022-09-15 NOTE — Telephone Encounter (Signed)
I left a message for the pt in regard to her sleep study. See previous notes. Left message if the pt may be able to do sleep study this weekend so that we may keep an eye out for the results to come in. If not possible please call the office .

## 2022-09-18 ENCOUNTER — Encounter (HOSPITAL_BASED_OUTPATIENT_CLINIC_OR_DEPARTMENT_OTHER): Payer: Medicare Other | Admitting: Cardiology

## 2022-09-18 DIAGNOSIS — G4733 Obstructive sleep apnea (adult) (pediatric): Secondary | ICD-10-CM | POA: Diagnosis not present

## 2022-09-19 ENCOUNTER — Telehealth: Payer: Self-pay | Admitting: Cardiology

## 2022-09-19 NOTE — Telephone Encounter (Signed)
Calling in regards to the sleep monitor she used. Please advise

## 2022-09-19 NOTE — Telephone Encounter (Signed)
S/w the pt and she was telling me that the device fell off during the test and wanted to be sure that we had enough information. I assure the pt while speaking with her that I can see total sleep time 8 hours and 4 minutes. Pt asked about what did the results show. I stated to her that she will need to s/w Kelly Taylor, Kelly Taylor. I assured the pt that I will send Kelly Taylor a message to call her with the results. Pt thanked me for the call back and the help.

## 2022-09-19 NOTE — Procedures (Signed)
SLEEP STUDY REPORT Patient Information Study Date: 09/18/22 Patient Name: Kelly Taylor Patient ID: [id number] Birth Date: 2050/12/26 Age: 72 Gender: Female BMI: 48.8 (W=311 lb, H=5' 7'') Stopbang: 3 Referring Physician: Gwyndolyn Kaufman, MD  TEST DESCRIPTION: Home sleep apnea testing was completed using the WatchPat, a Type 1 device, utilizing peripheral arterial tonometry (PAT), chest movement, actigraphy, pulse oximetry, pulse rate, body position and snore.  AHI was calculated with apnea and hypopnea using valid sleep time as the denominator. RDI includes apneas, hypopneas, and RERAs.  The data acquired and the scoring of sleep and all associated events were performed in accordance with the recommended standards and specifications as outlined in the AASM Manual for the Scoring of Sleep and Associated Events 2.2.0 (2015).  FINDINGS:  1.  Severe Obstructive Sleep Apnea with AHI 44.4/hr.   2.  No Central Sleep Apnea with pAHIc 2.6/hr.  3.  Oxygen desaturations as low as 51%.  4.  Severe snoring was present. O2 sats were < 88% for 460.2 min.  5.  Total sleep time was 7 hrs and 45 min.  6.  23.7% of total sleep time was spent in REM sleep.   7.  Shortened sleep onset latency at 5 min.   8.  Shortened REM sleep onset latency at 49 min.   9.  Total awakenings were 8.  10. Arrhythmia detection:  None.  DIAGNOSIS:   Severe Obstructive Sleep Apnea (G47.33) Nocturnal Hypoxemia  RECOMMENDATIONS:   1.  Clinical correlation of these findings is necessary.  The decision to treat obstructive sleep apnea (OSA) is usually based on the presence of apnea symptoms or the presence of associated medical conditions such as Hypertension, Congestive Heart Failure, Atrial Fibrillation or Obesity.  The most common symptoms of OSA are snoring, gasping for breath while sleeping, daytime sleepiness and fatigue.   2.  Initiating apnea therapy is recommended given the presence of symptoms and/or associated  conditions. Recommend proceeding with one of the following:     a.  Auto-CPAP therapy with a pressure range of 5-20cm H2O.     b.  An oral appliance (OA) that can be obtained from certain dentists with expertise in sleep medicine.  These are primarily of use in non-obese patients with mild and moderate disease.     c.  An ENT consultation which may be useful to look for specific causes of obstruction and possible treatment options.     d.  If patient is intolerant to PAP therapy, consider referral to ENT for evaluation for hypoglossal nerve stimulator.   3.  Close follow-up is necessary to ensure success with CPAP or oral appliance therapy for maximum benefit.  4.  A follow-up oximetry study on CPAP is recommended to assess the adequacy of therapy and determine the need for supplemental oxygen or the potential need for Bi-level therapy.  An arterial blood gas to determine the adequacy of baseline ventilation and oxygenation should also be considered.  5.  Healthy sleep recommendations include:  adequate nightly sleep (normal 7-9 hrs/night), avoidance of caffeine after noon and alcohol near bedtime, and maintaining a sleep environment that is cool, dark and quiet.  6.  Weight loss for overweight patients is recommended.  Even modest amounts of weight loss can significantly improve the severity of sleep apnea.  7.  Snoring recommendations include:  weight loss where appropriate, side sleeping, and avoidance of alcohol before bed.  8.  Operation of motor vehicle should be avoided when sleepy.  Signature: Fransico Him,  MD; Ruthann Cancer; Diplomat, American Board of Sleep Medicine Electronically Signed: 09/19/22

## 2022-09-21 ENCOUNTER — Ambulatory Visit: Payer: Medicare Other | Attending: Cardiology

## 2022-09-21 DIAGNOSIS — R072 Precordial pain: Secondary | ICD-10-CM

## 2022-09-21 DIAGNOSIS — Z8249 Family history of ischemic heart disease and other diseases of the circulatory system: Secondary | ICD-10-CM

## 2022-09-21 DIAGNOSIS — E7849 Other hyperlipidemia: Secondary | ICD-10-CM

## 2022-09-25 DIAGNOSIS — E119 Type 2 diabetes mellitus without complications: Secondary | ICD-10-CM | POA: Diagnosis not present

## 2022-09-26 NOTE — Telephone Encounter (Signed)
Follow Up:     Patient is calling to find out about her Sleep Study results.

## 2022-09-27 ENCOUNTER — Telehealth: Payer: Self-pay | Admitting: *Deleted

## 2022-09-27 DIAGNOSIS — G4733 Obstructive sleep apnea (adult) (pediatric): Secondary | ICD-10-CM

## 2022-09-27 DIAGNOSIS — L729 Follicular cyst of the skin and subcutaneous tissue, unspecified: Secondary | ICD-10-CM | POA: Diagnosis not present

## 2022-09-27 NOTE — Telephone Encounter (Signed)
-----   Message from Gaynelle Cage, [place] sent at 09/20/2022  9:10 AM EDT -----  ----- Message ----- From: Quintella Reichert, MD Sent: 09/19/2022   5:32 PM EDT To: [facility] Sleep Studies  Please let patient know that they have sleep apnea.  Recommend therapeutic CPAP titration ASAP for treatment of patient's sleep disordered breathing.  If unable to perform an in lab titration then initiate ResMed auto CPAP from 4 to 15cm H2O with heated humidity and mask of choice and overnight pulse ox on CPAP.

## 2022-09-27 NOTE — Telephone Encounter (Signed)
Left detailed message on voicemail and informed patient to call back. 

## 2022-09-27 NOTE — Telephone Encounter (Signed)
Return call: The patient has been notified of the result and verbalized understanding.  All questions (if any) were answered. Blumstein, Aunesty Tyson Green, CMA 09/27/2022 12:59 PM     Will precert titration   

## 2022-09-27 NOTE — Addendum Note (Signed)
Addended by: Reesa Chew on: 09/27/2022 01:02 PM   Modules accepted: Orders

## 2022-09-29 ENCOUNTER — Emergency Department (HOSPITAL_COMMUNITY)
Admission: EM | Admit: 2022-09-29 | Discharge: 2022-09-29 | Disposition: A | Discharge status: Home / Self Care | Payer: Medicare Other | Attending: Emergency Medicine | Admitting: Emergency Medicine

## 2022-09-29 ENCOUNTER — Emergency Department (HOSPITAL_COMMUNITY): Payer: Medicare Other

## 2022-09-29 ENCOUNTER — Encounter (HOSPITAL_COMMUNITY): Payer: Self-pay

## 2022-09-29 ENCOUNTER — Ambulatory Visit (HOSPITAL_COMMUNITY)
Admission: RE | Admit: 2022-09-29 | Discharge: 2022-09-29 | Disposition: A | Discharge status: Other Acute Inpatient Hospital | Payer: Medicare Other | Source: Ambulatory Visit | Attending: Internal Medicine | Admitting: Internal Medicine

## 2022-09-29 VITALS — BP 72/40 | HR 114 | Temp 98.9°F | Resp 20

## 2022-09-29 DIAGNOSIS — E860 Dehydration: Secondary | ICD-10-CM | POA: Diagnosis not present

## 2022-09-29 DIAGNOSIS — J101 Influenza due to other identified influenza virus with other respiratory manifestations: Secondary | ICD-10-CM | POA: Diagnosis not present

## 2022-09-29 DIAGNOSIS — R0602 Shortness of breath: Secondary | ICD-10-CM | POA: Diagnosis not present

## 2022-09-29 DIAGNOSIS — R062 Wheezing: Secondary | ICD-10-CM | POA: Diagnosis not present

## 2022-09-29 DIAGNOSIS — R000 Tachycardia, unspecified: Secondary | ICD-10-CM | POA: Insufficient documentation

## 2022-09-29 DIAGNOSIS — R051 Acute cough: Secondary | ICD-10-CM

## 2022-09-29 DIAGNOSIS — R1031 Right lower quadrant pain: Secondary | ICD-10-CM | POA: Diagnosis not present

## 2022-09-29 DIAGNOSIS — Z7984 Long term (current) use of oral hypoglycemic drugs: Secondary | ICD-10-CM | POA: Diagnosis not present

## 2022-09-29 DIAGNOSIS — E1165 Type 2 diabetes mellitus with hyperglycemia: Secondary | ICD-10-CM | POA: Diagnosis not present

## 2022-09-29 DIAGNOSIS — L98491 Non-pressure chronic ulcer of skin of other sites limited to breakdown of skin: Secondary | ICD-10-CM | POA: Diagnosis not present

## 2022-09-29 DIAGNOSIS — I959 Hypotension, unspecified: Secondary | ICD-10-CM | POA: Diagnosis not present

## 2022-09-29 DIAGNOSIS — N281 Cyst of kidney, acquired: Secondary | ICD-10-CM | POA: Diagnosis not present

## 2022-09-29 DIAGNOSIS — A419 Sepsis, unspecified organism: Secondary | ICD-10-CM | POA: Diagnosis not present

## 2022-09-29 DIAGNOSIS — Z1152 Encounter for screening for COVID-19: Secondary | ICD-10-CM | POA: Insufficient documentation

## 2022-09-29 DIAGNOSIS — R059 Cough, unspecified: Secondary | ICD-10-CM | POA: Diagnosis present

## 2022-09-29 DIAGNOSIS — R509 Fever, unspecified: Secondary | ICD-10-CM | POA: Diagnosis not present

## 2022-09-29 LAB — CBC WITH DIFFERENTIAL/PLATELET
Abs Immature Granulocytes: 0.03 10*3/uL (ref 0.00–0.07)
Basophils Absolute: 0 10*3/uL (ref 0.0–0.1)
Basophils Relative: 1 %
Eosinophils Absolute: 0 10*3/uL (ref 0.0–0.5)
Eosinophils Relative: 0 %
HCT: 43.3 % (ref 36.0–46.0)
Hemoglobin: 13.6 g/dL (ref 12.0–15.0)
Immature Granulocytes: 0 %
Lymphocytes Relative: 11 %
Lymphs Abs: 0.9 10*3/uL (ref 0.7–4.0)
MCH: 27.8 pg (ref 26.0–34.0)
MCHC: 31.4 g/dL (ref 30.0–36.0)
MCV: 88.5 fL (ref 80.0–100.0)
Monocytes Absolute: 0.7 10*3/uL (ref 0.1–1.0)
Monocytes Relative: 9 %
Neutro Abs: 6.5 10*3/uL (ref 1.7–7.7)
Neutrophils Relative %: 79 %
Platelets: 220 10*3/uL (ref 150–400)
RBC: 4.89 MIL/uL (ref 3.87–5.11)
RDW: 14.4 % (ref 11.5–15.5)
WBC: 8.1 10*3/uL (ref 4.0–10.5)
nRBC: 0 % (ref 0.0–0.2)

## 2022-09-29 LAB — COMPREHENSIVE METABOLIC PANEL
ALT: 18 U/L (ref 0–44)
AST: 21 U/L (ref 15–41)
Albumin: 3.6 g/dL (ref 3.5–5.0)
Alkaline Phosphatase: 78 U/L (ref 38–126)
Anion gap: 14 (ref 5–15)
BUN: 10 mg/dL (ref 8–23)
CO2: 24 mmol/L (ref 22–32)
Calcium: 9 mg/dL (ref 8.9–10.3)
Chloride: 101 mmol/L (ref 98–111)
Creatinine, Ser: 0.85 mg/dL (ref 0.44–1.00)
GFR, Estimated: >60 mL/min (ref >60)
Glucose, Bld: 174 mg/dL — ABNORMAL HIGH (ref 70–99)
Potassium: 3.6 mmol/L (ref 3.5–5.1)
Sodium: 139 mmol/L (ref 135–145)
Total Bilirubin: 0.7 mg/dL (ref 0.3–1.2)
Total Protein: 6.7 g/dL (ref 6.5–8.1)

## 2022-09-29 LAB — RESP PANEL BY RT-PCR (FLU A&B, COVID) ARPGX2
Influenza A by PCR: POSITIVE — AB
Influenza B by PCR: NEGATIVE
SARS Coronavirus 2 by RT PCR: NEGATIVE

## 2022-09-29 LAB — CBG MONITORING, ED: Glucose-Capillary: 163 mg/dL — ABNORMAL HIGH (ref 70–99)

## 2022-09-29 LAB — LACTIC ACID, PLASMA
Lactic Acid, Venous: 1.8 mmol/L (ref 0.5–1.9)
Lactic Acid, Venous: 1.3 mmol/L (ref 0.5–1.9)

## 2022-09-29 MED ORDER — SODIUM CHLORIDE 0.9 % IV BOLUS
250.0000 mL | Freq: Once | INTRAVENOUS | Status: AC
  Administered 2022-09-29: 250 mL via INTRAVENOUS

## 2022-09-29 MED ORDER — ONDANSETRON 4 MG PO TBDP: 4.0000 mg | ORAL_TABLET | Freq: Three times a day (TID) | ORAL | 0 refills | Status: DC | PRN

## 2022-09-29 MED ORDER — IOHEXOL 350 MG/ML SOLN
75.0000 mL | Freq: Once | INTRAVENOUS | Status: AC | PRN
  Administered 2022-09-29: 75 mL via INTRAVENOUS

## 2022-09-29 MED ORDER — GUAIFENESIN 100 MG/5ML PO LIQD: 100.0000 mg | ORAL | 0 refills | Status: DC | PRN

## 2022-09-29 MED ORDER — ACETAMINOPHEN 500 MG PO TABS
1000.0000 mg | ORAL_TABLET | Freq: Once | ORAL | Status: AC
  Administered 2022-09-29: 1000 mg via ORAL
  Filled 2022-09-29: qty 2

## 2022-09-29 MED ORDER — BACITRACIN ZINC 500 UNIT/GM EX OINT: 1.0000 | TOPICAL_OINTMENT | Freq: Two times a day (BID) | CUTANEOUS | 0 refills | Status: DC

## 2022-09-29 MED ORDER — IPRATROPIUM-ALBUTEROL 0.5-2.5 (3) MG/3ML IN SOLN
RESPIRATORY_TRACT | Status: AC
  Filled 2022-09-29: qty 3

## 2022-09-29 MED ORDER — BENZONATATE 100 MG PO CAPS: 100.0000 mg | ORAL_CAPSULE | Freq: Three times a day (TID) | ORAL | 0 refills | Status: DC

## 2022-09-29 MED ORDER — LACTATED RINGERS IV BOLUS
1000.0000 mL | Freq: Once | INTRAVENOUS | Status: AC
  Administered 2022-09-29: 1000 mL via INTRAVENOUS

## 2022-09-29 MED ORDER — IPRATROPIUM-ALBUTEROL 0.5-2.5 (3) MG/3ML IN SOLN
3.0000 mL | Freq: Once | RESPIRATORY_TRACT | Status: AC
  Administered 2022-09-29: 3 mL via RESPIRATORY_TRACT

## 2022-09-29 NOTE — ED Triage Notes (Signed)
Pt. Was sent to [place] from urgent care with a sore/abscess on her rt. Lower abdominal area.  She denies any pain, is not sure why she has it, and she reports that it is draining.

## 2022-09-29 NOTE — Discharge Instructions (Signed)
Please go to the nearest emergency department for further evaluation as I believe that you may have a severe infection.

## 2022-09-29 NOTE — ED Notes (Signed)
Patient is being discharged from the Urgent Care and sent to the Emergency Department via POV . Per Reita May, FNP, patient is in need of higher level of care due to needing a higher level of care. Patient is aware and verbalizes understanding of plan of care.  Vitals:   09/29/22 1017 09/29/22 1029  BP:  (!) 72/40  Pulse:  (!) 114  Resp:    Temp:    SpO2: (!) 89% 92%

## 2022-09-29 NOTE — ED Provider Notes (Signed)
[facility] EMERGENCY DEPARTMENT Provider Note   CSN: [id number] Arrival date & time: 09/29/22  1120     History  Chief Complaint  Patient presents with   Wound Infection    Kelly Taylor is a 71 y.o. female.  HPI 71 year old female with a history of diabetes presents with cough and fever.  She was sent over from urgent care due to hypotension.  She states all of her symptoms started last night.  She did have an episode of vomiting last night and has had a productive cough with a fever up to 101.  She feels short of breath.  She is also had an abdominal wound to her right lower abdomen with occasional drainage for about 1 week as well.  She is not sure if it is growing, has not been able to see down there.  However she does not think it is growing and it is a little bit painful.  No urinary symptoms.  Home Medications Prior to Admission medications   Medication Sig Start Date End Date Taking? Authorizing Provider  bacitracin ointment Apply 1 Application topically 2 (two) times daily. 09/29/22  Yes Pricilla Loveless, MD  benzonatate (TESSALON) 100 MG capsule Take 1 capsule (100 mg total) by mouth every 8 (eight) hours. 09/29/22  Yes Pricilla Loveless, MD  guaiFENesin (ROBITUSSIN) 100 MG/5ML liquid Take 5-10 mLs (100-200 mg total) by mouth every 4 (four) hours as needed for cough or to loosen phlegm. 09/29/22  Yes Pricilla Loveless, MD  ondansetron (ZOFRAN-ODT) 4 MG disintegrating tablet Take 1 tablet (4 mg total) by mouth every 8 (eight) hours as needed for nausea or vomiting. 09/29/22  Yes Pricilla Loveless, MD  albuterol (PROVENTIL HFA;VENTOLIN HFA) 108 (90 BASE) MCG/ACT inhaler Inhale 2 puffs into the lungs every 4 (four) hours as needed for wheezing or shortness of breath. 11/15/15   Tharon Aquas, PA  calcium carbonate (OS-CAL) 600 MG TABS tablet Take 1,200 mg by mouth daily with breakfast.     [provider]  celecoxib (CELEBREX) 200 MG capsule  06/13/19    [provider]  Cyanocobalamin (VITAMIN B-12) 5000 MCG SUBL Place 5,000 mcg under the tongue daily.    [provider]  cyclobenzaprine (FLEXERIL) 10 MG tablet Take 10 mg by mouth 3 (three) times daily. 07/20/22   [provider]  diclofenac sodium (VOLTAREN) 1 % GEL diclofenac 1 % topical gel    [provider]  docusate sodium (COLACE) 100 MG capsule Take 1 capsule (100 mg total) by mouth 3 (three) times daily as needed. 04/20/17   Jiles Harold, PA-C  fluticasone (FLONASE) 50 MCG/ACT nasal spray fluticasone 50 mcg/actuation nasal spray,suspension    [provider]  furosemide (LASIX) 40 MG tablet Take 40 mg by mouth daily. 06/16/19   [provider]  gabapentin (NEURONTIN) 100 MG capsule Take 100 mg by mouth 2 (two) times daily as needed (pain). 07/21/19   [provider]  levothyroxine (SYNTHROID) 75 MCG tablet Take 75 mcg by mouth daily before breakfast. 09/02/19   [provider]  meclizine (ANTIVERT) 25 MG tablet Take 25 mg by mouth 3 (three) times daily as needed for dizziness.    [provider]  metFORMIN (GLUCOPHAGE-XR) 500 MG 24 hr tablet TAKE 1 TABLET BY MOUTH WITH EVENING MEAL EVERY DAY 01/11/17   [provider]  methocarbamol (ROBAXIN) 500 MG tablet Take 1 tablet (500 mg total) by mouth 2 (two) times daily. 04/20/17   Jiles Harold,  PA-C  metoprolol tartrate (LOPRESSOR) 100 MG tablet Take 1 tablet (100 mg total) by mouth Once PRN for up to 1 dose (Take 1 tablet (100 mg) two hours prior to CT scan). 09/04/22   Quintella Reichert, MD  Multiple Vitamins-Minerals (MULTIVITAMIN WITH MINERALS) tablet Take 1 tablet by mouth daily.    [provider]  omeprazole (PRILOSEC) 20 MG capsule Take 20 mg by mouth daily. 11/06/16   [provider]  polyethylene glycol (MIRALAX / GLYCOLAX) packet Take 17 g by mouth daily as needed for mild constipation (for constipation).     [provider]  Probiotic Product (PROBIOTIC DAILY PO) Take 1 capsule by mouth daily.    [provider]  simvastatin (ZOCOR) 10 MG tablet TAKE 1 TABLET BY MOUTH IN THE EVENING EVERY DAY 01/11/17   [provider]  tiZANidine (ZANAFLEX) 2 MG tablet tizanidine 2 mg tablet    [provider]  traMADol (ULTRAM) 50 MG tablet Take 100 mg by mouth every 6 (six) hours as needed for moderate pain.    [provider]  Vitamin D, Ergocalciferol, (DRISDOL) 1.25 MG (50000 UNIT) CAPS capsule Take 1 capsule (50,000 Units total) by mouth every 7 (seven) days. 08/31/22   Bowen, Scot Jun, DO      Allergies    Oxycodone hcl, Penicillins, Lyrica [pregabalin], Relafen [nabumetone], and Sulfa antibiotics    Review of Systems   Review of Systems  Constitutional:  Positive for fever.  Respiratory:  Positive for cough and shortness of breath.   Gastrointestinal:  Positive for abdominal pain and vomiting.  Musculoskeletal:  Negative for myalgias.  Skin:  Positive for wound.  Neurological:  Negative for headaches.    Physical Exam Updated Vital Signs BP (!) 103/91   Pulse 92   Temp 97.6 F (36.4 C) (Axillary)   Resp (!) 22   SpO2 96%  Physical Exam Vitals and nursing note reviewed.  Constitutional:      Appearance: She is well-developed.  HENT:     Head: Normocephalic and atraumatic.  Cardiovascular:     Rate and Rhythm: Regular rhythm. Tachycardia present.     Heart sounds: Normal heart sounds.  Pulmonary:     Effort: Pulmonary effort is normal.     Breath sounds: Normal breath sounds. No wheezing.  Abdominal:     Palpations: Abdomen is soft.     Tenderness: There is abdominal tenderness in the right lower quadrant.    Skin:    General: Skin is warm and dry.  Neurological:     Mental Status: She is alert.     ED Results / Procedures / Treatments   Labs (all labs ordered are listed, but only abnormal results are displayed) Labs Reviewed  RESP PANEL BY  RT-PCR (FLU A&B, COVID) ARPGX2 - Abnormal; Notable for the following components:      Result Value   Influenza A by PCR POSITIVE (*)    All other components within normal limits  COMPREHENSIVE METABOLIC PANEL - Abnormal; Notable for the following components:   Glucose, Bld 174 (*)    All other components within normal limits  CULTURE, BLOOD (ROUTINE X 2)  CULTURE, BLOOD (ROUTINE X 2)  LACTIC ACID, PLASMA  LACTIC ACID, PLASMA  CBC WITH DIFFERENTIAL/PLATELET    EKG EKG Interpretation  Date/Time:  Friday September 29 2022 11:28:25 EST Ventricular Rate:  122 PR Interval:  132 QRS Duration: 88 QT Interval:  326 QTC Calculation: 464 R Axis:   -14  Text Interpretation: Sinus tachycardia Otherwise normal ECG Confirmed by Pricilla Loveless [phone]) on 09/29/2022 3:33:59 PM  Radiology CT ABDOMEN PELVIS W CONTRAST  Result Date: 09/29/2022 CLINICAL DATA:  Sepsis. complains of fever, shortness of breath, cough, and wound to her abdomen. Patient has had wound for 1 week, previously was cyst there Sent over by urgent care for concern of sepsis. She was hypotensive EXAM: CT ABDOMEN AND PELVIS WITH CONTRAST TECHNIQUE: Multidetector CT imaging of the abdomen and pelvis was performed using the standard protocol following bolus administration of intravenous contrast. RADIATION DOSE REDUCTION: This exam was performed according to the departmental dose-optimization program which includes automated exposure control, adjustment of the mA and/or kV according to patient size and/or use of iterative reconstruction technique. CONTRAST:  33mL OMNIPAQUE IOHEXOL 350 MG/ML SOLN COMPARISON:  None Available. FINDINGS: Lower chest: Bronchial wall thickening. Bibasilar linear atelectasis versus scarring. Coronary artery calcification Hepatobiliary: No focal liver abnormality. Status post cholecystectomy. No biliary dilatation. Pancreas: No focal lesion. Normal pancreatic contour. No surrounding inflammatory changes. No main  pancreatic ductal dilatation. Spleen: Normal in size without focal abnormality. Adrenals/Urinary Tract: No adrenal nodule bilaterally. Bilateral kidneys enhance symmetrically. There is a 7.4 cm fluid density lesion within left kidney likely a simple renal cyst. Simple renal cysts, in the absence of clinically indicated signs/symptoms, require no independent follow-up. No hydronephrosis. No hydroureter. The urinary bladder is unremarkable. On delayed imaging, there is no urothelial wall thickening and there are no filling defects in the opacified portions of the bilateral collecting systems or ureters. Stomach/Bowel: Stomach is within normal limits. No evidence of bowel wall thickening or dilatation. Appendix appears normal. Vascular/Lymphatic: No abdominal aorta or iliac aneurysm. Mild atherosclerotic plaque of the aorta and its branches. No abdominal, pelvic, or inguinal lymphadenopathy. Reproductive: Status post hysterectomy. No adnexal masses. Other: No intraperitoneal free fluid. No intraperitoneal free gas. No organized fluid collection. Musculoskeletal: Healed anterior midline incision. No dermal thickening, subcutaneus soft tissue edema organized fluid collection noted. Tiny fat containing left inguinal hernia (7:65, 3:95). Right gluteal neural stimulator with leads entering through a right sacral foramina and terminating along the right presacral space. No suspicious lytic or blastic osseous lesions. No acute displaced fracture. L3-L4 posterolateral and interbody fusion surgical hardware. Multilevel severe degenerative changes of the spine with associated multilevel intervertebral vacuum phenomenon. Partially visualized total right hip arthroplasty. IMPRESSION: 1. Healed anterior midline incision. No dermal thickening, subcutaneus soft tissue edema organized fluid collection noted. 2. Tiny fat containing left inguinal hernia. 3. Mild bronchial wall thickening. Correlate with small airway disease. 4.  Otherwise no acute intra-abdominal intrapelvic abnormality. 5.  Aortic Atherosclerosis (ICD10-I70.0). Electronically Signed   By: Tish Frederickson M.D.   On: 09/29/2022 17:29   DG Chest 2 View  Result Date: 09/29/2022 CLINICAL DATA:  71 year old female with shortness of breath. Fever. Abdominal wound and diabetes. EXAM: CHEST - 2 VIEW COMPARISON:  Chest radiographs 04/13/2017 and earlier. FINDINGS: Lower lung volumes. Mediastinal contours remain within normal limits. Mild crowding of lung markings, but otherwise both lungs appear clear. No pneumothorax or pleural effusion. Negative visible bowel gas. Subjacent bowel gas but no pneumoperitoneum identified. New left shoulder arthroplasty. No acute osseous abnormality identified. IMPRESSION: No acute cardiopulmonary abnormality. Electronically Signed   By: Odessa Fleming M.D.   On: 09/29/2022 12:00    Procedures Procedures    Medications Ordered in ED Medications  lactated ringers bolus 1,000 mL (0 mLs Intravenous Stopped 09/29/22 1910)  acetaminophen (TYLENOL) tablet 1,000 mg (1,000 mg Oral Given  09/29/22 1611)  iohexol (OMNIPAQUE) 350 MG/ML injection 75 mL (75 mLs Intravenous Contrast Given 09/29/22 1715)    ED Course/ Medical Decision Making/ A&P                           Medical Decision Making Amount and/or Complexity of Data Reviewed External Data Reviewed: notes. Labs:     Details: Normal WBC, electrolytes besides mild hyperglycemia, and normal lactate. Radiology: ordered and independent interpretation performed.    Details: Chest x-ray shows no obvious pneumonia ECG/medicine tests: independent interpretation performed.    Details: Sinus tachycardia without ischemia.  Risk OTC drugs. Prescription drug management.   I suspect patient's fever and respiratory symptoms are from the influenza.  I did discuss with her the possibility of Tamiflu given her age and diabetes.  However she states this actually started probably closer to 48 hours  ago and after discussion she would prefer not to start Tamiflu.  I do not think this is unreasonable.  Her tachycardia resolved with Tylenol and fluids.  As for her wound, it is pretty superficial and I doubt causing her acute symptoms.  There is appears to be no deep space infection on CT of the abdomen and pelvis.  Given this, I think topical antibiotics are warranted as this is more of just a nonhealing wound that I think an acute infection.  Either way, I do not know that oral antibiotics are warranted.  For now we will discharge home with supportive care including Zofran, cough suppressant, and bacitracin.  Will discharge home with return precautions.        Final Clinical Impression(s) / ED Diagnoses Final diagnoses:  Influenza A  Abdominal wall skin ulcer, limited to breakdown of skin (HCC)    Rx / DC Orders ED Discharge Orders          Ordered    benzonatate (TESSALON) 100 MG capsule  Every 8 hours        09/29/22 1754    guaiFENesin (ROBITUSSIN) 100 MG/5ML liquid  Every 4 hours PRN        09/29/22 1754    bacitracin ointment  2 times daily        09/29/22 1754    ondansetron (ZOFRAN-ODT) 4 MG disintegrating tablet  Every 8 hours PRN        09/29/22 1754              Pricilla Loveless, MD 09/29/22 2351

## 2022-09-29 NOTE — ED Provider Triage Note (Signed)
Emergency Medicine Provider Triage Evaluation Note  Kelly Taylor , a 71 y.o. female  was evaluated in triage.  Pt complains of fever, shortness of breath, cough, and wound to her abdomen.  Patient has had wound for 1 week, previously was cyst there  Sent over by urgent care for concern of sepsis.  She was hypotensive at [facility], normotensive in triage.  History significant for DM2, asthma.  Denies vomiting, diarrhea, abdominal pain, urinary changes.    Review of Systems  Positive: See above Negative: See above  Physical Exam  BP 137/86 (BP Location: Right Arm)   Pulse (!) 123   Temp 99.9 F (37.7 C) (Oral)   Resp (!) 24   SpO2 91%  Gen:   Awake, no distress   Resp:  Normal effort, lungs clear bilaterally, tachypnea   MSK:   Moves extremities without difficulty  Other:  Ulcerative wound to right lower abdomen with surrounding erythema and warmth  Medical Decision Making  Medically screening exam initiated at 11:37 AM.  Appropriate orders placed.  Kelly Taylor was informed that the remainder of the evaluation will be completed by another provider, this initial triage assessment does not replace that evaluation, and the importance of remaining in the ED until their evaluation is complete.     Melton Alar R, [place] 09/29/22 1142

## 2022-09-29 NOTE — ED Provider Notes (Signed)
[facility]    CSN: [id number] Arrival date & time: 09/29/22  4010      History   Chief Complaint Chief Complaint  Patient presents with   Cough   Headache   Fever    HPI Kelly Taylor is a 71 y.o. female.   Patient presents urgent care for 2-day history of cough, congestion, shortness of breath, and fever.  Last night she developed a generalized headache and fever.  Fever last night was 101.3 and she states this responded well to Excedrin use.  Cough is productive with clear/yellow sputum.  She reports generalized body aches and states she "does not feel good".  She experienced a couple of episodes of nonbilious/bloody emesis yesterday and today but states she is not currently nauseous.  Denies abdominal pain, dizziness, vision changes, chest pain, diarrhea, blood/mucus of the stools, and recent falls.  She has received all of her COVID-19 vaccines and has been vaccinated against influenza this year.  Son was sick with influenza recently but patient states that she attempted to do her best to stay away from him and not get sick.  She has a history of asthma and denies recent hospitalization due to recent asthma exacerbation.  She would also like to be evaluated for a wound to the right lower quadrant abdomen that has been present for the last week.  She states the wound "popped up out of nowhere" and she has been placing Neosporin ointment and using gauze to cover the wound.  No known bites, trauma to the right lower quadrant abdomen, or recent exposure to chemicals/irritants.  She is a type II diabetic and states her sugars have been well controlled.  She has continued to take her medications for diabetes while she has been sick. Patient's blood pressure is noticeably low.  She does not take any antihypertensive medications and is not currently dizzy, lightheaded, or weak.  Pulse rate is elevated but she denies chest pain, heart palpitations, and upper back discomfort.  She  is currently afebrile.     Past Medical History:  Diagnosis Date   Anemia    hx of in 20's   Aortic atherosclerosis (HCC)    Arthritis    Ascending aorta dilatation (HCC)    75mm by CT 10/23   Asthma    Back pain    Breast mass, left 2003   Chronic back pain    seeing Pain specialist   Complication of anesthesia    "shallow breathing with general"   Constipation    DDD (degenerative disc disease)    Depression    Diabetes mellitus    borderline   DJD (degenerative joint disease)    Family history of heart attack    GERD (gastroesophageal reflux disease)    H/O scarlet fever    Hemorrhoids    History of chicken pox    History of measles, mumps, or rubella as child   all 3   Hx of bladder infections    Hypothyroidism    Irritable bowel syndrome    Joint pain    Lumbar stenosis    RFA done May 2015   Menopausal symptoms 2003   Obesity, morbid (HCC) 2006   Pericarditis    hx of   Pneumonia    hx of 5 years ago   Polyp of colon    removed during colonoscopy   SOBOE (shortness of breath on exertion)    Spinal headache    x1  Thyroid condition    Trichomonas    Urge incontinence 2005   Vertigo    over a year was last episode   Yeast in stool     Patient Active Problem List   Diagnosis Date Noted   Ascending aorta dilatation (HCC) 09/14/2022   Spinal stenosis of lumbar region 08/31/2022   Excessive vitamin B12 intake 08/02/2022   Type 2 diabetes mellitus with other specified complication (HCC) 08/02/2022   Asthma 08/02/2022   Other fatigue 07/18/2022   DOE (dyspnea on exertion) 07/18/2022   Vitamin D deficiency 07/18/2022   Other hyperlipidemia 07/18/2022   Right axis deviation on EKG 07/18/2022   Morbid obesity (HCC) 01/16/2018   Type 2 diabetes mellitus (HCC) 01/16/2018   Arthritis 08/03/2017   Disease of thyroid gland 08/03/2017   GERD (gastroesophageal reflux disease) 08/03/2017   IBS (irritable bowel syndrome) 08/03/2017   Pain of breast  08/03/2017   Urge incontinence 08/03/2017   Shortness of breath 08/03/2017   S/P reverse total shoulder arthroplasty, left 04/19/2017   Trochanteric bursitis, right hip 03/28/2017   Arthritis of right hip 07/30/2014   Status post total replacement of right hip 07/30/2014   Postoperative anemia due to acute blood loss 04/08/2013   OA (osteoarthritis) of knee 04/07/2013   Breast lump 11/20/2001    Past Surgical History:  Procedure Laterality Date   ABDOMINAL HYSTERECTOMY  02/1990   APPENDECTOMY  age 20   BLADDER SUSPENSION     BREAST LUMPECTOMY Left 2003   BREAST REDUCTION SURGERY  1992   CHOLECYSTECTOMY  2010   COLONOSCOPY  may 2015   4 polyps-benign   DILATION AND CURETTAGE OF UTERUS     FOOT SURGERY Right    pinched nerve   HAND SURGERY Right    right-Dr. Amanda Pea   HERNIA REPAIR Right 1992   INTERSTIM IMPLANT PLACEMENT Right 2003   for incontinence   KNEE SURGERY     bilateral knee; x3 right/ x2 left   REVERSE SHOULDER ARTHROPLASTY Left 04/19/2017   Procedure: REVERSE SHOULDER ARTHROPLASTY;  Surgeon: Vultaggio Broom, MD;  Location: [facility];  Service: Orthopedics;  Laterality: Left;  REVERSE SHOULDER ARTHROPLASTY   ROTATOR CUFF REPAIR  2008   right, almost complete reconstruction, bone anchors   TONSILLECTOMY  age 26   TOTAL HIP ARTHROPLASTY Right 07/30/2014   Procedure: RIGHT TOTAL HIP ARTHROPLASTY ANTERIOR APPROACH;  Surgeon: Kathryne Hitch, MD;  Location: [facility];  Service: Orthopedics;  Laterality: Right;   TOTAL KNEE ARTHROPLASTY Right 04/07/2013   Procedure: RIGHT TOTAL KNEE ARTHROPLASTY;  Surgeon: Loanne Drilling, MD;  Location: [facility];  Service: Orthopedics;  Laterality: Right;   TOTAL KNEE ARTHROPLASTY Left 09/22/2013   Procedure: LEFT TOTAL KNEE ARTHROPLASTY;  Surgeon: Loanne Drilling, MD;  Location: [facility];  Service: Orthopedics;  Laterality: Left;    OB History     Gravida  3   Para      Term      Preterm      AB      Living         SAB       IAB      Ectopic      Multiple      Live Births               Home Medications    Prior to Admission medications   Medication Sig Start Date End Date Taking? Authorizing Provider  albuterol (PROVENTIL HFA;VENTOLIN HFA) 108 (90 BASE)  MCG/ACT inhaler Inhale 2 puffs into the lungs every 4 (four) hours as needed for wheezing or shortness of breath. 11/15/15  Yes Tharon Aquas, PA  calcium carbonate (OS-CAL) 600 MG TABS tablet Take 1,200 mg by mouth daily with breakfast.    Yes [provider]  celecoxib (CELEBREX) 200 MG capsule  06/13/19  Yes [provider]  Cyanocobalamin (VITAMIN B-12) 5000 MCG SUBL Place 5,000 mcg under the tongue daily.   Yes [provider]  cyclobenzaprine (FLEXERIL) 10 MG tablet Take 10 mg by mouth 3 (three) times daily. 07/20/22  Yes [provider]  diclofenac sodium (VOLTAREN) 1 % GEL diclofenac 1 % topical gel   Yes [provider]  docusate sodium (COLACE) 100 MG capsule Take 1 capsule (100 mg total) by mouth 3 (three) times daily as needed. 04/20/17  Yes Jiles Harold, PA-C  fluticasone (FLONASE) 50 MCG/ACT nasal spray fluticasone 50 mcg/actuation nasal spray,suspension   Yes [provider]  gabapentin (NEURONTIN) 100 MG capsule Take 100 mg by mouth 2 (two) times daily as needed (pain). 07/21/19  Yes [provider]  levothyroxine (SYNTHROID) 75 MCG tablet Take 75 mcg by mouth daily before breakfast. 09/02/19  Yes [provider]  meclizine (ANTIVERT) 25 MG tablet Take 25 mg by mouth 3 (three) times daily as needed for dizziness.   Yes [provider]  metFORMIN (GLUCOPHAGE-XR) 500 MG 24 hr tablet TAKE 1 TABLET BY MOUTH WITH EVENING MEAL EVERY DAY 01/11/17  Yes [provider]  methocarbamol (ROBAXIN) 500 MG tablet Take 1 tablet (500 mg total) by mouth 2 (two) times daily. 04/20/17  Yes Jiles Harold, PA-C  metoprolol tartrate (LOPRESSOR) 100 MG tablet Take 1  tablet (100 mg total) by mouth Once PRN for up to 1 dose (Take 1 tablet (100 mg) two hours prior to CT scan). 09/04/22  Yes Turner, Cornelious Bryant, MD  Multiple Vitamins-Minerals (MULTIVITAMIN WITH MINERALS) tablet Take 1 tablet by mouth daily.   Yes [provider]  omeprazole (PRILOSEC) 20 MG capsule Take 20 mg by mouth daily. 11/06/16  Yes [provider]  polyethylene glycol (MIRALAX / GLYCOLAX) packet Take 17 g by mouth daily as needed for mild constipation (for constipation).    Yes [provider]  Probiotic Product (PROBIOTIC DAILY PO) Take 1 capsule by mouth daily.   Yes [provider]  simvastatin (ZOCOR) 10 MG tablet TAKE 1 TABLET BY MOUTH IN THE EVENING EVERY DAY 01/11/17  Yes [provider]  tiZANidine (ZANAFLEX) 2 MG tablet tizanidine 2 mg tablet   Yes [provider]  traMADol (ULTRAM) 50 MG tablet Take 100 mg by mouth every 6 (six) hours as needed for moderate pain.   Yes [provider]  Vitamin D, Ergocalciferol, (DRISDOL) 1.25 MG (50000 UNIT) CAPS capsule Take 1 capsule (50,000 Units total) by mouth every 7 (seven) days. 08/31/22  Yes Bowen, Scot Jun, DO  bacitracin ointment Apply 1 Application topically 2 (two) times daily. 09/29/22   Pricilla Loveless, MD  benzonatate (TESSALON) 100 MG capsule Take 1 capsule (100 mg total) by mouth every 8 (eight) hours. 09/29/22   Pricilla Loveless, MD  furosemide (LASIX) 40 MG tablet Take 40 mg by mouth daily. 06/16/19   [provider]  guaiFENesin (ROBITUSSIN) 100 MG/5ML liquid Take 5-10 mLs (100-200 mg total) by mouth every 4 (four) hours as needed for cough or to loosen phlegm. 09/29/22   Pricilla Loveless, MD  ondansetron (ZOFRAN-ODT) 4 MG disintegrating tablet Take  1 tablet (4 mg total) by mouth every 8 (eight) hours as needed for nausea or vomiting. 09/29/22   Pricilla Loveless, MD    Family History Family History  Problem Relation Age of Onset   Cancer Mother    Hypertension  Mother    Diabetes Mother    Lung cancer Mother    Heart disease Mother    Obesity Mother    Depression Father    Stroke Paternal Grandfather    Ovarian cancer Maternal Aunt    Breast cancer Maternal Aunt     Social History Social History   Tobacco Use   Smoking status: Never   Smokeless tobacco: Never  Vaping Use   Vaping Use: Never used  Substance Use Topics   Alcohol use: Yes    Comment: 1 or 2 glasses a week of wine   Drug use: No     Allergies   Oxycodone hcl, Penicillins, Lyrica [pregabalin], Relafen [nabumetone], and Sulfa antibiotics   Review of Systems Review of Systems Per HPI  Physical Exam Triage Vital Signs ED Triage Vitals  Enc Vitals Group     BP 09/29/22 1012 (!) 69/46     Pulse Rate 09/29/22 1012 94     Resp 09/29/22 1012 20     Temp 09/29/22 1012 98.9 F (37.2 C)     Temp Source 09/29/22 1012 Oral     SpO2 09/29/22 1017 (!) 89 %     Weight --      Height --      Head Circumference --      Peak Flow --      Pain Score 09/29/22 1006 0     Pain Loc --      Pain Edu? --      Excl. in GC? --    No data found.  Updated Vital Signs BP (!) 72/40 (BP Location: Left Arm)   Pulse (!) 114   Temp 98.9 F (37.2 C) (Oral)   Resp 20   SpO2 92%   Visual Acuity Right Eye Distance:   Left Eye Distance:   Bilateral Distance:    Right Eye Near:   Left Eye Near:    Bilateral Near:     Physical Exam Vitals and nursing note reviewed.  Constitutional:      Appearance: Normal appearance. She is ill-appearing. She is not toxic-appearing.  HENT:     Head: Normocephalic and atraumatic.     Right Ear: Hearing, tympanic membrane, ear canal and external ear normal.     Left Ear: Hearing, tympanic membrane, ear canal and external ear normal.     Nose: Congestion and rhinorrhea present.     Mouth/Throat:     Lips: Pink.     Mouth: Mucous membranes are dry.     Pharynx: Posterior oropharyngeal erythema present.  Eyes:     General: Lids are normal.  Vision grossly intact. Gaze aligned appropriately.        Right eye: No discharge.        Left eye: No discharge.     Extraocular Movements: Extraocular movements intact.     Conjunctiva/sclera: Conjunctivae normal.     Right eye: Right conjunctiva is not injected.     Left eye: Left conjunctiva is not injected.     Pupils: Pupils are equal, round, and reactive to light.  Cardiovascular:     Rate and Rhythm: Normal rate and regular rhythm.     Heart sounds: Normal heart sounds, S1 normal  and S2 normal.  Pulmonary:     Effort: Pulmonary effort is normal. No respiratory distress.     Breath sounds: Normal air entry. Wheezing present.     Comments: Diffuse expiratory wheezes to the bilateral lower lung fields.  No acute respiratory distress, retractions, or increased work of breathing. Abdominal:     General: Abdomen is flat. Bowel sounds are normal.     Palpations: Abdomen is soft.     Tenderness: There is abdominal tenderness in the right lower quadrant.     Comments: Pain to palpation surrounding the site of the wound.  Musculoskeletal:     Cervical back: Neck supple.  Skin:    General: Skin is warm and dry.     Capillary Refill: Capillary refill takes less than 2 seconds.     Findings: Wound present. No rash.          Comments: No palpable underlying soft tissue swelling to the wound, although there is pain palpated to the surrounding soft tissue.  Neurological:     General: No focal deficit present.     Mental Status: She is alert and oriented to person, place, and time. Mental status is at baseline.     Cranial Nerves: No dysarthria or facial asymmetry.  Psychiatric:        Mood and Affect: Mood normal.        Speech: Speech normal.        Behavior: Behavior normal.        Thought Content: Thought content normal.        Judgment: Judgment normal.      UC Treatments / Results  Labs (all labs ordered are listed, but only abnormal results are displayed) Labs Reviewed   CBG MONITORING, ED - Abnormal; Notable for the following components:      Result Value   Glucose-Capillary 163 (*)    All other components within normal limits    EKG   Radiology   Procedures Procedures (including critical care time)  Medications Ordered in UC Medications  sodium chloride 0.9 % bolus 250 mL (0 mLs Intravenous Stopped 09/29/22 1103)  ipratropium-albuterol (DUONEB) 0.5-2.5 (3) MG/3ML nebulizer solution 3 mL (3 mLs Nebulization Given 09/29/22 1053)    Initial Impression / Assessment and Plan / UC Course  I have reviewed the triage vital signs and the nursing notes.  Pertinent labs & imaging results that were available during my care of the patient were reviewed by me and considered in my medical decision making (see chart for details).   1.  Hypotension, shortness of breath, acute cough, dehydration Blood pressure is very soft at 72/40 and heart rate is elevated at 114 bpm.  Patient appears to be clinically dehydrated likely due to recent febrile illness and vomiting.  High suspicion for influenza, although unsure if infectious symptoms are related to viral illness versus acute abdominal wound to the right lower quadrant abdomen.  Patient given DuoNeb to help with wheezing in clinic and reports subjective improvement in shortness of breath after DuoNeb.  Lung sounds improved as well post DuoNeb treatment.  IV placed to the left antecubital and patient given 250 mL normal saline IV bolus to help with blood pressure and heart rate.  CBG is 163.   She does meet SIRS criteria due to elevated heart rate, hypotension, and fever. Due to vital signs and possible significant infection, I recommend patient go to the nearest emergency department for further evaluation and urgent blood work to rule out  possible sepsis.  She is agreeable with this.  I recommend [facility] transport due to vital signs, but patient declines and would like to go by personal vehicle.  She is currently  asymptomatic despite hypotension and ambulates with steady gait without dizziness or near syncope.  IV removed prior to discharge from urgent care and patient discharged in stable condition with plan for her to go to the nearest emergency department.  Discussed risks of deferring emergency department visit to which she expresses understanding and agreement.  Final Clinical Impressions(s) / UC Diagnoses   Final diagnoses:  Hypotension, unspecified hypotension type  Acute cough  Wheeze  Shortness of breath  Dehydration     Discharge Instructions      Please go to the nearest emergency department for further evaluation as I believe that you may have a severe infection.     ED Prescriptions   None    PDMP not reviewed this encounter.   Carlisle BeersStanhope, Horatio Bertz M, OregonFNP 10/01/22 1018

## 2022-09-29 NOTE — Discharge Instructions (Addendum)
Your acute infectious symptoms appear to be coming from influenza A.  The treatment is mostly supportive at this point, take ibuprofen, Tylenol, and drink plenty of fluids.  You are being prescribed an antinausea medicine should you need it.  You are also being provided cough medicine.  For the wound on your abdomen, you are being given an antibiotic ointment to apply.  Follow-up with your primary care physician to make sure it is healing.  If at any point your fever is not going away, your cough has blood in it, you develop new or worsening trouble breathing, or any other new/concerning symptoms then return to the ER for evaluation.

## 2022-09-29 NOTE — ED Triage Notes (Signed)
Pt stets she has had a cough x 2 days, over night she developed headache, fever (101.3), body aches and vomiting. She took some Excedrin, and did Vicks rub all over.    She has a abscess/sore on her lower abdominal area she states he wont heal and it is draining it has been there awhile she has been busing neosporin and gauze.

## 2022-10-02 ENCOUNTER — Ambulatory Visit (INDEPENDENT_AMBULATORY_CARE_PROVIDER_SITE_OTHER): Payer: Medicare Other | Admitting: Family Medicine

## 2022-10-02 NOTE — Telephone Encounter (Signed)
Return call: The patient has been notified of the result and verbalized understanding.  All questions (if any) were answered. Latrelle Dodrill, CMA 09/27/2022 12:59 PM     Will precert titration

## 2022-10-04 LAB — CULTURE, BLOOD (ROUTINE X 2)
Culture: NO GROWTH
Culture: NO GROWTH
Special Requests: ADEQUATE

## 2022-10-05 DIAGNOSIS — Z79891 Long term (current) use of opiate analgesic: Secondary | ICD-10-CM | POA: Diagnosis not present

## 2022-10-05 DIAGNOSIS — G894 Chronic pain syndrome: Secondary | ICD-10-CM | POA: Diagnosis not present

## 2022-10-05 DIAGNOSIS — M47816 Spondylosis without myelopathy or radiculopathy, lumbar region: Secondary | ICD-10-CM | POA: Diagnosis not present

## 2022-10-05 DIAGNOSIS — M545 Low back pain, unspecified: Secondary | ICD-10-CM | POA: Diagnosis not present

## 2022-10-05 DIAGNOSIS — Z79899 Other long term (current) drug therapy: Secondary | ICD-10-CM | POA: Diagnosis not present

## 2022-10-23 ENCOUNTER — Ambulatory Visit (INDEPENDENT_AMBULATORY_CARE_PROVIDER_SITE_OTHER): Payer: Medicare Other | Admitting: Podiatry

## 2022-10-23 ENCOUNTER — Ambulatory Visit (INDEPENDENT_AMBULATORY_CARE_PROVIDER_SITE_OTHER): Payer: Medicare Other

## 2022-10-23 VITALS — BP 124/78

## 2022-10-23 DIAGNOSIS — R52 Pain, unspecified: Secondary | ICD-10-CM

## 2022-10-23 DIAGNOSIS — M7662 Achilles tendinitis, left leg: Secondary | ICD-10-CM | POA: Diagnosis not present

## 2022-10-23 MED ORDER — METHYLPREDNISOLONE 4 MG PO TBPK: ORAL_TABLET | ORAL | 0 refills | Status: DC

## 2022-10-23 MED ORDER — BETAMETHASONE SOD PHOS & ACET 6 (3-3) MG/ML IJ SUSP
3.0000 mg | Freq: Once | INTRAMUSCULAR | Status: AC
  Administered 2022-10-23: 3 mg via INTRA_ARTICULAR

## 2022-10-23 NOTE — Progress Notes (Signed)
Chief Complaint  Patient presents with   Foot Pain    Left foot pain     HPI: 71 y.o. female presenting today for new complaint of pain and tenderness associated to left posterior heel.  Patient was last seen in the office 2022.  She does have a past surgical history of triple arthrodesis left.  Denies a history of injury.  The pain has a gradual onset over the past month.  She has not done anything for treatment.  She currently takes Celebrex daily for unrelated issues  Past Medical History:  Diagnosis Date   Anemia    hx of in 20's   Aortic atherosclerosis (HCC)    Arthritis    Ascending aorta dilatation (HCC)    29mm by CT 10/23   Asthma    Back pain    Breast mass, left 2003   Chronic back pain    seeing Pain specialist   Complication of anesthesia    "shallow breathing with general"   Constipation    DDD (degenerative disc disease)    Depression    Diabetes mellitus    borderline   DJD (degenerative joint disease)    Family history of heart attack    GERD (gastroesophageal reflux disease)    H/O scarlet fever    Hemorrhoids    History of chicken pox    History of measles, mumps, or rubella as child   all 3   Hx of bladder infections    Hypothyroidism    Irritable bowel syndrome    Joint pain    Lumbar stenosis    RFA done May 2015   Menopausal symptoms 2003   Obesity, morbid (HCC) 2006   Pericarditis    hx of   Pneumonia    hx of 5 years ago   Polyp of colon    removed during colonoscopy   SOBOE (shortness of breath on exertion)    Spinal headache    x1   Thyroid condition    Trichomonas    Urge incontinence 2005   Vertigo    over a year was last episode   Yeast in stool     Past Surgical History:  Procedure Laterality Date   ABDOMINAL HYSTERECTOMY  02/1990   APPENDECTOMY  age 38   BLADDER SUSPENSION     BREAST LUMPECTOMY Left 2003   BREAST REDUCTION SURGERY  1992   CHOLECYSTECTOMY  2010   COLONOSCOPY  may 2015   4 polyps-benign    DILATION AND CURETTAGE OF UTERUS     FOOT SURGERY Right    pinched nerve   HAND SURGERY Right    right-Dr. Amanda Pea   HERNIA REPAIR Right 1992   INTERSTIM IMPLANT PLACEMENT Right 2003   for incontinence   KNEE SURGERY     bilateral knee; x3 right/ x2 left   REVERSE SHOULDER ARTHROPLASTY Left 04/19/2017   Procedure: REVERSE SHOULDER ARTHROPLASTY;  Surgeon: Teigen Broom, MD;  Location: [facility];  Service: Orthopedics;  Laterality: Left;  REVERSE SHOULDER ARTHROPLASTY   ROTATOR CUFF REPAIR  2008   right, almost complete reconstruction, bone anchors   TONSILLECTOMY  age 36   TOTAL HIP ARTHROPLASTY Right 07/30/2014   Procedure: RIGHT TOTAL HIP ARTHROPLASTY ANTERIOR APPROACH;  Surgeon: Kathryne Hitch, MD;  Location: [facility];  Service: Orthopedics;  Laterality: Right;   TOTAL KNEE ARTHROPLASTY Right 04/07/2013   Procedure: RIGHT TOTAL KNEE ARTHROPLASTY;  Surgeon: Loanne Drilling, MD;  Location: [facility];  Service: Orthopedics;  Laterality: Right;   TOTAL KNEE ARTHROPLASTY Left 09/22/2013   Procedure: LEFT TOTAL KNEE ARTHROPLASTY;  Surgeon: Loanne Drilling, MD;  Location: [facility];  Service: Orthopedics;  Laterality: Left;    Allergies  Allergen Reactions   Oxycodone Hcl Shortness Of Breath and Other (See Comments)    Oxycontin-"turned purple" 04/19/17:  Per pt, can take Vicodin and Morphine.kph   Penicillins Anaphylaxis and Hives    Pt was a baby when this happened Has patient had a PCN reaction causing immediate rash, facial/tongue/throat swelling, SOB or lightheadedness with hypotension: Yes Has patient had a PCN reaction causing severe rash involving mucus membranes or skin necrosis: Unknown Has patient had a PCN reaction that required hospitalization: Unknown Has patient had a PCN reaction occurring within the last 10 years: No If all of the above answers are "NO", then may proceed with Cephalosporin use.   Lyrica [Pregabalin]     Pt states caused nightmares   Relafen [Nabumetone]  Nausea And Vomiting   Sulfa Antibiotics Other (See Comments)    fever     Physical Exam: General: The patient is alert and oriented x3 in no acute distress.  Dermatology: Skin is warm, dry and supple bilateral lower extremities. Negative for open lesions or macerations.  Vascular: Palpable pedal pulses bilaterally. No edema or erythema noted. Capillary refill within normal limits.  Neurological: Epicritic and protective threshold grossly intact bilaterally.   Musculoskeletal Exam: Pain on palpation along the lateral aspect of the left ankle.  Range of motion left ankle WNL.  Radiographic Exam LT foot:  Stable triple arthrodesis with compression staples in the subtalar screw.  Intact.  No appreciable change since last x-rays.  Assessment: 1. Achilles tendinitis left  Plan of Care:  1. Patient was evaluated. Radiographs were reviewed today. 2. Injection of 0.5 mL Celestone Soluspan injected into lateral aspect of the left posterior ankle 3.  Prescription for Medrol Dosepak, then continue Celebrex as prescribed 4.  Advised against going barefoot.  Recommend good supportive Brooks running shoes that the patient currently wears daily 5.  Return to clinic 4 weeks   Felecia Shelling, DPM [facility]  Dr. Felecia Shelling, DPM    [address] [place], [place] [address]                Office [phone]  Fax [phone]

## 2022-10-29 ENCOUNTER — Ambulatory Visit (HOSPITAL_BASED_OUTPATIENT_CLINIC_OR_DEPARTMENT_OTHER): Payer: Medicare Other | Attending: Cardiology | Admitting: Cardiology

## 2022-10-29 VITALS — Ht 67.0 in | Wt 305.0 lb

## 2022-10-29 DIAGNOSIS — G4736 Sleep related hypoventilation in conditions classified elsewhere: Secondary | ICD-10-CM | POA: Diagnosis not present

## 2022-10-29 DIAGNOSIS — G4733 Obstructive sleep apnea (adult) (pediatric): Secondary | ICD-10-CM | POA: Diagnosis not present

## 2022-10-29 DIAGNOSIS — G4734 Idiopathic sleep related nonobstructive alveolar hypoventilation: Secondary | ICD-10-CM

## 2022-11-02 NOTE — Procedures (Signed)
   Patient Name: Kelly Taylor, Kelly Taylor Date: 10/29/2022 Gender: Female D.O.B: 1951/10/12 Age (years): 65 Referring Provider: Armanda Magic MD, ABSM Height (inches): 67 Interpreting Physician: Armanda Magic MD, ABSM Weight (lbs): 305 RPSGT: Rolene Arbour BMI: 48 MRN: [id number] Neck Size: 17.00  CLINICAL INFORMATION The patient is referred for a CPAP titration to treat sleep apnea.  SLEEP STUDY TECHNIQUE As per the AASM Manual for the Scoring of Sleep and Associated Events v2.3 (April 2016) with a hypopnea requiring 4% desaturations.  The channels recorded and monitored were frontal, central and occipital EEG, electrooculogram (EOG), submentalis EMG (chin), nasal and oral airflow, thoracic and abdominal wall motion, anterior tibialis EMG, snore microphone, electrocardiogram, and pulse oximetry. Continuous positive airway pressure (CPAP) was initiated at the beginning of the study and titrated to treat sleep-disordered breathing.  MEDICATIONS Medications self-administered by patient taken the night of the study : N/A  TECHNICIAN COMMENTS Comments added by technician: Patient had difficulty initiating sleep. Comments added by scorer: N/A  RESPIRATORY PARAMETERS Optimal PAP Pressure (cm): 14  AHI at Optimal Pressure (/hr):0 Overall Minimal O2 (%):76.0  Supine % at Optimal Pressure (%):N/A Minimal O2 at Optimal Pressure (%): 87.0   SLEEP ARCHITECTURE The study was initiated at 11:18:34 PM and ended at 5:20:41 AM.  Sleep onset time was 45.4 minutes and the sleep efficiency was 77.1%. The total sleep time was 279.3 minutes.  The patient spent 0.9% of the night in stage N1 sleep, 69.4% in stage N2 sleep, 0.0% in stage N3 and 29.7% in REM.Stage REM latency was 72.5 minutes  Wake after sleep onset was 37.5. Alpha intrusion was absent. Supine sleep was 37.78%.  CARDIAC DATA The 2 lead EKG demonstrated sinus rhythm. The mean heart rate was 70.3 beats per minute. Other EKG findings  include: PVCs.  LEG MOVEMENT DATA The total Periodic Limb Movements of Sleep (PLMS) were 0. The PLMS index was 0.0. A PLMS index of <15 is considered normal in adults.  IMPRESSIONS - An optimal PAP pressure could not be selected for this patient based on the available study data. - Central sleep apnea was not noted during this titration (CAI = 1.7/h). - Severe oxygen desaturations were observed during this titration (min O2 = 76.0%). - The patient snored with moderate snoring volume during this titration study. - PVCs were observed during this study. - Clinically significant periodic limb movements were not noted during this study. Arousals associated with PLMs were significant.  DIAGNOSIS - Obstructive Sleep Apnea (G47.33) - Nocturnal Hypoxemia (G47.36)  RECOMMENDATIONS - Recommend a trial of ResMed CPAP at 14 cm H2O with heated humidity and mask of choice with 1L O2 via CPAP - Avoid alcohol, sedatives and other CNS depressants that may worsen sleep apnea and disrupt normal sleep architecture. - Sleep hygiene should be reviewed to assess factors that may improve sleep quality. - Weight management and regular exercise should be initiated or continued. - Return to Sleep Center for re-evaluation after 6 weeks of therapy - Recommend overnight pulse ox on CPAP to make sure no residual hypoxemia.   [Electronically signed] 11/02/2022 08:43 AM  Armanda Magic MD, ABSM Diplomate, American Board of Sleep Medicine

## 2022-11-03 ENCOUNTER — Telehealth: Payer: Self-pay | Admitting: *Deleted

## 2022-11-03 DIAGNOSIS — G4733 Obstructive sleep apnea (adult) (pediatric): Secondary | ICD-10-CM

## 2022-11-03 NOTE — Telephone Encounter (Signed)
-----   Message from Gaynelle Cage, CMA sent at 11/02/2022  9:59 AM EST -----  ----- Message ----- From: Quintella Reichert, MD Sent: 11/02/2022   8:46 AM EST To: C[facility] Div Sleep Studies  Please let patient know that they had a successful PAP titration and let DME know that orders are in EPIC.  Please set up 6 week OV with me.   Needs ONO on CPAP and O2 at 1 L

## 2022-11-03 NOTE — Telephone Encounter (Signed)
The patient has been notified of the result and verbalized understanding.  All questions (if any) were answered. Latrelle Dodrill, CMA 11/03/2022 3:26 PM    Upon patient request DME selection is [facility]. Patient understands he will be contacted by [facility] to set up his cpap. Patient understands to call if [facility] does not contact him with new setup in a timely manner. Patient understands they will be called once confirmation has been received from [facility]/ that they have received their new machine to schedule 10 week follow up appointment.   [facility] notified of new cpap order  Please add to airview Patient was grateful for the call and thanked me.

## 2022-11-06 ENCOUNTER — Ambulatory Visit: Payer: Medicare Other | Admitting: Podiatry

## 2022-11-09 DIAGNOSIS — M47816 Spondylosis without myelopathy or radiculopathy, lumbar region: Secondary | ICD-10-CM | POA: Diagnosis not present

## 2022-11-09 DIAGNOSIS — M461 Sacroiliitis, not elsewhere classified: Secondary | ICD-10-CM | POA: Diagnosis not present

## 2022-11-27 ENCOUNTER — Ambulatory Visit (INDEPENDENT_AMBULATORY_CARE_PROVIDER_SITE_OTHER): Payer: Medicare Other | Admitting: Podiatry

## 2022-11-27 ENCOUNTER — Ambulatory Visit: Payer: Medicare Other | Admitting: *Deleted

## 2022-11-27 DIAGNOSIS — M7661 Achilles tendinitis, right leg: Secondary | ICD-10-CM

## 2022-11-27 DIAGNOSIS — M7662 Achilles tendinitis, left leg: Secondary | ICD-10-CM | POA: Diagnosis not present

## 2022-11-27 NOTE — Progress Notes (Signed)
Chief Complaint  Patient presents with   Foot Pain    HPI: 72 y.o. female presenting today for new complaint of pain and tenderness associated to left posterior heel.  Patient was last seen in the office 2022.  Patient continues to have some pain and tenderness to the left foot.  She does have a history of triple arthrodesis left.  She says that currently the pain is 6/10.  She takes Celebrex daily.  Past Medical History:  Diagnosis Date   Anemia    hx of in 20's   Aortic atherosclerosis (HCC)    Arthritis    Ascending aorta dilatation (HCC)    81mm by CT 10/23   Asthma    Back pain    Breast mass, left 2003   Chronic back pain    seeing Pain specialist   Complication of anesthesia    "shallow breathing with general"   Constipation    DDD (degenerative disc disease)    Depression    Diabetes mellitus    borderline   DJD (degenerative joint disease)    Family history of heart attack    GERD (gastroesophageal reflux disease)    H/O scarlet fever    Hemorrhoids    History of chicken pox    History of measles, mumps, or rubella as child   all 3   Hx of bladder infections    Hypothyroidism    Irritable bowel syndrome    Joint pain    Lumbar stenosis    RFA done May 2015   Menopausal symptoms 2003   Obesity, morbid (HCC) 2006   Pericarditis    hx of   Pneumonia    hx of 5 years ago   Polyp of colon    removed during colonoscopy   SOBOE (shortness of breath on exertion)    Spinal headache    x1   Thyroid condition    Trichomonas    Urge incontinence 2005   Vertigo    over a year was last episode   Yeast in stool     Past Surgical History:  Procedure Laterality Date   ABDOMINAL HYSTERECTOMY  02/1990   APPENDECTOMY  age 77   BLADDER SUSPENSION     BREAST LUMPECTOMY Left 2003   BREAST REDUCTION SURGERY  1992   CHOLECYSTECTOMY  2010   COLONOSCOPY  may 2015   4 polyps-benign   DILATION AND CURETTAGE OF UTERUS     FOOT SURGERY Right    pinched nerve    HAND SURGERY Right    right-Dr. Amanda Pea   HERNIA REPAIR Right 1992   INTERSTIM IMPLANT PLACEMENT Right 2003   for incontinence   KNEE SURGERY     bilateral knee; x3 right/ x2 left   REVERSE SHOULDER ARTHROPLASTY Left 04/19/2017   Procedure: REVERSE SHOULDER ARTHROPLASTY;  Surgeon: Seth Broom, MD;  Location: [facility];  Service: Orthopedics;  Laterality: Left;  REVERSE SHOULDER ARTHROPLASTY   ROTATOR CUFF REPAIR  2008   right, almost complete reconstruction, bone anchors   TONSILLECTOMY  age 8   TOTAL HIP ARTHROPLASTY Right 07/30/2014   Procedure: RIGHT TOTAL HIP ARTHROPLASTY ANTERIOR APPROACH;  Surgeon: Kathryne Hitch, MD;  Location: [facility];  Service: Orthopedics;  Laterality: Right;   TOTAL KNEE ARTHROPLASTY Right 04/07/2013   Procedure: RIGHT TOTAL KNEE ARTHROPLASTY;  Surgeon: Loanne Drilling, MD;  Location: [facility];  Service: Orthopedics;  Laterality: Right;   TOTAL KNEE ARTHROPLASTY Left 09/22/2013   Procedure: LEFT TOTAL KNEE  ARTHROPLASTY;  Surgeon: Gearlean Alf, MD;  Location: [facility];  Service: Orthopedics;  Laterality: Left;    Allergies  Allergen Reactions   Oxycodone Hcl Shortness Of Breath and Other (See Comments)    Oxycontin-"turned purple" 04/19/17:  Per pt, can take Vicodin and Morphine.kph   Penicillins Anaphylaxis and Hives    Pt was a baby when this happened Has patient had a PCN reaction causing immediate rash, facial/tongue/throat swelling, SOB or lightheadedness with hypotension: Yes Has patient had a PCN reaction causing severe rash involving mucus membranes or skin necrosis: Unknown Has patient had a PCN reaction that required hospitalization: Unknown Has patient had a PCN reaction occurring within the last 10 years: No If all of the above answers are "NO", then may proceed with Cephalosporin use.   Lyrica [Pregabalin]     Pt states caused nightmares   Relafen [Nabumetone] Nausea And Vomiting   Sulfa Antibiotics Other (See Comments)    fever      Physical Exam: General: The patient is alert and oriented x3 in no acute distress.  Dermatology: Skin is warm, dry and supple bilateral lower extremities. Negative for open lesions or macerations.  Vascular: Palpable pedal pulses bilaterally. No edema or erythema noted. Capillary refill within normal limits.  Neurological: Epicritic and protective threshold grossly intact bilaterally.   Musculoskeletal Exam: Pain on palpation along the lateral aspect of the left ankle.  Range of motion left ankle WNL.  Radiographic Exam LT foot 10/23/2022:  Stable triple arthrodesis with compression staples in the subtalar screw.  Intact.  No appreciable change since last x-rays.  Assessment: 1. Achilles tendinitis left 2.  Generalized foot pain left  Plan of Care:  1. Patient was evaluated. Radiographs were reviewed today. 2.  Appointment with orthotics department for custom molded orthotics.  Potentially this will help support the medial longitudinal arch of the foot and ultimately alleviate some foot pain 3.  In the meantime continue OTC arch supports and good running shoes 4.  Return to clinic for orthotics fitting    Edrick Kins, DPM [facility]  Dr. Edrick Kins, DPM    [address], [place] [address]                Office [phone]  Fax [phone]

## 2022-11-27 NOTE — Progress Notes (Unsigned)
Patient presents today to be casted for custom molded orthotics. Dr. Amalia Hailey is the treating physician.  Impression foam cast was taken. ABN signed.  Patient info-  Shoe size: 11 WIDE  Shoe style: ATHLETIC  Height: 5FT 8IN  Weight: 295 LBS  Insurance: MEDICARE   Patient will be notified once orthotics arrive in office and reappoint for fitting at that time.

## 2022-11-29 ENCOUNTER — Encounter: Payer: Self-pay | Admitting: Pulmonary Disease

## 2022-11-29 ENCOUNTER — Ambulatory Visit (INDEPENDENT_AMBULATORY_CARE_PROVIDER_SITE_OTHER): Payer: Medicare Other | Admitting: Pulmonary Disease

## 2022-11-29 VITALS — BP 116/74 | HR 110 | Ht 67.0 in | Wt 303.0 lb

## 2022-11-29 DIAGNOSIS — R0609 Other forms of dyspnea: Secondary | ICD-10-CM | POA: Diagnosis not present

## 2022-11-29 LAB — PULMONARY FUNCTION TEST
DL/VA % pred: 127 %
DL/VA: 5.19 ml/min/mmHg/L
DLCO cor % pred: 119 %
DLCO cor: 25.68 ml/min/mmHg
DLCO unc % pred: 119 %
DLCO unc: 25.68 ml/min/mmHg
FEF 25-75 Post: 1.05 L/sec
FEF 25-75 Pre: 1.19 L/sec
FEF2575-%Change-Post: -11 %
FEF2575-%Pred-Post: 49 %
FEF2575-%Pred-Pre: 56 %
FEV1-%Change-Post: -2 %
FEV1-%Pred-Post: 79 %
FEV1-%Pred-Pre: 80 %
FEV1-Post: 2.05 L
FEV1-Pre: 2.09 L
FEV1FVC-%Change-Post: 6 %
FEV1FVC-%Pred-Pre: 90 %
FEV6-%Change-Post: -7 %
FEV6-%Pred-Post: 86 %
FEV6-%Pred-Pre: 92 %
FEV6-Post: 2.81 L
FEV6-Pre: 3.03 L
FEV6FVC-%Change-Post: 0 %
FEV6FVC-%Pred-Post: 104 %
FEV6FVC-%Pred-Pre: 103 %
FVC-%Change-Post: -7 %
FVC-%Pred-Post: 82 %
FVC-%Pred-Pre: 89 %
FVC-Post: 2.81 L
FVC-Pre: 3.05 L
Post FEV1/FVC ratio: 73 %
Post FEV6/FVC ratio: 100 %
Pre FEV1/FVC ratio: 69 %
Pre FEV6/FVC Ratio: 99 %
RV % pred: 107 %
RV: 2.5 L
TLC % pred: 99 %
TLC: 5.47 L

## 2022-11-29 NOTE — Progress Notes (Signed)
Kelly Taylor    [id number]    04-Jan-1951  Primary Care Physician:Collins, Annabelle Harman, DO  Referring Physician: Irena Reichmann, DO 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   calcium carbonate (OS-CAL) 600 MG TABS tablet Take 1,200 mg by mouth daily with breakfast.    celecoxib (CELEBREX) 200 MG capsule    Cyanocobalamin (VITAMIN B-12) 5000 MCG SUBL Place 5,000 mcg under the tongue daily.   cyclobenzaprine (FLEXERIL) 10 MG tablet Take 10 mg by mouth 3 (three) times daily.   diclofenac sodium (VOLTAREN) 1 % GEL diclofenac 1 % topical gel   docusate sodium (COLACE) 100 MG capsule Take 1 capsule (100 mg total) by mouth 3 (three) times daily as needed.   fluticasone (FLONASE) 50 MCG/ACT nasal spray fluticasone 50 mcg/actuation nasal spray,suspension   gabapentin (NEURONTIN) 100 MG capsule Take 100 mg by mouth 2 (two) times daily as needed (pain).   levothyroxine (SYNTHROID) 75 MCG tablet Take 75 mcg by mouth daily before breakfast.   meclizine (ANTIVERT) 25 MG tablet Take 25 mg by mouth 3 (three) times daily as  needed for dizziness.   metFORMIN (GLUCOPHAGE-XR) 500 MG 24 hr tablet TAKE 1 TABLET BY MOUTH WITH EVENING MEAL EVERY DAY   methocarbamol (ROBAXIN) 500 MG tablet Take 1 tablet (500 mg total) by mouth 2 (two) times daily.   Multiple Vitamins-Minerals (MULTIVITAMIN WITH MINERALS) tablet Take 1 tablet by mouth daily.   omeprazole (PRILOSEC) 20 MG capsule Take 20 mg by mouth daily.   polyethylene glycol (MIRALAX / GLYCOLAX) packet Take 17 g by mouth daily as needed for mild constipation (for constipation).    Probiotic Product (PROBIOTIC DAILY PO) Take 1 capsule by mouth daily.   simvastatin (ZOCOR) 10 MG tablet TAKE 1 TABLET BY MOUTH IN THE EVENING EVERY DAY   traMADol (ULTRAM) 50 MG tablet Take 100 mg by mouth every 6 (six) hours as needed for moderate pain.   Vitamin D, Ergocalciferol, (DRISDOL) 1.25 MG (50000 UNIT) CAPS capsule Take 1 capsule (50,000 Units total) by mouth every 7 (seven) days.   bacitracin ointment Apply 1 Application topically 2 (two) times daily. (Patient not taking: Reported on 11/29/2022)   benzonatate (TESSALON) 100 MG capsule Take 1 capsule (100 mg total) by mouth every 8 (eight) hours. (Patient not taking: Reported on 11/29/2022)   furosemide (LASIX) 40 MG tablet Take 40 mg by mouth daily. (Patient not taking: Reported on 11/29/2022)   guaiFENesin (ROBITUSSIN) 100 MG/5ML liquid Take 5-10 mLs (100-200 mg total) by mouth every 4 (four) hours as needed for cough or to loosen  phlegm. (Patient not taking: Reported on 11/29/2022)   methylPREDNISolone (MEDROL DOSEPAK) 4 MG TBPK tablet 6 day dose pack - take as directed (Patient not taking: Reported on 11/29/2022)   metoprolol tartrate (LOPRESSOR) 100 MG tablet Take 1 tablet (100 mg total) by mouth Once PRN for up to 1 dose (Take 1 tablet (100 mg) two hours prior to CT scan). (Patient not taking: Reported on 11/29/2022)   ondansetron (ZOFRAN-ODT) 4 MG disintegrating tablet Take 1 tablet (4 mg total) by mouth every 8 (eight) hours as needed  for nausea or vomiting. (Patient not taking: Reported on 11/29/2022)   tiZANidine (ZANAFLEX) 2 MG tablet tizanidine 2 mg tablet (Patient not taking: Reported on 11/29/2022)   No facility-administered encounter medications on file as of 11/29/2022.    Allergies as of 11/29/2022 - Review Complete 11/29/2022  Allergen Reaction Noted   Oxycodone hcl Shortness Of Breath and Other (See Comments) 09/16/2013   Penicillins Anaphylaxis and Hives 12/22/2011   Lyrica [pregabalin]  10/27/2019   Relafen [nabumetone] Nausea And Vomiting 09/16/2013   Sulfa antibiotics Other (See Comments) 12/22/2011    Past Medical History:  Diagnosis Date   Anemia    hx of in 20's   Aortic atherosclerosis (HCC)    Arthritis    Ascending aorta dilatation (HCC)    3mm by CT 10/23   Asthma    Back pain    Breast mass, left 2003   Chronic back pain    seeing Pain specialist   Complication of anesthesia    "shallow breathing with general"   Constipation    DDD (degenerative disc disease)    Depression    Diabetes mellitus    borderline   DJD (degenerative joint disease)    Family history of heart attack    GERD (gastroesophageal reflux disease)    H/O scarlet fever    Hemorrhoids    History of chicken pox    History of measles, mumps, or rubella as child   all 3   Hx of bladder infections    Hypothyroidism    Irritable bowel syndrome    Joint pain    Lumbar stenosis    RFA done May 2015   Menopausal symptoms 2003   Obesity, morbid ([place]) 2006   Pericarditis    hx of   Pneumonia    hx of 5 years ago   Polyp of colon    removed during colonoscopy   SOBOE (shortness of breath on exertion)    Spinal headache    x1   Thyroid condition    Trichomonas    Urge incontinence 2005   Vertigo    over a year was last episode   Yeast in stool     Past Surgical History:  Procedure Laterality Date   ABDOMINAL HYSTERECTOMY  02/1990   APPENDECTOMY  age 19   BLADDER SUSPENSION     BREAST LUMPECTOMY Left  2003   [place]  2010   COLONOSCOPY  may 2015   4 polyps-benign   DILATION AND CURETTAGE OF UTERUS     FOOT SURGERY Right    pinched nerve   HAND SURGERY Right    right-Dr. Knob Noster Right 1992   INTERSTIM IMPLANT PLACEMENT Right 2003   for incontinence   KNEE SURGERY     bilateral knee; x3 right/ x2 left   REVERSE SHOULDER ARTHROPLASTY Left 04/19/2017   Procedure: REVERSE SHOULDER ARTHROPLASTY;  Surgeon: Tania Ade,  MD;  Location: [facility];  Service: Orthopedics;  Laterality: Left;  REVERSE SHOULDER ARTHROPLASTY   ROTATOR CUFF REPAIR  2008   right, almost complete reconstruction, bone anchors   TONSILLECTOMY  age 27   TOTAL HIP ARTHROPLASTY Right 07/30/2014   Procedure: RIGHT TOTAL HIP ARTHROPLASTY ANTERIOR APPROACH;  Surgeon: Kathryne Hitch, MD;  Location: [facility];  Service: Orthopedics;  Laterality: Right;   TOTAL KNEE ARTHROPLASTY Right 04/07/2013   Procedure: RIGHT TOTAL KNEE ARTHROPLASTY;  Surgeon: Loanne Drilling, MD;  Location: [facility];  Service: Orthopedics;  Laterality: Right;   TOTAL KNEE ARTHROPLASTY Left 09/22/2013   Procedure: LEFT TOTAL KNEE ARTHROPLASTY;  Surgeon: Loanne Drilling, MD;  Location: [facility];  Service: Orthopedics;  Laterality: Left;    Family History  Problem Relation Age of Onset   Cancer Mother    Hypertension Mother    Diabetes Mother    Lung cancer Mother    Heart disease Mother    Obesity Mother    Depression Father    Stroke Paternal Grandfather    Ovarian cancer Maternal Aunt    Breast cancer Maternal Aunt     Social History   Socioeconomic History   Marital status: Widowed    Spouse name: Not on file   Number of children: Not on file   Years of education: Not on file   Highest education level: Not on file  Occupational History   Occupation: retired  Tobacco Use   Smoking status: Never   Smokeless tobacco: Never  Vaping Use   Vaping Use: Never used  Substance and  Sexual Activity   Alcohol use: Yes    Comment: 1 or 2 glasses a week of wine   Drug use: No   Sexual activity: Yes    Birth control/protection: Surgical    Comment: hyst  Other Topics Concern   Not on file  Social History Narrative   Not on file   Social Determinants of Health   Financial Resource Strain: Not on file  Food Insecurity: Not on file  Transportation Needs: Not on file  Physical Activity: Not on file  Stress: Not on file  Social Connections: Not on file  Intimate Partner Violence: Not on file    Review of Systems  Respiratory:  Positive for shortness of breath.   Musculoskeletal:  Positive for arthralgias and back pain.    Vitals:   11/29/22 1112  BP: 116/74  Pulse: (!) 110  SpO2: 93%     Physical Exam Constitutional:      Appearance: She is obese.  HENT:     Head: Normocephalic.     Mouth/Throat:     Mouth: Mucous membranes are moist.     Comments: Mallampati 3, crowded oropharynx Eyes:     General: No scleral icterus. Cardiovascular:     Rate and Rhythm: Normal rate and regular rhythm.     Heart sounds: No murmur heard.    No friction rub.  Pulmonary:     Effort: No respiratory distress.     Breath sounds: No stridor. No wheezing or rhonchi.  Musculoskeletal:     Cervical back: No rigidity or tenderness.  Neurological:     Mental Status: She is alert.  Psychiatric:        Mood and Affect: Mood normal.    Data Reviewed: PFT reviewed with the patient today showing mild obstructive disease with no significant bronchodilator response  Recent sleep study with severe obstructive airway disease, started on CPAP  therapy.  Followed by Dr. Radford Pax, cardiology  Assessment:  Shortness of breath on exertion  Mild obstructive airway disease -Noted on PFT -Likely related to secondhand smoke exposure  Class III obesity  Chronic back pain  Prediabetes  Severe obstructive sleep apnea on CPAP therapy  Plan/Recommendations:  Encouraged to  continue CPAP nightly  Continue graded activities as tolerated  Continue albuterol to be used as needed  Follow-up in 6 months  Sherrilyn Rist MD [facility] 11/29/2022, 11:24 AM  CC: Janie Morning, DO

## 2022-11-29 NOTE — Patient Instructions (Signed)
Full PFT performed today. °

## 2022-11-29 NOTE — Progress Notes (Signed)
Full PFT performed today. °

## 2022-11-29 NOTE — Patient Instructions (Signed)
Continue using albuterol as needed  I will see you in 6 months  Graded activities as tolerated  Call with significant concerns  Your breathing study today does show mild obstructive disease, likely related to exposure to cigarette smoke in the past-secondhand smoke

## 2022-12-11 DIAGNOSIS — N3941 Urge incontinence: Secondary | ICD-10-CM | POA: Diagnosis not present

## 2022-12-13 DIAGNOSIS — M461 Sacroiliitis, not elsewhere classified: Secondary | ICD-10-CM | POA: Diagnosis not present

## 2023-01-03 DIAGNOSIS — M791 Myalgia, unspecified site: Secondary | ICD-10-CM | POA: Diagnosis not present

## 2023-01-03 DIAGNOSIS — M47816 Spondylosis without myelopathy or radiculopathy, lumbar region: Secondary | ICD-10-CM | POA: Diagnosis not present

## 2023-01-03 DIAGNOSIS — M542 Cervicalgia: Secondary | ICD-10-CM | POA: Diagnosis not present

## 2023-01-03 DIAGNOSIS — M461 Sacroiliitis, not elsewhere classified: Secondary | ICD-10-CM | POA: Diagnosis not present

## 2023-01-08 ENCOUNTER — Other Ambulatory Visit: Payer: Medicare Other

## 2023-01-15 ENCOUNTER — Other Ambulatory Visit: Payer: Medicare Other

## 2023-02-05 DIAGNOSIS — E118 Type 2 diabetes mellitus with unspecified complications: Secondary | ICD-10-CM | POA: Diagnosis not present

## 2023-02-05 DIAGNOSIS — M5126 Other intervertebral disc displacement, lumbar region: Secondary | ICD-10-CM | POA: Diagnosis not present

## 2023-02-05 DIAGNOSIS — E559 Vitamin D deficiency, unspecified: Secondary | ICD-10-CM | POA: Diagnosis not present

## 2023-02-05 DIAGNOSIS — Z9109 Other allergy status, other than to drugs and biological substances: Secondary | ICD-10-CM | POA: Diagnosis not present

## 2023-02-05 DIAGNOSIS — E039 Hypothyroidism, unspecified: Secondary | ICD-10-CM | POA: Diagnosis not present

## 2023-02-05 DIAGNOSIS — E785 Hyperlipidemia, unspecified: Secondary | ICD-10-CM | POA: Diagnosis not present

## 2023-02-05 DIAGNOSIS — K219 Gastro-esophageal reflux disease without esophagitis: Secondary | ICD-10-CM | POA: Diagnosis not present

## 2023-02-12 ENCOUNTER — Other Ambulatory Visit: Payer: Medicare Other

## 2023-02-12 DIAGNOSIS — F5102 Adjustment insomnia: Secondary | ICD-10-CM | POA: Diagnosis not present

## 2023-02-12 DIAGNOSIS — M5126 Other intervertebral disc displacement, lumbar region: Secondary | ICD-10-CM | POA: Diagnosis not present

## 2023-02-12 DIAGNOSIS — K219 Gastro-esophageal reflux disease without esophagitis: Secondary | ICD-10-CM | POA: Diagnosis not present

## 2023-02-12 DIAGNOSIS — E785 Hyperlipidemia, unspecified: Secondary | ICD-10-CM | POA: Diagnosis not present

## 2023-02-12 DIAGNOSIS — Z0000 Encounter for general adult medical examination without abnormal findings: Secondary | ICD-10-CM | POA: Diagnosis not present

## 2023-02-12 DIAGNOSIS — E118 Type 2 diabetes mellitus with unspecified complications: Secondary | ICD-10-CM | POA: Diagnosis not present

## 2023-02-12 DIAGNOSIS — E039 Hypothyroidism, unspecified: Secondary | ICD-10-CM | POA: Diagnosis not present

## 2023-03-02 ENCOUNTER — Telehealth: Payer: Self-pay | Admitting: Orthopaedic Surgery

## 2023-03-02 NOTE — Telephone Encounter (Signed)
I talked to pt and advised her. She stated understanding

## 2023-03-02 NOTE — Telephone Encounter (Signed)
Patient called. She would like to know if her PT could use traction on her operated leg? Surgery was 2020

## 2023-03-02 NOTE — Telephone Encounter (Signed)
Lvm for pt to cb to discuss  

## 2023-03-20 DIAGNOSIS — N3941 Urge incontinence: Secondary | ICD-10-CM | POA: Diagnosis not present

## 2023-03-29 DIAGNOSIS — M47816 Spondylosis without myelopathy or radiculopathy, lumbar region: Secondary | ICD-10-CM | POA: Diagnosis not present

## 2023-03-29 DIAGNOSIS — Z79899 Other long term (current) drug therapy: Secondary | ICD-10-CM | POA: Diagnosis not present

## 2023-03-29 DIAGNOSIS — M461 Sacroiliitis, not elsewhere classified: Secondary | ICD-10-CM | POA: Diagnosis not present

## 2023-03-29 DIAGNOSIS — M791 Myalgia, unspecified site: Secondary | ICD-10-CM | POA: Diagnosis not present

## 2023-04-03 DIAGNOSIS — M19011 Primary osteoarthritis, right shoulder: Secondary | ICD-10-CM | POA: Diagnosis not present

## 2023-04-03 DIAGNOSIS — M25511 Pain in right shoulder: Secondary | ICD-10-CM | POA: Diagnosis not present

## 2023-04-04 ENCOUNTER — Other Ambulatory Visit: Payer: Self-pay | Admitting: Student in an Organized Health Care Education/Training Program

## 2023-04-04 DIAGNOSIS — M19011 Primary osteoarthritis, right shoulder: Secondary | ICD-10-CM

## 2023-04-25 DIAGNOSIS — M47816 Spondylosis without myelopathy or radiculopathy, lumbar region: Secondary | ICD-10-CM | POA: Diagnosis not present

## 2023-04-25 DIAGNOSIS — M5417 Radiculopathy, lumbosacral region: Secondary | ICD-10-CM | POA: Diagnosis not present

## 2023-05-09 ENCOUNTER — Ambulatory Visit
Admission: RE | Admit: 2023-05-09 | Discharge: 2023-05-09 | Disposition: A | Discharge status: Home / Self Care | Payer: Medicare Other | Source: Ambulatory Visit | Attending: Student in an Organized Health Care Education/Training Program | Admitting: Student in an Organized Health Care Education/Training Program

## 2023-05-09 DIAGNOSIS — M75121 Complete rotator cuff tear or rupture of right shoulder, not specified as traumatic: Secondary | ICD-10-CM | POA: Diagnosis not present

## 2023-05-09 DIAGNOSIS — M19011 Primary osteoarthritis, right shoulder: Secondary | ICD-10-CM

## 2023-05-14 DIAGNOSIS — M75101 Unspecified rotator cuff tear or rupture of right shoulder, not specified as traumatic: Secondary | ICD-10-CM | POA: Diagnosis not present

## 2023-05-14 DIAGNOSIS — M19011 Primary osteoarthritis, right shoulder: Secondary | ICD-10-CM | POA: Diagnosis not present

## 2023-05-21 DIAGNOSIS — M5416 Radiculopathy, lumbar region: Secondary | ICD-10-CM | POA: Diagnosis not present

## 2023-05-23 ENCOUNTER — Telehealth: Payer: Self-pay | Admitting: Pulmonary Disease

## 2023-05-23 DIAGNOSIS — M19211 Secondary osteoarthritis, right shoulder: Secondary | ICD-10-CM | POA: Diagnosis not present

## 2023-05-23 NOTE — Telephone Encounter (Signed)
Pt.calling to let us know that [facility] will be send paperwork to get filled out but App and Dr. Val Eagle has nothing soon for apt

## 2023-05-30 NOTE — Telephone Encounter (Signed)
Fax received from Dr. Ave Filter with [facility] to perform a Rt Shoulder Replacement on patient.  Patient needs surgery clearance. Surgery is Pending. Patient was seen on 11/29/22. Office protocol is a risk assessment can be sent to surgeon if patient has been seen in 60 days or less.   Sending to [employer] for risk assessment or recommendations if patient needs to be seen in office prior to surgical procedure.    Katie I have patient scheduled to see you 7/29 at [facility]

## 2023-05-31 NOTE — Telephone Encounter (Signed)
[facility] called asking about the status of the surg. clearance spoke to [facility] an made aware and said to let them know she will have the ov on 7/29 for  risk assessment

## 2023-06-04 DIAGNOSIS — Z79899 Other long term (current) drug therapy: Secondary | ICD-10-CM | POA: Diagnosis not present

## 2023-06-04 DIAGNOSIS — Z01818 Encounter for other preprocedural examination: Secondary | ICD-10-CM | POA: Diagnosis not present

## 2023-06-04 DIAGNOSIS — B370 Candidal stomatitis: Secondary | ICD-10-CM | POA: Diagnosis not present

## 2023-06-04 DIAGNOSIS — E785 Hyperlipidemia, unspecified: Secondary | ICD-10-CM | POA: Diagnosis not present

## 2023-06-04 DIAGNOSIS — M5126 Other intervertebral disc displacement, lumbar region: Secondary | ICD-10-CM | POA: Diagnosis not present

## 2023-06-04 DIAGNOSIS — E118 Type 2 diabetes mellitus with unspecified complications: Secondary | ICD-10-CM | POA: Diagnosis not present

## 2023-06-04 DIAGNOSIS — K219 Gastro-esophageal reflux disease without esophagitis: Secondary | ICD-10-CM | POA: Diagnosis not present

## 2023-06-04 DIAGNOSIS — E039 Hypothyroidism, unspecified: Secondary | ICD-10-CM | POA: Diagnosis not present

## 2023-06-18 ENCOUNTER — Ambulatory Visit (INDEPENDENT_AMBULATORY_CARE_PROVIDER_SITE_OTHER): Payer: Medicare Other | Admitting: Nurse Practitioner

## 2023-06-18 ENCOUNTER — Ambulatory Visit (HOSPITAL_BASED_OUTPATIENT_CLINIC_OR_DEPARTMENT_OTHER): Payer: Medicare Other

## 2023-06-18 ENCOUNTER — Encounter (HOSPITAL_BASED_OUTPATIENT_CLINIC_OR_DEPARTMENT_OTHER): Payer: Self-pay | Admitting: Nurse Practitioner

## 2023-06-18 VITALS — BP 112/66 | HR 111 | Resp 17 | Ht 67.0 in | Wt 309.4 lb

## 2023-06-18 DIAGNOSIS — Z01811 Encounter for preprocedural respiratory examination: Secondary | ICD-10-CM | POA: Diagnosis not present

## 2023-06-18 DIAGNOSIS — I771 Stricture of artery: Secondary | ICD-10-CM | POA: Diagnosis not present

## 2023-06-18 DIAGNOSIS — J4520 Mild intermittent asthma, uncomplicated: Secondary | ICD-10-CM | POA: Diagnosis not present

## 2023-06-18 DIAGNOSIS — J449 Chronic obstructive pulmonary disease, unspecified: Secondary | ICD-10-CM

## 2023-06-18 DIAGNOSIS — Z96612 Presence of left artificial shoulder joint: Secondary | ICD-10-CM | POA: Diagnosis not present

## 2023-06-18 DIAGNOSIS — Z01818 Encounter for other preprocedural examination: Secondary | ICD-10-CM | POA: Diagnosis not present

## 2023-06-18 NOTE — Progress Notes (Signed)
@Patient  ID: Archie Endo, female    DOB: 08-29-1951, 72 y.o.   MRN: [id number]  Chief Complaint  Patient presents with   Medical Clearance    Referring provider: Irena Reichmann, DO  HPI: 72 year old female, never smoker followed for mild obstructive airway disease. She is a patient of Dr. Trena Platt and last seen in office 11/29/2022. Past medical history significant for GERD, IBS, DM, obesity, HLD, severe OSA on CPAP.  TEST/EVENTS:  11/29/2022 PFT: FVC 89, FEV1 80, ratio 73, TLC 99, DLCOcor 119. No BD  11/29/2022: OV with Dr. Wynona Neat. Severe OSA on CPAP - followed by Dr. Mayford Knife. Trying to get used to therapy. Has SOB with activity. Does use albuterol as needed. PFT with mild obstruction and no BD. Never smoker. 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 Encouraged to work on graded exercises. PRN SABA.  06/18/2023: Today - preoperative surgical evaluation Patient presents today for preoperative surgical evaluation prior to right shoulder replacement with Dr. Ave Filter. Surgery date TBD. She has felt well since she was here last. She feels like her breathing has been doing pretty well. She hasn't had to use her albuterol. She doesn't have any cough, chest congestion, wheezing. She has not had any exacerbations requiring steroids or abx. No hospitalizations. She wears her CPAP most nights but tends to wake up with it off. She's followed by Dr. Mayford Knife for OSA. She does feel like the CPAP helps.   Allergies  Allergen Reactions   Oxycodone Hcl Shortness Of Breath and Other (See Comments)    Oxycontin-"turned purple" 04/19/17:  Per pt, can take Vicodin and Morphine.kph   Penicillins Anaphylaxis and Hives    Pt was a baby when this happened Has patient had a PCN reaction causing immediate rash, facial/tongue/throat swelling, SOB or lightheadedness with hypotension: Yes Has patient had a PCN reaction causing severe rash involving mucus membranes or skin necrosis:  Unknown Has patient had a PCN reaction that required hospitalization: Unknown Has patient had a PCN reaction occurring within the last 10 years: No If all of the above answers are "NO", then may proceed with Cephalosporin use.   Lyrica [Pregabalin]     Pt states caused nightmares   Relafen [Nabumetone] Nausea And Vomiting   Sulfa Antibiotics Other (See Comments)    fever    Immunization History  Administered Date(s) Administered   Fluad Quad(high Dose 65+) 07/24/2022   PFIZER(Purple Top)SARS-COV-2 Vaccination 01/03/2020, 01/28/2020, 11/26/2020   Pneumococcal Polysaccharide-23 09/03/2019    Past Medical History:  Diagnosis Date   Anemia    hx of in 20's   Aortic atherosclerosis (HCC)    Arthritis    Ascending aorta dilatation (HCC)    42mm by CT 10/23   Asthma    Back pain    Breast mass, left 2003   Chronic back pain    seeing Pain specialist   Complication of anesthesia    "shallow breathing with general"   Constipation    DDD (degenerative disc disease)    Depression    Diabetes mellitus    borderline   DJD (degenerative joint disease)    Family history of heart attack    GERD (gastroesophageal reflux disease)    H/O scarlet fever    Hemorrhoids    History of chicken pox    History of measles, mumps, or rubella as child   all 3   Hx of bladder infections    Hypothyroidism  Irritable bowel syndrome    Joint pain    Lumbar stenosis    RFA done May 2015   Menopausal symptoms 2003   Obesity, morbid (HCC) 2006   Pericarditis    hx of   Pneumonia    hx of 5 years ago   Polyp of colon    removed during colonoscopy   SOBOE (shortness of breath on exertion)    Spinal headache    x1   Thyroid condition    Trichomonas    Urge incontinence 2005   Vertigo    over a year was last episode   Yeast in stool     Tobacco History: Social History   Tobacco Use  Smoking Status Never  Smokeless Tobacco Never   Counseling given: Not Answered   Outpatient  Medications Prior to Visit  Medication Sig Dispense Refill   albuterol (PROVENTIL HFA;VENTOLIN HFA) 108 (90 BASE) MCG/ACT inhaler Inhale 2 puffs into the lungs every 4 (four) hours as needed for wheezing or shortness of breath. 1 Inhaler 0   calcium carbonate (OS-CAL) 600 MG TABS tablet Take 1,200 mg by mouth daily with breakfast.      Cyanocobalamin (VITAMIN B-12) 5000 MCG SUBL Place 5,000 mcg under the tongue daily.     cyclobenzaprine (FLEXERIL) 10 MG tablet Take 10 mg by mouth 3 (three) times daily.     diclofenac sodium (VOLTAREN) 1 % GEL diclofenac 1 % topical gel     docusate sodium (COLACE) 100 MG capsule Take 1 capsule (100 mg total) by mouth 3 (three) times daily as needed. 20 capsule 0   fluticasone (FLONASE) 50 MCG/ACT nasal spray fluticasone 50 mcg/actuation nasal spray,suspension     furosemide (LASIX) 40 MG tablet Take 40 mg by mouth daily.     gabapentin (NEURONTIN) 100 MG capsule Take 100 mg by mouth 2 (two) times daily as needed (pain).     levothyroxine (SYNTHROID) 75 MCG tablet Take 75 mcg by mouth daily before breakfast.     meclizine (ANTIVERT) 25 MG tablet Take 25 mg by mouth 3 (three) times daily as needed for dizziness.     metFORMIN (GLUCOPHAGE-XR) 500 MG 24 hr tablet TAKE 1 TABLET BY MOUTH WITH EVENING MEAL EVERY DAY  1   methocarbamol (ROBAXIN) 500 MG tablet Take 1 tablet (500 mg total) by mouth 2 (two) times daily. 1 tablet 30   Multiple Vitamins-Minerals (MULTIVITAMIN WITH MINERALS) tablet Take 1 tablet by mouth daily.     omeprazole (PRILOSEC) 20 MG capsule Take 20 mg by mouth daily.  1   polyethylene glycol (MIRALAX / GLYCOLAX) packet Take 17 g by mouth daily as needed for mild constipation (for constipation).      Probiotic Product (PROBIOTIC DAILY PO) Take 1 capsule by mouth daily.     simvastatin (ZOCOR) 10 MG tablet TAKE 1 TABLET BY MOUTH IN THE EVENING EVERY DAY  1   tiZANidine (ZANAFLEX) 2 MG tablet      traMADol (ULTRAM) 50 MG tablet Take 100 mg by mouth  every 6 (six) hours as needed for moderate pain.     bacitracin ointment Apply 1 Application topically 2 (two) times daily. (Patient not taking: Reported on 11/29/2022) 120 g 0   benzonatate (TESSALON) 100 MG capsule Take 1 capsule (100 mg total) by mouth every 8 (eight) hours. (Patient not taking: Reported on 11/29/2022) 21 capsule 0   celecoxib (CELEBREX) 200 MG capsule      guaiFENesin (ROBITUSSIN) 100 MG/5ML liquid Take 5-10 mLs (  100-200 mg total) by mouth every 4 (four) hours as needed for cough or to loosen phlegm. (Patient not taking: Reported on 11/29/2022) 60 mL 0   methylPREDNISolone (MEDROL DOSEPAK) 4 MG TBPK tablet 6 day dose pack - take as directed (Patient not taking: Reported on 11/29/2022) 21 tablet 0   metoprolol tartrate (LOPRESSOR) 100 MG tablet Take 1 tablet (100 mg total) by mouth Once PRN for up to 1 dose (Take 1 tablet (100 mg) two hours prior to CT scan). (Patient not taking: Reported on 11/29/2022) 1 tablet 0   ondansetron (ZOFRAN-ODT) 4 MG disintegrating tablet Take 1 tablet (4 mg total) by mouth every 8 (eight) hours as needed for nausea or vomiting. (Patient not taking: Reported on 11/29/2022) 10 tablet 0   Vitamin D, Ergocalciferol, (DRISDOL) 1.25 MG (50000 UNIT) CAPS capsule Take 1 capsule (50,000 Units total) by mouth every 7 (seven) days. 5 capsule 0   No facility-administered medications prior to visit.     Review of Systems:   Constitutional: No weight loss or gain, night sweats, fevers, chills, or lassitude. +occasional fatigue  HEENT: No headaches, difficulty swallowing, tooth/dental problems, or sore throat. No sneezing, itching, ear ache, nasal congestion, or post nasal drip CV:  No chest pain, orthopnea, PND, swelling in lower extremities, anasarca, dizziness, palpitations, syncope Resp: +minimal shortness of breath with exertion. No excess mucus or change in color of mucus. No productive or non-productive. No hemoptysis. No wheezing.  No chest wall deformity GI:   No heartburn, indigestion Skin: No rash, lesions, ulcerations MSK:  +right shoulder pain. No joint swelling.   Neuro: No dizziness or lightheadedness.  Psych: No depression or anxiety. Mood stable.     Physical Exam:  BP 112/66   Pulse (!) 111   Resp 17   Ht 5\' 7"  (1.702 m)   Wt (!) 309 lb 6.4 oz (140.3 kg)   SpO2 96%   BMI 48.46 kg/m   GEN: Pleasant, interactive, well-kempt; morbidly obese; in no acute distress. HEENT:  Normocephalic and atraumatic. PERRLA. Sclera white. Nasal turbinates pink, moist and patent bilaterally. No rhinorrhea present. Oropharynx pink and moist, without exudate or edema. No lesions, ulcerations, or postnasal drip.  NECK:  Supple w/ fair ROM. No JVD present. Normal carotid impulses w/o bruits. Thyroid symmetrical with no goiter or nodules palpated. No lymphadenopathy.   CV: RRR, no m/r/g, no peripheral edema. Pulses intact, +2 bilaterally. No cyanosis, pallor or clubbing. PULMONARY:  Unlabored, regular breathing. Clear bilaterally A&P w/o wheezes/rales/rhonchi. No accessory muscle use.  GI: BS present and normoactive. Soft, non-tender to palpation. No organomegaly or masses detected.  MSK: No erythema, warmth or tenderness. Cap refil <2 sec all extrem. No deformities or joint swelling noted.  Neuro: A/Ox3. No focal deficits noted.   Skin: Warm, no lesions or rashe Psych: Normal affect and behavior. Judgement and thought content appropriate.     Lab Results:  CBC    Component Value Date/Time   WBC 8.1 09/29/2022 1200   RBC 4.89 09/29/2022 1200   HGB 13.6 09/29/2022 1200   HGB 13.8 07/18/2022 1232   HCT 43.3 09/29/2022 1200   HCT 43.0 07/18/2022 1232   PLT 220 09/29/2022 1200   PLT 271 07/18/2022 1232   MCV 88.5 09/29/2022 1200   MCV 87 07/18/2022 1232   MCH 27.8 09/29/2022 1200   MCHC 31.4 09/29/2022 1200   RDW 14.4 09/29/2022 1200   RDW 14.5 07/18/2022 1232   LYMPHSABS 0.9 09/29/2022 1200   LYMPHSABS 2.2  07/18/2022 1232   MONOABS 0.7  09/29/2022 1200   EOSABS 0.0 09/29/2022 1200   EOSABS 0.2 07/18/2022 1232   BASOSABS 0.0 09/29/2022 1200   BASOSABS 0.1 07/18/2022 1232    BMET    Component Value Date/Time   NA 139 09/29/2022 1200   NA 141 08/30/2022 1204   K 3.6 09/29/2022 1200   CL 101 09/29/2022 1200   CO2 24 09/29/2022 1200   GLUCOSE 174 (H) 09/29/2022 1200   BUN 10 09/29/2022 1200   BUN 11 08/30/2022 1204   CREATININE 0.85 09/29/2022 1200   CALCIUM 9.0 09/29/2022 1200   GFRNONAA >60 09/29/2022 1200   GFRAA >60 04/20/2017 0719    BNP No results found for: "BNP"   Imaging:  No results found.  Administration History     None          Latest Ref Rng & Units 11/29/2022   10:06 AM  PFT Results  FVC-Pre L 3.05   FVC-Predicted Pre % 89   FVC-Post L 2.81   FVC-Predicted Post % 82   Pre FEV1/FVC % % 69   Post FEV1/FCV % % 73   FEV1-Pre L 2.09   FEV1-Predicted Pre % 80   FEV1-Post L 2.05   DLCO uncorrected ml/min/mmHg 25.68   DLCO UNC% % 119   DLCO corrected ml/min/mmHg 25.68   DLCO COR %Predicted % 119   DLVA Predicted % 127   TLC L 5.47   TLC % Predicted % 99   RV % Predicted % 107     No results found for: "NITRICOXIDE"      Assessment & Plan:   Asthma Mild intermittent. She has not had any exacerbations requiring steroids/abx. Compensated with PRN SABA, which she rarely uses. Action plan in place.  Patient Instructions  Continue Albuterol inhaler 2 puffs every 6 hours as needed for shortness of breath or wheezing. Notify if symptoms persist despite rescue inhaler/neb use.  Take your rescue inhaler with you to surgery. Use incentive spirometer ten times an hour while awake during recovery period. Up out of bed as soon as possible.   Chest x ray today  Follow up with Dr. Mayford Knife regarding your CPAP  Follow up in one year with Dr. Wynona Neat. If symptoms worsen, please contact office for sooner follow up or seek emergency care.    Preoperative respiratory examination Low to  moderate risk. Factors that increase the risk for postoperative pulmonary complications are mild asthma, obesity, severe OSA, age  Respiratory complications generally occur in 1% of ASA Class I patients, 5% of ASA Class II and 10% of ASA Class III-IV patients These complications rarely result in mortality and include postoperative pneumonia, atelectasis, pulmonary embolism, ARDS and increased time requiring postoperative mechanical ventilation.   Overall, I recommend proceeding with the surgery if the risk for respiratory complications are outweighed by the potential benefits. This will need to be discussed between the patient and surgeon.   To reduce risks of respiratory complications, I recommend: --Pre- and post-operative incentive spirometry performed frequently while awake --Inpatient use of currently prescribed positive-pressure for OSA whenever the patient is sleeping --Short duration of surgery as much as possible and avoid paralytic if possible --OOB, encourage mobility post-op   1) RISK FOR PROLONGED MECHANICAL VENTILAION - > 48h  1A) Arozullah - Prolonged mech ventilation risk Arozullah Postperative Pulmonary Risk Score - for mech ventilation dependence >48h USAA, Ann Surg 2000, major non-cardiac surgery) Comment Score  Type of surgery - abd ao  aneurysm (27), thoracic (21), neurosurgery / upper abdominal / vascular (21), neck (11) Right shoulder replacement 5  Emergency Surgery - (11)  0  ALbumin < 3 or poor nutritional state - (9) Obesity  5  BUN > 30 -  (8)  0  Partial or completely dependent functional status - (7)  0  COPD -  (6) Mild asthma 3  Age - 22 to 66 (4), > 70  (6) 69 4  TOTAL  17  Risk Stratifcation scores  - < 10 (0.5%), 11-19 (1.8%), 20-27 (4.2%), 28-40 (10.1%), >40 (26.6%)  1.8%     I spent 31 minutes of dedicated to the care of this patient on the date of this encounter to include pre-visit review of records, face-to-face time with the patient  discussing conditions above, post visit ordering of testing, clinical documentation with the electronic health record, making appropriate referrals as documented, and communicating necessary findings to members of the patients care team.  Noemi Chapel, NP 06/18/2023  Pt aware and understands NP's role.

## 2023-06-18 NOTE — Telephone Encounter (Signed)
OV notes and clearance form have been faxed back to [facility]. Nothing further needed at this time.  

## 2023-06-18 NOTE — Assessment & Plan Note (Signed)
Mild intermittent. She has not had any exacerbations requiring steroids/abx. Compensated with PRN SABA, which she rarely uses. Action plan in place.  Patient Instructions  Continue Albuterol inhaler 2 puffs every 6 hours as needed for shortness of breath or wheezing. Notify if symptoms persist despite rescue inhaler/neb use.  Take your rescue inhaler with you to surgery. Use incentive spirometer ten times an hour while awake during recovery period. Up out of bed as soon as possible.   Chest x ray today  Follow up with Dr. Mayford Knife regarding your CPAP  Follow up in one year with Dr. Wynona Neat. If symptoms worsen, please contact office for sooner follow up or seek emergency care.

## 2023-06-18 NOTE — Assessment & Plan Note (Signed)
Low to moderate risk. Factors that increase the risk for postoperative pulmonary complications are mild asthma, obesity, severe OSA, age  Respiratory complications generally occur in 1% of ASA Class I patients, 5% of ASA Class II and 10% of ASA Class III-IV patients These complications rarely result in mortality and include postoperative pneumonia, atelectasis, pulmonary embolism, ARDS and increased time requiring postoperative mechanical ventilation.   Overall, I recommend proceeding with the surgery if the risk for respiratory complications are outweighed by the potential benefits. This will need to be discussed between the patient and surgeon.   To reduce risks of respiratory complications, I recommend: --Pre- and post-operative incentive spirometry performed frequently while awake --Inpatient use of currently prescribed positive-pressure for OSA whenever the patient is sleeping --Short duration of surgery as much as possible and avoid paralytic if possible --OOB, encourage mobility post-op   1) RISK FOR PROLONGED MECHANICAL VENTILAION - > 48h  1A) Arozullah - Prolonged mech ventilation risk Arozullah Postperative Pulmonary Risk Score - for mech ventilation dependence >48h USAA, Ann Surg 2000, major non-cardiac surgery) Comment Score  Type of surgery - abd ao aneurysm (27), thoracic (21), neurosurgery / upper abdominal / vascular (21), neck (11) Right shoulder replacement 5  Emergency Surgery - (11)  0  ALbumin < 3 or poor nutritional state - (9) Obesity  5  BUN > 30 -  (8)  0  Partial or completely dependent functional status - (7)  0  COPD -  (6) Mild asthma 3  Age - 60 to 69 (4), > 70  (6) 69 4  TOTAL  17  Risk Stratifcation scores  - < 10 (0.5%), 11-19 (1.8%), 20-27 (4.2%), 28-40 (10.1%), >40 (26.6%)  1.8%

## 2023-06-18 NOTE — Patient Instructions (Addendum)
Continue Albuterol inhaler 2 puffs every 6 hours as needed for shortness of breath or wheezing. Notify if symptoms persist despite rescue inhaler/neb use.  Take your rescue inhaler with you to surgery. Use incentive spirometer ten times an hour while awake during recovery period. Up out of bed as soon as possible.   Chest x ray today  Follow up with Dr. Mayford Knife regarding your CPAP  Follow up in one year with Dr. Wynona Neat. If symptoms worsen, please contact office for sooner follow up or seek emergency care.

## 2023-06-19 DIAGNOSIS — N3941 Urge incontinence: Secondary | ICD-10-CM | POA: Diagnosis not present

## 2023-06-29 ENCOUNTER — Ambulatory Visit: Payer: Medicare Other | Admitting: Primary Care

## 2023-07-02 ENCOUNTER — Other Ambulatory Visit: Payer: Self-pay | Admitting: Orthopedic Surgery

## 2023-07-02 DIAGNOSIS — M47816 Spondylosis without myelopathy or radiculopathy, lumbar region: Secondary | ICD-10-CM | POA: Diagnosis not present

## 2023-07-02 DIAGNOSIS — M5417 Radiculopathy, lumbosacral region: Secondary | ICD-10-CM | POA: Diagnosis not present

## 2023-07-02 NOTE — Progress Notes (Addendum)
COVID Vaccine received:  []  No [x]  Yes Date of any COVID positive Test in last 90 days:  PCP - Irena Reichmann, DO  at [facility]  [phone] Cardiologist - Armanda Magic, MD  Pulmonary- Virl Diamond, MD,  Micheline Maze, NP    Medical clearance 06-18-23  note in Epic  Chest x-ray - 06-18-2023   2v  Epic EKG - 10-02-2022  Epic  Stress Test -  ECHO -  Cardiac Cath -  CTA Cardiac Calcium score-  0  on 09-13-2022  Epic   PCR screen: [x]  Ordered & Completed           []   No Order but Needs PROFEND           []   N/A for this surgery  Surgery Plan:  [x]  Ambulatory                            []  Outpatient in bed                            []  Admit  Anesthesia:    []  General  []  Spinal                           [x]   Choice []   MAC  Pacemaker / ICD device [x]  No []  Yes   Spinal Cord Stimulator:[x]  No []  Yes  Has [employer]  INTERSTIM Implant (2003)  for incontinence     History of Sleep Apnea? []  No [x]  Yes   CPAP used?- [x]  No []  Yes  can't tolerate  Does the patient monitor blood sugar?          []  No [x]  Yes  []  N/A  Patient has: []  NO Hx DM   []  Pre-DM                 []  DM1  [x]   DM2 Does patient have a [employer] or Dexacom? [x]  No []  Yes   Fasting Blood Sugar Ranges- 120-150 Checks Blood Sugar _1_ times a day  Diabetic medications/ instructions: Metformin  500mg  bid  hold DOS   patient aware  Blood Thinner / Instructions :   None Aspirin Instructions:   None  ERAS Protocol Ordered: []  No  [x]  Yes     PRE-SURGERY []  ENSURE  [x]  G2  Patient is to be NPO after: 0430  Comments: Patient was given the 5 CHG shower / bath instructions for THA / TKA / Total or Reverse Shoulder arthroplasty surgery along with 2 bottles of the CHG soap. Patient will start this on:  Sunday 07-15-2023  All questions were asked and answered, Patient voiced understanding of this process.   The patient was given Benzoyl peroxide Gel as ordered. Instruction regarding application starting 2 days  prior to surgery was given and patient voiced understanding.   Activity level: Patient is  unable to climb a flight of stairs without difficulty; [x]  No CP  but would have SOB.  Patient can perform ADLs without assistance.   Anesthesia review: DM2, OSA-CPAP, GERD, has InterStim implant for incontinence,hx Pericarditis, Hx scarlet fever, GERD, " shallow breathing" after anesthesia, Mild obstructive airway disease.   Patient denies shortness of breath, fever, cough and chest pain at PAT appointment.  Patient verbalized understanding and agreement to the Pre-Surgical Instructions that were given to them at this PAT appointment. Patient  was also educated of the need to review these PAT instructions again prior to her surgery.I reviewed the appropriate phone numbers to call if they have any and questions or concerns.

## 2023-07-05 NOTE — Patient Instructions (Addendum)
SURGICAL WAITING ROOM VISITATION Patients having surgery or a procedure may have no more than 2 support people in the waiting area - these visitors may rotate.    Children under the age of 37 must have an adult with them who is not the patient.  If the patient needs to stay at the hospital during part of their recovery, the visitor guidelines for inpatient rooms apply. Pre-op nurse will coordinate an appropriate time for 1 support person to accompany patient in pre-op.  This support person may not rotate.    Please refer to the [facility] website for the visitor guidelines for Inpatients (after your surgery is over and you are in a regular room).       Your procedure is scheduled on: 07-19-23   Report to [facility] Main Entrance    Report to admitting at 5:15 AM   Call this number if you have problems the morning of surgery [phone]   Do not eat food :After Midnight.   After Midnight you may have the following liquids until 4:30 AM DAY OF SURGERY  Water Non-Citrus Juices (without pulp, NO RED-Apple, White grape, White cranberry) Black Coffee (NO MILK/CREAM OR CREAMERS, sugar ok)  Clear Tea (NO MILK/CREAM OR CREAMERS, sugar ok) regular and decaf                             Plain Jell-O (NO RED)                                           Fruit ices (not with fruit pulp, NO RED)                                     Popsicles (NO RED)                                                               Sports drinks like Gatorade (NO RED)                    The day of surgery:  Drink ONE (1) Pre-Surgery G2 at 4:30 AM the morning of surgery. Drink in one sitting. Do not sip.  This drink was given to you during your hospital  pre-op appointment visit. Nothing else to drink after completing the Pre-Surgery G2.          If you have questions, please contact your surgeon's office.   FOLLOW ANY ADDITIONAL PRE OP INSTRUCTIONS YOU RECEIVED FROM YOUR SURGEON'S OFFICE!!!      Oral Hygiene is also important to reduce your risk of infection.                                    Remember - BRUSH YOUR TEETH THE MORNING OF SURGERY WITH YOUR REGULAR TOOTHPASTE   Do NOT smoke after Midnight   Take these medicines the morning of surgery with A SIP OF WATER:   Levothyroxine  Gabapentin  Omeprazole  Solifenacin  Tramadol if needed  Stop all vitamins and herbal supplements 7 days before surgery  WHAT DO I DO ABOUT MY DIABETES MEDICATION?  Do not take oral diabetes medicines (METFORMIN ) the morning of surgery.   Bring CPAP mask and tubing day of surgery.                              You may not have any metal on your body including hair pins, jewelry, and body piercing             Do not wear make-up, lotions, powders, perfumes or deodorant  Do not wear nail polish including gel and S&S, artificial/acrylic nails, or any other type of covering on natural nails including finger and toenails. If you have artificial nails, gel coating, etc. that needs to be removed by a nail salon please have this removed prior to surgery or surgery may need to be canceled/ delayed if the surgeon/ anesthesia feels like they are unable to be safely monitored.   Do not shave  48 hours prior to surgery.    Do not bring valuables to the hospital. [facility] IS NOT RESPONSIBLE   FOR VALUABLES.   Contacts, dentures or bridgework may not be worn into surgery.  DO NOT BRING YOUR HOME MEDICATIONS TO THE HOSPITAL. PHARMACY WILL DISPENSE MEDICATIONS LISTED ON YOUR MEDICATION LIST TO YOU DURING YOUR ADMISSION IN THE HOSPITAL!    Patients discharged on the day of surgery will not be allowed to drive home.  Someone NEEDS to stay with you for the first 24 hours after anesthesia.   Special Instructions: Bring a copy of your healthcare power of attorney and living will documents the day of surgery if you haven't scanned them before.              Please read over the following fact sheets you  were given: IF YOU HAVE QUESTIONS ABOUT YOUR PRE-OP INSTRUCTIONS PLEASE CALL [phone]  If you received a COVID test during your pre-op visit  it is requested that you wear a mask when out in public, stay away from anyone that may not be feeling well and notify your surgeon if you develop symptoms. If you test positive for Covid or have been in contact with anyone that has tested positive in the last 10 days please notify you surgeon.  [place]- Preparing for Total Shoulder Arthroplasty    Before surgery, you can play an important role. Because skin is not sterile, your skin needs to be as free of germs as possible. You can reduce the number of germs on your skin by using the following products. Benzoyl Peroxide Gel Reduces the number of germs present on the skin Applied twice a day to shoulder area starting two days before surgery    ==================================================================    Pre-operative 5 CHG Bath Instructions   You can play a key role in reducing the risk of infection after surgery. Your skin needs to be as free of germs as possible. You can reduce the number of germs on your skin by washing with CHG (chlorhexidine gluconate) soap before surgery. CHG is an antiseptic soap that kills germs and continues to kill germs even after washing.   DO NOT use if you have an allergy to chlorhexidine/CHG or antibacterial soaps. If your skin becomes reddened or irritated, stop using the CHG and notify one of our RNs at  [phone] .   Please shower  with the CHG soap starting 4 days before surgery using the following schedule:     Please keep in mind the following:  DO NOT shave, including legs and underarms, starting the day of your first shower.   You may shave your face at any point before/day of surgery.  Place clean sheets on your bed the day you start using CHG soap. Use a clean washcloth (not used since being washed) for each shower. DO NOT sleep with  pets once you start using the CHG.   CHG Shower Instructions:  If you choose to wash your hair and private area, wash first with your normal shampoo/soap.  After you use shampoo/soap, rinse your hair and body thoroughly to remove shampoo/soap residue.  Turn the water OFF and apply about 3 tablespoons (45 ml) of CHG soap to a CLEAN washcloth.  Apply CHG soap ONLY FROM YOUR NECK DOWN TO YOUR TOES (washing for 3-5 minutes)  DO NOT use CHG soap on face, private areas, open wounds, or sores.  Pay special attention to the area where your surgery is being performed.  If you are having back surgery, having someone wash your back for you may be helpful. Wait 2 minutes after CHG soap is applied, then you may rinse off the CHG soap.  Pat dry with a clean towel  Put on clean clothes/pajamas   If you choose to wear lotion, please use ONLY the CHG-compatible lotions on the back of this paper.     Additional instructions for the day of surgery: DO NOT APPLY any lotions, deodorants, cologne, or perfumes.   Put on clean/comfortable clothes.  Brush your teeth.  Ask your nurse before applying any prescription medications to the skin.      CHG Compatible Lotions   Aveeno Moisturizing lotion  Cetaphil Moisturizing Cream  Cetaphil Moisturizing Lotion  Clairol Herbal Essence Moisturizing Lotion, Dry Skin  Clairol Herbal Essence Moisturizing Lotion, Extra Dry Skin  Clairol Herbal Essence Moisturizing Lotion, Normal Skin  Curel Age Defying Therapeutic Moisturizing Lotion with Alpha Hydroxy  Curel Extreme Care Body Lotion  Curel Soothing Hands Moisturizing Hand Lotion  Curel Therapeutic Moisturizing Cream, Fragrance-Free  Curel Therapeutic Moisturizing Lotion, Fragrance-Free  Curel Therapeutic Moisturizing Lotion, Original Formula  Eucerin Daily Replenishing Lotion  Eucerin Dry Skin Therapy Plus Alpha Hydroxy Crme  Eucerin Dry Skin Therapy Plus Alpha Hydroxy Lotion  Eucerin Original Crme  Eucerin  Original Lotion  Eucerin Plus Crme Eucerin Plus Lotion  Eucerin TriLipid Replenishing Lotion  Keri Anti-Bacterial Hand Lotion  Keri Deep Conditioning Original Lotion Dry Skin Formula Softly Scented  Keri Deep Conditioning Original Lotion, Fragrance Free Sensitive Skin Formula  Keri Lotion Fast Absorbing Fragrance Free Sensitive Skin Formula  Keri Lotion Fast Absorbing Softly Scented Dry Skin Formula  Keri Original Lotion  Keri Skin Renewal Lotion Keri Silky Smooth Lotion  Keri Silky Smooth Sensitive Skin Lotion  Nivea Body Creamy Conditioning Oil  Nivea Body Extra Enriched Lotion  Nivea Body Original Lotion  Nivea Body Sheer Moisturizing Lotion Nivea Crme  Nivea Skin Firming Lotion  NutraDerm 30 Skin Lotion  NutraDerm Skin Lotion  NutraDerm Therapeutic Skin Cream  NutraDerm Therapeutic Skin Lotion  ProShield Protective Hand Cream  Provon moisturizing lotion      Preparing for Total Shoulder Arthroplasty ================================================================= Please follow these instructions carefully, in addition to any other special Bathing information that was explained to you at the Presurgical Appointment:  BENZOYL PEROXIDE 5% GEL: Used to kill bacteria on the skin which could cause  an infection at the surgery site.   Please do not use if you have an allergy to benzoyl peroxide. If your skin becomes reddened/irritated stop using the benzoyl peroxide and inform your Doctor.   Starting two days before surgery, apply as follows:  1. Apply benzoyl peroxide gel in the morning and at night. Apply after taking a shower. If you are not taking a shower, clean entire shoulder front, back, and side, along with the armpit with a clean wet washcloth.  2. Place a quarter-sized dollop of the gel on your SHOULDER and rub in thoroughly, making sure to cover the front, back, and side of your shoulder, along with the armpit.   2 Days prior to Surgery  TUESDAY  07-17-2023 First  Application _______ Morning Second Application _______ Night  Day Before Surgery     [facility]  07-18-2023 First Application______ Morning  On the night before surgery, wash your entire body (except hair, face and private areas) with CHG Soap. THEN, rub in the LAST application of the Benzoyl Peroxide Gel on your shoulder.   3. On the Morning of Surgery wash your BODY AGAIN with CHG Soap (except hair, face and private areas)  4. DO NOT USE THE BENZOYL PEROXIDE GEL ON THE DAY OF YOUR SURGERY      PATIENT SIGNATURE_________________________________  NURSE SIGNATURE__________________________________  ________________________________________________________________________      Kelly Taylor  An incentive spirometer is a tool that can help keep your lungs clear and active. This tool measures how well you are filling your lungs with each breath. Taking long deep breaths may help reverse or decrease the chance of developing breathing (pulmonary) problems (especially infection) following: A long period of time when you are unable to move or be active. BEFORE THE PROCEDURE  If the spirometer includes an indicator to show your best effort, your nurse or respiratory therapist will set it to a desired goal. If possible, sit up straight or lean slightly forward. Try not to slouch. Hold the incentive spirometer in an upright position. INSTRUCTIONS FOR USE  Sit on the edge of your bed if possible, or sit up as far as you can in bed or on a chair. Hold the incentive spirometer in an upright position. Breathe out normally. Place the mouthpiece in your mouth and seal your lips tightly around it. Breathe in slowly and as deeply as possible, raising the piston or the ball toward the top of the column. Hold your breath for 3-5 seconds or for as long as possible. Allow the piston or ball to fall to the bottom of the column. Remove the mouthpiece from your mouth and breathe out normally. Rest  for a few seconds and repeat Steps 1 through 7 at least 10 times every 1-2 hours when you are awake. Take your time and take a few normal breaths between deep breaths. The spirometer may include an indicator to show your best effort. Use the indicator as a goal to work toward during each repetition. After each set of 10 deep breaths, practice coughing to be sure your lungs are clear. If you have an incision (the cut made at the time of surgery), support your incision when coughing by placing a pillow or rolled up towels firmly against it. Once you are able to get out of bed, walk around indoors and cough well. You may stop using the incentive spirometer when instructed by your caregiver.  RISKS AND COMPLICATIONS Take your time so you do not get dizzy or light-headed. If  you are in pain, you may need to take or ask for pain medication before doing incentive spirometry. It is harder to take a deep breath if you are having pain. AFTER USE Rest and breathe slowly and easily. It can be helpful to keep track of a log of your progress. Your caregiver can provide you with a simple table to help with this. If you are using the spirometer at home, follow these instructions: SEEK MEDICAL CARE IF:  You are having difficultly using the spirometer. You have trouble using the spirometer as often as instructed. Your pain medication is not giving enough relief while using the spirometer. You develop fever of 100.5 F (38.1 C) or higher. SEEK IMMEDIATE MEDICAL CARE IF:  You cough up bloody sputum that had not been present before. You develop fever of 102 F (38.9 C) or greater. You develop worsening pain at or near the incision site. MAKE SURE YOU:  Understand these instructions. Will watch your condition. Will get help right away if you are not doing well or get worse. Document Released: 03/19/2007 Document Revised: 01/29/2012 Document Reviewed: 05/20/2007 [facility] Patient Information 2014 [place],  [place].   ________________________________________________________________________

## 2023-07-06 ENCOUNTER — Encounter (HOSPITAL_COMMUNITY): Payer: Self-pay

## 2023-07-06 ENCOUNTER — Encounter (HOSPITAL_COMMUNITY)
Admission: RE | Admit: 2023-07-06 | Discharge: 2023-07-06 | Disposition: A | Discharge status: Home / Self Care | Payer: Medicare Other | Source: Ambulatory Visit | Attending: Orthopedic Surgery | Admitting: Orthopedic Surgery

## 2023-07-06 ENCOUNTER — Other Ambulatory Visit: Payer: Self-pay

## 2023-07-06 VITALS — BP 120/64 | HR 77 | Temp 99.2°F | Resp 18 | Ht 67.0 in | Wt 295.0 lb

## 2023-07-06 DIAGNOSIS — Z01812 Encounter for preprocedural laboratory examination: Secondary | ICD-10-CM | POA: Insufficient documentation

## 2023-07-06 DIAGNOSIS — Z01818 Encounter for other preprocedural examination: Secondary | ICD-10-CM

## 2023-07-06 DIAGNOSIS — E119 Type 2 diabetes mellitus without complications: Secondary | ICD-10-CM | POA: Diagnosis not present

## 2023-07-06 LAB — BASIC METABOLIC PANEL
Anion gap: 9 (ref 5–15)
BUN: 13 mg/dL (ref 8–23)
CO2: 25 mmol/L (ref 22–32)
Calcium: 8.7 mg/dL — ABNORMAL LOW (ref 8.9–10.3)
Chloride: 105 mmol/L (ref 98–111)
Creatinine, Ser: 0.67 mg/dL (ref 0.44–1.00)
GFR, Estimated: >60 mL/min (ref >60)
Glucose, Bld: 158 mg/dL — ABNORMAL HIGH (ref 70–99)
Potassium: 4.2 mmol/L (ref 3.5–5.1)
Sodium: 139 mmol/L (ref 135–145)

## 2023-07-06 LAB — CBC
HCT: 44.2 % (ref 36.0–46.0)
Hemoglobin: 14 g/dL (ref 12.0–15.0)
MCH: 28.7 pg (ref 26.0–34.0)
MCHC: 31.7 g/dL (ref 30.0–36.0)
MCV: 90.6 fL (ref 80.0–100.0)
Platelets: 258 10*3/uL (ref 150–400)
RBC: 4.88 MIL/uL (ref 3.87–5.11)
RDW: 14.6 % (ref 11.5–15.5)
WBC: 7.8 10*3/uL (ref 4.0–10.5)
nRBC: 0 % (ref 0.0–0.2)

## 2023-07-06 LAB — HEMOGLOBIN A1C
Hgb A1c MFr Bld: 7.2 % — ABNORMAL HIGH (ref 4.8–5.6)
Mean Plasma Glucose: 159.94 mg/dL

## 2023-07-06 LAB — SURGICAL PCR SCREEN
MRSA, PCR: POSITIVE — AB
Staphylococcus aureus: POSITIVE — AB

## 2023-07-06 LAB — GLUCOSE, CAPILLARY: Glucose-Capillary: 164 mg/dL — ABNORMAL HIGH (ref 70–99)

## 2023-07-06 NOTE — Progress Notes (Signed)
Patient's PCR screen is positive for MSRA AND STAPH. Appropriate notes have been placed on the patient's chart. This note has been routed to Dr.Chandler and Emilio Aspen for review. The Patient's surgery is currently scheduled for: 07-19-2023  at [facility].  Rudean Haskell, BSN, CVRN-BC   Pre-Surgical Testing Nurse [facility]  [phone]

## 2023-07-11 DIAGNOSIS — N898 Other specified noninflammatory disorders of vagina: Secondary | ICD-10-CM | POA: Diagnosis not present

## 2023-07-11 DIAGNOSIS — Z6841 Body Mass Index (BMI) 40.0 and over, adult: Secondary | ICD-10-CM | POA: Diagnosis not present

## 2023-07-11 DIAGNOSIS — Z13820 Encounter for screening for osteoporosis: Secondary | ICD-10-CM | POA: Diagnosis not present

## 2023-07-11 DIAGNOSIS — Z1231 Encounter for screening mammogram for malignant neoplasm of breast: Secondary | ICD-10-CM | POA: Diagnosis not present

## 2023-07-11 DIAGNOSIS — K5909 Other constipation: Secondary | ICD-10-CM | POA: Diagnosis not present

## 2023-07-11 DIAGNOSIS — Z01419 Encounter for gynecological examination (general) (routine) without abnormal findings: Secondary | ICD-10-CM | POA: Diagnosis not present

## 2023-07-11 DIAGNOSIS — N951 Menopausal and female climacteric states: Secondary | ICD-10-CM | POA: Diagnosis not present

## 2023-07-11 DIAGNOSIS — Z1211 Encounter for screening for malignant neoplasm of colon: Secondary | ICD-10-CM | POA: Diagnosis not present

## 2023-07-11 NOTE — Care Plan (Signed)
Ortho Bundle Case Management Note  Patient Details  Name: Kelly Taylor MRN: [id number] Date of Birth: [birth year]  Will discharge to home with family to assist. No DME needed. OPPT set up with [facility] [facility]. Patient and MD in agreement. CHoice offered                     DME Arranged:    DME Agency:     HH Arranged:    HH Agency:     Additional Comments: Please contact me with any questions of if this plan should need to change.  Shauna Hugh,  RN,BSN,MHA,CCM  [facility] Orthopaedic Specialist  [phone] 07/11/2023, 4:25 PM

## 2023-07-12 NOTE — Progress Notes (Signed)
Anesthesia Chart Review   Case: [id number] Date/Time: 07/19/23 0715   Procedure: REVERSE SHOULDER ARTHROPLASTY (Right: Shoulder)   Anesthesia type: Choice   Pre-op diagnosis: RIGHT SHOULDER ROTATOR CUFF TEAR ARTHROPATHY   Location: [facility] ROOM 07 / [facility]   Surgeons: Saulters Broom, MD       DISCUSSION: 72 y.o. never smoker with h/o OSA on CPAP, mild obstructive airway disease, DM,bladder stim in place, right shoulder rotator cuff tear scheduled for above procedure 07/19/2023 with Dr. Marner Broom.   H/o post op spinal headache.   Pt seen by pulmonology 06/18/2023 for preoperative evaluation.  Per OV note, "Low to moderate risk. Factors that increase the risk for postoperative pulmonary complications are mild asthma, obesity, severe OSA, age   Respiratory complications generally occur in 1% of ASA Class I patients, 5% of ASA Class II and 10% of ASA Class III-IV patients These complications rarely result in mortality and include postoperative pneumonia, atelectasis, pulmonary embolism, ARDS and increased time requiring postoperative mechanical ventilation.   Overall, I recommend proceeding with the surgery if the risk for respiratory complications are outweighed by the potential benefits. This will need to be discussed between the patient and surgeon.   To reduce risks of respiratory complications, I recommend: --Pre- and post-operative incentive spirometry performed frequently while awake --Inpatient use of currently prescribed positive-pressure for OSA whenever the patient is sleeping --Short duration of surgery as much as possible and avoid paralytic if possible --OOB, encourage mobility post-op"  Pt follows with cardiology for HLD, sleep apnea. Pt last seen 08/30/2022. Coronary CTA 09/13/2022 with coronary calcium score of 0, mildly dilated ascending aorta, 3.9 cm.      VS: BP 120/64 Comment: right arm sitting  Pulse 77   Temp 37.3 C (Oral)   Resp 18   Ht 5\' 7"  (1.702 m)   Wt  133.8 kg   SpO2 94%   BMI 46.20 kg/m   PROVIDERS: Irena Reichmann, DO is PCP   Cardiologist - Armanda Magic, MD   Pulmonary- Virl Diamond, MD LABS: Labs reviewed: Acceptable for surgery. (all labs ordered are listed, but only abnormal results are displayed)  Labs Reviewed  SURGICAL PCR SCREEN - Abnormal; Notable for the following components:      Result Value   MRSA, PCR POSITIVE (*)    Staphylococcus aureus POSITIVE (*)    All other components within normal limits  HEMOGLOBIN A1C - Abnormal; Notable for the following components:   Hgb A1c MFr Bld 7.2 (*)    All other components within normal limits  BASIC METABOLIC PANEL - Abnormal; Notable for the following components:   Glucose, Bld 158 (*)    Calcium 8.7 (*)    All other components within normal limits  GLUCOSE, CAPILLARY - Abnormal; Notable for the following components:   Glucose-Capillary 164 (*)    All other components within normal limits  CBC     IMAGES:   EKG:   CV:  Past Medical History:  Diagnosis Date   Anemia    hx of in 20's   Aortic atherosclerosis (HCC)    Arthritis    Ascending aorta dilatation (HCC)    42mm by CT 10/23   Asthma    Back pain    Breast mass, left 2003   Chronic back pain    seeing Pain specialist   Complication of anesthesia    "shallow breathing with general"   Constipation    DDD (degenerative disc disease)    Depression  Diabetes mellitus    borderline   DJD (degenerative joint disease)    Family history of heart attack    GERD (gastroesophageal reflux disease)    H/O scarlet fever    Hemorrhoids    History of chicken pox    History of measles, mumps, or rubella as child   all 3   Hx of bladder infections    Hypothyroidism    Irritable bowel syndrome    Joint pain    Lumbar stenosis    RFA done May 2015   Menopausal symptoms 2003   Obesity, morbid (HCC) 2006   Pericarditis    hx of   Pneumonia    hx of 5 years ago   Polyp of colon    removed  during colonoscopy   SOBOE (shortness of breath on exertion)    Spinal headache    x1   Thyroid condition    Trichomonas    Urge incontinence 2005   Vertigo    over a year was last episode   Yeast in stool     Past Surgical History:  Procedure Laterality Date   ABDOMINAL HYSTERECTOMY  02/1990   APPENDECTOMY  age 35   BLADDER SUSPENSION     BREAST LUMPECTOMY Left 2003   BREAST REDUCTION SURGERY  1992   CHOLECYSTECTOMY  2010   COLONOSCOPY  may 2015   4 polyps-benign   DILATION AND CURETTAGE OF UTERUS     FOOT SURGERY Right    pinched nerve   HAND SURGERY Right    right-Dr. Amanda Pea   HERNIA REPAIR Right 1992   INTERSTIM IMPLANT PLACEMENT Right 2003   for incontinence   KNEE SURGERY     bilateral knee; x3 right/ x2 left   REVERSE SHOULDER ARTHROPLASTY Left 04/19/2017   Procedure: REVERSE SHOULDER ARTHROPLASTY;  Surgeon: Christoph Broom, MD;  Location: [facility];  Service: Orthopedics;  Laterality: Left;  REVERSE SHOULDER ARTHROPLASTY   ROTATOR CUFF REPAIR  2008   right, almost complete reconstruction, bone anchors   TONSILLECTOMY  age 58   TOTAL HIP ARTHROPLASTY Right 07/30/2014   Procedure: RIGHT TOTAL HIP ARTHROPLASTY ANTERIOR APPROACH;  Surgeon: Kathryne Hitch, MD;  Location: [facility];  Service: Orthopedics;  Laterality: Right;   TOTAL KNEE ARTHROPLASTY Right 04/07/2013   Procedure: RIGHT TOTAL KNEE ARTHROPLASTY;  Surgeon: Loanne Drilling, MD;  Location[facility];  Service: Orthopedics;  Laterality: Right;   TOTAL KNEE ARTHROPLASTY Left 09/22/2013   Procedure: LEFT TOTAL KNEE ARTHROPLASTY;  Surgeon: Loanne Drilling, MD;  Location: [facility];  Service: Orthopedics;  Laterality: Left;    MEDICATIONS:  albuterol (PROVENTIL HFA;VENTOLIN HFA) 108 (90 BASE) MCG/ACT inhaler   Calcium Carbonate Antacid (CALCIUM CARBONATE PO)   cyclobenzaprine (FLEXERIL) 10 MG tablet   diclofenac sodium (VOLTAREN) 1 % GEL   docusate sodium (COLACE) 100 MG capsule   gabapentin (NEURONTIN) 100 MG  capsule   levothyroxine (SYNTHROID) 75 MCG tablet   magnesium oxide (MAG-OX) 400 (240 Mg) MG tablet   meclizine (ANTIVERT) 25 MG tablet   meloxicam (MOBIC) 15 MG tablet   metFORMIN (GLUCOPHAGE-XR) 500 MG 24 hr tablet   Misc Natural Products (FLEX-A-MIN JOINT FLEX PO)   Misc Natural Products (LEG VEIN & CIRCULATION PO)   Multiple Vitamins-Minerals (HAIR SKIN NAILS PO)   Multiple Vitamins-Minerals (MULTIVITAMIN WITH MINERALS) tablet   Omega-3 Fatty Acids (FISH OIL PO)   omeprazole (PRILOSEC) 20 MG capsule   polyethylene glycol (MIRALAX / GLYCOLAX) packet   Probiotic Product (PROBIOTIC  PO)   simvastatin (ZOCOR) 10 MG tablet   solifenacin (VESICARE) 10 MG tablet   traMADol (ULTRAM) 50 MG tablet   No current facility-administered medications for this encounter.   Jodell Cipro Ward, PA-C [facility] Pre-Surgical Testing [phone]

## 2023-07-12 NOTE — Anesthesia Preprocedure Evaluation (Addendum)
Anesthesia Evaluation  Patient identified by MRN, date of birth, ID band Patient awake    Reviewed: Allergy & Precautions, NPO status , Patient's Chart, lab work & pertinent test results  History of Anesthesia Complications (+) POST - OP SPINAL HEADACHE and history of anesthetic complications  Airway Mallampati: II  TM Distance: >3 FB Neck ROM: Full    Dental  (+) Teeth Intact, Dental Advisory Given   Pulmonary shortness of breath, asthma    Pulmonary exam normal breath sounds clear to auscultation       Cardiovascular + Peripheral Vascular Disease (Ascending aorta dilatation)  Normal cardiovascular exam Rhythm:Regular Rate:Normal     Neuro/Psych  Headaches PSYCHIATRIC DISORDERS  Depression       GI/Hepatic Neg liver ROS,GERD  Medicated,,  Endo/Other  diabetes, Type 2, Oral Hypoglycemic AgentsHypothyroidism  Morbid obesity  Renal/GU negative Renal ROS     Musculoskeletal  (+) Arthritis ,  RIGHT SHOULDER ROTATOR CUFF TEAR ARTHROPATHY   Abdominal   Peds  Hematology negative hematology ROS (+)   Anesthesia Other Findings   Reproductive/Obstetrics                              Anesthesia Physical Anesthesia Plan  ASA: 3  Anesthesia Plan: General   Post-op Pain Management: Tylenol PO (pre-op)* and Regional block*   Induction: Intravenous  PONV Risk Score and Plan: 3 and Dexamethasone and Ondansetron  Airway Management Planned: Oral ETT  Additional Equipment:   Intra-op Plan:   Post-operative Plan: Extubation in OR  Informed Consent: I have reviewed the patients History and Physical, chart, labs and discussed the procedure including the risks, benefits and alternatives for the proposed anesthesia with the patient or authorized representative who has indicated his/her understanding and acceptance.     Dental advisory given  Plan Discussed with: CRNA  Anesthesia Plan Comments:  (See PAT note 07/06/2023)        Anesthesia Quick Evaluation

## 2023-07-18 ENCOUNTER — Other Ambulatory Visit: Payer: Self-pay | Admitting: Orthopedic Surgery

## 2023-07-19 ENCOUNTER — Other Ambulatory Visit: Payer: Self-pay

## 2023-07-19 ENCOUNTER — Ambulatory Visit (HOSPITAL_COMMUNITY): Payer: Medicare Other | Admitting: Physician Assistant

## 2023-07-19 ENCOUNTER — Encounter (HOSPITAL_COMMUNITY)
Admission: RE | Disposition: A | Discharge status: Home / Self Care | Payer: Self-pay | Source: Home / Self Care | Attending: Orthopedic Surgery

## 2023-07-19 ENCOUNTER — Ambulatory Visit (HOSPITAL_COMMUNITY)
Admission: RE | Admit: 2023-07-19 | Discharge: 2023-07-19 | Disposition: A | Discharge status: Home / Self Care | Payer: Medicare Other | Attending: Orthopedic Surgery | Admitting: Orthopedic Surgery

## 2023-07-19 ENCOUNTER — Encounter (HOSPITAL_COMMUNITY): Payer: Self-pay | Admitting: Orthopedic Surgery

## 2023-07-19 ENCOUNTER — Ambulatory Visit (HOSPITAL_BASED_OUTPATIENT_CLINIC_OR_DEPARTMENT_OTHER): Payer: Medicare Other | Admitting: Anesthesiology

## 2023-07-19 DIAGNOSIS — E039 Hypothyroidism, unspecified: Secondary | ICD-10-CM | POA: Diagnosis not present

## 2023-07-19 DIAGNOSIS — I77819 Aortic ectasia, unspecified site: Secondary | ICD-10-CM | POA: Diagnosis not present

## 2023-07-19 DIAGNOSIS — K219 Gastro-esophageal reflux disease without esophagitis: Secondary | ICD-10-CM | POA: Insufficient documentation

## 2023-07-19 DIAGNOSIS — M75101 Unspecified rotator cuff tear or rupture of right shoulder, not specified as traumatic: Secondary | ICD-10-CM

## 2023-07-19 DIAGNOSIS — E1169 Type 2 diabetes mellitus with other specified complication: Secondary | ICD-10-CM | POA: Diagnosis not present

## 2023-07-19 DIAGNOSIS — Z7984 Long term (current) use of oral hypoglycemic drugs: Secondary | ICD-10-CM

## 2023-07-19 DIAGNOSIS — Z96611 Presence of right artificial shoulder joint: Secondary | ICD-10-CM | POA: Diagnosis not present

## 2023-07-19 DIAGNOSIS — M19011 Primary osteoarthritis, right shoulder: Secondary | ICD-10-CM | POA: Diagnosis not present

## 2023-07-19 DIAGNOSIS — Z6841 Body Mass Index (BMI) 40.0 and over, adult: Secondary | ICD-10-CM | POA: Diagnosis not present

## 2023-07-19 DIAGNOSIS — E119 Type 2 diabetes mellitus without complications: Secondary | ICD-10-CM

## 2023-07-19 DIAGNOSIS — E1151 Type 2 diabetes mellitus with diabetic peripheral angiopathy without gangrene: Secondary | ICD-10-CM

## 2023-07-19 DIAGNOSIS — J45909 Unspecified asthma, uncomplicated: Secondary | ICD-10-CM

## 2023-07-19 DIAGNOSIS — G8918 Other acute postprocedural pain: Secondary | ICD-10-CM | POA: Diagnosis not present

## 2023-07-19 DIAGNOSIS — M25711 Osteophyte, right shoulder: Secondary | ICD-10-CM | POA: Diagnosis not present

## 2023-07-19 HISTORY — PX: REVERSE SHOULDER ARTHROPLASTY: SHX5054

## 2023-07-19 LAB — GLUCOSE, CAPILLARY
Glucose-Capillary: 163 mg/dL — ABNORMAL HIGH (ref 70–99)
Glucose-Capillary: 128 mg/dL — ABNORMAL HIGH (ref 70–99)

## 2023-07-19 SURGERY — ARTHROPLASTY, SHOULDER, TOTAL, REVERSE
Anesthesia: General | Site: Shoulder | Laterality: Right

## 2023-07-19 MED ORDER — LIDOCAINE HCL (PF) 2 % IJ SOLN
INTRAMUSCULAR | Status: AC
  Filled 2023-07-19: qty 5

## 2023-07-19 MED ORDER — MIDAZOLAM HCL 2 MG/2ML IJ SOLN
INTRAMUSCULAR | Status: DC | PRN
  Administered 2023-07-19: 2 mg via INTRAVENOUS

## 2023-07-19 MED ORDER — PHENYLEPHRINE 80 MCG/ML (10ML) SYRINGE FOR IV PUSH (FOR BLOOD PRESSURE SUPPORT)
PREFILLED_SYRINGE | INTRAVENOUS | Status: DC | PRN
  Administered 2023-07-19: 160 ug via INTRAVENOUS
  Administered 2023-07-19: 80 ug via INTRAVENOUS

## 2023-07-19 MED ORDER — VANCOMYCIN HCL 1500 MG/300ML IV SOLN
1500.0000 mg | Freq: Once | INTRAVENOUS | Status: AC
  Administered 2023-07-19 (×2): 1500 mg via INTRAVENOUS
  Filled 2023-07-19: qty 300

## 2023-07-19 MED ORDER — INSULIN ASPART 100 UNIT/ML IJ SOLN
0.0000 [IU] | INTRAMUSCULAR | Status: DC | PRN
  Administered 2023-07-19: 2 [IU] via SUBCUTANEOUS
  Filled 2023-07-19: qty 1

## 2023-07-19 MED ORDER — 0.9 % SODIUM CHLORIDE (POUR BTL) OPTIME
TOPICAL | Status: DC | PRN
  Administered 2023-07-19: 1000 mL

## 2023-07-19 MED ORDER — BUPIVACAINE LIPOSOME 1.3 % IJ SUSP
INTRAMUSCULAR | Status: DC | PRN
Start: 2023-07-19 — End: 2023-07-19
  Administered 2023-07-19: 10 mL via PERINEURAL

## 2023-07-19 MED ORDER — PHENYLEPHRINE 80 MCG/ML (10ML) SYRINGE FOR IV PUSH (FOR BLOOD PRESSURE SUPPORT)
PREFILLED_SYRINGE | INTRAVENOUS | Status: AC
  Filled 2023-07-19: qty 10

## 2023-07-19 MED ORDER — PROPOFOL 10 MG/ML IV BOLUS
INTRAVENOUS | Status: AC
  Filled 2023-07-19: qty 20

## 2023-07-19 MED ORDER — MIDAZOLAM HCL 2 MG/2ML IJ SOLN
INTRAMUSCULAR | Status: AC
  Filled 2023-07-19: qty 2

## 2023-07-19 MED ORDER — CYCLOBENZAPRINE HCL 10 MG PO TABS: 10.0000 mg | ORAL_TABLET | Freq: Three times a day (TID) | ORAL | 0 refills | Status: DC | PRN

## 2023-07-19 MED ORDER — CHLORHEXIDINE GLUCONATE 0.12 % MT SOLN
15.0000 mL | Freq: Once | OROMUCOSAL | Status: AC
  Administered 2023-07-19: 15 mL via OROMUCOSAL

## 2023-07-19 MED ORDER — LIDOCAINE 2% (20 MG/ML) 5 ML SYRINGE
INTRAMUSCULAR | Status: DC | PRN
  Administered 2023-07-19: 80 mg via INTRAVENOUS

## 2023-07-19 MED ORDER — MUPIROCIN 2 % EX OINT: 1.0000 | TOPICAL_OINTMENT | Freq: Two times a day (BID) | CUTANEOUS | 0 refills | Status: AC

## 2023-07-19 MED ORDER — ACETAMINOPHEN 500 MG PO TABS
1000.0000 mg | ORAL_TABLET | Freq: Once | ORAL | Status: AC
  Administered 2023-07-19: 1000 mg via ORAL
  Filled 2023-07-19: qty 2

## 2023-07-19 MED ORDER — HYDROMORPHONE HCL 2 MG PO TABS: 2.0000 mg | ORAL_TABLET | Freq: Four times a day (QID) | ORAL | 0 refills | Status: AC | PRN

## 2023-07-19 MED ORDER — SODIUM CHLORIDE 0.9 % IR SOLN
Status: DC | PRN
  Administered 2023-07-19: 1000 mL

## 2023-07-19 MED ORDER — TRANEXAMIC ACID-NACL 1000-0.7 MG/100ML-% IV SOLN
INTRAVENOUS | Status: DC | PRN
  Administered 2023-07-19: 1000 mg via INTRAVENOUS

## 2023-07-19 MED ORDER — CHLORHEXIDINE GLUCONATE 4 % EX SOLN: 1.0000 | CUTANEOUS | 1 refills | Status: DC

## 2023-07-19 MED ORDER — LACTATED RINGERS IV SOLN: INTRAVENOUS | Status: DC

## 2023-07-19 MED ORDER — DEXAMETHASONE SODIUM PHOSPHATE 10 MG/ML IJ SOLN
INTRAMUSCULAR | Status: DC | PRN
  Administered 2023-07-19: 10 mg via INTRAVENOUS

## 2023-07-19 MED ORDER — ROCURONIUM BROMIDE 10 MG/ML (PF) SYRINGE
PREFILLED_SYRINGE | INTRAVENOUS | Status: AC
  Filled 2023-07-19: qty 10

## 2023-07-19 MED ORDER — ORAL CARE MOUTH RINSE: 15.0000 mL | Freq: Once | OROMUCOSAL | Status: AC

## 2023-07-19 MED ORDER — PHENYLEPHRINE HCL-NACL 20-0.9 MG/250ML-% IV SOLN
INTRAVENOUS | Status: DC | PRN
  Administered 2023-07-19: 40 ug/min via INTRAVENOUS

## 2023-07-19 MED ORDER — BUPIVACAINE HCL (PF) 0.5 % IJ SOLN
INTRAMUSCULAR | Status: DC | PRN
  Administered 2023-07-19: 10 mL via PERINEURAL

## 2023-07-19 MED ORDER — FENTANYL CITRATE (PF) 250 MCG/5ML IJ SOLN
INTRAMUSCULAR | Status: DC | PRN
  Administered 2023-07-19 (×2): 50 ug via INTRAVENOUS

## 2023-07-19 MED ORDER — TRANEXAMIC ACID-NACL 1000-0.7 MG/100ML-% IV SOLN
INTRAVENOUS | Status: AC
  Filled 2023-07-19: qty 100

## 2023-07-19 MED ORDER — SUGAMMADEX SODIUM 200 MG/2ML IV SOLN
INTRAVENOUS | Status: DC | PRN
  Administered 2023-07-19: 200 mg via INTRAVENOUS

## 2023-07-19 MED ORDER — CEFAZOLIN IN SODIUM CHLORIDE 3-0.9 GM/100ML-% IV SOLN
3.0000 g | INTRAVENOUS | Status: AC
  Administered 2023-07-19: 3 g via INTRAVENOUS
  Filled 2023-07-19: qty 100

## 2023-07-19 MED ORDER — ROCURONIUM BROMIDE 10 MG/ML (PF) SYRINGE
PREFILLED_SYRINGE | INTRAVENOUS | Status: DC | PRN
  Administered 2023-07-19: 60 mg via INTRAVENOUS

## 2023-07-19 MED ORDER — FENTANYL CITRATE (PF) 100 MCG/2ML IJ SOLN
INTRAMUSCULAR | Status: AC
  Filled 2023-07-19: qty 2

## 2023-07-19 MED ORDER — PROPOFOL 10 MG/ML IV BOLUS
INTRAVENOUS | Status: DC | PRN
Start: 2023-07-19 — End: 2023-07-19
  Administered 2023-07-19: 160 mg via INTRAVENOUS

## 2023-07-19 MED ORDER — WATER FOR IRRIGATION, STERILE IR SOLN
Status: DC | PRN
  Administered 2023-07-19: 2000 mL

## 2023-07-19 MED ORDER — DEXAMETHASONE SODIUM PHOSPHATE 10 MG/ML IJ SOLN
INTRAMUSCULAR | Status: AC
  Filled 2023-07-19: qty 1

## 2023-07-19 MED ORDER — ONDANSETRON HCL 4 MG/2ML IJ SOLN
INTRAMUSCULAR | Status: DC | PRN
  Administered 2023-07-19: 4 mg via INTRAVENOUS

## 2023-07-19 SURGICAL SUPPLY — 85 items
AID PSTN UNV HD RSTRNT DISP (MISCELLANEOUS) ×1
APL SKNCLS STERI-STRIP NONHPOA (GAUZE/BANDAGES/DRESSINGS) ×1
BAG COUNTER SPONGE SURGICOUNT (BAG) IMPLANT
BAG SPEC THK2 15X12 ZIP CLS (MISCELLANEOUS) ×1
BAG SPNG CNTER NS LX DISP (BAG)
BAG ZIPLOCK 12X15 (MISCELLANEOUS) ×1 IMPLANT
BASEPLATE P2 COATD GLND 6.5X30 (Shoulder) IMPLANT
BENZOIN TINCTURE PRP APPL 2/3 (GAUZE/BANDAGES/DRESSINGS) IMPLANT
BIT DRILL 1.6MX128 (BIT) IMPLANT
BIT DRILL 2.5 DIA 127 CALI (BIT) IMPLANT
BIT DRILL 4 DIA CALIBRATED (BIT) IMPLANT
BLADE SAW SGTL 73X25 THK (BLADE) ×1 IMPLANT
BOOTIES KNEE HIGH SLOAN (MISCELLANEOUS) ×2 IMPLANT
BSPLAT GLND 30 STRL LF SHLDR (Shoulder) ×1 IMPLANT
COOLER ICEMAN CLASSIC (MISCELLANEOUS) IMPLANT
COVER BACK TABLE 60X90IN (DRAPES) ×1 IMPLANT
COVER SURGICAL LIGHT HANDLE (MISCELLANEOUS) ×1 IMPLANT
DRAPE INCISE IOBAN 66X45 STRL (DRAPES) ×1 IMPLANT
DRAPE ORTHO SPLIT 77X108 STRL (DRAPES) ×2
DRAPE POUCH INSTRU U-SHP 10X18 (DRAPES) ×1 IMPLANT
DRAPE SHEET LG 3/4 BI-LAMINATE (DRAPES) ×1 IMPLANT
DRAPE SURG 17X11 SM STRL (DRAPES) ×1 IMPLANT
DRAPE SURG ORHT 6 SPLT 77X108 (DRAPES) ×2 IMPLANT
DRAPE TOP 10253 STERILE (DRAPES) ×1 IMPLANT
DRAPE U-SHAPE 47X51 STRL (DRAPES) ×1 IMPLANT
DRSG AQUACEL AG ADV 3.5X 6 (GAUZE/BANDAGES/DRESSINGS) ×1 IMPLANT
DURAPREP 26ML APPLICATOR (WOUND CARE) ×2 IMPLANT
ELECT BLADE TIP CTD 4 INCH (ELECTRODE) ×1 IMPLANT
ELECT REM PT RETURN 15FT ADLT (MISCELLANEOUS) ×1 IMPLANT
FACESHIELD WRAPAROUND (MASK) ×1 IMPLANT
FACESHIELD WRAPAROUND OR TEAM (MASK) ×1 IMPLANT
GLOVE BIO SURGEON STRL SZ7.5 (GLOVE) ×1 IMPLANT
GLOVE BIOGEL PI IND STRL 6.5 (GLOVE) ×1 IMPLANT
GLOVE BIOGEL PI IND STRL 8 (GLOVE) ×1 IMPLANT
GLOVE SURG SS PI 6.5 STRL IVOR (GLOVE) ×1 IMPLANT
GOWN STRL REUS W/ TWL LRG LVL3 (GOWN DISPOSABLE) ×1 IMPLANT
GOWN STRL REUS W/ TWL XL LVL3 (GOWN DISPOSABLE) ×1 IMPLANT
GOWN STRL REUS W/TWL LRG LVL3 (GOWN DISPOSABLE) ×1
GOWN STRL REUS W/TWL XL LVL3 (GOWN DISPOSABLE) ×1
HANDPIECE INTERPULSE COAX TIP (DISPOSABLE) ×1
HOOD PEEL AWAY T7 (MISCELLANEOUS) ×3 IMPLANT
HUMERA STEM SM SHELL SHOU 10 (Miscellaneous) ×1 IMPLANT
INSERT SMALL SOCKET 32MM (Insert) IMPLANT
KIT BASIN OR (CUSTOM PROCEDURE TRAY) ×1 IMPLANT
KIT TURNOVER KIT A (KITS) IMPLANT
MANIFOLD NEPTUNE II (INSTRUMENTS) ×1 IMPLANT
NDL TROCAR POINT SZ 2 1/2 (NEEDLE) IMPLANT
NEEDLE TROCAR POINT SZ 2 1/2 (NEEDLE) IMPLANT
NS IRRIG 1000ML POUR BTL (IV SOLUTION) ×1 IMPLANT
P2 COATDE GLNOID BSEPLT 6.5X30 (Shoulder) ×1 IMPLANT
PACK SHOULDER (CUSTOM PROCEDURE TRAY) ×1 IMPLANT
PAD COLD SHLDR WRAP-ON (PAD) IMPLANT
PROTECTOR NERVE ULNAR (MISCELLANEOUS) IMPLANT
RESTRAINT HEAD UNIVERSAL NS (MISCELLANEOUS) ×1 IMPLANT
RETRIEVER SUT HEWSON (MISCELLANEOUS) IMPLANT
SCREW BONE LOCKING RSP 5.0X14 (Screw) ×1 IMPLANT
SCREW BONE LOCKING RSP 5.0X30 (Screw) ×1 IMPLANT
SCREW BONE RSP LOCK 5X14 (Screw) IMPLANT
SCREW BONE RSP LOCK 5X18 (Screw) IMPLANT
SCREW BONE RSP LOCK 5X22 (Screw) IMPLANT
SCREW BONE RSP LOCK 5X30 (Screw) IMPLANT
SCREW BONE RSP LOCKING 18MM LG (Screw) ×1 IMPLANT
SCREW BONE RSP LOCKING 5.0X32 (Screw) ×1 IMPLANT
SCREW RETAIN W/HEAD 4MM OFFSET (Shoulder) IMPLANT
SET HNDPC FAN SPRY TIP SCT (DISPOSABLE) ×1 IMPLANT
SLING ARM FOAM STRAP LRG (SOFTGOODS) IMPLANT
SLING ARM IMMOBILIZER LRG (SOFTGOODS) IMPLANT
SLING ARM IMMOBILIZER MED (SOFTGOODS) IMPLANT
STEM HUMERAL SM SHELL SHOU 10 (Miscellaneous) IMPLANT
STRIP CLOSURE SKIN 1/2X4 (GAUZE/BANDAGES/DRESSINGS) ×2 IMPLANT
SUCTION TUBE FRAZIER 10FR DISP (SUCTIONS) IMPLANT
SUPPORT WRAP ARM LG (MISCELLANEOUS) ×1 IMPLANT
SUT ETHIBOND 2 V 37 (SUTURE) IMPLANT
SUT FIBERWIRE #2 38 REV NDL BL (SUTURE)
SUT MNCRL AB 4-0 PS2 18 (SUTURE) ×1 IMPLANT
SUT VIC AB 2-0 CT1 27 (SUTURE) ×2
SUT VIC AB 2-0 CT1 TAPERPNT 27 (SUTURE) ×2 IMPLANT
SUTURE FIBERWR#2 38 REV NDL BL (SUTURE) IMPLANT
TAP SURG THRD DJ 6.5 (ORTHOPEDIC DISPOSABLE SUPPLIES) IMPLANT
TAPE LABRALWHITE 1.5X36 (TAPE) IMPLANT
TAPE SUT LABRALTAP WHT/BLK (SUTURE) IMPLANT
TOWEL OR 17X26 10 PK STRL BLUE (TOWEL DISPOSABLE) ×1 IMPLANT
TOWEL OR NON WOVEN STRL DISP B (DISPOSABLE) ×1 IMPLANT
TUBE SUCTION HIGH CAP CLEAR NV (SUCTIONS) IMPLANT
WATER STERILE IRR 1000ML POUR (IV SOLUTION) ×1 IMPLANT

## 2023-07-19 NOTE — Anesthesia Postprocedure Evaluation (Signed)
Anesthesia Post Note  Patient: Kelly Taylor  Procedure(s) Performed: REVERSE SHOULDER ARTHROPLASTY (Right: Shoulder)     Patient location during evaluation: PACU Anesthesia Type: General Level of consciousness: awake and alert Pain management: pain level controlled Vital Signs Assessment: post-procedure vital signs reviewed and stable Respiratory status: spontaneous breathing, nonlabored ventilation, respiratory function stable and patient connected to nasal cannula oxygen Cardiovascular status: blood pressure returned to baseline and stable Postop Assessment: no apparent nausea or vomiting Anesthetic complications: no   No notable events documented.  Last Vitals:  Vitals:   07/19/23 0956 07/19/23 1015  BP: 108/75 118/76  Pulse: 79 81  Resp:    Temp: 36.9 C   SpO2: 98% 98%    Last Pain:  Vitals:   07/19/23 0956  TempSrc: Oral  PainSc: 0-No pain                 Collene Schlichter

## 2023-07-19 NOTE — Op Note (Signed)
Procedure(s): REVERSE SHOULDER ARTHROPLASTY Procedure Note  Kelly Taylor female 72 y.o. 07/19/2023  Preoperative diagnosis: Right shoulder rotator cuff tear arthropathy  Postoperative diagnosis: Same  Procedure(s) and Anesthesia Type:    * REVERSE SHOULDER ARTHROPLASTY - General   Indications:  72 y.o. female  With endstage right shoulder arthritis with irrepairable rotator cuff tear. Pain and dysfunction interfered with quality of life and nonoperative treatment with activity modification, NSAIDS and injections failed.     Surgeon: Glennon Hamilton   Assistants: Fredia Sorrow PA-C [name] was present and scrubbed throughout the procedure and was essential in positioning, retraction, exposure, and closure)  Anesthesia: General endotracheal anesthesia    Procedure Detail  REVERSE SHOULDER ARTHROPLASTY   Estimated Blood Loss:  200 mL         Drains: none  Blood Given: none          Specimens: none        Complications:  * No complications entered in OR log *         Disposition: PACU - hemodynamically stable.         Condition: stable      OPERATIVE FINDINGS:  A DJO Altivate pressfit reverse total shoulder arthroplasty was placed with a  size 10 stem, a 32-4 glenosphere, and a +4-mm poly insert. The base plate  fixation was excellent.  PROCEDURE: The patient was identified in the preoperative holding area  where I personally marked the operative site after verifying site, side,  and procedure with the patient. An interscalene block given by  the attending anesthesiologist in the holding area and the patient was taken back to the operating room where all extremities were  carefully padded in position after general anesthesia was induced. She  was placed in a beach-chair position and the operative upper extremity was  prepped and draped in a standard sterile fashion. An approximately 10-  cm incision was made from the tip of the coracoid process to the center   point of the humerus at the level of the axilla. Dissection was carried  down through subcutaneous tissues to the level of the cephalic vein  which was taken laterally with the deltoid. The pectoralis major was  retracted medially. The subdeltoid space was developed and the lateral  edge of the conjoined tendon was identified. The undersurface of  conjoined tendon was palpated and the musculocutaneous nerve was not in  the field. Retractor was placed underneath the conjoined and second  retractor was placed lateral into the deltoid. The circumflex humeral  artery and vessels were identified and clamped and coagulated. The  biceps tendon was tenotomized.  The subscapularis was thin and scarified and was taken down as a peel..  The  joint was then gently externally rotated while the capsule was released  from the humeral neck around to just beyond the 6 o'clock position. At  this point, the joint was dislocated and the humeral head was presented  into the wound. The excessive osteophyte formation was removed with a  large rongeur.  The cutting guide was used to make the appropriate  head cut and the head was saved for potentially bone grafting.  The glenoid was exposed with the arm in an  abducted extended position. The anterior and posterior labrum were  completely excised and the capsule was released circumferentially to  allow for exposure of the glenoid for preparation. The 2.5 mm drill was  placed using the guide in 5-10 inferior angulation and the tap  was then advanced in the same hole. Small and large reamers were then used. The tap was then removed and the Metaglene was then screwed in with excellent purchase.  The peripheral guide was then used to drilled measured and filled peripheral locking screws. The size 32-4 glenosphere was then impacted on the [facility] taper and the central screw was placed. The humerus was then again exposed and the diaphyseal reamers were used followed by the  metaphyseal reamers. The final broach was left in place in the proximal trial was placed. The joint was reduced and with this implant it was felt that soft tissue tensioning was appropriate with excellent stability and excellent range of motion. Therefore, final humeral stem was placed press-fit.  And then the trial polyethylene inserts were tested again and the above implant was felt to be the most appropriate for final insertion. The joint was reduced taken through full range of motion and felt to be stable. Soft tissue tension was appropriate.  The joint was then copiously irrigated with pulse  lavage and the wound was then closed. The subscapularis was not repaired.  Skin was closed with 2-0 Vicryl in a deep dermal layer and 4-0  Monocryl for skin closure. Steri-Strips were applied. Sterile  dressings were then applied as well as a sling. The patient was allowed  to awaken from general anesthesia, transferred to stretcher, and taken  to recovery room in stable condition.   POSTOPERATIVE PLAN: The patient will be observed in the recovery room and if her pain is well-controlled with regional anesthesia and she is hemodynamically stable she can be discharged home today with family.

## 2023-07-19 NOTE — Evaluation (Signed)
Occupational Therapy Evaluation Patient Details Name: Kelly Taylor MRN: [id number] DOB: December 29, 1950 Today's Date: 07/19/2023   History of Present Illness 72 yo s/p R reverse TSA. PMH: back pain, DDD, DM,DJD, L shoulder surgery, B knee TKA, R THA, see hx for further.   Clinical Impression   PTA pt modified independent @ cane level. Brother present for education. Written information regarding ADL, sling, positioning, elbow.wrist.hand ROM, shoulder precautions and ice machine reviewed. Pt/brother verbalized understanding. Completed ADL prior to DC to use teach back. Pt states her sister in law plans to help her after DC. All further therapy to be addressed after follow up visit with Cr Chandler. .        If plan is discharge home, recommend the following: A little help with bathing/dressing/bathroom;Assistance with cooking/housework    Functional Status Assessment     Equipment Recommendations  None recommended by OT    Recommendations for Other Services       Precautions / Restrictions Precautions Precautions: Shoulder;Fall Type of Shoulder Precautions: No shoulder ROM; elbow/wrist/hand only Shoulder Interventions: Shoulder sling/immobilizer;At all times;Off for dressing/bathing/exercises Precaution Booklet Issued: Yes (comment)      Mobility Bed Mobility                    Transfers Overall transfer level: Modified independent                        Balance Overall balance assessment: Mild deficits observed, not formally tested                                         ADL either performed or assessed with clinical judgement   ADL Overall ADL's : Needs assistance/impaired                                       General ADL Comments: Educated on compensatory strategies for ADL due to shoulder precautions; handout reveiwed with brother. Assisted pt wtih dressing to use teach back with dressing and management of sling      Vision         Perception         Praxis         Pertinent Vitals/Pain Pain Assessment Pain Assessment: Faces Faces Pain Scale: Hurts a little bit Pain Location: discomfort R axilla Pain Descriptors / Indicators: Discomfort Pain Intervention(s): Limited activity within patient's tolerance     Extremity/Trunk Assessment Upper Extremity Assessment Upper Extremity Assessment: Left hand dominant;RUE deficits/detail RUE Deficits / Details: block active; very little movement in hand RUE:  (blow/wrist/hand PROM WFL)   Lower Extremity Assessment Lower Extremity Assessment: Overall WFL for tasks assessed   Cervical / Trunk Assessment Cervical / Trunk Assessment: Other exceptions (increased body habitus)   Communication Communication Communication: No apparent difficulties   Cognition Arousal: Alert Behavior During Therapy: WFL for tasks assessed/performed Overall Cognitive Status: Within Functional Limits for tasks assessed                                       General Comments  Ice man intructions adn positioning reviewed. Handout provided    Exercises Exercises: General Upper Extremity, Shoulder General Exercises - Upper Extremity  Elbow Flexion: PROM, Right, Seated, 10 reps Elbow Extension: PROM, Right, 10 reps, Seated Wrist Flexion: PROM, Right, 10 reps, Seated Wrist Extension: PROM, Right, 10 reps, Seated Digit Composite Flexion: PROM, AAROM, Right, 10 reps, Seated Composite Extension: PROM, Right, 10 reps, Seated   Shoulder Instructions Shoulder Instructions Donning/doffing shirt without moving shoulder: Caregiver independent with task Method for sponge bathing under operated UE: Caregiver independent with task Donning/doffing sling/immobilizer: Caregiver independent with task Correct positioning of sling/immobilizer: Caregiver independent with task ROM for elbow, wrist and digits of operated UE: Independent Sling wearing schedule (on at  all times/off for ADL's): Patient able to independently direct caregiver Proper positioning of operated UE when showering: Patient able to independently direct caregiver Positioning of UE while sleeping: Patient able to independently direct caregiver    Home Living Family/patient expects to be discharged to:: Private residence Living Arrangements: Alone Available Help at Discharge: Family;Friend(s);Available 24 hours/day Type of Home: House Home Access: Stairs to enter [employer] of Steps: 1 Entrance Stairs-Rails: None Home Layout: Two level;Able to live on main level with bedroom/bathroom     Bathroom Shower/Tub: Tub/shower unit   Bathroom Toilet: Standard Bathroom Accessibility: No   Home Equipment: Agricultural consultant (2 wheels);Cane - single point;Shower seat;Grab bars - tub/shower          Prior Functioning/Environment Prior Level of Function : Independent/Modified Independent             Mobility Comments: uses a cane          OT Problem List: Decreased strength;Decreased range of motion;Impaired balance (sitting and/or standing);Decreased coordination;Decreased knowledge of precautions;Obesity;Impaired UE functional use;Pain      OT Treatment/Interventions:      OT Goals(Current goals can be found in the care plan section) Acute Rehab OT Goals Patient Stated Goal: to go home and recover OT Goal Formulation: All assessment and education complete, DC therapy  OT Frequency:      Co-evaluation              AM-PAC OT "6 Clicks" Daily Activity     Outcome Measure Help from another person eating meals?: A Little Help from another person taking care of personal grooming?: A Little Help from another person toileting, which includes using toliet, bedpan, or urinal?: A Little Help from another person bathing (including washing, rinsing, drying)?: A Lot Help from another person to put on and taking off regular upper body clothing?: A Lot Help from  another person to put on and taking off regular lower body clothing?: A Little 6 Click Score: 16   End of Session Equipment Utilized During Treatment: Gait belt Nurse Communication: Mobility status;Other (comment) (ready for DC)  Activity Tolerance: Patient tolerated treatment well Patient left: in chair;with call bell/phone within reach  OT Visit Diagnosis: Muscle weakness (generalized) (M62.81);Pain Pain - Right/Left: Right Pain - part of body: Shoulder                Time: 1610-9604 OT Time Calculation (min): 38 min Charges:  OT General Charges $OT Visit: 1 Visit OT Evaluation $OT Eval Low Complexity: 1 Low OT Treatments $Self Care/Home Management : 8-22 mins  Luisa Dago, OT/L   Acute OT Clinical Specialist Acute Rehabilitation Services Pager [phone] Office [phone]   [facility] 07/19/2023, 12:32 PM

## 2023-07-19 NOTE — Anesthesia Procedure Notes (Signed)
Procedure Name: Intubation Date/Time: 07/19/2023 7:42 AM  Performed by: [place], Clydene Pugh, CRNAPre-anesthesia Checklist: Patient identified, Emergency Drugs available, Suction available and Patient being monitored Patient Re-evaluated:Patient Re-evaluated prior to induction Oxygen Delivery Method: Circle system utilized Preoxygenation: Pre-oxygenation with 100% oxygen Induction Type: IV induction Ventilation: Mask ventilation without difficulty Laryngoscope Size: Mac and 3 Grade View: Grade I Tube type: Oral Tube size: 7.0 mm Number of attempts: 1 Airway Equipment and Method: Stylet and Oral airway Placement Confirmation: ETT inserted through vocal cords under direct vision, positive ETCO2 and breath sounds checked- equal and bilateral Secured at: 22 cm Tube secured with: Tape Dental Injury: Teeth and Oropharynx as per pre-operative assessment

## 2023-07-19 NOTE — H&P (Signed)
Kelly Taylor is an 72 y.o. female.   Chief Complaint: R shoulder pain and dysfunction HPI: R shoulder rotator cuff tear arthropathy, failed conservative treatment.  Past Medical History:  Diagnosis Date   Anemia    hx of in 20's   Aortic atherosclerosis (HCC)    Arthritis    Ascending aorta dilatation (HCC)    42mm by CT 10/23   Asthma    Back pain    Breast mass, left 2003   Chronic back pain    seeing Pain specialist   Complication of anesthesia    "shallow breathing with general"   Constipation    DDD (degenerative disc disease)    Depression    Diabetes mellitus    borderline   DJD (degenerative joint disease)    Family history of heart attack    GERD (gastroesophageal reflux disease)    H/O scarlet fever    Hemorrhoids    History of chicken pox    History of measles, mumps, or rubella as child   all 3   Hx of bladder infections    Hypothyroidism    Irritable bowel syndrome    Joint pain    Lumbar stenosis    RFA done May 2015   Menopausal symptoms 2003   Obesity, morbid (HCC) 2006   Pericarditis    hx of   Pneumonia    hx of 5 years ago   Polyp of colon    removed during colonoscopy   SOBOE (shortness of breath on exertion)    Spinal headache    x1   Thyroid condition    Trichomonas    Urge incontinence 2005   Vertigo    over a year was last episode   Yeast in stool     Past Surgical History:  Procedure Laterality Date   ABDOMINAL HYSTERECTOMY  02/1990   APPENDECTOMY  age 89   BLADDER SUSPENSION     BREAST LUMPECTOMY Left 2003   BREAST REDUCTION SURGERY  1992   CHOLECYSTECTOMY  2010   COLONOSCOPY  may 2015   4 polyps-benign   DILATION AND CURETTAGE OF UTERUS     FOOT SURGERY Right    pinched nerve   HAND SURGERY Right    right-Dr. Amanda Pea   HERNIA REPAIR Right 1992   INTERSTIM IMPLANT PLACEMENT Right 2003   for incontinence   KNEE SURGERY     bilateral knee; x3 right/ x2 left   REVERSE SHOULDER ARTHROPLASTY Left 04/19/2017   Procedure:  REVERSE SHOULDER ARTHROPLASTY;  Surgeon: Stoneham Broom, MD;  Location: [facility] OR;  Service: Orthopedics;  Laterality: Left;  REVERSE SHOULDER ARTHROPLASTY   ROTATOR CUFF REPAIR  2008   right, almost complete reconstruction, bone anchors   TONSILLECTOMY  age 33   TOTAL HIP ARTHROPLASTY Right 07/30/2014   Procedure: RIGHT TOTAL HIP ARTHROPLASTY ANTERIOR APPROACH;  Surgeon: Kathryne Hitch, MD;  Location: [facility] ORS;  Service: Orthopedics;  Laterality: Right;   TOTAL KNEE ARTHROPLASTY Right 04/07/2013   Procedure: RIGHT TOTAL KNEE ARTHROPLASTY;  Surgeon: Loanne Drilling, MD;  Location: [facility] OR[facility];  Service: Orthopedics;  Laterality: Right;   TOTAL KNEE ARTHROPLASTY Left 09/22/2013   Procedure: LEFT TOTAL KNEE ARTHROPLASTY;  Surgeon: Loanne Drilling, MD;  Location: [facility] OR[facility];  Service: Orthopedics;  Laterality: Left;    Family History  Problem Relation Age of Onset   Cancer Mother    Hypertension Mother    Diabetes Mother    Lung cancer Mother  Heart disease Mother    Obesity Mother    Depression Father    Stroke Paternal Grandfather    Ovarian cancer Maternal Aunt    Breast cancer Maternal Aunt    Social History:  reports that she has never smoked. She has never used smokeless tobacco. She reports current alcohol use. She reports that she does not use drugs.  Allergies:  Allergies  Allergen Reactions   Oxycontin [Oxycodone] Shortness Of Breath and Other (See Comments)    "Turned purple"  Can take vicodin/morphine   Penicillins Anaphylaxis and Hives    Pt was a baby when this happened Has patient had a PCN reaction causing immediate rash, facial/tongue/throat swelling, SOB or lightheadedness with hypotension: Yes Has patient had a PCN reaction causing severe rash involving mucus membranes or skin necrosis: Unknown Has patient had a PCN reaction that required hospitalization: Unknown Has patient had a PCN reaction occurring within the last 10 years: No If all of the above answers are  "NO", then may proceed with Cephalosporin use.   Trazodone Other (See Comments)    Nightmares   Lyrica [Pregabalin] Other (See Comments)    caused nightmares   Relafen [Nabumetone] Nausea And Vomiting   Sulfa Antibiotics Other (See Comments)    fever    Medications Prior to Admission  Medication Sig Dispense Refill   albuterol (PROVENTIL HFA;VENTOLIN HFA) 108 (90 BASE) MCG/ACT inhaler Inhale 2 puffs into the lungs every 4 (four) hours as needed for wheezing or shortness of breath. 1 Inhaler 0   Calcium Carbonate Antacid (CALCIUM CARBONATE PO) Take 1,500 mg by mouth every evening.     docusate sodium (COLACE) 100 MG capsule Take 100-200 mg by mouth at bedtime.     gabapentin (NEURONTIN) 100 MG capsule Take 100 mg by mouth 3 (three) times daily as needed (pain).     levothyroxine (SYNTHROID) 75 MCG tablet Take 75 mcg by mouth daily before breakfast.     magnesium oxide (MAG-OX) 400 (240 Mg) MG tablet Take 400 mg by mouth every evening.     meclizine (ANTIVERT) 25 MG tablet Take 25 mg by mouth in the morning.     meloxicam (MOBIC) 15 MG tablet Take 15 mg by mouth in the morning.     metFORMIN (GLUCOPHAGE-XR) 500 MG 24 hr tablet Take 1,000 mg by mouth in the morning and at bedtime.     Misc Natural Products (FLEX-A-MIN JOINT FLEX PO) Take 1 tablet by mouth in the morning. Instaflex Advanced Joint Support     Misc Natural Products (LEG VEIN & CIRCULATION PO) Take 2 capsules by mouth daily with lunch.     Multiple Vitamins-Minerals (HAIR SKIN NAILS PO) Take 2 capsules by mouth every evening. Ultimate Hair Strength Supplement     Multiple Vitamins-Minerals (MULTIVITAMIN WITH MINERALS) tablet Take 1 tablet by mouth in the morning. Women's Multivitamin     Omega-3 Fatty Acids (FISH OIL PO) Take 2 capsules by mouth daily after breakfast.     omeprazole (PRILOSEC) 20 MG capsule Take 20 mg by mouth daily before breakfast.  1   polyethylene glycol (MIRALAX / GLYCOLAX) packet Take 17 g by mouth daily.      Probiotic Product (PROBIOTIC PO) Take 1 capsule by mouth every evening.     simvastatin (ZOCOR) 10 MG tablet TAKE 1 TABLET BY MOUTH IN THE EVENING EVERY DAY  1   solifenacin (VESICARE) 10 MG tablet Take 10 mg by mouth in the morning.     traMADol Janean Sark)  50 MG tablet Take 50 mg by mouth 3 (three) times daily as needed (pain.).     cyclobenzaprine (FLEXERIL) 10 MG tablet Take 10 mg by mouth 2 (two) times daily as needed for muscle spasms.     diclofenac sodium (VOLTAREN) 1 % GEL Apply 1 Application topically 4 (four) times daily as needed (pain.).      Results for orders placed or performed during the hospital encounter of 07/19/23 (from the past 48 hour(s))  Glucose, capillary     Status: Abnormal   Collection Time: 07/19/23  6:00 AM  Result Value Ref Range   Glucose-Capillary 163 (H) 70 - 99 mg/dL    Comment: Glucose reference range applies only to samples taken after fasting for at least 8 hours.   Comment 1 Notify RN    Comment 2 Document in Chart    No results found.  Review of Systems  All other systems reviewed and are negative.   Blood pressure 128/82, pulse 91, temperature 98.9 F (37.2 C), temperature source Oral, resp. rate 14, height 5\' 7"  (1.702 m), weight 133 kg, SpO2 92%. Physical Exam HENT:     Head: Atraumatic.  Eyes:     Extraocular Movements: Extraocular movements intact.  Cardiovascular:     Pulses: Normal pulses.  Pulmonary:     Effort: Pulmonary effort is normal.  Musculoskeletal:     Comments: R shoulder pain with limited ROM. NVID  Neurological:     Mental Status: She is alert.      Assessment/Plan R shoulder rotator cuff tear arthropathy, failed conservative treatment. Plan R reverse TSA Risks / benefits of surgery discussed Consent on chart  NPO for OR Preop antibiotics   Glennon Hamilton, MD 07/19/2023, 6:44 AM

## 2023-07-19 NOTE — Anesthesia Procedure Notes (Signed)
Anesthesia Regional Block: Interscalene brachial plexus block   Pre-Anesthetic Checklist: , timeout performed,  Correct Patient, Correct Site, Correct Laterality,  Correct Procedure, Correct Position, site marked,  Risks and benefits discussed,  Surgical consent,  Pre-op evaluation,  At surgeon's request and post-op pain management  Laterality: Right  Prep: chloraprep       Needles:  Injection technique: Single-shot  Needle Type: Echogenic Stimulator Needle     Needle Length: 9cm  Needle Gauge: 22     Additional Needles:   Procedures:,,,, ultrasound used (permanent image in chart),,    Narrative:  Start time: 07/19/2023 6:50 AM End time: 07/19/2023 6:57 AM Injection made incrementally with aspirations every 5 mL.  Performed by: Personally  Anesthesiologist: Collene Schlichter, MD  Additional Notes: Functioning IV was confirmed and monitors were applied.  A 90mm 22ga echogenic stimulator needle was used. Sterile prep and drape, hand hygiene, and sterile gloves were used.  Negative aspiration and negative test dose prior to incremental administration of local anesthetic. The patient tolerated the procedure well.  Ultrasound guidance: relevent anatomy identified, needle position confirmed, local anesthetic spread visualized around nerve(s), vascular puncture avoided.  Image printed for medical record.

## 2023-07-19 NOTE — Discharge Instructions (Addendum)
Discharge Instructions after Reverse Total Shoulder Arthroplasty   A sling has been provided for you. You are to wear this at all times (except for bathing and dressing), until your first post operative visit with Dr. Ave Filter. Please also wear while sleeping at night. While you bath and dress, let the arm/elbow extend straight down to stretch your elbow. Wiggle your fingers and pump your first while your in the sling to prevent hand swelling. Use ice on the shoulder intermittently over the first 48 hours after surgery. Continue to use ice or and ice machine as needed after 48 hours for pain control/swelling.  Pain medicine has been prescribed for you. You can take hydromorphone for severe pain or use Tramadol for milder pain but do NOT take both medications together Use your medicine liberally over the first 48 hours, and then you can begin to taper your use. You may take Extra Strength Tylenol or Tylenol only in place of the pain pills. DO NOT take ANY nonsteroidal anti-inflammatory pain medications: Advil, Motrin, Ibuprofen, Aleve, Naproxen or Naprosyn.  Take one aspirin 81mg  a day for 2 weeks after surgery, unless you have an aspirin sensitivity/allergy or asthma.  Leave your dressing on until your first follow up visit.  You may shower with the dressing.  Hold your arm as if you still have your sling on while you shower. Simply allow the water to wash over the site and then pat dry. Make sure your axilla (armpit) is completely dry after showering.    Please call [phone] during normal business hours or [phone] after hours for any problems. Including the following:  - excessive redness of the incisions - drainage for more than 4 days - fever of more than 101.5 F  *Please note that pain medications will not be refilled after hours or on weekends.  Dental Antibiotics:  In most cases prophylactic antibiotics for Dental procdeures after total joint surgery are not  necessary.  Exceptions are as follows:  1. History of prior total joint infection  2. Severely immunocompromised (Organ Transplant, cancer chemotherapy, Rheumatoid biologic meds such as Humera)  3. Poorly controlled diabetes (A1C &gt; 8.0, blood glucose over 200)  If you have one of these conditions, contact your surgeon for an antibiotic prescription, prior to your dental procedure.

## 2023-07-19 NOTE — Transfer of Care (Signed)
Immediate Anesthesia Transfer of Care Note  Patient: Kelly Taylor  Procedure(s) Performed: REVERSE SHOULDER ARTHROPLASTY (Right: Shoulder)  Patient Location: PACU  Anesthesia Type:General and GA combined with regional for post-op pain  Level of Consciousness: awake, alert , and oriented  Airway & Oxygen Therapy: Patient Spontanous Breathing and Patient connected to face mask oxygen  Post-op Assessment: Report given to RN and Post -op Vital signs reviewed and stable  Post vital signs: Reviewed and stable  Last Vitals:  Vitals Value Taken Time  BP 146/79 07/19/23 0902  Temp    Pulse 65 07/19/23 0904  Resp 18 07/19/23 0904  SpO2 94 % 07/19/23 0904  Vitals shown include unfiled device data.  Last Pain:  Vitals:   07/19/23 0612  TempSrc:   PainSc: 4          Complications: No notable events documented.

## 2023-07-25 ENCOUNTER — Encounter (HOSPITAL_COMMUNITY): Payer: Self-pay | Admitting: Orthopedic Surgery

## 2023-08-03 DIAGNOSIS — M19011 Primary osteoarthritis, right shoulder: Secondary | ICD-10-CM | POA: Diagnosis not present

## 2023-08-03 DIAGNOSIS — M25611 Stiffness of right shoulder, not elsewhere classified: Secondary | ICD-10-CM | POA: Diagnosis not present

## 2023-08-03 DIAGNOSIS — Z96611 Presence of right artificial shoulder joint: Secondary | ICD-10-CM | POA: Diagnosis not present

## 2023-08-06 DIAGNOSIS — M7989 Other specified soft tissue disorders: Secondary | ICD-10-CM | POA: Diagnosis not present

## 2023-08-06 DIAGNOSIS — E118 Type 2 diabetes mellitus with unspecified complications: Secondary | ICD-10-CM | POA: Diagnosis not present

## 2023-08-06 DIAGNOSIS — Z96611 Presence of right artificial shoulder joint: Secondary | ICD-10-CM | POA: Diagnosis not present

## 2023-08-06 DIAGNOSIS — M25611 Stiffness of right shoulder, not elsewhere classified: Secondary | ICD-10-CM | POA: Diagnosis not present

## 2023-08-08 DIAGNOSIS — Z96611 Presence of right artificial shoulder joint: Secondary | ICD-10-CM | POA: Diagnosis not present

## 2023-08-08 DIAGNOSIS — M25611 Stiffness of right shoulder, not elsewhere classified: Secondary | ICD-10-CM | POA: Diagnosis not present

## 2023-08-13 DIAGNOSIS — M25611 Stiffness of right shoulder, not elsewhere classified: Secondary | ICD-10-CM | POA: Diagnosis not present

## 2023-08-13 DIAGNOSIS — Z96611 Presence of right artificial shoulder joint: Secondary | ICD-10-CM | POA: Diagnosis not present

## 2023-08-16 DIAGNOSIS — M25611 Stiffness of right shoulder, not elsewhere classified: Secondary | ICD-10-CM | POA: Diagnosis not present

## 2023-08-16 DIAGNOSIS — Z96611 Presence of right artificial shoulder joint: Secondary | ICD-10-CM | POA: Diagnosis not present

## 2023-08-20 DIAGNOSIS — Z96611 Presence of right artificial shoulder joint: Secondary | ICD-10-CM | POA: Diagnosis not present

## 2023-08-20 DIAGNOSIS — M25611 Stiffness of right shoulder, not elsewhere classified: Secondary | ICD-10-CM | POA: Diagnosis not present

## 2023-08-23 DIAGNOSIS — Z96611 Presence of right artificial shoulder joint: Secondary | ICD-10-CM | POA: Diagnosis not present

## 2023-08-23 DIAGNOSIS — M25611 Stiffness of right shoulder, not elsewhere classified: Secondary | ICD-10-CM | POA: Diagnosis not present

## 2023-08-27 DIAGNOSIS — Z96611 Presence of right artificial shoulder joint: Secondary | ICD-10-CM | POA: Diagnosis not present

## 2023-08-27 DIAGNOSIS — M25611 Stiffness of right shoulder, not elsewhere classified: Secondary | ICD-10-CM | POA: Diagnosis not present

## 2023-08-30 DIAGNOSIS — Z96611 Presence of right artificial shoulder joint: Secondary | ICD-10-CM | POA: Diagnosis not present

## 2023-08-30 DIAGNOSIS — M25611 Stiffness of right shoulder, not elsewhere classified: Secondary | ICD-10-CM | POA: Diagnosis not present

## 2023-09-03 DIAGNOSIS — Z96611 Presence of right artificial shoulder joint: Secondary | ICD-10-CM | POA: Diagnosis not present

## 2023-09-03 DIAGNOSIS — M25611 Stiffness of right shoulder, not elsewhere classified: Secondary | ICD-10-CM | POA: Diagnosis not present

## 2023-09-05 DIAGNOSIS — M25611 Stiffness of right shoulder, not elsewhere classified: Secondary | ICD-10-CM | POA: Diagnosis not present

## 2023-09-05 DIAGNOSIS — Z96611 Presence of right artificial shoulder joint: Secondary | ICD-10-CM | POA: Diagnosis not present

## 2023-09-11 DIAGNOSIS — Z96611 Presence of right artificial shoulder joint: Secondary | ICD-10-CM | POA: Diagnosis not present

## 2023-09-11 DIAGNOSIS — M25611 Stiffness of right shoulder, not elsewhere classified: Secondary | ICD-10-CM | POA: Diagnosis not present

## 2023-09-13 DIAGNOSIS — M25611 Stiffness of right shoulder, not elsewhere classified: Secondary | ICD-10-CM | POA: Diagnosis not present

## 2023-09-13 DIAGNOSIS — Z96611 Presence of right artificial shoulder joint: Secondary | ICD-10-CM | POA: Diagnosis not present

## 2023-09-18 DIAGNOSIS — Z96611 Presence of right artificial shoulder joint: Secondary | ICD-10-CM | POA: Diagnosis not present

## 2023-09-18 DIAGNOSIS — M25611 Stiffness of right shoulder, not elsewhere classified: Secondary | ICD-10-CM | POA: Diagnosis not present

## 2023-09-20 DIAGNOSIS — M25611 Stiffness of right shoulder, not elsewhere classified: Secondary | ICD-10-CM | POA: Diagnosis not present

## 2023-09-20 DIAGNOSIS — Z96611 Presence of right artificial shoulder joint: Secondary | ICD-10-CM | POA: Diagnosis not present

## 2023-09-24 DIAGNOSIS — M9901 Segmental and somatic dysfunction of cervical region: Secondary | ICD-10-CM | POA: Diagnosis not present

## 2023-09-24 DIAGNOSIS — N760 Acute vaginitis: Secondary | ICD-10-CM | POA: Diagnosis not present

## 2023-09-24 DIAGNOSIS — M9903 Segmental and somatic dysfunction of lumbar region: Secondary | ICD-10-CM | POA: Diagnosis not present

## 2023-09-24 DIAGNOSIS — L293 Anogenital pruritus, unspecified: Secondary | ICD-10-CM | POA: Diagnosis not present

## 2023-09-24 DIAGNOSIS — M546 Pain in thoracic spine: Secondary | ICD-10-CM | POA: Diagnosis not present

## 2023-09-24 DIAGNOSIS — M542 Cervicalgia: Secondary | ICD-10-CM | POA: Diagnosis not present

## 2023-09-24 DIAGNOSIS — M9902 Segmental and somatic dysfunction of thoracic region: Secondary | ICD-10-CM | POA: Diagnosis not present

## 2023-09-24 DIAGNOSIS — M545 Low back pain, unspecified: Secondary | ICD-10-CM | POA: Diagnosis not present

## 2023-09-24 DIAGNOSIS — B356 Tinea cruris: Secondary | ICD-10-CM | POA: Diagnosis not present

## 2023-09-25 DIAGNOSIS — Z96611 Presence of right artificial shoulder joint: Secondary | ICD-10-CM | POA: Diagnosis not present

## 2023-09-25 DIAGNOSIS — M25611 Stiffness of right shoulder, not elsewhere classified: Secondary | ICD-10-CM | POA: Diagnosis not present

## 2023-09-25 DIAGNOSIS — N390 Urinary tract infection, site not specified: Secondary | ICD-10-CM | POA: Diagnosis not present

## 2023-09-26 DIAGNOSIS — M9903 Segmental and somatic dysfunction of lumbar region: Secondary | ICD-10-CM | POA: Diagnosis not present

## 2023-09-26 DIAGNOSIS — M545 Low back pain, unspecified: Secondary | ICD-10-CM | POA: Diagnosis not present

## 2023-09-26 DIAGNOSIS — M9902 Segmental and somatic dysfunction of thoracic region: Secondary | ICD-10-CM | POA: Diagnosis not present

## 2023-09-26 DIAGNOSIS — M9901 Segmental and somatic dysfunction of cervical region: Secondary | ICD-10-CM | POA: Diagnosis not present

## 2023-09-26 DIAGNOSIS — M542 Cervicalgia: Secondary | ICD-10-CM | POA: Diagnosis not present

## 2023-09-26 DIAGNOSIS — M546 Pain in thoracic spine: Secondary | ICD-10-CM | POA: Diagnosis not present

## 2023-09-27 DIAGNOSIS — M9902 Segmental and somatic dysfunction of thoracic region: Secondary | ICD-10-CM | POA: Diagnosis not present

## 2023-09-27 DIAGNOSIS — M542 Cervicalgia: Secondary | ICD-10-CM | POA: Diagnosis not present

## 2023-09-27 DIAGNOSIS — M9901 Segmental and somatic dysfunction of cervical region: Secondary | ICD-10-CM | POA: Diagnosis not present

## 2023-09-27 DIAGNOSIS — M545 Low back pain, unspecified: Secondary | ICD-10-CM | POA: Diagnosis not present

## 2023-09-27 DIAGNOSIS — Z96611 Presence of right artificial shoulder joint: Secondary | ICD-10-CM | POA: Diagnosis not present

## 2023-09-27 DIAGNOSIS — M25611 Stiffness of right shoulder, not elsewhere classified: Secondary | ICD-10-CM | POA: Diagnosis not present

## 2023-09-27 DIAGNOSIS — M546 Pain in thoracic spine: Secondary | ICD-10-CM | POA: Diagnosis not present

## 2023-09-27 DIAGNOSIS — M9903 Segmental and somatic dysfunction of lumbar region: Secondary | ICD-10-CM | POA: Diagnosis not present

## 2023-10-01 DIAGNOSIS — E785 Hyperlipidemia, unspecified: Secondary | ICD-10-CM | POA: Diagnosis not present

## 2023-10-01 DIAGNOSIS — E039 Hypothyroidism, unspecified: Secondary | ICD-10-CM | POA: Diagnosis not present

## 2023-10-01 DIAGNOSIS — M9903 Segmental and somatic dysfunction of lumbar region: Secondary | ICD-10-CM | POA: Diagnosis not present

## 2023-10-01 DIAGNOSIS — M545 Low back pain, unspecified: Secondary | ICD-10-CM | POA: Diagnosis not present

## 2023-10-01 DIAGNOSIS — M546 Pain in thoracic spine: Secondary | ICD-10-CM | POA: Diagnosis not present

## 2023-10-01 DIAGNOSIS — E118 Type 2 diabetes mellitus with unspecified complications: Secondary | ICD-10-CM | POA: Diagnosis not present

## 2023-10-01 DIAGNOSIS — M9902 Segmental and somatic dysfunction of thoracic region: Secondary | ICD-10-CM | POA: Diagnosis not present

## 2023-10-01 DIAGNOSIS — M542 Cervicalgia: Secondary | ICD-10-CM | POA: Diagnosis not present

## 2023-10-01 DIAGNOSIS — M9901 Segmental and somatic dysfunction of cervical region: Secondary | ICD-10-CM | POA: Diagnosis not present

## 2023-10-01 DIAGNOSIS — Z79899 Other long term (current) drug therapy: Secondary | ICD-10-CM | POA: Diagnosis not present

## 2023-10-02 DIAGNOSIS — E039 Hypothyroidism, unspecified: Secondary | ICD-10-CM | POA: Diagnosis not present

## 2023-10-02 DIAGNOSIS — E118 Type 2 diabetes mellitus with unspecified complications: Secondary | ICD-10-CM | POA: Diagnosis not present

## 2023-10-02 DIAGNOSIS — E785 Hyperlipidemia, unspecified: Secondary | ICD-10-CM | POA: Diagnosis not present

## 2023-10-02 DIAGNOSIS — Z79899 Other long term (current) drug therapy: Secondary | ICD-10-CM | POA: Diagnosis not present

## 2023-10-03 DIAGNOSIS — Z96611 Presence of right artificial shoulder joint: Secondary | ICD-10-CM | POA: Diagnosis not present

## 2023-10-03 DIAGNOSIS — M9901 Segmental and somatic dysfunction of cervical region: Secondary | ICD-10-CM | POA: Diagnosis not present

## 2023-10-03 DIAGNOSIS — M9903 Segmental and somatic dysfunction of lumbar region: Secondary | ICD-10-CM | POA: Diagnosis not present

## 2023-10-03 DIAGNOSIS — M542 Cervicalgia: Secondary | ICD-10-CM | POA: Diagnosis not present

## 2023-10-03 DIAGNOSIS — M545 Low back pain, unspecified: Secondary | ICD-10-CM | POA: Diagnosis not present

## 2023-10-03 DIAGNOSIS — M25611 Stiffness of right shoulder, not elsewhere classified: Secondary | ICD-10-CM | POA: Diagnosis not present

## 2023-10-03 DIAGNOSIS — M9902 Segmental and somatic dysfunction of thoracic region: Secondary | ICD-10-CM | POA: Diagnosis not present

## 2023-10-03 DIAGNOSIS — M546 Pain in thoracic spine: Secondary | ICD-10-CM | POA: Diagnosis not present

## 2023-10-08 DIAGNOSIS — E118 Type 2 diabetes mellitus with unspecified complications: Secondary | ICD-10-CM | POA: Diagnosis not present

## 2023-10-08 DIAGNOSIS — E039 Hypothyroidism, unspecified: Secondary | ICD-10-CM | POA: Diagnosis not present

## 2023-10-08 DIAGNOSIS — K219 Gastro-esophageal reflux disease without esophagitis: Secondary | ICD-10-CM | POA: Diagnosis not present

## 2023-10-08 DIAGNOSIS — E785 Hyperlipidemia, unspecified: Secondary | ICD-10-CM | POA: Diagnosis not present

## 2023-10-08 DIAGNOSIS — R0989 Other specified symptoms and signs involving the circulatory and respiratory systems: Secondary | ICD-10-CM | POA: Diagnosis not present

## 2023-10-08 DIAGNOSIS — M5126 Other intervertebral disc displacement, lumbar region: Secondary | ICD-10-CM | POA: Diagnosis not present

## 2023-10-09 DIAGNOSIS — B356 Tinea cruris: Secondary | ICD-10-CM | POA: Diagnosis not present

## 2023-10-10 DIAGNOSIS — M25611 Stiffness of right shoulder, not elsewhere classified: Secondary | ICD-10-CM | POA: Diagnosis not present

## 2023-10-10 DIAGNOSIS — M9901 Segmental and somatic dysfunction of cervical region: Secondary | ICD-10-CM | POA: Diagnosis not present

## 2023-10-10 DIAGNOSIS — M9902 Segmental and somatic dysfunction of thoracic region: Secondary | ICD-10-CM | POA: Diagnosis not present

## 2023-10-10 DIAGNOSIS — M545 Low back pain, unspecified: Secondary | ICD-10-CM | POA: Diagnosis not present

## 2023-10-10 DIAGNOSIS — M9903 Segmental and somatic dysfunction of lumbar region: Secondary | ICD-10-CM | POA: Diagnosis not present

## 2023-10-10 DIAGNOSIS — M546 Pain in thoracic spine: Secondary | ICD-10-CM | POA: Diagnosis not present

## 2023-10-10 DIAGNOSIS — M542 Cervicalgia: Secondary | ICD-10-CM | POA: Diagnosis not present

## 2023-10-10 DIAGNOSIS — Z96611 Presence of right artificial shoulder joint: Secondary | ICD-10-CM | POA: Diagnosis not present

## 2023-10-11 DIAGNOSIS — M9902 Segmental and somatic dysfunction of thoracic region: Secondary | ICD-10-CM | POA: Diagnosis not present

## 2023-10-11 DIAGNOSIS — M542 Cervicalgia: Secondary | ICD-10-CM | POA: Diagnosis not present

## 2023-10-11 DIAGNOSIS — M545 Low back pain, unspecified: Secondary | ICD-10-CM | POA: Diagnosis not present

## 2023-10-11 DIAGNOSIS — M546 Pain in thoracic spine: Secondary | ICD-10-CM | POA: Diagnosis not present

## 2023-10-11 DIAGNOSIS — M9903 Segmental and somatic dysfunction of lumbar region: Secondary | ICD-10-CM | POA: Diagnosis not present

## 2023-10-11 DIAGNOSIS — M9901 Segmental and somatic dysfunction of cervical region: Secondary | ICD-10-CM | POA: Diagnosis not present

## 2023-10-15 DIAGNOSIS — M25611 Stiffness of right shoulder, not elsewhere classified: Secondary | ICD-10-CM | POA: Diagnosis not present

## 2023-10-15 DIAGNOSIS — M9903 Segmental and somatic dysfunction of lumbar region: Secondary | ICD-10-CM | POA: Diagnosis not present

## 2023-10-15 DIAGNOSIS — M9902 Segmental and somatic dysfunction of thoracic region: Secondary | ICD-10-CM | POA: Diagnosis not present

## 2023-10-15 DIAGNOSIS — M546 Pain in thoracic spine: Secondary | ICD-10-CM | POA: Diagnosis not present

## 2023-10-15 DIAGNOSIS — M545 Low back pain, unspecified: Secondary | ICD-10-CM | POA: Diagnosis not present

## 2023-10-15 DIAGNOSIS — M9901 Segmental and somatic dysfunction of cervical region: Secondary | ICD-10-CM | POA: Diagnosis not present

## 2023-10-15 DIAGNOSIS — Z96611 Presence of right artificial shoulder joint: Secondary | ICD-10-CM | POA: Diagnosis not present

## 2023-10-15 DIAGNOSIS — M542 Cervicalgia: Secondary | ICD-10-CM | POA: Diagnosis not present

## 2023-10-17 DIAGNOSIS — M9903 Segmental and somatic dysfunction of lumbar region: Secondary | ICD-10-CM | POA: Diagnosis not present

## 2023-10-17 DIAGNOSIS — M9902 Segmental and somatic dysfunction of thoracic region: Secondary | ICD-10-CM | POA: Diagnosis not present

## 2023-10-17 DIAGNOSIS — Z96611 Presence of right artificial shoulder joint: Secondary | ICD-10-CM | POA: Diagnosis not present

## 2023-10-17 DIAGNOSIS — M9901 Segmental and somatic dysfunction of cervical region: Secondary | ICD-10-CM | POA: Diagnosis not present

## 2023-10-17 DIAGNOSIS — M546 Pain in thoracic spine: Secondary | ICD-10-CM | POA: Diagnosis not present

## 2023-10-17 DIAGNOSIS — M25611 Stiffness of right shoulder, not elsewhere classified: Secondary | ICD-10-CM | POA: Diagnosis not present

## 2023-10-17 DIAGNOSIS — M542 Cervicalgia: Secondary | ICD-10-CM | POA: Diagnosis not present

## 2023-10-17 DIAGNOSIS — M545 Low back pain, unspecified: Secondary | ICD-10-CM | POA: Diagnosis not present

## 2023-10-23 DIAGNOSIS — M542 Cervicalgia: Secondary | ICD-10-CM | POA: Diagnosis not present

## 2023-10-23 DIAGNOSIS — M545 Low back pain, unspecified: Secondary | ICD-10-CM | POA: Diagnosis not present

## 2023-10-23 DIAGNOSIS — M9903 Segmental and somatic dysfunction of lumbar region: Secondary | ICD-10-CM | POA: Diagnosis not present

## 2023-10-23 DIAGNOSIS — M9902 Segmental and somatic dysfunction of thoracic region: Secondary | ICD-10-CM | POA: Diagnosis not present

## 2023-10-23 DIAGNOSIS — M546 Pain in thoracic spine: Secondary | ICD-10-CM | POA: Diagnosis not present

## 2023-10-23 DIAGNOSIS — M9901 Segmental and somatic dysfunction of cervical region: Secondary | ICD-10-CM | POA: Diagnosis not present

## 2023-11-08 DIAGNOSIS — E039 Hypothyroidism, unspecified: Secondary | ICD-10-CM | POA: Diagnosis not present

## 2023-11-12 DIAGNOSIS — M7918 Myalgia, other site: Secondary | ICD-10-CM | POA: Diagnosis not present

## 2023-11-12 DIAGNOSIS — M47816 Spondylosis without myelopathy or radiculopathy, lumbar region: Secondary | ICD-10-CM | POA: Diagnosis not present

## 2023-11-12 DIAGNOSIS — M5106 Intervertebral disc disorders with myelopathy, lumbar region: Secondary | ICD-10-CM | POA: Diagnosis not present

## 2023-11-12 DIAGNOSIS — M5417 Radiculopathy, lumbosacral region: Secondary | ICD-10-CM | POA: Diagnosis not present

## 2023-11-15 DIAGNOSIS — N3941 Urge incontinence: Secondary | ICD-10-CM | POA: Diagnosis not present

## 2024-01-28 ENCOUNTER — Ambulatory Visit: Payer: Medicare Other | Admitting: Podiatry

## 2024-01-29 DIAGNOSIS — E118 Type 2 diabetes mellitus with unspecified complications: Secondary | ICD-10-CM | POA: Diagnosis not present

## 2024-01-29 DIAGNOSIS — E039 Hypothyroidism, unspecified: Secondary | ICD-10-CM | POA: Diagnosis not present

## 2024-02-05 DIAGNOSIS — E118 Type 2 diabetes mellitus with unspecified complications: Secondary | ICD-10-CM | POA: Diagnosis not present

## 2024-02-05 DIAGNOSIS — E039 Hypothyroidism, unspecified: Secondary | ICD-10-CM | POA: Diagnosis not present

## 2024-02-05 DIAGNOSIS — M159 Polyosteoarthritis, unspecified: Secondary | ICD-10-CM | POA: Diagnosis not present

## 2024-02-05 DIAGNOSIS — Z0000 Encounter for general adult medical examination without abnormal findings: Secondary | ICD-10-CM | POA: Diagnosis not present

## 2024-02-05 DIAGNOSIS — K219 Gastro-esophageal reflux disease without esophagitis: Secondary | ICD-10-CM | POA: Diagnosis not present

## 2024-02-05 DIAGNOSIS — Z23 Encounter for immunization: Secondary | ICD-10-CM | POA: Diagnosis not present

## 2024-02-05 DIAGNOSIS — M5126 Other intervertebral disc displacement, lumbar region: Secondary | ICD-10-CM | POA: Diagnosis not present

## 2024-02-05 DIAGNOSIS — E785 Hyperlipidemia, unspecified: Secondary | ICD-10-CM | POA: Diagnosis not present

## 2024-02-28 DIAGNOSIS — Z79899 Other long term (current) drug therapy: Secondary | ICD-10-CM | POA: Diagnosis not present

## 2024-02-28 DIAGNOSIS — M5106 Intervertebral disc disorders with myelopathy, lumbar region: Secondary | ICD-10-CM | POA: Diagnosis not present

## 2024-02-28 DIAGNOSIS — M5417 Radiculopathy, lumbosacral region: Secondary | ICD-10-CM | POA: Diagnosis not present

## 2024-02-28 DIAGNOSIS — M7918 Myalgia, other site: Secondary | ICD-10-CM | POA: Diagnosis not present

## 2024-02-28 DIAGNOSIS — G894 Chronic pain syndrome: Secondary | ICD-10-CM | POA: Diagnosis not present

## 2024-02-28 DIAGNOSIS — Z79891 Long term (current) use of opiate analgesic: Secondary | ICD-10-CM | POA: Diagnosis not present

## 2024-03-11 DIAGNOSIS — N3941 Urge incontinence: Secondary | ICD-10-CM | POA: Diagnosis not present

## 2024-05-04 ENCOUNTER — Encounter (HOSPITAL_COMMUNITY): Payer: Self-pay

## 2024-05-04 ENCOUNTER — Emergency Department (HOSPITAL_COMMUNITY)

## 2024-05-04 ENCOUNTER — Other Ambulatory Visit: Payer: Self-pay

## 2024-05-04 ENCOUNTER — Emergency Department (HOSPITAL_COMMUNITY)
Admission: EM | Admit: 2024-05-04 | Discharge: 2024-05-05 | Disposition: A | Discharge status: Home / Self Care | Attending: Emergency Medicine | Admitting: Emergency Medicine

## 2024-05-04 DIAGNOSIS — Z043 Encounter for examination and observation following other accident: Secondary | ICD-10-CM | POA: Diagnosis not present

## 2024-05-04 DIAGNOSIS — W19XXXA Unspecified fall, initial encounter: Secondary | ICD-10-CM | POA: Diagnosis not present

## 2024-05-04 DIAGNOSIS — Z96611 Presence of right artificial shoulder joint: Secondary | ICD-10-CM | POA: Diagnosis not present

## 2024-05-04 DIAGNOSIS — R7309 Other abnormal glucose: Secondary | ICD-10-CM | POA: Insufficient documentation

## 2024-05-04 DIAGNOSIS — R42 Dizziness and giddiness: Secondary | ICD-10-CM | POA: Diagnosis not present

## 2024-05-04 DIAGNOSIS — M25519 Pain in unspecified shoulder: Secondary | ICD-10-CM | POA: Diagnosis not present

## 2024-05-04 DIAGNOSIS — M19011 Primary osteoarthritis, right shoulder: Secondary | ICD-10-CM | POA: Diagnosis not present

## 2024-05-04 DIAGNOSIS — H81399 Other peripheral vertigo, unspecified ear: Secondary | ICD-10-CM | POA: Diagnosis not present

## 2024-05-04 DIAGNOSIS — M25551 Pain in right hip: Secondary | ICD-10-CM | POA: Diagnosis not present

## 2024-05-04 DIAGNOSIS — R739 Hyperglycemia, unspecified: Secondary | ICD-10-CM

## 2024-05-04 DIAGNOSIS — W1839XA Other fall on same level, initial encounter: Secondary | ICD-10-CM | POA: Insufficient documentation

## 2024-05-04 LAB — CBC WITH DIFFERENTIAL/PLATELET
Abs Immature Granulocytes: 0.04 10*3/uL (ref 0.00–0.07)
Basophils Absolute: 0 10*3/uL (ref 0.0–0.1)
Basophils Relative: 0 %
Eosinophils Absolute: 0.1 10*3/uL (ref 0.0–0.5)
Eosinophils Relative: 1 %
HCT: 45.7 % (ref 36.0–46.0)
Hemoglobin: 14.5 g/dL (ref 12.0–15.0)
Immature Granulocytes: 0 %
Lymphocytes Relative: 20 %
Lymphs Abs: 2.5 10*3/uL (ref 0.7–4.0)
MCH: 28.3 pg (ref 26.0–34.0)
MCHC: 31.7 g/dL (ref 30.0–36.0)
MCV: 89.3 fL (ref 80.0–100.0)
Monocytes Absolute: 0.6 10*3/uL (ref 0.1–1.0)
Monocytes Relative: 4 %
Neutro Abs: 9.2 10*3/uL — ABNORMAL HIGH (ref 1.7–7.7)
Neutrophils Relative %: 75 %
Platelets: 273 10*3/uL (ref 150–400)
RBC: 5.12 MIL/uL — ABNORMAL HIGH (ref 3.87–5.11)
RDW: 14.2 % (ref 11.5–15.5)
WBC: 12.4 10*3/uL — ABNORMAL HIGH (ref 4.0–10.5)
nRBC: 0 % (ref 0.0–0.2)

## 2024-05-04 LAB — COMPREHENSIVE METABOLIC PANEL WITH GFR
ALT: 19 U/L (ref 0–44)
AST: 18 U/L (ref 15–41)
Albumin: 3.8 g/dL (ref 3.5–5.0)
Alkaline Phosphatase: 88 U/L (ref 38–126)
Anion gap: 11 (ref 5–15)
BUN: 16 mg/dL (ref 8–23)
CO2: 25 mmol/L (ref 22–32)
Calcium: 9.4 mg/dL (ref 8.9–10.3)
Chloride: 102 mmol/L (ref 98–111)
Creatinine, Ser: 0.68 mg/dL (ref 0.44–1.00)
GFR, Estimated: >60 mL/min (ref >60)
Glucose, Bld: 153 mg/dL — ABNORMAL HIGH (ref 70–99)
Potassium: 3.7 mmol/L (ref 3.5–5.1)
Sodium: 138 mmol/L (ref 135–145)
Total Bilirubin: 0.8 mg/dL (ref 0.0–1.2)
Total Protein: 7.6 g/dL (ref 6.5–8.1)

## 2024-05-04 MED ORDER — DIAZEPAM 5 MG PO TABS
5.0000 mg | ORAL_TABLET | Freq: Once | ORAL | Status: AC
  Administered 2024-05-04: 5 mg via ORAL
  Filled 2024-05-04: qty 1

## 2024-05-04 MED ORDER — MECLIZINE HCL 25 MG PO TABS
25.0000 mg | ORAL_TABLET | Freq: Once | ORAL | Status: AC
  Administered 2024-05-04: 25 mg via ORAL
  Filled 2024-05-04: qty 1

## 2024-05-04 MED ORDER — SODIUM CHLORIDE 0.9 % IV SOLN: INTRAVENOUS | Status: DC

## 2024-05-04 MED ORDER — SODIUM CHLORIDE 0.9 % IV BOLUS
1000.0000 mL | Freq: Once | INTRAVENOUS | Status: AC
  Administered 2024-05-04: 1000 mL via INTRAVENOUS

## 2024-05-04 NOTE — ED Provider Notes (Signed)
  EMERGENCY DEPARTMENT AT [facility] Provider Note   CSN: [id number] Arrival date & time: 05/04/24  2039     Patient presents with: Fall and Dizziness   Kelly Taylor is a 73 y.o. female.   73 year old female with history of vertigo presents due to worsening vertiginous symptoms.  Patient states that when she moves her head she gets severely dizzy and she fell down to her right shoulder.  Did not strike her head.  Did not lose any consciousness.  Not take blood thinners.  States that she did have a whooshing from her right ear which preceded the symptoms.  Denies any pain from below the waist.  No right rib pain.  States that when keeps her head still is better but he is send movements makes her room spin around.  Denies any visual changes, speech trouble.       Prior to Admission medications   Medication Sig Start Date End Date Taking? Authorizing Provider  albuterol  (PROVENTIL  HFA;VENTOLIN  HFA) 108 (90 BASE) MCG/ACT inhaler Inhale 2 puffs into the lungs every 4 (four) hours as needed for wheezing or shortness of breath. 11/15/15   Beauty Bourbon, PA  Calcium  Carbonate Antacid (CALCIUM  CARBONATE PO) Take 1,500 mg by mouth every evening.    [provider]  chlorhexidine  (HIBICLENS ) 4 % external liquid Apply 15 mLs (1 Application total) topically as directed for 30 doses. Use as directed daily for 5 days every other week for 6 weeks. 07/19/23   Porterfield, Hospital doctor, PA-C  cyclobenzaprine  (FLEXERIL ) 10 MG tablet Take 1 tablet (10 mg total) by mouth 3 (three) times daily as needed for muscle spasms. 07/19/23   Porterfield, Amber, PA-C  diclofenac sodium (VOLTAREN) 1 % GEL Apply 1 Application topically 4 (four) times daily as needed (pain.).    [provider]  docusate sodium  (COLACE) 100 MG capsule Take 100-200 mg by mouth at bedtime.    [provider]  gabapentin (NEURONTIN) 100 MG capsule Take 100 mg by mouth 3 (three) times daily as needed  (pain). 07/21/19   [provider]  levothyroxine  (SYNTHROID ) 75 MCG tablet Take 75 mcg by mouth daily before breakfast. 09/02/19   [provider]  magnesium oxide (MAG-OX) 400 (240 Mg) MG tablet Take 400 mg by mouth every evening.    [provider]  meclizine (ANTIVERT) 25 MG tablet Take 25 mg by mouth in the morning.    [provider]  metFORMIN  (GLUCOPHAGE -XR) 500 MG 24 hr tablet Take 1,000 mg by mouth in the morning and at bedtime.    [provider]  Misc Natural Products (FLEX-A-MIN JOINT FLEX PO) Take 1 tablet by mouth in the morning. Instaflex Advanced Joint Support    [provider]  Misc Natural Products (LEG VEIN & CIRCULATION PO) Take 2 capsules by mouth daily with lunch.    [provider]  Multiple Vitamins-Minerals (HAIR SKIN NAILS PO) Take 2 capsules by mouth every evening. Ultimate Hair Strength Supplement    [provider]  Multiple Vitamins-Minerals (MULTIVITAMIN WITH MINERALS) tablet Take 1 tablet by mouth in the morning. Women's Multivitamin    [provider]  Omega-3 Fatty Acids (FISH OIL PO) Take 2 capsules by mouth daily after breakfast.    [provider]  omeprazole  (PRILOSEC) 20 MG capsule Take 20 mg by mouth daily before breakfast. 11/06/16   [provider]  polyethylene glycol (MIRALAX  / GLYCOLAX ) packet Take 17 g by mouth daily.  [provider]  Probiotic Product (PROBIOTIC PO) Take 1 capsule by mouth every evening.    [provider]  solifenacin (VESICARE) 10 MG tablet Take 10 mg by mouth in the morning.    [provider]  traMADol  (ULTRAM ) 50 MG tablet Take 50 mg by mouth 3 (three) times daily as needed (pain.).    [provider]    Allergies: Oxycontin  [oxycodone ], Penicillins, Trazodone, Lyrica [pregabalin], Relafen [nabumetone], and Sulfa antibiotics    Review of Systems  All other systems reviewed and are  negative.   Updated Vital Signs BP (!) 137/95 (BP Location: Right Arm)   Pulse (!) 115   Temp 98.9 F (37.2 C) (Oral)   Resp 18   Ht 1.702 m (5' 7)   Wt 129.3 kg   SpO2 95%   BMI 44.64 kg/m   Physical Exam Vitals and nursing note reviewed.  Constitutional:      General: She is not in acute distress.    Appearance: Normal appearance. She is well-developed. She is not toxic-appearing.  HENT:     Head: Normocephalic and atraumatic.     Right Ear: Tympanic membrane normal.   Eyes:     General: Lids are normal.     Conjunctiva/sclera: Conjunctivae normal.     Pupils: Pupils are equal, round, and reactive to light.   Neck:     Thyroid : No thyroid  mass.     Trachea: No tracheal deviation.   Cardiovascular:     Rate and Rhythm: Normal rate and regular rhythm.     Heart sounds: Normal heart sounds. No murmur heard.    No gallop.  Pulmonary:     Effort: Pulmonary effort is normal. No respiratory distress.     Breath sounds: Normal breath sounds. No stridor. No decreased breath sounds, wheezing, rhonchi or rales.  Abdominal:     General: There is no distension.     Palpations: Abdomen is soft.     Tenderness: There is no abdominal tenderness. There is no rebound.   Musculoskeletal:        General: No tenderness. Normal range of motion.       Arms:     Cervical back: Normal range of motion and neck supple.     Comments: Full range of motion at the right shoulder.  No deformities noted.  Neurovasc intact to right hand   Skin:    General: Skin is warm and dry.     Findings: No abrasion or rash.   Neurological:     Mental Status: She is alert and oriented to person, place, and time. Mental status is at baseline.     GCS: GCS eye subscore is 4. GCS verbal subscore is 5. GCS motor subscore is 6.     Cranial Nerves: No cranial nerve deficit.     Sensory: No sensory deficit.     Motor: Motor function is intact.   Psychiatric:        Attention and Perception: Attention  normal.        Speech: Speech normal.        Behavior: Behavior normal.     (all labs ordered are listed, but only abnormal results are displayed) Labs Reviewed - No data to display  EKG: EKG Interpretation Date/Time:  Sunday May 04 2024 20:53:44 EDT Ventricular Rate:  113 PR Interval:  140 QRS Duration:  109 QT Interval:  361 QTC Calculation: 495 R Axis:   -41  Text Interpretation: Sinus tachycardia Left  axis deviation Borderline prolonged QT interval No significant change since last tracing Confirmed by Lind Repine ([id number]) on 05/04/2024 9:21:41 PM  Radiology: No results found.   Procedures   Medications Ordered in the ED  diazepam  (VALIUM ) tablet 5 mg (has no administration in time range)  meclizine (ANTIVERT) tablet 25 mg (has no administration in time range)                                    Medical Decision Making Amount and/or Complexity of Data Reviewed Labs: ordered. Radiology: ordered.  Risk Prescription drug management.   Right shoulder x-rays negative for fracture.  EKG shows sinus tachycardia.  Patient denies any history of volume loss.  Was given meclizine as well as Antivert for her vertigo.  States her dizziness is much improved at this time.  Feel this is peripheral vertical and not central.  When I set patient up, patient's heart rate went from being 110 at lying flat to 126 with standing.  She denies any palpitations or chest pain.  Will give patient IV hydration here as well as check electrolytes and CBC.  Will sign off to next provider     Final diagnoses:  None    ED Discharge Orders     None          Lind Repine, MD 05/04/24 2247

## 2024-05-04 NOTE — ED Provider Notes (Signed)
 Care assumed from Dr. Leighton Punches, patient with vertigo, but she was tachycardic. IV fluids and labs have been ordered. She may need benzodiazepines added to her meclizine for control of her vertigo.  00:30 Heart rate come down to 94, patient states her vertigo is improved but she is feeling groggy from the diazepam .  There is slight dizziness elicited with passive head movement.  I have reviewed her laboratory tests, and my interpretation is mild leukocytosis which is nonspecific, normal hemoglobin, minimally elevated glucose.  I have ordered orthostatic vital signs, if satisfactory, plan to discharge her.  Since sh is having grogginess with diazepam , I will plan to send home with prescription for lorazepam.  01:00 Orthostatic vital signs today show significant increase in heart rate.  I have ordered additional IV fluids.  I am giving her prescriptions for meclizine and lorazepam.  Results for orders placed or performed during the hospital encounter of 05/04/24  CBC with Differential/Platelet   Collection Time: 05/04/24 11:02 PM  Result Value Ref Range   WBC 12.4 (H) 4.0 - 10.5 K/uL   RBC 5.12 (H) 3.87 - 5.11 MIL/uL   Hemoglobin 14.5 12.0 - 15.0 g/dL   HCT 38.7 56.4 - 33.2 %   MCV 89.3 80.0 - 100.0 fL   MCH 28.3 26.0 - 34.0 pg   MCHC 31.7 30.0 - 36.0 g/dL   RDW 95.1 88.4 - 16.6 %   Platelets 273 150 - 400 K/uL   nRBC 0.0 0.0 - 0.2 %   Neutrophils Relative % 75 %   Neutro Abs 9.2 (H) 1.7 - 7.7 K/uL   Lymphocytes Relative 20 %   Lymphs Abs 2.5 0.7 - 4.0 K/uL   Monocytes Relative 4 %   Monocytes Absolute 0.6 0.1 - 1.0 K/uL   Eosinophils Relative 1 %   Eosinophils Absolute 0.1 0.0 - 0.5 K/uL   Basophils Relative 0 %   Basophils Absolute 0.0 0.0 - 0.1 K/uL   Immature Granulocytes 0 %   Abs Immature Granulocytes 0.04 0.00 - 0.07 K/uL  Comprehensive metabolic panel with GFR   Collection Time: 05/04/24 11:02 PM  Result Value Ref Range   Sodium 138 135 - 145 mmol/L   Potassium 3.7 3.5 - 5.1  mmol/L   Chloride 102 98 - 111 mmol/L   CO2 25 22 - 32 mmol/L   Glucose, Bld 153 (H) 70 - 99 mg/dL   BUN 16 8 - 23 mg/dL   Creatinine, Ser 0.63 0.44 - 1.00 mg/dL   Calcium  9.4 8.9 - 10.3 mg/dL   Total Protein 7.6 6.5 - 8.1 g/dL   Albumin 3.8 3.5 - 5.0 g/dL   AST 18 15 - 41 U/L   ALT 19 0 - 44 U/L   Alkaline Phosphatase 88 38 - 126 U/L   Total Bilirubin 0.8 0.0 - 1.2 mg/dL   GFR, Estimated >01 >60 mL/min   Anion gap 11 5 - 15   DG Shoulder Right Result Date: 05/04/2024 CLINICAL DATA:  109323 Fall 557322 EXAM: RIGHT SHOULDER - 2+ VIEW COMPARISON:  Chest x-ray 06/18/2023 FINDINGS: Reverse total right shoulder arthroplasty. No radiographic findings to suggest surgical hardware complication. Acromioclavicular joint degenerative changes. No aggressive appearing bone abnormality. Soft tissues are unremarkable. IMPRESSION: Negative for acute traumatic injury. Electronically Signed   By: Morgane  Naveau M.D.   On: 05/04/2024 21:59      Alissa April, MD 05/05/24 [phone]

## 2024-05-04 NOTE — ED Triage Notes (Signed)
 BIBA from home.  Hx of vertigo worsening today.  States she feels like water  whooshing through her right ear.  Fell on right side today. Right shoulder and right hip pain  160/90 HR 116 O2 93% RR 18 CBG 170

## 2024-05-05 DIAGNOSIS — H81399 Other peripheral vertigo, unspecified ear: Secondary | ICD-10-CM | POA: Diagnosis not present

## 2024-05-05 MED ORDER — LORAZEPAM 0.5 MG PO TABS: 0.5000 mg | ORAL_TABLET | Freq: Three times a day (TID) | ORAL | 0 refills | Status: DC | PRN

## 2024-05-05 MED ORDER — MECLIZINE HCL 25 MG PO TABS: 25.0000 mg | ORAL_TABLET | Freq: Three times a day (TID) | ORAL | 0 refills | Status: DC | PRN

## 2024-05-05 MED ORDER — SODIUM CHLORIDE 0.9 % IV BOLUS
1000.0000 mL | Freq: Once | INTRAVENOUS | Status: AC
  Administered 2024-05-05: 1000 mL via INTRAVENOUS

## 2024-05-06 ENCOUNTER — Ambulatory Visit (INDEPENDENT_AMBULATORY_CARE_PROVIDER_SITE_OTHER): Admitting: Audiology

## 2024-05-06 ENCOUNTER — Institutional Professional Consult (permissible substitution) (INDEPENDENT_AMBULATORY_CARE_PROVIDER_SITE_OTHER): Admitting: Physician Assistant

## 2024-05-06 NOTE — Progress Notes (Deleted)
 Dear Dr. Hazeline Lister, Here is my assessment for our mutual patient, Kelly Taylor. Thank you for allowing me the opportunity to care for your patient. Please do not hesitate to contact me should you have any other questions. Sincerely, Belma Boxer PA-C  Otolaryngology Clinic Note Referring provider: Dr. Hazeline Lister HPI:  Malay Fantroy is a 73 y.o. female kindly referred by Dr. Hazeline Lister   The patient ***   The patient is a 73 year old female seen in our office for evaluation of vertigo.  She was seen in the emergency room on 05/04/2024.  She suffered a fall.  She notes a preceding tinnitus in her right ear.  He was given meclizine.  She also had some heart rate changes when going from sitting to standing, she was given fluids.  Discharged with lorazepam and meclizine.     Independent Review of Additional Tests or Records:  ***   PMH/Meds/All/SocHx/FamHx/ROS:   Past Medical History:  Diagnosis Date   Anemia    hx of in 20's   Aortic atherosclerosis (HCC)    Arthritis    Ascending aorta dilatation (HCC)    42mm by CT 10/23   Asthma    Back pain    Breast mass, left 2003   Chronic back pain    seeing Pain specialist   Complication of anesthesia    shallow breathing with general   Constipation    DDD (degenerative disc disease)    Depression    Diabetes mellitus    borderline   DJD (degenerative joint disease)    Family history of heart attack    GERD (gastroesophageal reflux disease)    H/O scarlet fever    Hemorrhoids    History of chicken pox    History of measles, mumps, or rubella as child   all 3   Hx of bladder infections    Hypothyroidism    Irritable bowel syndrome    Joint pain    Lumbar stenosis    RFA done May 2015   Menopausal symptoms 2003   Obesity, morbid (HCC) 2006   Pericarditis    hx of   Pneumonia    hx of 5 years ago   Polyp of colon    removed during colonoscopy   SOBOE (shortness of breath on exertion)    Spinal headache    x1   Thyroid   condition    Trichomonas    Urge incontinence 2005   Vertigo    over a year was last episode   Yeast in stool      Past Surgical History:  Procedure Laterality Date   ABDOMINAL HYSTERECTOMY  02/1990   APPENDECTOMY  age 66   BLADDER SUSPENSION     BREAST LUMPECTOMY Left 2003   BREAST REDUCTION SURGERY  1992   CHOLECYSTECTOMY  2010   COLONOSCOPY  may 2015   4 polyps-benign   DILATION AND CURETTAGE OF UTERUS     FOOT SURGERY Right    pinched nerve   HAND SURGERY Right    right-Dr. Aloha Arnold   HERNIA REPAIR Right 1992   INTERSTIM IMPLANT PLACEMENT Right 2003   for incontinence   KNEE SURGERY     bilateral knee; x3 right/ x2 left   REVERSE SHOULDER ARTHROPLASTY Left 04/19/2017   Procedure: REVERSE SHOULDER ARTHROPLASTY;  Surgeon: Sammye Cristal, MD;  Location: [facility] OR;  Service: Orthopedics;  Laterality: Left;  REVERSE SHOULDER ARTHROPLASTY   REVERSE SHOULDER ARTHROPLASTY Right 07/19/2023   Procedure: REVERSE SHOULDER ARTHROPLASTY;  Surgeon: Sammye Cristal,  MD;  Location: [facility];  Service: Orthopedics;  Laterality: Right;   ROTATOR CUFF REPAIR  2008   right, almost complete reconstruction, bone anchors   TONSILLECTOMY  age 41   TOTAL HIP ARTHROPLASTY Right 07/30/2014   Procedure: RIGHT TOTAL HIP ARTHROPLASTY ANTERIOR APPROACH;  Surgeon: Arnie Lao, MD;  Location: [facility];  Service: Orthopedics;  Laterality: Right;   TOTAL KNEE ARTHROPLASTY Right 04/07/2013   Procedure: RIGHT TOTAL KNEE ARTHROPLASTY;  Surgeon: Aurther Blue, MD;  Location: [facility];  Service: Orthopedics;  Laterality: Right;   TOTAL KNEE ARTHROPLASTY Left 09/22/2013   Procedure: LEFT TOTAL KNEE ARTHROPLASTY;  Surgeon: Aurther Blue, MD;  Location: [facility];  Service: Orthopedics;  Laterality: Left;    Family History  Problem Relation Age of Onset   Cancer Mother    Hypertension Mother    Diabetes Mother    Lung cancer Mother    Heart disease Mother    Obesity Mother    Depression Father    Stroke  Paternal Grandfather    Ovarian cancer Maternal Aunt    Breast cancer Maternal Aunt      Social Connections: Not on file      Current Outpatient Medications:    albuterol  (PROVENTIL  HFA;VENTOLIN  HFA) 108 (90 BASE) MCG/ACT inhaler, Inhale 2 puffs into the lungs every 4 (four) hours as needed for wheezing or shortness of breath. (Patient not taking: Reported on 05/04/2024), Disp: 1 Inhaler, Rfl: 0   aspirin  EC 81 MG tablet, Take 81 mg by mouth daily. Swallow whole., Disp: , Rfl:    chlorhexidine  (HIBICLENS ) 4 % external liquid, Apply 15 mLs (1 Application total) topically as directed for 30 doses. Use as directed daily for 5 days every other week for 6 weeks. (Patient not taking: Reported on 05/04/2024), Disp: 946 mL, Rfl: 1   cyclobenzaprine  (FLEXERIL ) 10 MG tablet, Take 1 tablet (10 mg total) by mouth 3 (three) times daily as needed for muscle spasms., Disp: 30 tablet, Rfl: 0   diclofenac sodium (VOLTAREN) 1 % GEL, Apply 2 g topically 4 (four) times daily as needed (for pain)., Disp: , Rfl:    docusate sodium  (COLACE) 100 MG capsule, Take 200 mg by mouth every evening., Disp: , Rfl:    furosemide (LASIX) 40 MG tablet, Take 40 mg by mouth daily as needed (when the feet swell)., Disp: , Rfl:    gabapentin (NEURONTIN) 100 MG capsule, Take 300 mg by mouth in the morning., Disp: , Rfl:    imipramine (TOFRANIL) 25 MG tablet, Take 25 mg by mouth in the morning and at bedtime., Disp: , Rfl:    ipratropium (ATROVENT) 0.06 % nasal spray, Place 2 sprays into both nostrils 4 (four) times daily as needed (for seasonal allergies)., Disp: , Rfl:    LORazepam (ATIVAN) 0.5 MG tablet, Take 1 tablet (0.5 mg total) by mouth 3 (three) times daily as needed (vertigo)., Disp: 20 tablet, Rfl: 0   magnesium oxide (MAG-OX) 400 (240 Mg) MG tablet, Take 400 mg by mouth every evening., Disp: , Rfl:    meclizine (ANTIVERT) 25 MG tablet, Take 1 tablet (25 mg total) by mouth 3 (three) times daily as needed for dizziness., Disp:  30 tablet, Rfl: 0   meloxicam  (MOBIC ) 15 MG tablet, Take 15 mg by mouth daily with breakfast., Disp: , Rfl:    metFORMIN  (GLUCOPHAGE -XR) 500 MG 24 hr tablet, Take 500 mg by mouth in the morning and at bedtime., Disp: , Rfl:    Misc  Natural Products (FLEX-A-MIN JOINT FLEX PO), Take 1 tablet by mouth in the morning. Instaflex Advanced Joint Support, Disp: , Rfl:    Multiple Vitamins-Minerals (WOMENS MULTIVITAMIN) TABS, Take 1 tablet by mouth daily with breakfast., Disp: , Rfl:    NON FORMULARY, Take 2 tablets by mouth See admin instructions. WEEM Hair Skin and Nails Gummies with Biotin- Chew 2 gummies by mouth once a day, Disp: , Rfl:    nystatin (MYCOSTATIN) 100000 UNIT/ML suspension, Take 5 mLs by mouth 4 (four) times daily as needed (for tongue pain- as directed)., Disp: , Rfl:    Omega-3 Fatty Acids (FISH OIL PO), Take 2 capsules by mouth daily after breakfast., Disp: , Rfl:    omeprazole  (PRILOSEC) 20 MG capsule, Take 20 mg by mouth daily before breakfast., Disp: , Rfl: 1   polyethylene glycol (MIRALAX  / GLYCOLAX ) packet, Take 17 g by mouth daily., Disp: , Rfl:    Probiotic Product (PROBIOTIC PO), Take 1 capsule by mouth every evening., Disp: , Rfl:    simvastatin  (ZOCOR ) 10 MG tablet, Take 10 mg by mouth at bedtime., Disp: , Rfl:    solifenacin (VESICARE) 10 MG tablet, Take 10 mg by mouth in the morning., Disp: , Rfl:    SYNTHROID  75 MCG tablet, Take 75 mcg by mouth daily before breakfast., Disp: , Rfl:    traMADol  (ULTRAM ) 50 MG tablet, Take 50 mg by mouth 3 (three) times daily as needed (for pain)., Disp: , Rfl:    Physical Exam:   There were no vitals taken for this visit.  Pertinent Findings  CN II-XII intact ***Bilateral EAC clear and TM intact with well pneumatized middle ear spaces Weber 512: equal Rinne 512: AC > BC b/l  Anterior rhinoscopy: Septum ***; bilateral inferior turbinates with *** No lesions of oral cavity/oropharynx; dentition *** No obviously palpable neck  masses/lymphadenopathy/thyromegaly No respiratory distress or stridor  Seprately Identifiable Procedures:  None***  Impression & Plans:  Reygan Heagle is a 73 y.o. female with the following   ***   - f/u ***   Thank you for allowing me the opportunity to care for your patient. Please do not hesitate to contact me should you have any other questions.  Sincerely, Belma Boxer PA-C [facility] Phone: [phone] Fax: [phone]  05/06/2024, 8:36 AM

## 2024-05-07 DIAGNOSIS — R42 Dizziness and giddiness: Secondary | ICD-10-CM | POA: Diagnosis not present

## 2024-05-07 DIAGNOSIS — R55 Syncope and collapse: Secondary | ICD-10-CM | POA: Diagnosis not present

## 2024-05-07 DIAGNOSIS — R000 Tachycardia, unspecified: Secondary | ICD-10-CM | POA: Diagnosis not present

## 2024-05-08 DIAGNOSIS — H9311 Tinnitus, right ear: Secondary | ICD-10-CM | POA: Diagnosis not present

## 2024-05-08 DIAGNOSIS — R42 Dizziness and giddiness: Secondary | ICD-10-CM | POA: Diagnosis not present

## 2024-05-28 DIAGNOSIS — R42 Dizziness and giddiness: Secondary | ICD-10-CM | POA: Diagnosis not present

## 2024-05-28 DIAGNOSIS — H903 Sensorineural hearing loss, bilateral: Secondary | ICD-10-CM | POA: Diagnosis not present

## 2024-05-28 DIAGNOSIS — H9311 Tinnitus, right ear: Secondary | ICD-10-CM | POA: Diagnosis not present

## 2024-06-12 ENCOUNTER — Other Ambulatory Visit: Payer: Self-pay | Admitting: Family Medicine

## 2024-06-12 DIAGNOSIS — R42 Dizziness and giddiness: Secondary | ICD-10-CM

## 2024-06-12 DIAGNOSIS — R296 Repeated falls: Secondary | ICD-10-CM

## 2024-06-13 ENCOUNTER — Ambulatory Visit (HOSPITAL_COMMUNITY)
Admission: RE | Admit: 2024-06-13 | Discharge: 2024-06-13 | Disposition: A | Discharge status: Home / Self Care | Source: Ambulatory Visit | Attending: Family Medicine | Admitting: Family Medicine

## 2024-06-13 ENCOUNTER — Other Ambulatory Visit (HOSPITAL_COMMUNITY): Payer: Self-pay | Admitting: Family Medicine

## 2024-06-13 DIAGNOSIS — R296 Repeated falls: Secondary | ICD-10-CM | POA: Insufficient documentation

## 2024-06-13 DIAGNOSIS — R42 Dizziness and giddiness: Secondary | ICD-10-CM | POA: Insufficient documentation

## 2024-06-13 DIAGNOSIS — I6782 Cerebral ischemia: Secondary | ICD-10-CM | POA: Diagnosis not present

## 2024-06-13 MED ORDER — GADOBUTROL 1 MMOL/ML IV SOLN
10.0000 mL | Freq: Once | INTRAVENOUS | Status: AC | PRN
  Administered 2024-06-13: 10 mL via INTRAVENOUS

## 2024-06-17 DIAGNOSIS — M47816 Spondylosis without myelopathy or radiculopathy, lumbar region: Secondary | ICD-10-CM | POA: Diagnosis not present

## 2024-06-17 DIAGNOSIS — Z1330 Encounter for screening examination for mental health and behavioral disorders, unspecified: Secondary | ICD-10-CM | POA: Diagnosis not present

## 2024-06-17 DIAGNOSIS — M479 Spondylosis, unspecified: Secondary | ICD-10-CM | POA: Diagnosis not present

## 2024-06-20 ENCOUNTER — Ambulatory Visit
Admission: RE | Admit: 2024-06-20 | Discharge: 2024-06-20 | Disposition: A | Discharge status: Home / Self Care | Source: Ambulatory Visit | Attending: Family Medicine | Admitting: Family Medicine

## 2024-06-20 DIAGNOSIS — R296 Repeated falls: Secondary | ICD-10-CM

## 2024-06-20 DIAGNOSIS — R42 Dizziness and giddiness: Secondary | ICD-10-CM

## 2024-06-20 DIAGNOSIS — N3941 Urge incontinence: Secondary | ICD-10-CM | POA: Diagnosis not present

## 2024-06-20 MED ORDER — GADOPICLENOL 0.5 MMOL/ML IV SOLN: 10.0000 mL | Freq: Once | INTRAVENOUS | Status: DC | PRN

## 2024-07-03 ENCOUNTER — Other Ambulatory Visit: Payer: Self-pay

## 2024-07-03 ENCOUNTER — Emergency Department (HOSPITAL_BASED_OUTPATIENT_CLINIC_OR_DEPARTMENT_OTHER)
Admission: EM | Admit: 2024-07-03 | Discharge: 2024-07-04 | Disposition: A | Discharge status: Home / Self Care | Attending: Emergency Medicine | Admitting: Emergency Medicine

## 2024-07-03 DIAGNOSIS — K6289 Other specified diseases of anus and rectum: Secondary | ICD-10-CM | POA: Insufficient documentation

## 2024-07-03 DIAGNOSIS — Z79899 Other long term (current) drug therapy: Secondary | ICD-10-CM | POA: Insufficient documentation

## 2024-07-03 DIAGNOSIS — J45909 Unspecified asthma, uncomplicated: Secondary | ICD-10-CM | POA: Diagnosis not present

## 2024-07-03 DIAGNOSIS — Z7984 Long term (current) use of oral hypoglycemic drugs: Secondary | ICD-10-CM | POA: Insufficient documentation

## 2024-07-03 DIAGNOSIS — R1084 Generalized abdominal pain: Secondary | ICD-10-CM

## 2024-07-03 DIAGNOSIS — E039 Hypothyroidism, unspecified: Secondary | ICD-10-CM | POA: Insufficient documentation

## 2024-07-03 DIAGNOSIS — R109 Unspecified abdominal pain: Secondary | ICD-10-CM | POA: Insufficient documentation

## 2024-07-03 DIAGNOSIS — K219 Gastro-esophageal reflux disease without esophagitis: Secondary | ICD-10-CM | POA: Diagnosis not present

## 2024-07-03 DIAGNOSIS — E119 Type 2 diabetes mellitus without complications: Secondary | ICD-10-CM | POA: Diagnosis not present

## 2024-07-03 DIAGNOSIS — Z7982 Long term (current) use of aspirin: Secondary | ICD-10-CM | POA: Insufficient documentation

## 2024-07-03 DIAGNOSIS — K5900 Constipation, unspecified: Secondary | ICD-10-CM | POA: Insufficient documentation

## 2024-07-03 LAB — CBC WITH DIFFERENTIAL/PLATELET
Abs Immature Granulocytes: 0.03 K/uL (ref 0.00–0.07)
Basophils Absolute: 0 K/uL (ref 0.0–0.1)
Basophils Relative: 0 %
Eosinophils Absolute: 0 K/uL (ref 0.0–0.5)
Eosinophils Relative: 0 %
HCT: 42.4 % (ref 36.0–46.0)
Hemoglobin: 14.1 g/dL (ref 12.0–15.0)
Immature Granulocytes: 0 %
Lymphocytes Relative: 24 %
Lymphs Abs: 2.4 K/uL (ref 0.7–4.0)
MCH: 29.1 pg (ref 26.0–34.0)
MCHC: 33.3 g/dL (ref 30.0–36.0)
MCV: 87.6 fL (ref 80.0–100.0)
Monocytes Absolute: 0.7 K/uL (ref 0.1–1.0)
Monocytes Relative: 6 %
Neutro Abs: 7 K/uL (ref 1.7–7.7)
Neutrophils Relative %: 70 %
Platelets: 270 K/uL (ref 150–400)
RBC: 4.84 MIL/uL (ref 3.87–5.11)
RDW: 14.2 % (ref 11.5–15.5)
WBC: 10.2 K/uL (ref 4.0–10.5)
nRBC: 0 % (ref 0.0–0.2)

## 2024-07-03 MED ORDER — HYDROMORPHONE HCL 1 MG/ML IJ SOLN
1.0000 mg | Freq: Once | INTRAMUSCULAR | Status: AC
  Administered 2024-07-03: 1 mg via INTRAVENOUS
  Filled 2024-07-03: qty 1

## 2024-07-03 MED ORDER — ONDANSETRON HCL 4 MG/2ML IJ SOLN
4.0000 mg | Freq: Once | INTRAMUSCULAR | Status: AC
  Administered 2024-07-03: 4 mg via INTRAVENOUS
  Filled 2024-07-03: qty 2

## 2024-07-03 NOTE — ED Provider Notes (Signed)
 [place] EMERGENCY DEPARTMENT AT [facility] Provider Note   CSN: [id number] Arrival date & time: 07/03/24  2042     Patient presents with: Constipation   Kelly Taylor is a 73 y.o. female.    Constipation Patient abdominal pain and constipation.  No bowel movement in 5 days.  No nausea.  States abdomen hurts all over.  States it feels in her rear-end as if she needs to have a bowel movement.  she is on chronic pain medicines for spinal issues.  No fevers.  No dysuria.    Past Medical History:  Diagnosis Date   Anemia    hx of in 20's   Aortic atherosclerosis (HCC)    Arthritis    Ascending aorta dilatation (HCC)    42mm by CT 10/23   Asthma    Back pain    Breast mass, left 2003   Chronic back pain    seeing Pain specialist   Complication of anesthesia    shallow breathing with general   Constipation    DDD (degenerative disc disease)    Depression    Diabetes mellitus    borderline   DJD (degenerative joint disease)    Family history of heart attack    GERD (gastroesophageal reflux disease)    H/O scarlet fever    Hemorrhoids    History of chicken pox    History of measles, mumps, or rubella as child   all 3   Hx of bladder infections    Hypothyroidism    Irritable bowel syndrome    Joint pain    Lumbar stenosis    RFA done May 2015   Menopausal symptoms 2003   Obesity, morbid (HCC) 2006   Pericarditis    hx of   Pneumonia    hx of 5 years ago   Polyp of colon    removed during colonoscopy   SOBOE (shortness of breath on exertion)    Spinal headache    x1   Thyroid  condition    Trichomonas    Urge incontinence 2005   Vertigo    over a year was last episode   Yeast in stool     Prior to Admission medications   Medication Sig Start Date End Date Taking? Authorizing Provider  albuterol  (PROVENTIL  HFA;VENTOLIN  HFA) 108 (90 BASE) MCG/ACT inhaler Inhale 2 puffs into the lungs every 4 (four) hours as needed for wheezing or shortness of  breath. Patient not taking: Reported on 05/04/2024 11/15/15   Belvie Dempsey BROCKS, PA  aspirin  EC 81 MG tablet Take 81 mg by mouth daily. Swallow whole.    [provider]  chlorhexidine  (HIBICLENS ) 4 % external liquid Apply 15 mLs (1 Application total) topically as directed for 30 doses. Use as directed daily for 5 days every other week for 6 weeks. Patient not taking: Reported on 05/04/2024 07/19/23   Porterfield, [facility], PA-C  cyclobenzaprine  (FLEXERIL ) 10 MG tablet Take 1 tablet (10 mg total) by mouth 3 (three) times daily as needed for muscle spasms. 07/19/23   Porterfield, Amber, PA-C  diclofenac sodium (VOLTAREN) 1 % GEL Apply 2 g topically 4 (four) times daily as needed (for pain).    [provider]  docusate sodium  (COLACE) 100 MG capsule Take 200 mg by mouth every evening.    [provider]  furosemide (LASIX) 40 MG tablet Take 40 mg by mouth daily as needed (when the feet swell).    [provider]  gabapentin (NEURONTIN) 100  MG capsule Take 300 mg by mouth in the morning. 07/21/19   [provider]  imipramine (TOFRANIL) 25 MG tablet Take 25 mg by mouth in the morning and at bedtime.    [provider]  ipratropium (ATROVENT) 0.06 % nasal spray Place 2 sprays into both nostrils 4 (four) times daily as needed (for seasonal allergies).    [provider]  LORazepam  (ATIVAN ) 0.5 MG tablet Take 1 tablet (0.5 mg total) by mouth 3 (three) times daily as needed (vertigo). 05/05/24   Raford Lenis, MD  magnesium  oxide (MAG-OX) 400 (240 Mg) MG tablet Take 400 mg by mouth every evening.    [provider]  meclizine  (ANTIVERT ) 25 MG tablet Take 1 tablet (25 mg total) by mouth 3 (three) times daily as needed for dizziness. 05/05/24   Raford Lenis, MD  meloxicam  (MOBIC ) 15 MG tablet Take 15 mg by mouth daily with breakfast.    [provider]  metFORMIN  (GLUCOPHAGE -XR) 500 MG 24 hr tablet Take 500 mg by mouth in the morning and at  bedtime.    [provider]  Misc Natural Products (FLEX-A-MIN JOINT FLEX PO) Take 1 tablet by mouth in the morning. Instaflex Advanced Joint Support    [provider]  Multiple Vitamins-Minerals (WOMENS MULTIVITAMIN) TABS Take 1 tablet by mouth daily with breakfast.    [provider]  NON FORMULARY Take 2 tablets by mouth See admin instructions. WEEM Hair Skin and Nails Gummies with Biotin- Chew 2 gummies by mouth once a day    [provider]  nystatin (MYCOSTATIN) 100000 UNIT/ML suspension Take 5 mLs by mouth 4 (four) times daily as needed (for tongue pain- as directed).    [provider]  Omega-3 Fatty Acids (FISH OIL PO) Take 2 capsules by mouth daily after breakfast.    [provider]  omeprazole  (PRILOSEC) 20 MG capsule Take 20 mg by mouth daily before breakfast. 11/06/16   [provider]  polyethylene glycol (MIRALAX  / GLYCOLAX ) packet Take 17 g by mouth daily.    [provider]  Probiotic Product (PROBIOTIC PO) Take 1 capsule by mouth every evening.    [provider]  simvastatin  (ZOCOR ) 10 MG tablet Take 10 mg by mouth at bedtime.    [provider]  solifenacin (VESICARE) 10 MG tablet Take 10 mg by mouth in the morning.    [provider]  SYNTHROID  75 MCG tablet Take 75 mcg by mouth daily before breakfast.    [provider]  traMADol  (ULTRAM ) 50 MG tablet Take 50 mg by mouth 3 (three) times daily as needed (for pain).    [provider]    Allergies: Oxycodone , Penicillins, Trazodone, Lyrica [pregabalin], Nabumetone, and Sulfa antibiotics    Review of Systems  Gastrointestinal:  Positive for constipation.    Updated Vital Signs BP 122/80   Pulse 100   Temp 99.7 F (37.6 C) (Oral)   Resp 18   Ht 5' 7 (1.702 m)   Wt 127 kg   SpO2 94%   BMI 43.85 kg/m   Physical Exam Vitals reviewed.  Constitutional:      Appearance: She is obese.     Comments:  Sitting up in bed and appears uncomfortable  Abdominal:     Tenderness: There is abdominal tenderness.     Comments: Diffuse abdominal tenderness with somewhat diffuse firmness.  Genitourinary:    Comments: Rectal exam did show tenderness and did have stool but not fecal impaction.    (  all labs ordered are listed, but only abnormal results are displayed) Labs Reviewed  CBC WITH DIFFERENTIAL/PLATELET  COMPREHENSIVE METABOLIC PANEL WITH GFR  LIPASE, BLOOD  URINALYSIS, ROUTINE W REFLEX MICROSCOPIC    EKG: None  Radiology: No results found.   Procedures   Medications Ordered in the ED  HYDROmorphone  (DILAUDID ) injection 1 mg (1 mg Intravenous Given 07/03/24 2231)  ondansetron  (ZOFRAN ) injection 4 mg (4 mg Intravenous Given 07/03/24 2231)                                    Medical Decision Making Amount and/or Complexity of Data Reviewed Labs: ordered. Radiology: ordered.  Risk Prescription drug management.   Patient with rectal pain and constipation.  Previous hysterectomy.  Differential diagnosis obstruction, infection, constipation.  Will get blood work.  Rectal exam did not show fecal impaction.  Will get blood work.  Care turned over to Dr. Carita.     Final diagnoses:  None    ED Discharge Orders     None          Patsey Lot, MD 07/03/24 2336

## 2024-07-03 NOTE — ED Triage Notes (Signed)
 Pt POV reporting constipation, no BM in 5 days, tearful in triage. Hx of same, taking stool softeners and laxatives with no success.

## 2024-07-04 ENCOUNTER — Emergency Department (HOSPITAL_BASED_OUTPATIENT_CLINIC_OR_DEPARTMENT_OTHER)

## 2024-07-04 DIAGNOSIS — R109 Unspecified abdominal pain: Secondary | ICD-10-CM | POA: Diagnosis not present

## 2024-07-04 DIAGNOSIS — R079 Chest pain, unspecified: Secondary | ICD-10-CM | POA: Diagnosis not present

## 2024-07-04 DIAGNOSIS — K5649 Other impaction of intestine: Secondary | ICD-10-CM | POA: Diagnosis not present

## 2024-07-04 DIAGNOSIS — N281 Cyst of kidney, acquired: Secondary | ICD-10-CM | POA: Diagnosis not present

## 2024-07-04 DIAGNOSIS — R911 Solitary pulmonary nodule: Secondary | ICD-10-CM | POA: Diagnosis not present

## 2024-07-04 LAB — COMPREHENSIVE METABOLIC PANEL WITH GFR
ALT: 13 U/L (ref 0–44)
AST: 18 U/L (ref 15–41)
Albumin: 4.2 g/dL (ref 3.5–5.0)
Alkaline Phosphatase: 98 U/L (ref 38–126)
Anion gap: 12 (ref 5–15)
BUN: 15 mg/dL (ref 8–23)
CO2: 24 mmol/L (ref 22–32)
Calcium: 9.6 mg/dL (ref 8.9–10.3)
Chloride: 104 mmol/L (ref 98–111)
Creatinine, Ser: 0.69 mg/dL (ref 0.44–1.00)
GFR, Estimated: >60 mL/min (ref >60)
Glucose, Bld: 141 mg/dL — ABNORMAL HIGH (ref 70–99)
Potassium: 4.2 mmol/L (ref 3.5–5.1)
Sodium: 140 mmol/L (ref 135–145)
Total Bilirubin: 0.4 mg/dL (ref 0.0–1.2)
Total Protein: 7.1 g/dL (ref 6.5–8.1)

## 2024-07-04 LAB — URINALYSIS, ROUTINE W REFLEX MICROSCOPIC
Bilirubin Urine: NEGATIVE
Glucose, UA: NEGATIVE mg/dL
Ketones, ur: NEGATIVE mg/dL
Leukocytes,Ua: NEGATIVE
Nitrite: POSITIVE — AB
Specific Gravity, Urine: >1.046 — ABNORMAL HIGH (ref 1.005–1.030)
pH: 6 (ref 5.0–8.0)

## 2024-07-04 LAB — LIPASE, BLOOD: Lipase: 24 U/L (ref 11–51)

## 2024-07-04 MED ORDER — HYDROCORTISONE (PERIANAL) 2.5 % EX CREA: 1.0000 | TOPICAL_CREAM | Freq: Two times a day (BID) | CUTANEOUS | 0 refills | Status: AC

## 2024-07-04 MED ORDER — LIDOCAINE HCL URETHRAL/MUCOSAL 2 % EX GEL: 1.0000 | Freq: Once | CUTANEOUS | Status: DC

## 2024-07-04 MED ORDER — MORPHINE SULFATE (PF) 4 MG/ML IV SOLN
4.0000 mg | Freq: Once | INTRAVENOUS | Status: AC
  Administered 2024-07-04: 4 mg via INTRAVENOUS
  Filled 2024-07-04: qty 1

## 2024-07-04 MED ORDER — NITROFURANTOIN MONOHYD MACRO 100 MG PO CAPS: 100.0000 mg | ORAL_CAPSULE | Freq: Two times a day (BID) | ORAL | 0 refills | Status: DC

## 2024-07-04 MED ORDER — DOCUSATE SODIUM 100 MG PO CAPS: 100.0000 mg | ORAL_CAPSULE | Freq: Two times a day (BID) | ORAL | 0 refills | Status: DC

## 2024-07-04 MED ORDER — FLEET ENEMA RE ENEM
1.0000 | ENEMA | Freq: Once | RECTAL | Status: AC
  Administered 2024-07-04: 1 via RECTAL
  Filled 2024-07-04: qty 1

## 2024-07-04 MED ORDER — IOHEXOL 300 MG/ML  SOLN: 100.0000 mL | Freq: Once | INTRAMUSCULAR | Status: DC | PRN

## 2024-07-04 MED ORDER — SODIUM CHLORIDE 0.9 % IV BOLUS
1000.0000 mL | Freq: Once | INTRAVENOUS | Status: AC
  Administered 2024-07-04: 1000 mL via INTRAVENOUS

## 2024-07-04 MED ORDER — IOHEXOL 350 MG/ML SOLN
100.0000 mL | Freq: Once | INTRAVENOUS | Status: AC | PRN
  Administered 2024-07-04: 100 mL via INTRAVENOUS

## 2024-07-04 NOTE — Discharge Instructions (Addendum)
 Increase MiraLAX  to twice daily for the next 3 days then take daily.  Use Anusol  cream for your painful hemorrhoids you can also use topical lidocaine  from the pharmacy.  You may have a UTI and should take the antibiotics as prescribed as well follow-up with your primary doctor or the GI doctor.  Return to the ED with abdominal pain, vomiting, not able to have a bowel movement or any other concerns.

## 2024-07-04 NOTE — ED Notes (Signed)
 ED Provider at bedside.

## 2024-07-04 NOTE — ED Notes (Signed)
 Patient transported to CT

## 2024-07-04 NOTE — ED Provider Notes (Signed)
 Care assumed from Dr. Patsey.  Patient here with constipation and abdominal pain with no bowel movement for 5 days.  No vomiting.  Pending CT scan.  Mildly tachycardic.  IV fluids given.  CT scan shows some stool in the colon but no bowel obstruction.  No pulmonary emboli.  Transient hypoxia likely secondary to her opiate medication she received as well as possible sleep apnea.  Dr. Patsey attempted disimpaction earlier unsuccessfully.  Patient was given enema.  She was then able to have a bowel movement and is feeling better.  Her abdomen is soft and nontender.  She is requesting medication for her hemorrhoids which was provided. States she already takes MiraLAX  every day.  Advised to increase to twice daily and continue Colace.  Follow-up with your GI doctor. Urinalysis is positive, she does have mild dysuria.  Will give Keflex  while culture is pending. BP 117/83   Pulse 90   Temp 98.6 F (37 C) (Oral)   Resp 20   Ht 5' 7 (1.702 m)   Wt 127 kg   SpO2 91%   BMI 43.85 kg/m     Kelly Senior, MD 07/04/24 [phone]

## 2024-07-04 NOTE — ED Notes (Signed)
 Lab called and aware a urine culture needs to be added.

## 2024-07-04 NOTE — ED Notes (Addendum)
 Pt unable to provide urine sample at this time. Instructed to press call bell when able.

## 2024-07-06 LAB — URINE CULTURE: Culture: >=100000 — AB

## 2024-07-08 DIAGNOSIS — M47816 Spondylosis without myelopathy or radiculopathy, lumbar region: Secondary | ICD-10-CM | POA: Diagnosis not present

## 2024-07-10 ENCOUNTER — Emergency Department (HOSPITAL_COMMUNITY)
Admission: EM | Admit: 2024-07-10 | Discharge: 2024-07-11 | Disposition: A | Discharge status: Home / Self Care | Source: Ambulatory Visit | Attending: Emergency Medicine | Admitting: Emergency Medicine

## 2024-07-10 ENCOUNTER — Other Ambulatory Visit: Payer: Self-pay

## 2024-07-10 ENCOUNTER — Encounter (HOSPITAL_COMMUNITY): Payer: Self-pay

## 2024-07-10 ENCOUNTER — Emergency Department (HOSPITAL_COMMUNITY)

## 2024-07-10 DIAGNOSIS — Z7982 Long term (current) use of aspirin: Secondary | ICD-10-CM | POA: Insufficient documentation

## 2024-07-10 DIAGNOSIS — K5900 Constipation, unspecified: Secondary | ICD-10-CM | POA: Insufficient documentation

## 2024-07-10 DIAGNOSIS — R110 Nausea: Secondary | ICD-10-CM | POA: Insufficient documentation

## 2024-07-10 DIAGNOSIS — R109 Unspecified abdominal pain: Secondary | ICD-10-CM | POA: Diagnosis not present

## 2024-07-10 LAB — COMPREHENSIVE METABOLIC PANEL WITH GFR
ALT: 13 U/L (ref 0–44)
AST: 21 U/L (ref 15–41)
Albumin: 3.7 g/dL (ref 3.5–5.0)
Alkaline Phosphatase: 74 U/L (ref 38–126)
Anion gap: 9 (ref 5–15)
BUN: 16 mg/dL (ref 8–23)
CO2: 22 mmol/L (ref 22–32)
Calcium: 9.3 mg/dL (ref 8.9–10.3)
Chloride: 107 mmol/L (ref 98–111)
Creatinine, Ser: 0.61 mg/dL (ref 0.44–1.00)
GFR, Estimated: >60 mL/min (ref >60)
Glucose, Bld: 184 mg/dL — ABNORMAL HIGH (ref 70–99)
Potassium: 4.6 mmol/L (ref 3.5–5.1)
Sodium: 138 mmol/L (ref 135–145)
Total Bilirubin: 0.4 mg/dL (ref 0.0–1.2)
Total Protein: 7.2 g/dL (ref 6.5–8.1)

## 2024-07-10 LAB — CBC
HCT: 42.9 % (ref 36.0–46.0)
Hemoglobin: 13.5 g/dL (ref 12.0–15.0)
MCH: 28.8 pg (ref 26.0–34.0)
MCHC: 31.5 g/dL (ref 30.0–36.0)
MCV: 91.5 fL (ref 80.0–100.0)
Platelets: 277 K/uL (ref 150–400)
RBC: 4.69 MIL/uL (ref 3.87–5.11)
RDW: 14.2 % (ref 11.5–15.5)
WBC: 13.9 K/uL — ABNORMAL HIGH (ref 4.0–10.5)
nRBC: 0 % (ref 0.0–0.2)

## 2024-07-10 LAB — LIPASE, BLOOD: Lipase: 36 U/L (ref 11–51)

## 2024-07-10 MED ORDER — HYDROMORPHONE HCL 1 MG/ML IJ SOLN
0.5000 mg | Freq: Once | INTRAMUSCULAR | Status: AC
  Administered 2024-07-10: 0.5 mg via INTRAVENOUS
  Filled 2024-07-10: qty 1

## 2024-07-10 MED ORDER — ONDANSETRON HCL 4 MG/2ML IJ SOLN
4.0000 mg | Freq: Once | INTRAMUSCULAR | Status: AC
  Administered 2024-07-10: 4 mg via INTRAVENOUS
  Filled 2024-07-10: qty 2

## 2024-07-10 MED ORDER — IOHEXOL 300 MG/ML  SOLN
100.0000 mL | Freq: Once | INTRAMUSCULAR | Status: AC | PRN
  Administered 2024-07-10: 100 mL via INTRAVENOUS

## 2024-07-10 MED ORDER — LIDOCAINE-EPINEPHRINE-TETRACAINE (LET) TOPICAL GEL
3.0000 mL | Freq: Once | TOPICAL | Status: AC
  Administered 2024-07-10: 3 mL via TOPICAL
  Filled 2024-07-10: qty 3

## 2024-07-10 MED ORDER — FLEET ENEMA RE ENEM
1.0000 | ENEMA | Freq: Once | RECTAL | Status: AC
  Administered 2024-07-10: 1 via RECTAL
  Filled 2024-07-10: qty 1

## 2024-07-10 MED ORDER — MAGNESIUM CITRATE PO SOLN
1.0000 | Freq: Once | ORAL | Status: AC
  Administered 2024-07-10: 1 via ORAL
  Filled 2024-07-10: qty 296

## 2024-07-10 NOTE — ED Notes (Signed)
 Pt was asked if she could provide a urine sample, pt said no

## 2024-07-10 NOTE — Discharge Instructions (Signed)
 You were seen in the Emergency Department for constipation again Constipation was relieved after magnesium  citrate and an enema here You need to keep up with your bowel regimen at home Increase your dosing of MiraLAX  to achieve 1/2 soft bowel movements daily Continue your regimen for hemorrhoids Follow-up with your GI doctor and primary care doctor Return to the emergency room for severe constipation

## 2024-07-10 NOTE — ED Triage Notes (Signed)
 Patient has not had a bowel movement in 5 days. Tried over the counter laxatives. Stated her hemorrhoids are so swollen. Feels nauseous.

## 2024-07-10 NOTE — ED Provider Triage Note (Signed)
 Emergency Medicine Provider Triage Evaluation Note  Kelly Taylor , a 73 y.o. female  was evaluated in triage.  Pt complains of possible constipation, recently seen for same. Has not had bowel movement in 5 days, previously enema worked. Has tried OTC therapy with no relief, also states she has hemorrhoids. Appreciates nausea. Denies vomiting, fever, chills, chest pain, shortness of breath.   Review of Systems  Positive: Abdominal pain, nausea, constipation Negative: Fever, chills, chest pain, shortness of breath, urinary symptoms  Physical Exam  BP 127/74   Pulse 97   Temp 98.1 F (36.7 C) (Oral)   Resp 19   Ht 5' 7 (1.702 m)   Wt 127 kg   SpO2 95%   BMI 43.85 kg/m  Gen:   Awake, no distress   Resp:  Normal effort  MSK:   Moves extremities without difficulty  Other:  Belly soft but generalized tenderness  Medical Decision Making  Medically screening exam initiated at 7:56 PM.  Appropriate orders placed.  Kelly Taylor was informed that the remainder of the evaluation will be completed by another provider, this initial triage assessment does not replace that evaluation, and the importance of remaining in the ED until their evaluation is complete.  Orders: Abdominal pain labs, CT abdomen pelvis ordered, dilaudid  for pain, zofran  for nausea   Janetta Terrall FALCON, [place] 07/11/24 [phone]

## 2024-07-10 NOTE — ED Provider Notes (Signed)
  EMERGENCY DEPARTMENT AT [facility] Provider Note   CSN: [id number] Arrival date & time: 07/10/24  1745     Patient presents with: Constipation   Kelly Taylor is a 73 y.o. female.  With a history of constipation who presents to ED for constipation.  Patient was recently seen in the [facility] ED 5 days ago for constipation.  Resolved after enema there.  She has struggled between constipation and external hemorrhoids.  Bowel regimen at home has been ineffective.  Has not had a bowel movement 5 days since being seen at [facility].  No fever chills nausea or vomiting.    Constipation      Prior to Admission medications   Medication Sig Start Date End Date Taking? Authorizing Provider  albuterol  (PROVENTIL  HFA;VENTOLIN  HFA) 108 (90 BASE) MCG/ACT inhaler Inhale 2 puffs into the lungs every 4 (four) hours as needed for wheezing or shortness of breath. Patient not taking: Reported on 05/04/2024 11/15/15   Belvie Dempsey BROCKS, PA  aspirin  EC 81 MG tablet Take 81 mg by mouth daily. Swallow whole.    [provider]  chlorhexidine  (HIBICLENS ) 4 % external liquid Apply 15 mLs (1 Application total) topically as directed for 30 doses. Use as directed daily for 5 days every other week for 6 weeks. Patient not taking: Reported on 05/04/2024 07/19/23   P[place]field, [facility], PA-C  cyclobenzaprine  (FLEXERIL ) 10 MG tablet Take 1 tablet (10 mg total) by mouth 3 (three) times daily as needed for muscle spasms. 07/19/23   Porterfield, Amber, PA-C  diclofenac sodium (VOLTAREN) 1 % GEL Apply 2 g topically 4 (four) times daily as needed (for pain).    [provider]  docusate sodium  (COLACE) 100 MG capsule Take 200 mg by mouth every evening.    [provider]  docusate sodium  (COLACE) 100 MG capsule Take 1 capsule (100 mg total) by mouth every 12 (twelve) hours. 07/04/24   Rancour, Garnette, MD  furosemide (LASIX) 40 MG tablet Take 40 mg by mouth daily as needed (when  the feet swell).    [provider]  gabapentin (NEURONTIN) 100 MG capsule Take 300 mg by mouth in the morning. 07/21/19   [provider]  hydrocortisone  (ANUSOL -HC) 2.5 % rectal cream Place 1 Application rectally 2 (two) times daily. 07/04/24   Rancour, Garnette, MD  imipramine (TOFRANIL) 25 MG tablet Take 25 mg by mouth in the morning and at bedtime.    [provider]  ipratropium (ATROVENT) 0.06 % nasal spray Place 2 sprays into both nostrils 4 (four) times daily as needed (for seasonal allergies).    [provider]  LORazepam  (ATIVAN ) 0.5 MG tablet Take 1 tablet (0.5 mg total) by mouth 3 (three) times daily as needed (vertigo). 05/05/24   Raford Lenis, MD  magnesium  oxide (MAG-OX) 400 (240 Mg) MG tablet Take 400 mg by mouth every evening.    [provider]  meclizine  (ANTIVERT ) 25 MG tablet Take 1 tablet (25 mg total) by mouth 3 (three) times daily as needed for dizziness. 05/05/24   Raford Lenis, MD  meloxicam  (MOBIC ) 15 MG tablet Take 15 mg by mouth daily with breakfast.    [provider]  metFORMIN  (GLUCOPHAGE -XR) 500 MG 24 hr tablet Take 500 mg by mouth in the morning and at bedtime.    [provider]  Misc Natural Products (FLEX-A-MIN JOINT FLEX PO) Take 1 tablet by mouth in the morning. Instaflex Advanced Joint Support    [provider]  Multiple Vitamins-Minerals (WOMENS MULTIVITAMIN) TABS Take 1 tablet by mouth daily with breakfast.    [provider]  nitrofurantoin , macrocrystal-monohydrate, (MACROBID ) 100 MG capsule Take 1 capsule (100 mg total) by mouth 2 (two) times daily. 07/04/24   Carita Senior, MD  NON FORMULARY Take 2 tablets by mouth See admin instructions. WEEM Hair Skin and Nails Gummies with Biotin- Chew 2 gummies by mouth once a day    [provider]  nystatin (MYCOSTATIN) 100000 UNIT/ML suspension Take 5 mLs by mouth 4 (four) times daily as needed (for tongue pain- as directed).     [provider]  Omega-3 Fatty Acids (FISH OIL PO) Take 2 capsules by mouth daily after breakfast.    [provider]  omeprazole  (PRILOSEC) 20 MG capsule Take 20 mg by mouth daily before breakfast. 11/06/16   [provider]  polyethylene glycol (MIRALAX  / GLYCOLAX ) packet Take 17 g by mouth daily.    [provider]  Probiotic Product (PROBIOTIC PO) Take 1 capsule by mouth every evening.    [provider]  simvastatin  (ZOCOR ) 10 MG tablet Take 10 mg by mouth at bedtime.    [provider]  solifenacin (VESICARE) 10 MG tablet Take 10 mg by mouth in the morning.    [provider]  SYNTHROID  75 MCG tablet Take 75 mcg by mouth daily before breakfast.    [provider]  traMADol  (ULTRAM ) 50 MG tablet Take 50 mg by mouth 3 (three) times daily as needed (for pain).    [provider]    Allergies: Oxycodone , Penicillins, Trazodone, Lyrica [pregabalin], Nabumetone, and Sulfa antibiotics    Review of Systems  Gastrointestinal:  Positive for constipation.    Updated Vital Signs BP 120/84   Pulse 96   Temp 98.2 F (36.8 C)   Resp 17   Ht 5' 7 (1.702 m)   Wt 127 kg   SpO2 95%   BMI 43.85 kg/m   Physical Exam Vitals and nursing note reviewed.  HENT:     Head: Normocephalic and atraumatic.  Eyes:     Pupils: Pupils are equal, round, and reactive to light.  Cardiovascular:     Rate and Rhythm: Normal rate and regular rhythm.  Pulmonary:     Effort: Pulmonary effort is normal.     Breath sounds: Normal breath sounds.  Abdominal:     General: There is distension.     Palpations: Abdomen is soft.     Tenderness: There is no abdominal tenderness.  Skin:    General: Skin is warm and dry.  Neurological:     Mental Status: She is alert.  Psychiatric:        Mood and Affect: Mood normal.     (all labs ordered are listed, but only abnormal results are displayed) Labs Reviewed  COMPREHENSIVE METABOLIC  PANEL WITH GFR - Abnormal; Notable for the following components:      Result Value   Glucose, Bld 184 (*)    All other components within normal limits  CBC - Abnormal; Notable for the following components:   WBC 13.9 (*)    All other components within normal limits  LIPASE, BLOOD    EKG: None  Radiology: CT ABDOMEN PELVIS W CONTRAST Result Date: 07/10/2024 CLINICAL DATA:  abdominal pain, elevated WBC count EXAM: CT ABDOMEN AND PELVIS WITH CONTRAST TECHNIQUE: Multidetector CT imaging of the abdomen and pelvis was performed using the standard protocol following bolus administration of intravenous contrast.  RADIATION DOSE REDUCTION: This exam was performed according to the departmental dose-optimization program which includes automated exposure control, adjustment of the mA and/or kV according to patient size and/or use of iterative reconstruction technique. CONTRAST:  OMNIPAQUE  IOHEXOL  300 MG/ML  SOLN COMPARISON:  07/04/2024 FINDINGS: Lower chest: Scarring or atelectasis in the lung bases. No acute findings. No effusions. Hepatobiliary: Prior cholecystectomy. Mildly dilated common bile duct measuring 13 mm, stable since prior study compatible post cholecystectomy state. No focal hepatic abnormality. Pancreas: No focal abnormality or ductal dilatation. Spleen: Enhancing lesions in the spleen, the largest 2.5 cm are stable since prior study most compatible with hemangiomas. Normal size. Adrenals/Urinary Tract: Adrenal glands normal. 9 cm cyst off the lower pole of the left kidney which appears simple. No follow-up imaging recommended. No hydronephrosis. Urinary bladder unremarkable. Stomach/Bowel: Large stool burden throughout the colon, most pronounced in the rectosigmoid colon. Cannot exclude fecal impaction. Stomach and small bowel decompressed. No inflammatory process. Vascular/Lymphatic: No evidence of aneurysm or adenopathy. Reproductive: Prior hysterectomy.  No adnexal masses. Other: No free  fluid or free air. Musculoskeletal: Prior right hip replacement. Posterior fusion changes in the lower lumbar spine. Diffuse degenerative disc and facet disease throughout the lumbar spine. No acute bony abnormality. InterStim device in place. IMPRESSION: Large stool burden throughout the colon, particularly rectosigmoid colon. Cannot exclude constipation and fecal impaction. No acute findings in the abdomen or pelvis. Electronically Signed   By: Franky Crease M.D.   On: 07/10/2024 21:09     Procedures   Medications Ordered in the ED  HYDROmorphone  (DILAUDID ) injection 0.5 mg (0.5 mg Intravenous Given 07/10/24 2020)  ondansetron  (ZOFRAN ) injection 4 mg (4 mg Intravenous Given 07/10/24 2019)  iohexol  (OMNIPAQUE ) 300 MG/ML solution 100 mL (100 mLs Intravenous Contrast Given 07/10/24 2101)  HYDROmorphone  (DILAUDID ) injection 0.5 mg (0.5 mg Intravenous Given 07/10/24 2227)  sodium phosphate  (FLEET) enema 1 enema (1 enema Rectal Given 07/10/24 2229)  magnesium  citrate solution 1 Bottle (1 Bottle Oral Given 07/10/24 2229)  lidocaine -EPINEPHrine -tetracaine  (LET) topical gel (3 mLs Topical Given 07/10/24 2237)                                    Medical Decision Making 73 year old female returns for constipation.  Was seen 5 days ago in the [facility] ED for constipation.  Was struggled between constipation and management of her external hemorrhoids.  Some nausea.  No fevers chills or vomiting.  Able to tolerate p.o.  Will try magnesium  citrate and Fleet enema for relief of constipation.  Amount and/or Complexity of Data Reviewed Labs: ordered.  Risk OTC drugs. Prescription drug management.        Final diagnoses:  Constipation, unspecified constipation type    ED Discharge Orders     None          Pamella Ozell LABOR, DO 07/10/24 2343

## 2024-07-11 ENCOUNTER — Other Ambulatory Visit: Payer: Self-pay | Admitting: Obstetrics and Gynecology

## 2024-07-11 DIAGNOSIS — Z1231 Encounter for screening mammogram for malignant neoplasm of breast: Secondary | ICD-10-CM

## 2024-07-11 DIAGNOSIS — K5900 Constipation, unspecified: Secondary | ICD-10-CM | POA: Diagnosis not present

## 2024-07-11 MED ORDER — LIDOCAINE-EPINEPHRINE-TETRACAINE (LET) TOPICAL GEL
3.0000 mL | Freq: Once | TOPICAL | Status: AC
  Administered 2024-07-11: 3 mL via TOPICAL
  Filled 2024-07-11: qty 3

## 2024-07-11 MED ORDER — HYDROCORTISONE ACETATE 25 MG RE SUPP: 25.0000 mg | Freq: Two times a day (BID) | RECTAL | 0 refills | Status: DC

## 2024-07-11 MED ORDER — LACTULOSE 20 GM/30ML PO SOLN: 15.0000 mL | Freq: Every day | ORAL | 0 refills | Status: AC | PRN

## 2024-07-14 ENCOUNTER — Ambulatory Visit
Admission: RE | Admit: 2024-07-14 | Discharge: 2024-07-14 | Disposition: A | Discharge status: Home / Self Care | Source: Ambulatory Visit | Attending: Obstetrics and Gynecology | Admitting: Obstetrics and Gynecology

## 2024-07-14 DIAGNOSIS — Z1231 Encounter for screening mammogram for malignant neoplasm of breast: Secondary | ICD-10-CM | POA: Diagnosis not present

## 2024-07-15 DIAGNOSIS — K602 Anal fissure, unspecified: Secondary | ICD-10-CM | POA: Diagnosis not present

## 2024-07-15 DIAGNOSIS — K581 Irritable bowel syndrome with constipation: Secondary | ICD-10-CM | POA: Diagnosis not present

## 2024-07-15 DIAGNOSIS — K219 Gastro-esophageal reflux disease without esophagitis: Secondary | ICD-10-CM | POA: Diagnosis not present

## 2024-07-15 DIAGNOSIS — Z860100 Personal history of colon polyps, unspecified: Secondary | ICD-10-CM | POA: Diagnosis not present

## 2024-07-24 DIAGNOSIS — M791 Myalgia, unspecified site: Secondary | ICD-10-CM | POA: Diagnosis not present

## 2024-07-24 DIAGNOSIS — M25611 Stiffness of right shoulder, not elsewhere classified: Secondary | ICD-10-CM | POA: Diagnosis not present

## 2024-07-30 DIAGNOSIS — E118 Type 2 diabetes mellitus with unspecified complications: Secondary | ICD-10-CM | POA: Diagnosis not present

## 2024-07-30 DIAGNOSIS — E785 Hyperlipidemia, unspecified: Secondary | ICD-10-CM | POA: Diagnosis not present

## 2024-07-30 DIAGNOSIS — K219 Gastro-esophageal reflux disease without esophagitis: Secondary | ICD-10-CM | POA: Diagnosis not present

## 2024-07-30 DIAGNOSIS — E039 Hypothyroidism, unspecified: Secondary | ICD-10-CM | POA: Diagnosis not present

## 2024-08-05 DIAGNOSIS — Z981 Arthrodesis status: Secondary | ICD-10-CM | POA: Diagnosis not present

## 2024-08-05 DIAGNOSIS — M479 Spondylosis, unspecified: Secondary | ICD-10-CM | POA: Diagnosis not present

## 2024-08-05 DIAGNOSIS — M5416 Radiculopathy, lumbar region: Secondary | ICD-10-CM | POA: Diagnosis not present

## 2024-08-05 DIAGNOSIS — M5116 Intervertebral disc disorders with radiculopathy, lumbar region: Secondary | ICD-10-CM | POA: Diagnosis not present

## 2024-08-06 DIAGNOSIS — Z23 Encounter for immunization: Secondary | ICD-10-CM | POA: Diagnosis not present

## 2024-08-06 DIAGNOSIS — E039 Hypothyroidism, unspecified: Secondary | ICD-10-CM | POA: Diagnosis not present

## 2024-08-06 DIAGNOSIS — R42 Dizziness and giddiness: Secondary | ICD-10-CM | POA: Diagnosis not present

## 2024-08-06 DIAGNOSIS — E118 Type 2 diabetes mellitus with unspecified complications: Secondary | ICD-10-CM | POA: Diagnosis not present

## 2024-08-06 DIAGNOSIS — E785 Hyperlipidemia, unspecified: Secondary | ICD-10-CM | POA: Diagnosis not present

## 2024-08-06 DIAGNOSIS — M159 Polyosteoarthritis, unspecified: Secondary | ICD-10-CM | POA: Diagnosis not present

## 2024-08-06 DIAGNOSIS — N3281 Overactive bladder: Secondary | ICD-10-CM | POA: Diagnosis not present

## 2024-08-06 DIAGNOSIS — M5126 Other intervertebral disc displacement, lumbar region: Secondary | ICD-10-CM | POA: Diagnosis not present

## 2024-08-06 DIAGNOSIS — K5900 Constipation, unspecified: Secondary | ICD-10-CM | POA: Diagnosis not present

## 2024-08-06 DIAGNOSIS — K219 Gastro-esophageal reflux disease without esophagitis: Secondary | ICD-10-CM | POA: Diagnosis not present

## 2024-08-21 DIAGNOSIS — M4316 Spondylolisthesis, lumbar region: Secondary | ICD-10-CM | POA: Diagnosis not present

## 2024-08-21 DIAGNOSIS — M48061 Spinal stenosis, lumbar region without neurogenic claudication: Secondary | ICD-10-CM | POA: Diagnosis not present

## 2024-08-21 DIAGNOSIS — M5416 Radiculopathy, lumbar region: Secondary | ICD-10-CM | POA: Diagnosis not present

## 2024-08-21 DIAGNOSIS — M5417 Radiculopathy, lumbosacral region: Secondary | ICD-10-CM | POA: Diagnosis not present

## 2024-08-27 ENCOUNTER — Emergency Department (HOSPITAL_COMMUNITY)

## 2024-08-27 ENCOUNTER — Encounter (HOSPITAL_COMMUNITY): Payer: Self-pay

## 2024-08-27 ENCOUNTER — Inpatient Hospital Stay (HOSPITAL_COMMUNITY)
Admission: EM | Admit: 2024-08-27 | Discharge: 2024-09-02 | DRG: 480 | Disposition: A | Discharge status: Skilled Nursing Facility | Attending: Internal Medicine | Admitting: Internal Medicine

## 2024-08-27 DIAGNOSIS — Z801 Family history of malignant neoplasm of trachea, bronchus and lung: Secondary | ICD-10-CM

## 2024-08-27 DIAGNOSIS — I77810 Thoracic aortic ectasia: Secondary | ICD-10-CM | POA: Diagnosis present

## 2024-08-27 DIAGNOSIS — W010XXA Fall on same level from slipping, tripping and stumbling without subsequent striking against object, initial encounter: Secondary | ICD-10-CM | POA: Diagnosis present

## 2024-08-27 DIAGNOSIS — T07XXXA Unspecified multiple injuries, initial encounter: Secondary | ICD-10-CM | POA: Diagnosis not present

## 2024-08-27 DIAGNOSIS — Z90710 Acquired absence of both cervix and uterus: Secondary | ICD-10-CM

## 2024-08-27 DIAGNOSIS — T84019A Broken internal joint prosthesis, unspecified site, initial encounter: Secondary | ICD-10-CM

## 2024-08-27 DIAGNOSIS — S72451A Displaced supracondylar fracture without intracondylar extension of lower end of right femur, initial encounter for closed fracture: Secondary | ICD-10-CM | POA: Diagnosis not present

## 2024-08-27 DIAGNOSIS — R2689 Other abnormalities of gait and mobility: Secondary | ICD-10-CM | POA: Diagnosis not present

## 2024-08-27 DIAGNOSIS — M6281 Muscle weakness (generalized): Secondary | ICD-10-CM | POA: Diagnosis not present

## 2024-08-27 DIAGNOSIS — R609 Edema, unspecified: Secondary | ICD-10-CM | POA: Diagnosis not present

## 2024-08-27 DIAGNOSIS — D62 Acute posthemorrhagic anemia: Secondary | ICD-10-CM | POA: Diagnosis not present

## 2024-08-27 DIAGNOSIS — M9711XA Periprosthetic fracture around internal prosthetic right knee joint, initial encounter: Secondary | ICD-10-CM | POA: Diagnosis present

## 2024-08-27 DIAGNOSIS — S7291XA Unspecified fracture of right femur, initial encounter for closed fracture: Secondary | ICD-10-CM | POA: Diagnosis not present

## 2024-08-27 DIAGNOSIS — Z96641 Presence of right artificial hip joint: Secondary | ICD-10-CM | POA: Diagnosis not present

## 2024-08-27 DIAGNOSIS — E1169 Type 2 diabetes mellitus with other specified complication: Secondary | ICD-10-CM | POA: Diagnosis not present

## 2024-08-27 DIAGNOSIS — I7121 Aneurysm of the ascending aorta, without rupture: Secondary | ICD-10-CM | POA: Diagnosis present

## 2024-08-27 DIAGNOSIS — J45909 Unspecified asthma, uncomplicated: Secondary | ICD-10-CM | POA: Diagnosis not present

## 2024-08-27 DIAGNOSIS — M19031 Primary osteoarthritis, right wrist: Secondary | ICD-10-CM | POA: Diagnosis not present

## 2024-08-27 DIAGNOSIS — Z823 Family history of stroke: Secondary | ICD-10-CM

## 2024-08-27 DIAGNOSIS — Z79890 Hormone replacement therapy (postmenopausal): Secondary | ICD-10-CM | POA: Diagnosis not present

## 2024-08-27 DIAGNOSIS — M9711XD Periprosthetic fracture around internal prosthetic right knee joint, subsequent encounter: Secondary | ICD-10-CM | POA: Diagnosis not present

## 2024-08-27 DIAGNOSIS — Z888 Allergy status to other drugs, medicaments and biological substances status: Secondary | ICD-10-CM

## 2024-08-27 DIAGNOSIS — E039 Hypothyroidism, unspecified: Secondary | ICD-10-CM | POA: Diagnosis present

## 2024-08-27 DIAGNOSIS — Z880 Allergy status to penicillin: Secondary | ICD-10-CM | POA: Diagnosis not present

## 2024-08-27 DIAGNOSIS — E7849 Other hyperlipidemia: Secondary | ICD-10-CM | POA: Diagnosis present

## 2024-08-27 DIAGNOSIS — R278 Other lack of coordination: Secondary | ICD-10-CM | POA: Diagnosis not present

## 2024-08-27 DIAGNOSIS — Z7982 Long term (current) use of aspirin: Secondary | ICD-10-CM

## 2024-08-27 DIAGNOSIS — M4312 Spondylolisthesis, cervical region: Secondary | ICD-10-CM | POA: Diagnosis not present

## 2024-08-27 DIAGNOSIS — J9601 Acute respiratory failure with hypoxia: Secondary | ICD-10-CM | POA: Diagnosis not present

## 2024-08-27 DIAGNOSIS — Z885 Allergy status to narcotic agent status: Secondary | ICD-10-CM

## 2024-08-27 DIAGNOSIS — S72401D Unspecified fracture of lower end of right femur, subsequent encounter for closed fracture with routine healing: Secondary | ICD-10-CM | POA: Diagnosis not present

## 2024-08-27 DIAGNOSIS — B961 Klebsiella pneumoniae [K. pneumoniae] as the cause of diseases classified elsewhere: Secondary | ICD-10-CM | POA: Diagnosis not present

## 2024-08-27 DIAGNOSIS — Z96612 Presence of left artificial shoulder joint: Secondary | ICD-10-CM | POA: Diagnosis not present

## 2024-08-27 DIAGNOSIS — B9689 Other specified bacterial agents as the cause of diseases classified elsewhere: Secondary | ICD-10-CM | POA: Diagnosis not present

## 2024-08-27 DIAGNOSIS — Z96652 Presence of left artificial knee joint: Secondary | ICD-10-CM | POA: Diagnosis not present

## 2024-08-27 DIAGNOSIS — Z8249 Family history of ischemic heart disease and other diseases of the circulatory system: Secondary | ICD-10-CM | POA: Diagnosis not present

## 2024-08-27 DIAGNOSIS — M978XXA Periprosthetic fracture around other internal prosthetic joint, initial encounter: Secondary | ICD-10-CM | POA: Diagnosis not present

## 2024-08-27 DIAGNOSIS — N390 Urinary tract infection, site not specified: Secondary | ICD-10-CM | POA: Diagnosis present

## 2024-08-27 DIAGNOSIS — R600 Localized edema: Secondary | ICD-10-CM | POA: Diagnosis not present

## 2024-08-27 DIAGNOSIS — R0902 Hypoxemia: Secondary | ICD-10-CM | POA: Diagnosis not present

## 2024-08-27 DIAGNOSIS — G4733 Obstructive sleep apnea (adult) (pediatric): Secondary | ICD-10-CM | POA: Diagnosis not present

## 2024-08-27 DIAGNOSIS — Z96649 Presence of unspecified artificial hip joint: Secondary | ICD-10-CM | POA: Diagnosis present

## 2024-08-27 DIAGNOSIS — E872 Acidosis, unspecified: Secondary | ICD-10-CM | POA: Diagnosis not present

## 2024-08-27 DIAGNOSIS — E119 Type 2 diabetes mellitus without complications: Secondary | ICD-10-CM | POA: Diagnosis not present

## 2024-08-27 DIAGNOSIS — Z860100 Personal history of colon polyps, unspecified: Secondary | ICD-10-CM

## 2024-08-27 DIAGNOSIS — Z7401 Bed confinement status: Secondary | ICD-10-CM | POA: Diagnosis not present

## 2024-08-27 DIAGNOSIS — R040 Epistaxis: Secondary | ICD-10-CM | POA: Diagnosis present

## 2024-08-27 DIAGNOSIS — I719 Aortic aneurysm of unspecified site, without rupture: Secondary | ICD-10-CM | POA: Diagnosis not present

## 2024-08-27 DIAGNOSIS — S01511A Laceration without foreign body of lip, initial encounter: Secondary | ICD-10-CM | POA: Diagnosis not present

## 2024-08-27 DIAGNOSIS — E66813 Obesity, class 3: Secondary | ICD-10-CM | POA: Diagnosis present

## 2024-08-27 DIAGNOSIS — S0990XA Unspecified injury of head, initial encounter: Secondary | ICD-10-CM | POA: Diagnosis not present

## 2024-08-27 DIAGNOSIS — W19XXXD Unspecified fall, subsequent encounter: Secondary | ICD-10-CM | POA: Diagnosis not present

## 2024-08-27 DIAGNOSIS — S80919A Unspecified superficial injury of unspecified knee, initial encounter: Secondary | ICD-10-CM | POA: Diagnosis not present

## 2024-08-27 DIAGNOSIS — Y92009 Unspecified place in unspecified non-institutional (private) residence as the place of occurrence of the external cause: Secondary | ICD-10-CM | POA: Diagnosis not present

## 2024-08-27 DIAGNOSIS — W19XXXA Unspecified fall, initial encounter: Secondary | ICD-10-CM | POA: Diagnosis not present

## 2024-08-27 DIAGNOSIS — M48061 Spinal stenosis, lumbar region without neurogenic claudication: Secondary | ICD-10-CM | POA: Diagnosis not present

## 2024-08-27 DIAGNOSIS — S86999A Other injury of unspecified muscle(s) and tendon(s) at lower leg level, unspecified leg, initial encounter: Secondary | ICD-10-CM | POA: Diagnosis not present

## 2024-08-27 DIAGNOSIS — N3281 Overactive bladder: Secondary | ICD-10-CM | POA: Diagnosis not present

## 2024-08-27 DIAGNOSIS — Z79899 Other long term (current) drug therapy: Secondary | ICD-10-CM

## 2024-08-27 DIAGNOSIS — I771 Stricture of artery: Secondary | ICD-10-CM | POA: Diagnosis not present

## 2024-08-27 DIAGNOSIS — I10 Essential (primary) hypertension: Secondary | ICD-10-CM | POA: Diagnosis not present

## 2024-08-27 DIAGNOSIS — K589 Irritable bowel syndrome without diarrhea: Secondary | ICD-10-CM | POA: Diagnosis not present

## 2024-08-27 DIAGNOSIS — E785 Hyperlipidemia, unspecified: Secondary | ICD-10-CM | POA: Diagnosis not present

## 2024-08-27 DIAGNOSIS — S72401A Unspecified fracture of lower end of right femur, initial encounter for closed fracture: Principal | ICD-10-CM

## 2024-08-27 DIAGNOSIS — I712 Thoracic aortic aneurysm, without rupture, unspecified: Secondary | ICD-10-CM | POA: Diagnosis not present

## 2024-08-27 DIAGNOSIS — E1151 Type 2 diabetes mellitus with diabetic peripheral angiopathy without gangrene: Secondary | ICD-10-CM | POA: Diagnosis not present

## 2024-08-27 DIAGNOSIS — Z91199 Patient's noncompliance with other medical treatment and regimen due to unspecified reason: Secondary | ICD-10-CM

## 2024-08-27 DIAGNOSIS — Z96611 Presence of right artificial shoulder joint: Secondary | ICD-10-CM | POA: Diagnosis present

## 2024-08-27 DIAGNOSIS — M25551 Pain in right hip: Secondary | ICD-10-CM | POA: Diagnosis not present

## 2024-08-27 DIAGNOSIS — Z818 Family history of other mental and behavioral disorders: Secondary | ICD-10-CM

## 2024-08-27 DIAGNOSIS — Z833 Family history of diabetes mellitus: Secondary | ICD-10-CM

## 2024-08-27 DIAGNOSIS — Z7984 Long term (current) use of oral hypoglycemic drugs: Secondary | ICD-10-CM | POA: Diagnosis not present

## 2024-08-27 DIAGNOSIS — Z9049 Acquired absence of other specified parts of digestive tract: Secondary | ICD-10-CM

## 2024-08-27 DIAGNOSIS — G8929 Other chronic pain: Secondary | ICD-10-CM | POA: Diagnosis not present

## 2024-08-27 DIAGNOSIS — Z8041 Family history of malignant neoplasm of ovary: Secondary | ICD-10-CM

## 2024-08-27 DIAGNOSIS — Z01818 Encounter for other preprocedural examination: Secondary | ICD-10-CM | POA: Diagnosis not present

## 2024-08-27 DIAGNOSIS — M25561 Pain in right knee: Secondary | ICD-10-CM | POA: Diagnosis not present

## 2024-08-27 DIAGNOSIS — Z6841 Body Mass Index (BMI) 40.0 and over, adult: Secondary | ICD-10-CM | POA: Diagnosis not present

## 2024-08-27 DIAGNOSIS — D72829 Elevated white blood cell count, unspecified: Secondary | ICD-10-CM

## 2024-08-27 DIAGNOSIS — K219 Gastro-esophageal reflux disease without esophagitis: Secondary | ICD-10-CM | POA: Diagnosis present

## 2024-08-27 DIAGNOSIS — F339 Major depressive disorder, recurrent, unspecified: Secondary | ICD-10-CM | POA: Diagnosis not present

## 2024-08-27 DIAGNOSIS — R1030 Lower abdominal pain, unspecified: Secondary | ICD-10-CM | POA: Diagnosis not present

## 2024-08-27 DIAGNOSIS — Z882 Allergy status to sulfonamides status: Secondary | ICD-10-CM

## 2024-08-27 DIAGNOSIS — Z96651 Presence of right artificial knee joint: Secondary | ICD-10-CM | POA: Diagnosis not present

## 2024-08-27 DIAGNOSIS — D649 Anemia, unspecified: Secondary | ICD-10-CM | POA: Diagnosis not present

## 2024-08-27 DIAGNOSIS — Z8619 Personal history of other infectious and parasitic diseases: Secondary | ICD-10-CM

## 2024-08-27 DIAGNOSIS — Z803 Family history of malignant neoplasm of breast: Secondary | ICD-10-CM

## 2024-08-27 DIAGNOSIS — M1811 Unilateral primary osteoarthritis of first carpometacarpal joint, right hand: Secondary | ICD-10-CM | POA: Diagnosis not present

## 2024-08-27 LAB — CBC
HCT: 41.7 % (ref 36.0–46.0)
Hemoglobin: 12.5 g/dL (ref 12.0–15.0)
MCH: 27.5 pg (ref 26.0–34.0)
MCHC: 30 g/dL (ref 30.0–36.0)
MCV: 91.9 fL (ref 80.0–100.0)
Platelets: 284 K/uL (ref 150–400)
RBC: 4.54 MIL/uL (ref 3.87–5.11)
RDW: 13.7 % (ref 11.5–15.5)
WBC: 13.7 K/uL — ABNORMAL HIGH (ref 4.0–10.5)
nRBC: 0 % (ref 0.0–0.2)

## 2024-08-27 LAB — I-STAT CHEM 8, ED
BUN: 16 mg/dL (ref 8–23)
Calcium, Ion: 1.22 mmol/L (ref 1.15–1.40)
Chloride: 104 mmol/L (ref 98–111)
Creatinine, Ser: 0.7 mg/dL (ref 0.44–1.00)
Glucose, Bld: 180 mg/dL — ABNORMAL HIGH (ref 70–99)
HCT: 40 % (ref 36.0–46.0)
Hemoglobin: 13.6 g/dL (ref 12.0–15.0)
Potassium: 3.8 mmol/L (ref 3.5–5.1)
Sodium: 141 mmol/L (ref 135–145)
TCO2: 24 mmol/L (ref 22–32)

## 2024-08-27 LAB — I-STAT CG4 LACTIC ACID, ED: Lactic Acid, Venous: 3 mmol/L (ref 0.5–1.9)

## 2024-08-27 MED ORDER — IOHEXOL 300 MG/ML  SOLN
100.0000 mL | Freq: Once | INTRAMUSCULAR | Status: AC | PRN
  Administered 2024-08-27: 100 mL via INTRAVENOUS

## 2024-08-27 MED ORDER — ONDANSETRON HCL 4 MG/2ML IJ SOLN
4.0000 mg | Freq: Once | INTRAMUSCULAR | Status: AC
  Administered 2024-08-27: 4 mg via INTRAVENOUS
  Filled 2024-08-27: qty 2

## 2024-08-27 MED ORDER — ONDANSETRON HCL 4 MG/2ML IJ SOLN
4.0000 mg | Freq: Once | INTRAMUSCULAR | Status: AC
  Administered 2024-08-28: 4 mg via INTRAVENOUS
  Filled 2024-08-27: qty 2

## 2024-08-27 MED ORDER — HYDROMORPHONE HCL 1 MG/ML IJ SOLN
0.5000 mg | Freq: Once | INTRAMUSCULAR | Status: AC
Start: 2024-08-27 — End: 2024-08-28
  Administered 2024-08-28: 0.5 mg via INTRAVENOUS
  Filled 2024-08-27: qty 1

## 2024-08-27 MED ORDER — FENTANYL CITRATE PF 50 MCG/ML IJ SOSY
50.0000 ug | PREFILLED_SYRINGE | Freq: Once | INTRAMUSCULAR | Status: AC
  Administered 2024-08-27: 50 ug via INTRAVENOUS
  Filled 2024-08-27: qty 1

## 2024-08-27 NOTE — ED Notes (Signed)
 Pt. I-stat Lactic CG4 results 3.0, MD, Schlossman made aware and RN, Amani.

## 2024-08-27 NOTE — ED Provider Notes (Signed)
 [place] EMERGENCY DEPARTMENT AT [facility] Provider Note   CSN: [id number] Arrival date & time: 08/27/24  2100     Patient presents with: Kelly Taylor is a 73 y.o. female.  {Add pertinent medical, surgical, social history, OB history to YEP:[id number]} HPI      Past Medical History:  Diagnosis Date   Anemia    hx of in 20's   Aortic atherosclerosis    Arthritis    Ascending aorta dilatation    42mm by CT 10/23   Asthma    Back pain    Breast mass, left 2003   Chronic back pain    seeing Pain specialist   Complication of anesthesia    shallow breathing with general   Constipation    DDD (degenerative disc disease)    Depression    Diabetes mellitus    borderline   DJD (degenerative joint disease)    Family history of heart attack    GERD (gastroesophageal reflux disease)    H/O scarlet fever    Hemorrhoids    History of chicken pox    History of measles, mumps, or rubella as child   all 3   Hx of bladder infections    Hypothyroidism    Irritable bowel syndrome    Joint pain    Lumbar stenosis    RFA done May 2015   Menopausal symptoms 2003   Obesity, morbid (HCC) 2006   Pericarditis    hx of   Pneumonia    hx of 5 years ago   Polyp of colon    removed during colonoscopy   SOBOE (shortness of breath on exertion)    Spinal headache    x1   Thyroid  condition    Trichomonas    Urge incontinence 2005   Vertigo    over a year was last episode   Yeast in stool      Prior to Admission medications   Medication Sig Start Date End Date Taking? Authorizing Provider  albuterol  (PROVENTIL  HFA;VENTOLIN  HFA) 108 (90 BASE) MCG/ACT inhaler Inhale 2 puffs into the lungs every 4 (four) hours as needed for wheezing or shortness of breath. Patient not taking: Reported on 05/04/2024 11/15/15   Belvie Dempsey BROCKS, PA  aspirin  EC 81 MG tablet Take 81 mg by mouth daily. Swallow whole.    [provider]  chlorhexidine  (HIBICLENS ) 4 %  external liquid Apply 15 mLs (1 Application total) topically as directed for 30 doses. Use as directed daily for 5 days every other week for 6 weeks. Patient not taking: Reported on 05/04/2024 07/19/23   Porterfield, [facility], PA-C  cyclobenzaprine  (FLEXERIL ) 10 MG tablet Take 1 tablet (10 mg total) by mouth 3 (three) times daily as needed for muscle spasms. 07/19/23   Porterfield, Amber, PA-C  diclofenac sodium (VOLTAREN) 1 % GEL Apply 2 g topically 4 (four) times daily as needed (for pain).    [provider]  docusate sodium  (COLACE) 100 MG capsule Take 200 mg by mouth every evening.    [provider]  docusate sodium  (COLACE) 100 MG capsule Take 1 capsule (100 mg total) by mouth every 12 (twelve) hours. 07/04/24   Rancour, Garnette, MD  furosemide (LASIX) 40 MG tablet Take 40 mg by mouth daily as needed (when the feet swell).    [provider]  gabapentin (NEURONTIN) 100 MG capsule Take 300 mg by mouth in the morning. 07/21/19   [provider]  hydrocortisone  (  ANUSOL -HC) 2.5 % rectal cream Place 1 Application rectally 2 (two) times daily. 07/04/24   Rancour, Garnette, MD  hydrocortisone  (ANUSOL -HC) 25 MG suppository Place 1 suppository (25 mg total) rectally 2 (two) times daily. 07/11/24   Griselda Norris, MD  imipramine (TOFRANIL) 25 MG tablet Take 25 mg by mouth in the morning and at bedtime.    [provider]  ipratropium (ATROVENT) 0.06 % nasal spray Place 2 sprays into both nostrils 4 (four) times daily as needed (for seasonal allergies).    [provider]  Lactulose  20 GM/30ML SOLN Take 15 mLs (10 g total) by mouth daily as needed (constipation). 07/11/24   Griselda Norris, MD  LORazepam  (ATIVAN ) 0.5 MG tablet Take 1 tablet (0.5 mg total) by mouth 3 (three) times daily as needed (vertigo). 05/05/24   Raford Lenis, MD  magnesium  oxide (MAG-OX) 400 (240 Mg) MG tablet Take 400 mg by mouth every evening.    [provider]  meclizine   (ANTIVERT ) 25 MG tablet Take 1 tablet (25 mg total) by mouth 3 (three) times daily as needed for dizziness. 05/05/24   Raford Lenis, MD  meloxicam  (MOBIC ) 15 MG tablet Take 15 mg by mouth daily with breakfast.    [provider]  metFORMIN  (GLUCOPHAGE -XR) 500 MG 24 hr tablet Take 500 mg by mouth in the morning and at bedtime.    [provider]  Misc Natural Products (FLEX-A-MIN JOINT FLEX PO) Take 1 tablet by mouth in the morning. Instaflex Advanced Joint Support    [provider]  Multiple Vitamins-Minerals (WOMENS MULTIVITAMIN) TABS Take 1 tablet by mouth daily with breakfast.    [provider]  nitrofurantoin , macrocrystal-monohydrate, (MACROBID ) 100 MG capsule Take 1 capsule (100 mg total) by mouth 2 (two) times daily. 07/04/24   Carita Garnette, MD  NON FORMULARY Take 2 tablets by mouth See admin instructions. WEEM Hair Skin and Nails Gummies with Biotin- Chew 2 gummies by mouth once a day    [provider]  nystatin (MYCOSTATIN) 100000 UNIT/ML suspension Take 5 mLs by mouth 4 (four) times daily as needed (for tongue pain- as directed).    [provider]  Omega-3 Fatty Acids (FISH OIL PO) Take 2 capsules by mouth daily after breakfast.    [provider]  omeprazole  (PRILOSEC) 20 MG capsule Take 20 mg by mouth daily before breakfast. 11/06/16   [provider]  polyethylene glycol (MIRALAX  / GLYCOLAX ) packet Take 17 g by mouth daily.    [provider]  Probiotic Product (PROBIOTIC PO) Take 1 capsule by mouth every evening.    [provider]  simvastatin  (ZOCOR ) 10 MG tablet Take 10 mg by mouth at bedtime.    [provider]  solifenacin (VESICARE) 10 MG tablet Take 10 mg by mouth in the morning.    [provider]  SYNTHROID  75 MCG tablet Take 75 mcg by mouth daily before breakfast.    [provider]  traMADol  (ULTRAM ) 50 MG tablet Take 50 mg by mouth 3 (three) times daily  as needed (for pain).    [provider]    Allergies: Oxycodone , Penicillins, Trazodone, Lyrica [pregabalin], Nabumetone, and Sulfa antibiotics    Review of Systems  Updated Vital Signs BP (!) 108/96   Pulse 81   Temp 98.4 F (36.9 C) (Oral)   Resp 17   Ht 5' 7 (1.702 m)   Wt 127 kg   SpO2 93%   BMI 43.85 kg/m   Physical Exam  (  all labs ordered are listed, but only abnormal results are displayed) Labs Reviewed  I-STAT CHEM 8, ED - Abnormal; Notable for the following components:      Result Value   Glucose, Bld 180 (*)    All other components within normal limits  I-STAT CG4 LACTIC ACID, ED - Abnormal; Notable for the following components:   Lactic Acid, Venous 3.0 (*)    All other components within normal limits  COMPREHENSIVE METABOLIC PANEL WITH GFR  CBC  ETHANOL  URINALYSIS, ROUTINE W REFLEX MICROSCOPIC  PROTIME-INR  SAMPLE TO BLOOD BANK    EKG: None  Radiology: DG Chest Port 1 View Result Date: 08/27/2024 CLINICAL DATA:  Preoperative evaluation EXAM: PORTABLE CHEST 1 VIEW COMPARISON:  06/18/2023 FINDINGS: Cardiac shadow is within normal limits. Tortuous thoracic aorta is noted. Lungs are clear bilaterally. Bilateral shoulder replacements are seen. IMPRESSION: No acute abnormality noted. Electronically Signed   By: Oneil Devonshire M.D.   On: 08/27/2024 22:59   DG Knee 1-2 Views Right Result Date: 08/27/2024 CLINICAL DATA:  Recent fall with right knee pain, initial encounter EXAM: RIGHT KNEE - 2 VIEW COMPARISON:  None Available. FINDINGS: Right knee prosthesis is noted. Immediately superior to the prosthesis there is a transverse fracture through the distal femoral metaphysis. The distal fracture fragment is posteriorly displaced with respect proximal femoral shaft. IMPRESSION: Transverse fracture just above the patient's right knee prosthesis with posterior displacement of the distal femoral fracture. Electronically Signed   By: Oneil Devonshire M.D.   On:  08/27/2024 22:59   DG Hip Unilat W or Wo Pelvis 2-3 Views Right Result Date: 08/27/2024 CLINICAL DATA:  Recent fall with right hip pain, initial encounter EXAM: DG HIP (WITH OR WITHOUT PELVIS) 3V RIGHT COMPARISON:  07/08/2019 FINDINGS: Pelvic ring is intact. Right hip replacement is noted. Right InterStim device is seen. Postsurgical changes in the lower lumbar spine are noted. Mild degenerative changes of the left hip joint are seen. No acute fracture is noted. IMPRESSION: Chronic changes without acute abnormality. Electronically Signed   By: Oneil Devonshire M.D.   On: 08/27/2024 22:53    {Document cardiac monitor, telemetry assessment procedure when appropriate:32947} Procedures   Medications Ordered in the ED  iohexol  (OMNIPAQUE ) 300 MG/ML solution 100 mL (has no administration in time range)  ondansetron  (ZOFRAN ) injection 4 mg (4 mg Intravenous Given 08/27/24 2221)  fentaNYL  (SUBLIMAZE ) injection 50 mcg (50 mcg Intravenous Given 08/27/24 2222)      {Click here for ABCD2, HEART and other calculators REFRESH Note before signing:1}                              Medical Decision Making Amount and/or Complexity of Data Reviewed Labs: ordered. Radiology: ordered.  Risk Prescription drug management.   ***  {Document critical care time when appropriate  Document review of labs and clinical decision tools ie CHADS2VASC2, etc  Document your independent review of radiology images and any outside records  Document your discussion with family members, caretakers and with consultants  Document social determinants of health affecting pt's care  Document your decision making why or why not admission, treatments were needed:32947:::1}   Final diagnoses:  None    ED Discharge Orders     None

## 2024-08-27 NOTE — Consult Note (Signed)
 Reason for Consult:right thigh pain after fall Referring Physician: EDP  Kelly Taylor is an 73 y.o. female.  HPI: Patient who is 11 years out from right knee TKA presents after fall when she tripped over pavers at home. She reports immediate severe right knee pain after the fall and unable to ambulate after the fall. She also has had left TKA, right THA([place]) and bilateral reverse TSAs Russel). No other complaints  Past Medical History:  Diagnosis Date   Anemia    hx of in 20's   Aortic atherosclerosis    Arthritis    Ascending aorta dilatation    42mm by CT 10/23   Asthma    Back pain    Breast mass, left 2003   Chronic back pain    seeing Pain specialist   Complication of anesthesia    shallow breathing with general   Constipation    DDD (degenerative disc disease)    Depression    Diabetes mellitus    borderline   DJD (degenerative joint disease)    Family history of heart attack    GERD (gastroesophageal reflux disease)    H/O scarlet fever    Hemorrhoids    History of chicken pox    History of measles, mumps, or rubella as child   all 3   Hx of bladder infections    Hypothyroidism    Irritable bowel syndrome    Joint pain    Lumbar stenosis    RFA done May 2015   Menopausal symptoms 2003   Obesity, morbid (HCC) 2006   Pericarditis    hx of   Pneumonia    hx of 5 years ago   Polyp of colon    removed during colonoscopy   SOBOE (shortness of breath on exertion)    Spinal headache    x1   Thyroid  condition    Trichomonas    Urge incontinence 2005   Vertigo    over a year was last episode   Yeast in stool     Past Surgical History:  Procedure Laterality Date   ABDOMINAL HYSTERECTOMY  02/18/1990   APPENDECTOMY  age 69   BLADDER SUSPENSION     BREAST LUMPECTOMY Left 11/20/2001   BREAST REDUCTION SURGERY  11/20/1990   CHOLECYSTECTOMY  11/20/2008   COLONOSCOPY  03/20/2014   4 polyps-benign   DILATION AND CURETTAGE OF UTERUS     FOOT SURGERY  Right    pinched nerve   HAND SURGERY Right    right-Dr. Camella   HERNIA REPAIR Right 11/20/1990   INTERSTIM IMPLANT PLACEMENT Right 11/20/2001   for incontinence   KNEE SURGERY     bilateral knee; x3 right/ x2 left   REDUCTION MAMMAPLASTY     REVERSE SHOULDER ARTHROPLASTY Left 04/19/2017   Procedure: REVERSE SHOULDER ARTHROPLASTY;  Surgeon: Dozier Soulier, MD;  Location: [facility] OR;  Service: Orthopedics;  Laterality: Left;  REVERSE SHOULDER ARTHROPLASTY   REVERSE SHOULDER ARTHROPLASTY Right 07/19/2023   Procedure: REVERSE SHOULDER ARTHROPLASTY;  Surgeon: Dozier Soulier, MD;  Location: [facility];  Service: Orthopedics;  Laterality: Right;   ROTATOR CUFF REPAIR  11/20/2006   right, almost complete reconstruction, bone anchors   TONSILLECTOMY  age 43   TOTAL HIP ARTHROPLASTY Right 07/30/2014   Procedure: RIGHT TOTAL HIP ARTHROPLASTY ANTERIOR APPROACH;  Surgeon: Lonni CINDERELLA Poli, MD;  Location: [facility] OR[facility];  Service: Orthopedics;  Laterality: Right;   TOTAL KNEE ARTHROPLASTY Right 04/07/2013   Procedure: RIGHT TOTAL KNEE ARTHROPLASTY;  Surgeon:  Dempsey LULLA Moan, MD;  Location: [facility];  Service: Orthopedics;  Laterality: Right;   TOTAL KNEE ARTHROPLASTY Left 09/22/2013   Procedure: LEFT TOTAL KNEE ARTHROPLASTY;  Surgeon: Dempsey LULLA Moan, MD;  Location: [facility];  Service: Orthopedics;  Laterality: Left;    Family History  Problem Relation Age of Onset   Cancer Mother    Hypertension Mother    Diabetes Mother    Lung cancer Mother    Heart disease Mother    Obesity Mother    Depression Father    Stroke Paternal Grandfather    Ovarian cancer Maternal Aunt    Breast cancer Maternal Aunt     Social History:  reports that she has never smoked. She has never used smokeless tobacco. She reports current alcohol use. She reports that she does not use drugs.  Allergies:  Allergies  Allergen Reactions   Oxycodone  Shortness Of Breath and Other (See Comments)    Turned purple and could not  breathe- with an injection; Can take vicodin/morphine     Penicillins Anaphylaxis, Hives and Other (See Comments)    Pt was a baby when this happened Has patient had a PCN reaction causing immediate rash, facial/tongue/throat swelling, SOB or lightheadedness with hypotension: Yes Has patient had a PCN reaction causing severe rash involving mucus membranes or skin necrosis: Unknown Has patient had a PCN reaction that required hospitalization: Unknown Has patient had a PCN reaction occurring within the last 10 years: No    Trazodone Other (See Comments)    Nightmares   Lyrica [Pregabalin] Other (See Comments)    Caused nightmares   Nabumetone Nausea And Vomiting   Sulfa Antibiotics Other (See Comments)    Caused fevers    Medications: I have reviewed the patient's current medications.  Results for orders placed or performed during the hospital encounter of 08/27/24 (from the past 48 hours)  I-Stat Chem 8, ED     Status: Abnormal   Collection Time: 08/27/24 11:00 PM  Result Value Ref Range   Sodium 141 135 - 145 mmol/L   Potassium 3.8 3.5 - 5.1 mmol/L   Chloride 104 98 - 111 mmol/L   BUN 16 8 - 23 mg/dL   Creatinine, Ser 9.29 0.44 - 1.00 mg/dL   Glucose, Bld 819 (H) 70 - 99 mg/dL    Comment: Glucose reference range applies only to samples taken after fasting for at least 8 hours.   Calcium , Ion 1.22 1.15 - 1.40 mmol/L   TCO2 24 22 - 32 mmol/L   Hemoglobin 13.6 12.0 - 15.0 g/dL   HCT 59.9 63.9 - 53.9 %  I-Stat Lactic Acid, ED     Status: Abnormal   Collection Time: 08/27/24 11:01 PM  Result Value Ref Range   Lactic Acid, Venous 3.0 (HH) 0.5 - 1.9 mmol/L   Comment NOTIFIED PHYSICIAN     DG Chest Port 1 View Result Date: 08/27/2024 CLINICAL DATA:  Preoperative evaluation EXAM: PORTABLE CHEST 1 VIEW COMPARISON:  06/18/2023 FINDINGS: Cardiac shadow is within normal limits. Tortuous thoracic aorta is noted. Lungs are clear bilaterally. Bilateral shoulder replacements are seen.  IMPRESSION: No acute abnormality noted. Electronically Signed   By: Oneil Devonshire M.D.   On: 08/27/2024 22:59   DG Knee 1-2 Views Right Result Date: 08/27/2024 CLINICAL DATA:  Recent fall with right knee pain, initial encounter EXAM: RIGHT KNEE - 2 VIEW COMPARISON:  None Available. FINDINGS: Right knee prosthesis is noted. Immediately superior to the prosthesis there is a transverse fracture  through the distal femoral metaphysis. The distal fracture fragment is posteriorly displaced with respect proximal femoral shaft. IMPRESSION: Transverse fracture just above the patient's right knee prosthesis with posterior displacement of the distal femoral fracture. Electronically Signed   By: Oneil Devonshire M.D.   On: 08/27/2024 22:59   DG Hip Unilat W or Wo Pelvis 2-3 Views Right Result Date: 08/27/2024 CLINICAL DATA:  Recent fall with right hip pain, initial encounter EXAM: DG HIP (WITH OR WITHOUT PELVIS) 3V RIGHT COMPARISON:  07/08/2019 FINDINGS: Pelvic ring is intact. Right hip replacement is noted. Right InterStim device is seen. Postsurgical changes in the lower lumbar spine are noted. Mild degenerative changes of the left hip joint are seen. No acute fracture is noted. IMPRESSION: Chronic changes without acute abnormality. Electronically Signed   By: Oneil Devonshire M.D.   On: 08/27/2024 22:53    Review of Systems Blood pressure (!) 108/96, pulse 81, temperature 98.4 F (36.9 C), temperature source Oral, resp. rate 17, height 5' 7 (1.702 m), weight 127 kg, SpO2 93%. Physical Exam Patient in moderate distress on the ED stretcher. She has multiple abrasions on Taylor face from Taylor fall. Neck and back non tender. Bilateral shoulders grossly located. Distally NVI all four extremities. Left LE with pain free AROM hip and knee and ankle.  Right leg with obvious deformity and instability above the knee. Healed midline incision. Compartments supple  Assessment/Plan: Right periprosthetic distal femur (TKA) fracture.   Plan for [facility] hospitalist admission and medical optimization. Patient will need ORIF or nailing of the distal femur fracture. NPO after MN Will discuss with Dr Hiram who performed Taylor TKA in the AM. Plan to follow Knee immobilizer applied. Bed rest  Kelly Taylor 08/27/2024, 11:37 PM

## 2024-08-27 NOTE — ED Notes (Addendum)
 Pt to xray

## 2024-08-27 NOTE — ED Provider Notes (Signed)
 Care assumed from Dr. Dreama, patient with periprosthetic right distal femur fracture. She is pending trauma scans, will need to be admitted. Orthopedics is going to see the patient.  CT scans of head, cervical spine, maxillofacial bones shows no acute traumatic injury.  CT of chest/abdomen/pelvis shows no acute traumatic injury and incidental finding of aneurysmal dilatation of ascending aorta at 4.1 cm.  I have independently viewed all of these images, and agree with radiologist's interpretation.  She will need annual imaging to monitor the ascending aortic aneurysm.  I have discussed the case with Dr. Alfornia of [facility], who agrees to admit the patient.  Because of slightly elevated lactic acid level she has requested additional IV fluids to be given and I have ordered that.  Results for orders placed or performed during the hospital encounter of 08/27/24  I-Stat Chem 8, ED   Collection Time: 08/27/24 11:00 PM  Result Value Ref Range   Sodium 141 135 - 145 mmol/L   Potassium 3.8 3.5 - 5.1 mmol/L   Chloride 104 98 - 111 mmol/L   BUN 16 8 - 23 mg/dL   Creatinine, Ser 9.29 0.44 - 1.00 mg/dL   Glucose, Bld 819 (H) 70 - 99 mg/dL   Calcium , Ion 1.22 1.15 - 1.40 mmol/L   TCO2 24 22 - 32 mmol/L   Hemoglobin 13.6 12.0 - 15.0 g/dL   HCT 59.9 63.9 - 53.9 %  I-Stat Lactic Acid, ED   Collection Time: 08/27/24 11:01 PM  Result Value Ref Range   Lactic Acid, Venous 3.0 (HH) 0.5 - 1.9 mmol/L   Comment NOTIFIED PHYSICIAN   Comprehensive metabolic panel   Collection Time: 08/27/24 11:51 PM  Result Value Ref Range   Sodium 141 135 - 145 mmol/L   Potassium 3.8 3.5 - 5.1 mmol/L   Chloride 104 98 - 111 mmol/L   CO2 24 22 - 32 mmol/L   Glucose, Bld 171 (H) 70 - 99 mg/dL   BUN 15 8 - 23 mg/dL   Creatinine, Ser 9.36 0.44 - 1.00 mg/dL   Calcium  9.6 8.9 - 10.3 mg/dL   Total Protein 6.6 6.5 - 8.1 g/dL   Albumin 3.9 3.5 - 5.0 g/dL   AST 18 15 - 41 U/L   ALT 16 0 - 44 U/L   Alkaline  Phosphatase 87 38 - 126 U/L   Total Bilirubin 0.3 0.0 - 1.2 mg/dL   GFR, Estimated >39 >39 mL/min   Anion gap 13 5 - 15  CBC   Collection Time: 08/27/24 11:51 PM  Result Value Ref Range   WBC 13.7 (H) 4.0 - 10.5 K/uL   RBC 4.54 3.87 - 5.11 MIL/uL   Hemoglobin 12.5 12.0 - 15.0 g/dL   HCT 58.2 63.9 - 53.9 %   MCV 91.9 80.0 - 100.0 fL   MCH 27.5 26.0 - 34.0 pg   MCHC 30.0 30.0 - 36.0 g/dL   RDW 86.2 88.4 - 84.4 %   Platelets 284 150 - 400 K/uL   nRBC 0.0 0.0 - 0.2 %  Ethanol   Collection Time: 08/27/24 11:51 PM  Result Value Ref Range   Alcohol, Ethyl (B) 31 (H) <15 mg/dL  Protime-INR   Collection Time: 08/27/24 11:51 PM  Result Value Ref Range   Prothrombin Time 12.9 11.4 - 15.2 seconds   INR 0.9 0.8 - 1.2  Urinalysis, Routine w reflex microscopic -Urine, Clean Catch   Collection Time: 08/28/24 12:50 AM  Result Value Ref Range   Color,  Urine YELLOW YELLOW   APPearance CLEAR CLEAR   Specific Gravity, Urine >1.046 (H) 1.005 - 1.030   pH 5.0 5.0 - 8.0   Glucose, UA NEGATIVE NEGATIVE mg/dL   Hgb urine dipstick NEGATIVE NEGATIVE   Bilirubin Urine NEGATIVE NEGATIVE   Ketones, ur 5 (A) NEGATIVE mg/dL   Protein, ur NEGATIVE NEGATIVE mg/dL   Nitrite POSITIVE (A) NEGATIVE   Leukocytes,Ua NEGATIVE NEGATIVE   RBC / HPF 0-5 0 - 5 RBC/hpf   WBC, UA 0-5 0 - 5 WBC/hpf   Bacteria, UA MANY (A) NONE SEEN   Squamous Epithelial / HPF 0-5 0 - 5 /HPF   CT CHEST ABDOMEN PELVIS W CONTRAST Result Date: 08/28/2024 CLINICAL DATA:  Polytrauma, blunt EXAM: CT CHEST, ABDOMEN, AND PELVIS WITH CONTRAST TECHNIQUE: Multidetector CT imaging of the chest, abdomen and pelvis was performed following the standard protocol during bolus administration of intravenous contrast. RADIATION DOSE REDUCTION: This exam was performed according to the departmental dose-optimization program which includes automated exposure control, adjustment of the mA and/or kV according to patient size and/or use of iterative  reconstruction technique. CONTRAST:  OMNIPAQUE  IOHEXOL  300 MG/ML  SOLN COMPARISON:  CT abdomen pelvis 07/10/2024, chest x-ray 06/18/2023 FINDINGS: CHEST: Cardiovascular: No aortic injury. Aneurysmal ascending thoracic aorta (4.1 cm). The thoracic aorta is normal in caliber. The heart is normal in size. No significant pericardial effusion. Mediastinum/Nodes: No pneumomediastinum. No mediastinal hematoma. The esophagus is unremarkable. The thyroid  is unremarkable. The central airways are patent. No mediastinal, hilar, or axillary lymphadenopathy. Lungs/Pleura: No focal consolidation. No pulmonary nodule. No pulmonary mass. No pulmonary contusion or laceration. No pneumatocele formation. No pleural effusion. No pneumothorax. No hemothorax. Musculoskeletal/Chest wall: No chest wall mass. No acute rib or sternal fracture. No spinal fracture. Total right shoulder arthroplasty. Multilevel degenerative changes of the spine. ABDOMEN / PELVIS: Hepatobiliary: Not enlarged. No focal lesion. No laceration or subcapsular hematoma. Status post cholecystectomy. No biliary ductal dilatation. Mitral annular calcification. Pancreas: Normal pancreatic contour. No main pancreatic duct dilatation. Spleen: Not enlarged. No focal lesion. No laceration, subcapsular hematoma, or vascular injury. Adrenals/Urinary Tract: No nodularity bilaterally. Bilateral kidneys enhance symmetrically. No hydronephrosis. No contusion, laceration, or subcapsular hematoma. Fluid is lesion of left kidney likely represents a simple renal cyst. Simple renal cysts, in the absence of clinically indicated signs/symptoms, require no independent follow-up. No injury to the vascular structures or collecting systems. No hydroureter. The urinary bladder is unremarkable. On delayed imaging, there is no urothelial wall thickening and there are no filling defects in the opacified portions of the bilateral collecting systems or ureters. Stomach/Bowel: No small or large  bowel wall thickening or dilatation. The appendix is not definitely identified with no inflammatory changes in the right lower quadrant to suggest acute appendicitis. Vasculature/Lymphatics: Mild atherosclerotic plaque. No abdominal aorta or iliac aneurysm. No active contrast extravasation or pseudoaneurysm. No abdominal, pelvic, inguinal lymphadenopathy. Reproductive: Normal. Other: No simple free fluid ascites. No pneumoperitoneum. No hemoperitoneum. No mesenteric hematoma identified. No organized fluid collection. Musculoskeletal: No significant soft tissue hematoma. Small fat containing left inguinal hernia. No acute pelvic fracture. Total right hip arthroplasty. No spinal fracture. Multilevel degenerative change of the spine. Appear L4 posterolateral and interbody surgical hardware fusion. Neural stimulator with the anterior to the sacrum. Other ports and devices: None. IMPRESSION: 1. No acute intrathoracic, intra-abdominal, intrapelvic traumatic injury. 2. No acute fracture or traumatic malalignment of the thoracic or lumbar spine. 3. Aneurysmal ascending thoracic aorta (4.1 cm). Recommend annual imaging followup by CTA or  MRA. This recommendation follows 2010 ACCF/AHA/AATS/ACR/ASA/SCA/SCAI/SIR/STS/SVM Guidelines for the Diagnosis and Management of Patients with Thoracic Aortic Disease. Circulation. 2010; 121: Z733-z630. Aortic aneurysm NOS (ICD10-I71.9). Electronically Signed   By: Morgane  Naveau M.D.   On: 08/28/2024 00:24   CT HEAD WO CONTRAST Result Date: 08/28/2024 CLINICAL DATA:  Head trauma, moderate-severe; Polytrauma, blunt; Facial trauma, blunt. Fall EXAM: CT HEAD WITHOUT CONTRAST CT MAXILLOFACIAL WITHOUT CONTRAST CT CERVICAL SPINE WITHOUT CONTRAST TECHNIQUE: Multidetector CT imaging of the head, cervical spine, and maxillofacial structures were performed using the standard protocol without intravenous contrast. Multiplanar CT image reconstructions of the cervical spine and maxillofacial  structures were also generated. RADIATION DOSE REDUCTION: This exam was performed according to the departmental dose-optimization program which includes automated exposure control, adjustment of the mA and/or kV according to patient size and/or use of iterative reconstruction technique. COMPARISON:  CT head 02/11/2014 FINDINGS: CT HEAD FINDINGS Brain: Trace patchy and confluent areas of decreased attenuation are noted throughout the deep and periventricular white matter of the cerebral hemispheres bilaterally, compatible with chronic microvascular ischemic disease. No evidence of large-territorial acute infarction. No parenchymal hemorrhage. No mass lesion. No extra-axial collection. No mass effect or midline shift. No hydrocephalus. Basilar cisterns are patent. Vascular: No hyperdense vessel. Skull: No acute fracture or focal lesion. Other: None. CT MAXILLOFACIAL FINDINGS Osseous: No fracture or mandibular dislocation. No destructive process. Sinuses/Orbits: Paranasal sinuses and mastoid air cells are clear. The orbits are unremarkable. Soft tissues: Negative. CT CERVICAL SPINE FINDINGS Alignment: Grade 1 anterolisthesis of C4 on C5. Grade 1 anterolisthesis of C6 on C7. Skull base and vertebrae: Multilevel severe degenerative changes of the spine. No severe osseous neural foraminal or central canal stenosis. No acute fracture. No aggressive appearing focal osseous lesion or focal pathologic process. Soft tissues and spinal canal: No prevertebral fluid or swelling. No visible canal hematoma. Upper chest: Unremarkable. Other: Aneurysmal ascending thoracic aorta measuring up to 4.1 cm in caliber. IMPRESSION: 1. No acute intracranial abnormality. 2.  No acute displaced facial fracture. 3. No acute displaced fracture or traumatic listhesis of the cervical spine. 4. Aneurysmal ascending thoracic aorta (4.1 cm). Recommend annual imaging followup by CTA or MRA. This recommendation follows 2010  ACCF/AHA/AATS/ACR/ASA/SCA/SCAI/SIR/STS/SVM Guidelines for the Diagnosis and Management of Patients with Thoracic Aortic Disease. Circulation. 2010; 121: [id number]. Aortic aneurysm NOS (ICD10-I71.9). Electronically Signed   By: Morgane  Naveau M.D.   On: 08/28/2024 00:16   CT MAXILLOFACIAL WO CONTRAST Result Date: 08/28/2024 CLINICAL DATA:  Head trauma, moderate-severe; Polytrauma, blunt; Facial trauma, blunt. Fall EXAM: CT HEAD WITHOUT CONTRAST CT MAXILLOFACIAL WITHOUT CONTRAST CT CERVICAL SPINE WITHOUT CONTRAST TECHNIQUE: Multidetector CT imaging of the head, cervical spine, and maxillofacial structures were performed using the standard protocol without intravenous contrast. Multiplanar CT image reconstructions of the cervical spine and maxillofacial structures were also generated. RADIATION DOSE REDUCTION: This exam was performed according to the departmental dose-optimization program which includes automated exposure control, adjustment of the mA and/or kV according to patient size and/or use of iterative reconstruction technique. COMPARISON:  CT head 02/11/2014 FINDINGS: CT HEAD FINDINGS Brain: Trace patchy and confluent areas of decreased attenuation are noted throughout the deep and periventricular white matter of the cerebral hemispheres bilaterally, compatible with chronic microvascular ischemic disease. No evidence of large-territorial acute infarction. No parenchymal hemorrhage. No mass lesion. No extra-axial collection. No mass effect or midline shift. No hydrocephalus. Basilar cisterns are patent. Vascular: No hyperdense vessel. Skull: No acute fracture or focal lesion. Other: None. CT MAXILLOFACIAL FINDINGS Osseous: No fracture  or mandibular dislocation. No destructive process. Sinuses/Orbits: Paranasal sinuses and mastoid air cells are clear. The orbits are unremarkable. Soft tissues: Negative. CT CERVICAL SPINE FINDINGS Alignment: Grade 1 anterolisthesis of C4 on C5. Grade 1 anterolisthesis of C6  on C7. Skull base and vertebrae: Multilevel severe degenerative changes of the spine. No severe osseous neural foraminal or central canal stenosis. No acute fracture. No aggressive appearing focal osseous lesion or focal pathologic process. Soft tissues and spinal canal: No prevertebral fluid or swelling. No visible canal hematoma. Upper chest: Unremarkable. Other: Aneurysmal ascending thoracic aorta measuring up to 4.1 cm in caliber. IMPRESSION: 1. No acute intracranial abnormality. 2.  No acute displaced facial fracture. 3. No acute displaced fracture or traumatic listhesis of the cervical spine. 4. Aneurysmal ascending thoracic aorta (4.1 cm). Recommend annual imaging followup by CTA or MRA. This recommendation follows 2010 ACCF/AHA/AATS/ACR/ASA/SCA/SCAI/SIR/STS/SVM Guidelines for the Diagnosis and Management of Patients with Thoracic Aortic Disease. Circulation. 2010; 121: [id number]. Aortic aneurysm NOS (ICD10-I71.9). Electronically Signed   By: Morgane  Naveau M.D.   On: 08/28/2024 00:16   CT CERVICAL SPINE WO CONTRAST Result Date: 08/28/2024 CLINICAL DATA:  Head trauma, moderate-severe; Polytrauma, blunt; Facial trauma, blunt. Fall EXAM: CT HEAD WITHOUT CONTRAST CT MAXILLOFACIAL WITHOUT CONTRAST CT CERVICAL SPINE WITHOUT CONTRAST TECHNIQUE: Multidetector CT imaging of the head, cervical spine, and maxillofacial structures were performed using the standard protocol without intravenous contrast. Multiplanar CT image reconstructions of the cervical spine and maxillofacial structures were also generated. RADIATION DOSE REDUCTION: This exam was performed according to the departmental dose-optimization program which includes automated exposure control, adjustment of the mA and/or kV according to patient size and/or use of iterative reconstruction technique. COMPARISON:  CT head 02/11/2014 FINDINGS: CT HEAD FINDINGS Brain: Trace patchy and confluent areas of decreased attenuation are noted throughout the deep and  periventricular white matter of the cerebral hemispheres bilaterally, compatible with chronic microvascular ischemic disease. No evidence of large-territorial acute infarction. No parenchymal hemorrhage. No mass lesion. No extra-axial collection. No mass effect or midline shift. No hydrocephalus. Basilar cisterns are patent. Vascular: No hyperdense vessel. Skull: No acute fracture or focal lesion. Other: None. CT MAXILLOFACIAL FINDINGS Osseous: No fracture or mandibular dislocation. No destructive process. Sinuses/Orbits: Paranasal sinuses and mastoid air cells are clear. The orbits are unremarkable. Soft tissues: Negative. CT CERVICAL SPINE FINDINGS Alignment: Grade 1 anterolisthesis of C4 on C5. Grade 1 anterolisthesis of C6 on C7. Skull base and vertebrae: Multilevel severe degenerative changes of the spine. No severe osseous neural foraminal or central canal stenosis. No acute fracture. No aggressive appearing focal osseous lesion or focal pathologic process. Soft tissues and spinal canal: No prevertebral fluid or swelling. No visible canal hematoma. Upper chest: Unremarkable. Other: Aneurysmal ascending thoracic aorta measuring up to 4.1 cm in caliber. IMPRESSION: 1. No acute intracranial abnormality. 2.  No acute displaced facial fracture. 3. No acute displaced fracture or traumatic listhesis of the cervical spine. 4. Aneurysmal ascending thoracic aorta (4.1 cm). Recommend annual imaging followup by CTA or MRA. This recommendation follows 2010 ACCF/AHA/AATS/ACR/ASA/SCA/SCAI/SIR/STS/SVM Guidelines for the Diagnosis and Management of Patients with Thoracic Aortic Disease. Circulation. 2010; 121: [id number]. Aortic aneurysm NOS (ICD10-I71.9). Electronically Signed   By: Morgane  Naveau M.D.   On: 08/28/2024 00:16   DG Chest Port 1 View Result Date: 08/27/2024 CLINICAL DATA:  Preoperative evaluation EXAM: PORTABLE CHEST 1 VIEW COMPARISON:  06/18/2023 FINDINGS: Cardiac shadow is within normal limits. Tortuous  thoracic aorta is noted. Lungs are clear bilaterally. Bilateral shoulder replacements are seen.  IMPRESSION: No acute abnormality noted. Electronically Signed   By: Oneil Devonshire M.D.   On: 08/27/2024 22:59   DG Knee 1-2 Views Right Result Date: 08/27/2024 CLINICAL DATA:  Recent fall with right knee pain, initial encounter EXAM: RIGHT KNEE - 2 VIEW COMPARISON:  None Available. FINDINGS: Right knee prosthesis is noted. Immediately superior to the prosthesis there is a transverse fracture through the distal femoral metaphysis. The distal fracture fragment is posteriorly displaced with respect proximal femoral shaft. IMPRESSION: Transverse fracture just above the patient's right knee prosthesis with posterior displacement of the distal femoral fracture. Electronically Signed   By: Oneil Devonshire M.D.   On: 08/27/2024 22:59   DG Hip Unilat W or Wo Pelvis 2-3 Views Right Result Date: 08/27/2024 CLINICAL DATA:  Recent fall with right hip pain, initial encounter EXAM: DG HIP (WITH OR WITHOUT PELVIS) 3V RIGHT COMPARISON:  07/08/2019 FINDINGS: Pelvic ring is intact. Right hip replacement is noted. Right InterStim device is seen. Postsurgical changes in the lower lumbar spine are noted. Mild degenerative changes of the left hip joint are seen. No acute fracture is noted. IMPRESSION: Chronic changes without acute abnormality. Electronically Signed   By: Oneil Devonshire M.D.   On: 08/27/2024 22:53      Raford Lenis, MD 08/28/24 [phone]

## 2024-08-27 NOTE — ED Triage Notes (Signed)
 Pt BIB ems after experiencing a fall on her driveway tonight, fell on the right side, hit her head and right knee.Has a swelling to the R knee and swelling and a laceration in the lip, nose bleed. Cannot bear weight on right leg. Had 2 glasses of wine. Not on blood thinners, given 2ml fentanyl  with ems that pt states didn't help at all. Hx. DM. VS stable. O2 drops down to the high 80s on 2L [place].

## 2024-08-28 ENCOUNTER — Inpatient Hospital Stay (HOSPITAL_COMMUNITY)

## 2024-08-28 ENCOUNTER — Encounter (HOSPITAL_COMMUNITY): Payer: Self-pay | Admitting: Internal Medicine

## 2024-08-28 ENCOUNTER — Encounter (HOSPITAL_COMMUNITY)
Admission: EM | Disposition: A | Discharge status: Skilled Nursing Facility | Payer: Self-pay | Source: Home / Self Care | Attending: Internal Medicine

## 2024-08-28 ENCOUNTER — Other Ambulatory Visit: Payer: Self-pay

## 2024-08-28 DIAGNOSIS — M978XXA Periprosthetic fracture around other internal prosthetic joint, initial encounter: Secondary | ICD-10-CM

## 2024-08-28 DIAGNOSIS — E1169 Type 2 diabetes mellitus with other specified complication: Secondary | ICD-10-CM | POA: Diagnosis present

## 2024-08-28 DIAGNOSIS — S72451A Displaced supracondylar fracture without intracondylar extension of lower end of right femur, initial encounter for closed fracture: Secondary | ICD-10-CM | POA: Diagnosis present

## 2024-08-28 DIAGNOSIS — E872 Acidosis, unspecified: Secondary | ICD-10-CM | POA: Diagnosis present

## 2024-08-28 DIAGNOSIS — M1811 Unilateral primary osteoarthritis of first carpometacarpal joint, right hand: Secondary | ICD-10-CM | POA: Diagnosis not present

## 2024-08-28 DIAGNOSIS — S72401A Unspecified fracture of lower end of right femur, initial encounter for closed fracture: Secondary | ICD-10-CM

## 2024-08-28 DIAGNOSIS — Z96612 Presence of left artificial shoulder joint: Secondary | ICD-10-CM | POA: Diagnosis present

## 2024-08-28 DIAGNOSIS — E66813 Obesity, class 3: Secondary | ICD-10-CM | POA: Diagnosis present

## 2024-08-28 DIAGNOSIS — I7121 Aneurysm of the ascending aorta, without rupture: Secondary | ICD-10-CM | POA: Diagnosis present

## 2024-08-28 DIAGNOSIS — E039 Hypothyroidism, unspecified: Secondary | ICD-10-CM | POA: Diagnosis present

## 2024-08-28 DIAGNOSIS — Z8249 Family history of ischemic heart disease and other diseases of the circulatory system: Secondary | ICD-10-CM | POA: Diagnosis not present

## 2024-08-28 DIAGNOSIS — B961 Klebsiella pneumoniae [K. pneumoniae] as the cause of diseases classified elsewhere: Secondary | ICD-10-CM | POA: Diagnosis present

## 2024-08-28 DIAGNOSIS — E119 Type 2 diabetes mellitus without complications: Secondary | ICD-10-CM

## 2024-08-28 DIAGNOSIS — Z7982 Long term (current) use of aspirin: Secondary | ICD-10-CM | POA: Diagnosis not present

## 2024-08-28 DIAGNOSIS — N390 Urinary tract infection, site not specified: Secondary | ICD-10-CM | POA: Diagnosis present

## 2024-08-28 DIAGNOSIS — Z96641 Presence of right artificial hip joint: Secondary | ICD-10-CM | POA: Diagnosis present

## 2024-08-28 DIAGNOSIS — Z96649 Presence of unspecified artificial hip joint: Secondary | ICD-10-CM | POA: Diagnosis present

## 2024-08-28 DIAGNOSIS — W010XXA Fall on same level from slipping, tripping and stumbling without subsequent striking against object, initial encounter: Secondary | ICD-10-CM | POA: Diagnosis present

## 2024-08-28 DIAGNOSIS — Z7984 Long term (current) use of oral hypoglycemic drugs: Secondary | ICD-10-CM | POA: Diagnosis not present

## 2024-08-28 DIAGNOSIS — Z6841 Body Mass Index (BMI) 40.0 and over, adult: Secondary | ICD-10-CM

## 2024-08-28 DIAGNOSIS — Y92009 Unspecified place in unspecified non-institutional (private) residence as the place of occurrence of the external cause: Secondary | ICD-10-CM | POA: Diagnosis not present

## 2024-08-28 DIAGNOSIS — Z79890 Hormone replacement therapy (postmenopausal): Secondary | ICD-10-CM | POA: Diagnosis not present

## 2024-08-28 DIAGNOSIS — Z880 Allergy status to penicillin: Secondary | ICD-10-CM | POA: Diagnosis not present

## 2024-08-28 DIAGNOSIS — S01511A Laceration without foreign body of lip, initial encounter: Secondary | ICD-10-CM | POA: Diagnosis present

## 2024-08-28 DIAGNOSIS — D72829 Elevated white blood cell count, unspecified: Secondary | ICD-10-CM

## 2024-08-28 DIAGNOSIS — G4733 Obstructive sleep apnea (adult) (pediatric): Secondary | ICD-10-CM | POA: Diagnosis present

## 2024-08-28 DIAGNOSIS — D62 Acute posthemorrhagic anemia: Secondary | ICD-10-CM | POA: Diagnosis not present

## 2024-08-28 DIAGNOSIS — J45909 Unspecified asthma, uncomplicated: Secondary | ICD-10-CM | POA: Diagnosis present

## 2024-08-28 DIAGNOSIS — M19031 Primary osteoarthritis, right wrist: Secondary | ICD-10-CM | POA: Diagnosis not present

## 2024-08-28 DIAGNOSIS — T84019A Broken internal joint prosthesis, unspecified site, initial encounter: Secondary | ICD-10-CM

## 2024-08-28 DIAGNOSIS — Z96652 Presence of left artificial knee joint: Secondary | ICD-10-CM | POA: Diagnosis present

## 2024-08-28 DIAGNOSIS — E1151 Type 2 diabetes mellitus with diabetic peripheral angiopathy without gangrene: Secondary | ICD-10-CM | POA: Diagnosis present

## 2024-08-28 DIAGNOSIS — M9711XA Periprosthetic fracture around internal prosthetic right knee joint, initial encounter: Secondary | ICD-10-CM | POA: Diagnosis present

## 2024-08-28 DIAGNOSIS — J9601 Acute respiratory failure with hypoxia: Secondary | ICD-10-CM | POA: Diagnosis not present

## 2024-08-28 HISTORY — PX: ORIF FEMUR FRACTURE: SHX2119

## 2024-08-28 LAB — URINALYSIS, ROUTINE W REFLEX MICROSCOPIC
Bilirubin Urine: NEGATIVE
Glucose, UA: NEGATIVE mg/dL
Hgb urine dipstick: NEGATIVE
Ketones, ur: 5 mg/dL — AB
Leukocytes,Ua: NEGATIVE
Nitrite: POSITIVE — AB
Protein, ur: NEGATIVE mg/dL
Specific Gravity, Urine: >1.046 — ABNORMAL HIGH (ref 1.005–1.030)
pH: 5 (ref 5.0–8.0)

## 2024-08-28 LAB — CBC
HCT: 40.6 % (ref 36.0–46.0)
Hemoglobin: 12.7 g/dL (ref 12.0–15.0)
MCH: 28.7 pg (ref 26.0–34.0)
MCHC: 31.3 g/dL (ref 30.0–36.0)
MCV: 91.6 fL (ref 80.0–100.0)
Platelets: 247 K/uL (ref 150–400)
RBC: 4.43 MIL/uL (ref 3.87–5.11)
RDW: 13.7 % (ref 11.5–15.5)
WBC: 14.7 K/uL — ABNORMAL HIGH (ref 4.0–10.5)
nRBC: 0 % (ref 0.0–0.2)

## 2024-08-28 LAB — COMPREHENSIVE METABOLIC PANEL WITH GFR
ALT: 16 U/L (ref 0–44)
AST: 18 U/L (ref 15–41)
Albumin: 3.9 g/dL (ref 3.5–5.0)
Alkaline Phosphatase: 87 U/L (ref 38–126)
Anion gap: 13 (ref 5–15)
BUN: 15 mg/dL (ref 8–23)
CO2: 24 mmol/L (ref 22–32)
Calcium: 9.6 mg/dL (ref 8.9–10.3)
Chloride: 104 mmol/L (ref 98–111)
Creatinine, Ser: 0.63 mg/dL (ref 0.44–1.00)
GFR, Estimated: >60 mL/min (ref >60)
Glucose, Bld: 171 mg/dL — ABNORMAL HIGH (ref 70–99)
Potassium: 3.8 mmol/L (ref 3.5–5.1)
Sodium: 141 mmol/L (ref 135–145)
Total Bilirubin: 0.3 mg/dL (ref 0.0–1.2)
Total Protein: 6.6 g/dL (ref 6.5–8.1)

## 2024-08-28 LAB — SURGICAL PCR SCREEN
MRSA, PCR: POSITIVE — AB
Staphylococcus aureus: POSITIVE — AB

## 2024-08-28 LAB — GLUCOSE, CAPILLARY
Glucose-Capillary: 133 mg/dL — ABNORMAL HIGH (ref 70–99)
Glucose-Capillary: 138 mg/dL — ABNORMAL HIGH (ref 70–99)

## 2024-08-28 LAB — PROTIME-INR
INR: 0.9 (ref 0.8–1.2)
Prothrombin Time: 12.9 s (ref 11.4–15.2)

## 2024-08-28 LAB — SAMPLE TO BLOOD BANK

## 2024-08-28 LAB — PROCALCITONIN: Procalcitonin: <0.1 ng/mL

## 2024-08-28 LAB — LACTIC ACID, PLASMA: Lactic Acid, Venous: 3.3 mmol/L (ref 0.5–1.9)

## 2024-08-28 LAB — HEMOGLOBIN A1C
Hgb A1c MFr Bld: 6.8 % — ABNORMAL HIGH (ref 4.8–5.6)
Mean Plasma Glucose: 148.46 mg/dL

## 2024-08-28 LAB — ETHANOL: Alcohol, Ethyl (B): 31 mg/dL — ABNORMAL HIGH (ref <15)

## 2024-08-28 LAB — CBG MONITORING, ED: Glucose-Capillary: 153 mg/dL — ABNORMAL HIGH (ref 70–99)

## 2024-08-28 SURGERY — OPEN REDUCTION INTERNAL FIXATION (ORIF) DISTAL FEMUR FRACTURE
Anesthesia: General | Laterality: Right

## 2024-08-28 MED ORDER — ROCURONIUM BROMIDE 10 MG/ML (PF) SYRINGE
PREFILLED_SYRINGE | INTRAVENOUS | Status: DC | PRN
  Administered 2024-08-28: 70 mg via INTRAVENOUS

## 2024-08-28 MED ORDER — FENTANYL CITRATE (PF) 100 MCG/2ML IJ SOLN
INTRAMUSCULAR | Status: AC
  Administered 2024-08-28: 50 ug
  Filled 2024-08-28: qty 2

## 2024-08-28 MED ORDER — VANCOMYCIN HCL 1000 MG IV SOLR
INTRAVENOUS | Status: AC
  Filled 2024-08-28: qty 20

## 2024-08-28 MED ORDER — HYDROMORPHONE HCL 1 MG/ML IJ SOLN
0.5000 mg | INTRAMUSCULAR | Status: DC | PRN
  Administered 2024-08-28: 0.5 mg via INTRAVENOUS
  Filled 2024-08-28: qty 1

## 2024-08-28 MED ORDER — ACETAMINOPHEN 325 MG PO TABS: 650.0000 mg | ORAL_TABLET | Freq: Four times a day (QID) | ORAL | Status: DC | PRN

## 2024-08-28 MED ORDER — STERILE WATER FOR IRRIGATION IR SOLN
Status: DC | PRN
Start: 2024-08-28 — End: 2024-08-28
  Administered 2024-08-28: 1000 mL

## 2024-08-28 MED ORDER — FENTANYL CITRATE (PF) 250 MCG/5ML IJ SOLN
INTRAMUSCULAR | Status: AC
  Filled 2024-08-28: qty 5

## 2024-08-28 MED ORDER — PHENYLEPHRINE 80 MCG/ML (10ML) SYRINGE FOR IV PUSH (FOR BLOOD PRESSURE SUPPORT)
PREFILLED_SYRINGE | INTRAVENOUS | Status: AC
  Filled 2024-08-28: qty 10

## 2024-08-28 MED ORDER — NALOXONE HCL 0.4 MG/ML IJ SOLN: 0.4000 mg | INTRAMUSCULAR | Status: DC | PRN

## 2024-08-28 MED ORDER — LACTATED RINGERS IV SOLN: INTRAVENOUS | Status: DC

## 2024-08-28 MED ORDER — LIDOCAINE 2% (20 MG/ML) 5 ML SYRINGE
INTRAMUSCULAR | Status: DC | PRN
  Administered 2024-08-28: 100 mg via INTRAVENOUS

## 2024-08-28 MED ORDER — INSULIN ASPART 100 UNIT/ML IJ SOLN
0.0000 [IU] | INTRAMUSCULAR | Status: DC
  Administered 2024-08-28 – 2024-08-29 (×4): 2 [IU] via SUBCUTANEOUS
  Administered 2024-08-29 – 2024-08-30 (×3): 1 [IU] via SUBCUTANEOUS
  Administered 2024-08-30 (×2): 2 [IU] via SUBCUTANEOUS
  Administered 2024-08-30: 1 [IU] via SUBCUTANEOUS
  Filled 2024-08-28: qty 0.09

## 2024-08-28 MED ORDER — PROPOFOL 10 MG/ML IV BOLUS
INTRAVENOUS | Status: DC | PRN
  Administered 2024-08-28: 150 mg via INTRAVENOUS

## 2024-08-28 MED ORDER — LEVOTHYROXINE SODIUM 75 MCG PO TABS
75.0000 ug | ORAL_TABLET | Freq: Every day | ORAL | Status: DC
Start: 2024-08-28 — End: 2024-09-02
  Administered 2024-08-28 – 2024-09-02 (×6): 75 ug via ORAL
  Filled 2024-08-28 (×6): qty 1

## 2024-08-28 MED ORDER — FESOTERODINE FUMARATE ER 4 MG PO TB24
4.0000 mg | ORAL_TABLET | Freq: Every day | ORAL | Status: DC
  Administered 2024-08-28 – 2024-09-02 (×6): 4 mg via ORAL
  Filled 2024-08-28 (×6): qty 1

## 2024-08-28 MED ORDER — OXYCODONE HCL 5 MG PO TABS: 5.0000 mg | ORAL_TABLET | Freq: Once | ORAL | Status: DC | PRN

## 2024-08-28 MED ORDER — LIDOCAINE 2% (20 MG/ML) 5 ML SYRINGE
INTRAMUSCULAR | Status: AC
  Filled 2024-08-28: qty 5

## 2024-08-28 MED ORDER — OXYCODONE HCL 5 MG/5ML PO SOLN: 5.0000 mg | Freq: Once | ORAL | Status: DC | PRN

## 2024-08-28 MED ORDER — VANCOMYCIN HCL 1000 MG IV SOLR
INTRAVENOUS | Status: DC | PRN
  Administered 2024-08-28: 1000 mg via TOPICAL

## 2024-08-28 MED ORDER — PHENYLEPHRINE HCL-NACL 20-0.9 MG/250ML-% IV SOLN
INTRAVENOUS | Status: DC | PRN
Start: 2024-08-28 — End: 2024-08-28
  Administered 2024-08-28: 50 ug/min via INTRAVENOUS

## 2024-08-28 MED ORDER — FENTANYL CITRATE (PF) 100 MCG/2ML IJ SOLN
25.0000 ug | INTRAMUSCULAR | Status: DC | PRN
  Administered 2024-08-28: 50 ug via INTRAVENOUS
  Administered 2024-08-28: 25 ug via INTRAVENOUS

## 2024-08-28 MED ORDER — HYDROMORPHONE HCL 1 MG/ML IJ SOLN
1.0000 mg | INTRAMUSCULAR | Status: DC | PRN
  Administered 2024-08-28 (×2): 1 mg via INTRAVENOUS
  Filled 2024-08-28 (×2): qty 1

## 2024-08-28 MED ORDER — FENTANYL CITRATE (PF) 100 MCG/2ML IJ SOLN
50.0000 ug | Freq: Once | INTRAMUSCULAR | Status: DC
Start: 2024-08-28 — End: 2024-08-29
  Filled 2024-08-28: qty 1

## 2024-08-28 MED ORDER — SIMVASTATIN 20 MG PO TABS
10.0000 mg | ORAL_TABLET | Freq: Every day | ORAL | Status: DC
  Administered 2024-08-28 – 2024-09-01 (×5): 10 mg via ORAL
  Filled 2024-08-28 (×5): qty 1

## 2024-08-28 MED ORDER — SUGAMMADEX SODIUM 200 MG/2ML IV SOLN
INTRAVENOUS | Status: DC | PRN
  Administered 2024-08-28: 100 mg via INTRAVENOUS
  Administered 2024-08-28: 200 mg via INTRAVENOUS

## 2024-08-28 MED ORDER — ACETAMINOPHEN 650 MG RE SUPP: 650.0000 mg | Freq: Four times a day (QID) | RECTAL | Status: DC | PRN

## 2024-08-28 MED ORDER — SENNOSIDES-DOCUSATE SODIUM 8.6-50 MG PO TABS: 1.0000 | ORAL_TABLET | Freq: Every evening | ORAL | Status: DC | PRN

## 2024-08-28 MED ORDER — VANCOMYCIN HCL 1000 MG IV SOLR
INTRAVENOUS | Status: DC | PRN
  Administered 2024-08-28: 1000 mg via INTRAVENOUS

## 2024-08-28 MED ORDER — ORAL CARE MOUTH RINSE: 15.0000 mL | Freq: Once | OROMUCOSAL | Status: AC

## 2024-08-28 MED ORDER — LACTATED RINGERS IV BOLUS
1000.0000 mL | Freq: Once | INTRAVENOUS | Status: AC
  Administered 2024-08-28: 1000 mL via INTRAVENOUS

## 2024-08-28 MED ORDER — ONDANSETRON HCL 4 MG/2ML IJ SOLN
INTRAMUSCULAR | Status: DC | PRN
  Administered 2024-08-28 (×2): 4 mg via INTRAVENOUS

## 2024-08-28 MED ORDER — PHENYLEPHRINE 80 MCG/ML (10ML) SYRINGE FOR IV PUSH (FOR BLOOD PRESSURE SUPPORT)
PREFILLED_SYRINGE | INTRAVENOUS | Status: DC | PRN
  Administered 2024-08-28: 80 ug via INTRAVENOUS
  Administered 2024-08-28 (×2): 160 ug via INTRAVENOUS
  Administered 2024-08-28: 80 ug via INTRAVENOUS

## 2024-08-28 MED ORDER — POLYETHYLENE GLYCOL 3350 17 G PO PACK: 17.0000 g | PACK | Freq: Every day | ORAL | Status: DC

## 2024-08-28 MED ORDER — CHLORHEXIDINE GLUCONATE 0.12 % MT SOLN
15.0000 mL | Freq: Once | OROMUCOSAL | Status: AC
  Administered 2024-08-28: 15 mL via OROMUCOSAL

## 2024-08-28 MED ORDER — PROPOFOL 10 MG/ML IV BOLUS
INTRAVENOUS | Status: AC
  Filled 2024-08-28: qty 20

## 2024-08-28 MED ORDER — DROPERIDOL 2.5 MG/ML IJ SOLN
0.6250 mg | Freq: Once | INTRAMUSCULAR | Status: AC | PRN
  Administered 2024-08-28: 0.625 mg via INTRAVENOUS

## 2024-08-28 MED ORDER — ACETAMINOPHEN 10 MG/ML IV SOLN
INTRAVENOUS | Status: AC
  Filled 2024-08-28: qty 100

## 2024-08-28 MED ORDER — DROPERIDOL 2.5 MG/ML IJ SOLN
INTRAMUSCULAR | Status: AC
  Filled 2024-08-28: qty 2

## 2024-08-28 MED ORDER — ROCURONIUM BROMIDE 10 MG/ML (PF) SYRINGE
PREFILLED_SYRINGE | INTRAVENOUS | Status: AC
  Filled 2024-08-28: qty 10

## 2024-08-28 MED ORDER — HYDROMORPHONE HCL 1 MG/ML IJ SOLN
1.0000 mg | Freq: Once | INTRAMUSCULAR | Status: AC
  Administered 2024-08-28: 1 mg via INTRAVENOUS
  Filled 2024-08-28: qty 1

## 2024-08-28 MED ORDER — ACETAMINOPHEN 10 MG/ML IV SOLN
1000.0000 mg | Freq: Once | INTRAVENOUS | Status: DC | PRN
  Administered 2024-08-28: 1000 mg via INTRAVENOUS

## 2024-08-28 MED ORDER — 0.9 % SODIUM CHLORIDE (POUR BTL) OPTIME
TOPICAL | Status: DC | PRN
  Administered 2024-08-28: 1000 mL

## 2024-08-28 MED ORDER — INSULIN ASPART 100 UNIT/ML IJ SOLN: 0.0000 [IU] | INTRAMUSCULAR | Status: DC | PRN

## 2024-08-28 MED ORDER — FENTANYL CITRATE (PF) 100 MCG/2ML IJ SOLN
INTRAMUSCULAR | Status: AC
  Filled 2024-08-28: qty 2

## 2024-08-28 MED ORDER — PROCHLORPERAZINE EDISYLATE 10 MG/2ML IJ SOLN
10.0000 mg | Freq: Once | INTRAMUSCULAR | Status: AC
  Administered 2024-08-28: 10 mg via INTRAVENOUS
  Filled 2024-08-28: qty 2

## 2024-08-28 MED ORDER — ONDANSETRON HCL 4 MG/2ML IJ SOLN
INTRAMUSCULAR | Status: AC
Start: 2024-08-28 — End: 2024-08-28
  Filled 2024-08-28: qty 2

## 2024-08-28 MED ORDER — SODIUM CHLORIDE 0.9 % IV SOLN: INTRAVENOUS | Status: AC

## 2024-08-28 MED ORDER — FENTANYL CITRATE (PF) 250 MCG/5ML IJ SOLN
INTRAMUSCULAR | Status: DC | PRN
  Administered 2024-08-28: 100 ug via INTRAVENOUS
  Administered 2024-08-28: 50 ug via INTRAVENOUS

## 2024-08-28 MED ORDER — POLYETHYLENE GLYCOL 3350 17 G PO PACK
17.0000 g | PACK | Freq: Two times a day (BID) | ORAL | Status: AC
  Administered 2024-08-28 – 2024-08-29 (×3): 17 g via ORAL
  Filled 2024-08-28 (×4): qty 1

## 2024-08-28 SURGICAL SUPPLY — 59 items
BAG COUNTER SPONGE SURGICOUNT (BAG) ×1 IMPLANT
BIT DRILL 4.3 (BIT) IMPLANT
BIT DRILL 4.3X300MM (BIT) IMPLANT
BIT DRILL LONG 3.3 (BIT) IMPLANT
BIT DRILL QC 3.3X195 (BIT) IMPLANT
BLADE CLIPPER SURG (BLADE) IMPLANT
BNDG COHESIVE 6X5 TAN ST LF (GAUZE/BANDAGES/DRESSINGS) ×1 IMPLANT
BNDG ELASTIC 6X10 VLCR STRL LF (GAUZE/BANDAGES/DRESSINGS) ×1 IMPLANT
BRUSH SCRUB EZ PLAIN DRY (MISCELLANEOUS) ×2 IMPLANT
CANISTER SUCTION 3000ML PPV (SUCTIONS) ×1 IMPLANT
CAP LOCK NCB (Cap) IMPLANT
CHLORAPREP W/TINT 26 (MISCELLANEOUS) ×1 IMPLANT
COVER SURGICAL LIGHT HANDLE (MISCELLANEOUS) ×1 IMPLANT
DRAPE C-ARM 42X72 X-RAY (DRAPES) ×1 IMPLANT
DRAPE C-ARMOR (DRAPES) ×1 IMPLANT
DRAPE HALF SHEET 40X57 (DRAPES) ×2 IMPLANT
DRAPE SURG 17X23 STRL (DRAPES) ×1 IMPLANT
DRAPE SURG ORHT 6 SPLT 77X108 (DRAPES) ×2 IMPLANT
DRAPE U-SHAPE 47X51 STRL (DRAPES) ×1 IMPLANT
DRESSING MEPILEX FLEX 4X4 (GAUZE/BANDAGES/DRESSINGS) IMPLANT
DRSG ADAPTIC 3X8 NADH LF (GAUZE/BANDAGES/DRESSINGS) IMPLANT
DRSG MEPILEX POST OP 4X12 (GAUZE/BANDAGES/DRESSINGS) IMPLANT
DRSG MEPILEX POST OP 4X8 (GAUZE/BANDAGES/DRESSINGS) IMPLANT
ELECTRODE REM PT RTRN 9FT ADLT (ELECTROSURGICAL) ×1 IMPLANT
GAUZE PAD ABD 8X10 STRL (GAUZE/BANDAGES/DRESSINGS) ×3 IMPLANT
GAUZE SPONGE 4X4 12PLY STRL (GAUZE/BANDAGES/DRESSINGS) ×1 IMPLANT
GLOVE BIO SURGEON STRL SZ 6.5 (GLOVE) ×3 IMPLANT
GLOVE BIO SURGEON STRL SZ7.5 (GLOVE) ×4 IMPLANT
GLOVE BIOGEL PI IND STRL 6.5 (GLOVE) ×1 IMPLANT
GLOVE BIOGEL PI IND STRL 7.5 (GLOVE) ×1 IMPLANT
GOWN STRL REUS W/ TWL LRG LVL3 (GOWN DISPOSABLE) ×3 IMPLANT
KIT BASIN OR (CUSTOM PROCEDURE TRAY) ×1 IMPLANT
KIT TURNOVER KIT B (KITS) ×1 IMPLANT
KWIRE FXSTD 280X2XNS SS (WIRE) IMPLANT
PACK TOTAL JOINT (CUSTOM PROCEDURE TRAY) ×1 IMPLANT
PAD ARMBOARD POSITIONER FOAM (MISCELLANEOUS) ×1 IMPLANT
PAD CAST 4YDX4 CTTN HI CHSV (CAST SUPPLIES) ×1 IMPLANT
PADDING CAST COTTON 6X4 STRL (CAST SUPPLIES) ×1 IMPLANT
PLATE DISTAL FEMUR 15H 317M RT (Plate) IMPLANT
SCREW 5.0 70MM (Screw) IMPLANT
SCREW CORTICAL NCB 5.0X40 (Screw) IMPLANT
SCREW NCB 3.5X75X5X6.2XST (Screw) IMPLANT
SCREW NCB 4.0MX38M (Screw) IMPLANT
SCREW NCB 5.0X38 (Screw) IMPLANT
SCREW UNI 5.0 12MM (Screw) IMPLANT
SCREW UNICORTICAL 5.0X14 (Screw) IMPLANT
SOLN 0.9% NACL 1000 ML (IV SOLUTION) ×1 IMPLANT
SOLN 0.9% NACL POUR BTL 1000ML (IV SOLUTION) ×1 IMPLANT
SOLN STERILE WATER 1000 ML (IV SOLUTION) ×2 IMPLANT
SOLN STERILE WATER BTL 1000 ML (IV SOLUTION) ×2 IMPLANT
SPONGE T-LAP 18X18 ~~LOC~~+RFID (SPONGE) IMPLANT
STAPLER SKIN PROX 35W (STAPLE) ×1 IMPLANT
SUCTION TUBE FRAZIER 10FR DISP (SUCTIONS) ×1 IMPLANT
SUT ETHILON 3 0 PS 1 (SUTURE) ×2 IMPLANT
SUT VIC AB 0 CT1 27XBRD ANBCTR (SUTURE) IMPLANT
SUT VIC AB 1 CT1 27XBRD ANBCTR (SUTURE) IMPLANT
SUT VIC AB 2-0 CT1 TAPERPNT 27 (SUTURE) ×2 IMPLANT
TOWEL GREEN STERILE (TOWEL DISPOSABLE) ×2 IMPLANT
TRAY FOLEY MTR SLVR 16FR STAT (SET/KITS/TRAYS/PACK) IMPLANT

## 2024-08-28 NOTE — Consult Note (Signed)
 Orthopaedic Trauma Service (OTS) Consult   Patient ID: Kelly Taylor MRN: [id number] DOB/AGE: 1951/01/17 73 y.o.  Reason for Consult:Right supracondylar distal femur fracture Referring Physician: Dr. Marcey Her, MD [place]  HPI: Kelly Taylor is an 73 y.o. female who is being seen in consultation at the request of Dr. Mariea for evaluation of right periprosthetic distal femur fracture.  Patient sustained a fall and fractured the above.  She had a total knee replacement with Dr. Hiram in 2014.  Due to complexity and OR availability I was asked to assist in manage primarily for definitive fixation.  Patient was seen and evaluated in the preoperative holding area.  Patient otherwise ambulates without assist device.  She is type II diabetic but does not take insulin  and only takes metformin .  Her last hemoglobin A1c was 6.3.  She lives at home with her brother.  No other injuries.  She does note some hand pain but x-rays were performed and are negative.  Past Medical History:  Diagnosis Date   Anemia    hx of in 20's   Aortic atherosclerosis    Arthritis    Ascending aorta dilatation    42mm by CT 10/23   Asthma    Back pain    Breast mass, left 2003   Chronic back pain    seeing Pain specialist   Complication of anesthesia    shallow breathing with general   Constipation    DDD (degenerative disc disease)    Depression    Diabetes mellitus    borderline   DJD (degenerative joint disease)    Family history of heart attack    GERD (gastroesophageal reflux disease)    H/O scarlet fever    Hemorrhoids    History of chicken pox    History of measles, mumps, or rubella as child   all 3   Hx of bladder infections    Hypothyroidism    Irritable bowel syndrome    Joint pain    Lumbar stenosis    RFA done May 2015   Menopausal symptoms 2003   Obesity, morbid (HCC) 2006   Pericarditis    hx of   Pneumonia    hx of 5 years ago   Polyp of colon    removed during  colonoscopy   SOBOE (shortness of breath on exertion)    Spinal headache    x1   Thyroid  condition    Trichomonas    Urge incontinence 2005   Vertigo    over a year was last episode   Yeast in stool     Past Surgical History:  Procedure Laterality Date   ABDOMINAL HYSTERECTOMY  02/18/1990   APPENDECTOMY  age 68   BLADDER SUSPENSION     BREAST LUMPECTOMY Left 11/20/2001   BREAST REDUCTION SURGERY  11/20/1990   CHOLECYSTECTOMY  11/20/2008   COLONOSCOPY  03/20/2014   4 polyps-benign   DILATION AND CURETTAGE OF UTERUS     FOOT SURGERY Right    pinched nerve   HAND SURGERY Right    right-Dr. Camella   HERNIA REPAIR Right 11/20/1990   INTERSTIM IMPLANT PLACEMENT Right 11/20/2001   for incontinence   KNEE SURGERY     bilateral knee; x3 right/ x2 left   REDUCTION MAMMAPLASTY     REVERSE SHOULDER ARTHROPLASTY Left 04/19/2017   Procedure: REVERSE SHOULDER ARTHROPLASTY;  Surgeon: Dozier Soulier, MD;  Location: [facility] OR;  Service: Orthopedics;  Laterality: Left;  REVERSE SHOULDER ARTHROPLASTY   REVERSE  SHOULDER ARTHROPLASTY Right 07/19/2023   Procedure: REVERSE SHOULDER ARTHROPLASTY;  Surgeon: Dozier Soulier, MD;  Location: [facility];  Service: Orthopedics;  Laterality: Right;   ROTATOR CUFF REPAIR  11/20/2006   right, almost complete reconstruction, bone anchors   TONSILLECTOMY  age 89   TOTAL HIP ARTHROPLASTY Right 07/30/2014   Procedure: RIGHT TOTAL HIP ARTHROPLASTY ANTERIOR APPROACH;  Surgeon: Lonni CINDERELLA Poli, MD;  Location: [facility];  Service: Orthopedics;  Laterality: Right;   TOTAL KNEE ARTHROPLASTY Right 04/07/2013   Procedure: RIGHT TOTAL KNEE ARTHROPLASTY;  Surgeon: Dempsey LULLA Moan, MD;  Location: [facility];  Service: Orthopedics;  Laterality: Right;   TOTAL KNEE ARTHROPLASTY Left 09/22/2013   Procedure: LEFT TOTAL KNEE ARTHROPLASTY;  Surgeon: Dempsey LULLA Moan, MD;  Location: [facility];  Service: Orthopedics;  Laterality: Left;    Family History  Problem Relation Age of  Onset   Cancer Mother    Hypertension Mother    Diabetes Mother    Lung cancer Mother    Heart disease Mother    Obesity Mother    Depression Father    Stroke Paternal Grandfather    Ovarian cancer Maternal Aunt    Breast cancer Maternal Aunt     Social History:  reports that she has never smoked. She has never used smokeless tobacco. She reports current alcohol use. She reports that she does not use drugs.  Allergies:  Allergies  Allergen Reactions   Oxycodone  Shortness Of Breath and Other (See Comments)    Turned purple and could not breathe- with an injection; Can take vicodin/morphine     Penicillins Anaphylaxis, Hives and Other (See Comments)    Pt was a baby when this happened Has patient had a PCN reaction causing immediate rash, facial/tongue/throat swelling, SOB or lightheadedness with hypotension: Yes Has patient had a PCN reaction causing severe rash involving mucus membranes or skin necrosis: Unknown Has patient had a PCN reaction that required hospitalization: Unknown Has patient had a PCN reaction occurring within the last 10 years: No    Trazodone Other (See Comments)    Nightmares   Lyrica [Pregabalin] Other (See Comments)    Caused nightmares   Nabumetone Nausea And Vomiting   Sulfa Antibiotics Other (See Comments)    Caused fevers    Medications:  No current facility-administered medications on file prior to encounter.   Current Outpatient Medications on File Prior to Encounter  Medication Sig Dispense Refill   albuterol  (PROVENTIL  HFA;VENTOLIN  HFA) 108 (90 BASE) MCG/ACT inhaler Inhale 2 puffs into the lungs every 4 (four) hours as needed for wheezing or shortness of breath. 1 Inhaler 0   aspirin  EC 81 MG tablet Take 81 mg by mouth daily. Swallow whole.     diclofenac sodium (VOLTAREN) 1 % GEL Apply 2 g topically 4 (four) times daily as needed (for pain).     docusate sodium  (COLACE) 100 MG capsule Take 200 mg by mouth every evening.     gabapentin  (NEURONTIN) 100 MG capsule Take 100 mg by mouth daily as needed.     hydrocortisone  (ANUSOL -HC) 2.5 % rectal cream Place 1 Application rectally 2 (two) times daily. (Patient taking differently: Place 1 Application rectally 2 (two) times daily as needed.) 30 g 0   ipratropium (ATROVENT) 0.06 % nasal spray Place 2 sprays into both nostrils 4 (four) times daily as needed (for seasonal allergies).     Lactulose  20 GM/30ML SOLN Take 15 mLs (10 g total) by mouth daily as needed (constipation). 450 mL 0  magnesium  oxide (MAG-OX) 400 (240 Mg) MG tablet Take 400 mg by mouth every evening.     metFORMIN  (GLUCOPHAGE -XR) 500 MG 24 hr tablet Take 500 mg by mouth in the morning and at bedtime.     Misc Natural Products (FLEX-A-MIN JOINT FLEX PO) Take 1 tablet by mouth in the morning. Instaflex Advanced Joint Support     Multiple Vitamins-Minerals (WOMENS MULTIVITAMIN) TABS Take 1 tablet by mouth daily with breakfast.     Omega-3 Fatty Acids (FISH OIL PO) Take 2 capsules by mouth daily after breakfast.     omeprazole  (PRILOSEC) 20 MG capsule Take 20 mg by mouth daily as needed.  1   polyethylene glycol (MIRALAX  / GLYCOLAX ) packet Take 17 g by mouth daily.     Probiotic Product (PROBIOTIC PO) Take 1 capsule by mouth every evening.     simvastatin  (ZOCOR ) 10 MG tablet Take 10 mg by mouth at bedtime.     solifenacin (VESICARE) 10 MG tablet Take 10 mg by mouth in the morning.     SYNTHROID  75 MCG tablet Take 75 mcg by mouth daily before breakfast.     traMADol  (ULTRAM ) 50 MG tablet Take 50 mg by mouth 3 (three) times daily as needed (for pain).       ROS: Constitutional: No fever or chills Vision: No changes in vision ENT: No difficulty swallowing CV: No chest pain Pulm: No SOB or wheezing GI: No nausea or vomiting GU: No urgency or inability to hold urine Skin: No poor wound healing Neurologic: No numbness or tingling Psychiatric: No depression or anxiety Heme: No bruising Allergic: No reaction to  medications or food   Exam: Blood pressure 115/76, pulse 100, temperature 98.6 F (37 C), temperature source Oral, resp. rate 19, height 5' 7 (1.702 m), weight 127 kg, SpO2 91%. General: No acute distress Orientation: Awake alert and oriented x 3 Mood and Affect: Cooperative and pleasant Gait: Unable to assess due to her fracture Coordination and balance: Within normal limits  Right lower extremity: Knee immobilizer is placed.  Is clean dry and intact.  Some swelling to the leg and deformity through the distal femur.  Compartments are soft compressible.  She has intact motor and sensory function distally.  She has 2+ DP and PT pulses.  Left lower extremity: Skin without lesions. No tenderness to palpation. Full painless ROM, full strength in each muscle groups without evidence of instability.   Medical Decision Making: Data: Imaging: X-rays were reviewed which shows a periprosthetic supracondylar distal femur fracture.  The total knee arthroplasty appears to be in good position  Labs:  Results for orders placed or performed during the hospital encounter of 08/27/24 (from the past 24 hours)  I-Stat Chem 8, ED     Status: Abnormal   Collection Time: 08/27/24 11:00 PM  Result Value Ref Range   Sodium 141 135 - 145 mmol/L   Potassium 3.8 3.5 - 5.1 mmol/L   Chloride 104 98 - 111 mmol/L   BUN 16 8 - 23 mg/dL   Creatinine, Ser 9.29 0.44 - 1.00 mg/dL   Glucose, Bld 819 (H) 70 - 99 mg/dL   Calcium , Ion 1.22 1.15 - 1.40 mmol/L   TCO2 24 22 - 32 mmol/L   Hemoglobin 13.6 12.0 - 15.0 g/dL   HCT 59.9 63.9 - 53.9 %  I-Stat Lactic Acid, ED     Status: Abnormal   Collection Time: 08/27/24 11:01 PM  Result Value Ref Range   Lactic Acid, Venous 3.0 (  HH) 0.5 - 1.9 mmol/L   Comment NOTIFIED PHYSICIAN   Comprehensive metabolic panel     Status: Abnormal   Collection Time: 08/27/24 11:51 PM  Result Value Ref Range   Sodium 141 135 - 145 mmol/L   Potassium 3.8 3.5 - 5.1 mmol/L   Chloride 104  98 - 111 mmol/L   CO2 24 22 - 32 mmol/L   Glucose, Bld 171 (H) 70 - 99 mg/dL   BUN 15 8 - 23 mg/dL   Creatinine, Ser 9.36 0.44 - 1.00 mg/dL   Calcium  9.6 8.9 - 10.3 mg/dL   Total Protein 6.6 6.5 - 8.1 g/dL   Albumin 3.9 3.5 - 5.0 g/dL   AST 18 15 - 41 U/L   ALT 16 0 - 44 U/L   Alkaline Phosphatase 87 38 - 126 U/L   Total Bilirubin 0.3 0.0 - 1.2 mg/dL   GFR, Estimated >39 >39 mL/min   Anion gap 13 5 - 15  CBC     Status: Abnormal   Collection Time: 08/27/24 11:51 PM  Result Value Ref Range   WBC 13.7 (H) 4.0 - 10.5 K/uL   RBC 4.54 3.87 - 5.11 MIL/uL   Hemoglobin 12.5 12.0 - 15.0 g/dL   HCT 58.2 63.9 - 53.9 %   MCV 91.9 80.0 - 100.0 fL   MCH 27.5 26.0 - 34.0 pg   MCHC 30.0 30.0 - 36.0 g/dL   RDW 86.2 88.4 - 84.4 %   Platelets 284 150 - 400 K/uL   nRBC 0.0 0.0 - 0.2 %  Ethanol     Status: Abnormal   Collection Time: 08/27/24 11:51 PM  Result Value Ref Range   Alcohol, Ethyl (B) 31 (H) <15 mg/dL  Protime-INR     Status: None   Collection Time: 08/27/24 11:51 PM  Result Value Ref Range   Prothrombin Time 12.9 11.4 - 15.2 seconds   INR 0.9 0.8 - 1.2  Urinalysis, Routine w reflex microscopic -Urine, Clean Catch     Status: Abnormal   Collection Time: 08/28/24 12:50 AM  Result Value Ref Range   Color, Urine YELLOW YELLOW   APPearance CLEAR CLEAR   Specific Gravity, Urine >1.046 (H) 1.005 - 1.030   pH 5.0 5.0 - 8.0   Glucose, UA NEGATIVE NEGATIVE mg/dL   Hgb urine dipstick NEGATIVE NEGATIVE   Bilirubin Urine NEGATIVE NEGATIVE   Ketones, ur 5 (A) NEGATIVE mg/dL   Protein, ur NEGATIVE NEGATIVE mg/dL   Nitrite POSITIVE (A) NEGATIVE   Leukocytes,Ua NEGATIVE NEGATIVE   RBC / HPF 0-5 0 - 5 RBC/hpf   WBC, UA 0-5 0 - 5 WBC/hpf   Bacteria, UA MANY (A) NONE SEEN   Squamous Epithelial / HPF 0-5 0 - 5 /HPF  Lactic acid, plasma     Status: Abnormal   Collection Time: 08/28/24  6:35 AM  Result Value Ref Range   Lactic Acid, Venous 3.3 (HH) 0.5 - 1.9 mmol/L  Hemoglobin A1c      Status: Abnormal   Collection Time: 08/28/24  6:35 AM  Result Value Ref Range   Hgb A1c MFr Bld 6.8 (H) 4.8 - 5.6 %   Mean Plasma Glucose 148.46 mg/dL  CBC     Status: Abnormal   Collection Time: 08/28/24  6:35 AM  Result Value Ref Range   WBC 14.7 (H) 4.0 - 10.5 K/uL   RBC 4.43 3.87 - 5.11 MIL/uL   Hemoglobin 12.7 12.0 - 15.0 g/dL   HCT 59.3 63.9 -  46.0 %   MCV 91.6 80.0 - 100.0 fL   MCH 28.7 26.0 - 34.0 pg   MCHC 31.3 30.0 - 36.0 g/dL   RDW 86.2 88.4 - 84.4 %   Platelets 247 150 - 400 K/uL   nRBC 0.0 0.0 - 0.2 %  Procalcitonin     Status: None   Collection Time: 08/28/24  7:00 AM  Result Value Ref Range   Procalcitonin <0.10 ng/mL  CBG monitoring, ED     Status: Abnormal   Collection Time: 08/28/24  8:54 AM  Result Value Ref Range   Glucose-Capillary 153 (H) 70 - 99 mg/dL  Glucose, capillary     Status: Abnormal   Collection Time: 08/28/24  2:34 PM  Result Value Ref Range   Glucose-Capillary 133 (H) 70 - 99 mg/dL     Imaging or Labs ordered: None  Medical history and chart was reviewed and case discussed with medical provider.  Assessment/Plan: 73 year old female with a right periprosthetic supracondylar still femur fracture.  Due to the unstable nature of her injury I recommend proceeding with open reduction internal fixation.  Risks and benefits were discussed with the patient.  Risks include but not limited to bleeding, infection, malunion, nonunion, hardware failure, hardware irritation, nerve or blood vessel injury, knee stiffness, DVT, and the possibility anesthetic complications.  She agreed to proceed with surgery and consent was obtained.  Franky MYRTIS Light, MD [facility] [phone] (office) [email or web address]

## 2024-08-28 NOTE — Progress Notes (Signed)
 Orthopedics Progress Note  Subjective: Patient still having right distal thigh pain from her fracture but better since application of the knee immobilizer. No other complaints  Objective:  Vitals:   08/28/24 0327 08/28/24 0727  BP:  (!) 128/91  Pulse:  (!) 101  Resp:  12  Temp: 98.9 F (37.2 C) 98.4 F (36.9 C)  SpO2:  92%    General: Awake and alert  Musculoskeletal: neck and back non tender, normal cervical ROM, conjugate gaze Pain free bilateral UE ROM Right leg NVI in knee immobilizer Left leg with pain free AROM of the hip knee and ankle Neurovascularly intact  Lab Results  Component Value Date   WBC 14.7 (H) 08/28/2024   HGB 12.7 08/28/2024   HCT 40.6 08/28/2024   MCV 91.6 08/28/2024   PLT 247 08/28/2024       Component Value Date/Time   NA 141 08/27/2024 2351   NA 141 08/30/2022 1204   K 3.8 08/27/2024 2351   CL 104 08/27/2024 2351   CO2 24 08/27/2024 2351   GLUCOSE 171 (H) 08/27/2024 2351   BUN 15 08/27/2024 2351   BUN 11 08/30/2022 1204   CREATININE 0.63 08/27/2024 2351   CALCIUM  9.6 08/27/2024 2351   GFRNONAA >60 08/27/2024 2351   GFRAA >60 04/20/2017 0719    Lab Results  Component Value Date   INR 0.9 08/27/2024   INR 0.94 04/13/2017   INR 0.98 07/21/2014    Assessment/Plan: Discussed this case with Dr Kendal, ortho trauma who will assume her orthopedic care and plans  OPEN REDUCTION INTERNAL FIXATION (ORIF) DISTAL FEMUR FRACTURE at [facility] hospital this afternoon. Will need to initiate transfer by the admitting team to [facility].  Appreciate [facility] management and assistance with this patient.   Kelly Taylor. Kay, MD 08/28/2024 7:48 AM

## 2024-08-28 NOTE — H&P (Signed)
 History and Physical    Kelly Taylor FMW:[id number] DOB: 1951/02/19 DOA: 08/27/2024  PCP: Gerome Brunet, DO  Patient coming from: Home  Chief Complaint: Fall  HPI: Kelly Taylor is a 73 y.o. female with medical history significant of severe OSA on CPAP, mild obstructive airway disease, class III obesity (BMI 43.85), chronic back pain, type 2 diabetes, anemia, ascending aorta dilation, depression, GERD, hypothyroidism, IBS, vertigo, history of bilateral total knee arthroplasty, hyperlipidemia presenting the ED via EMS after a fall tonight.  Patient fell on her right side and hit her head and right knee.  Presented with swelling of right knee and swelling and laceration of lip, nosebleed.  She had 2 glasses of wine tonight.  She was given fentanyl  by EMS and oxygen saturation dropped to the high 80s requiring 2 L [place].  Labs notable for WBC count 13.7, glucose 171, lactic acid 3.0, ethanol level 31.  UA with positive nitrite, negative leukocytes, and microscopy showing 0-5 WBCs and many bacteria.  X-ray of right knee showing transverse fracture just above right knee prosthesis with posterior displacement of the distal femoral fracture.  X-rays of chest and right hip/pelvis showing no acute findings.  CT head/maxillofacial/C-spine and chest/abdomen/pelvis with contrast negative for acute traumatic findings.  Patient was given fentanyl , Dilaudid , Zofran , Compazine, and 1 L IV fluids in the ED.  Orthopedics consulted and [facility] called to admit.  Patient states she was in her usual state of health, feeling well.  She was walking on her driveway when she accidentally tripped on a paver and fell.  She reports falling on her right knee and hitting the right side of her face/head on floor.  Denies loss of consciousness.  She was not able to get up and walk due to severe pain in her right knee.  She feels nauseous due to severe pain whenever she tries to move her leg.  Denies vomiting or abdominal pain.  Denies chest  pain or shortness of breath.  Denies any urinary symptoms.  Review of Systems:  Review of Systems  All other systems reviewed and are negative.   Past Medical History:  Diagnosis Date   Anemia    hx of in 20's   Aortic atherosclerosis    Arthritis    Ascending aorta dilatation    42mm by CT 10/23   Asthma    Back pain    Breast mass, left 2003   Chronic back pain    seeing Pain specialist   Complication of anesthesia    shallow breathing with general   Constipation    DDD (degenerative disc disease)    Depression    Diabetes mellitus    borderline   DJD (degenerative joint disease)    Family history of heart attack    GERD (gastroesophageal reflux disease)    H/O scarlet fever    Hemorrhoids    History of chicken pox    History of measles, mumps, or rubella as child   all 3   Hx of bladder infections    Hypothyroidism    Irritable bowel syndrome    Joint pain    Lumbar stenosis    RFA done May 2015   Menopausal symptoms 2003   Obesity, morbid (HCC) 2006   Pericarditis    hx of   Pneumonia    hx of 5 years ago   Polyp of colon    removed during colonoscopy   SOBOE (shortness of breath on exertion)    Spinal  headache    x1   Thyroid  condition    Trichomonas    Urge incontinence 2005   Vertigo    over a year was last episode   Yeast in stool     Past Surgical History:  Procedure Laterality Date   ABDOMINAL HYSTERECTOMY  02/18/1990   APPENDECTOMY  age 31   BLADDER SUSPENSION     BREAST LUMPECTOMY Left 11/20/2001   BREAST REDUCTION SURGERY  11/20/1990   CHOLECYSTECTOMY  11/20/2008   COLONOSCOPY  03/20/2014   4 polyps-benign   DILATION AND CURETTAGE OF UTERUS     FOOT SURGERY Right    pinched nerve   HAND SURGERY Right    right-Dr. Camella   HERNIA REPAIR Right 11/20/1990   INTERSTIM IMPLANT PLACEMENT Right 11/20/2001   for incontinence   KNEE SURGERY     bilateral knee; x3 right/ x2 left   REDUCTION MAMMAPLASTY     REVERSE SHOULDER  ARTHROPLASTY Left 04/19/2017   Procedure: REVERSE SHOULDER ARTHROPLASTY;  Surgeon: Dozier Soulier, MD;  Location: [facility] OR;  Service: Orthopedics;  Laterality: Left;  REVERSE SHOULDER ARTHROPLASTY   REVERSE SHOULDER ARTHROPLASTY Right 07/19/2023   Procedure: REVERSE SHOULDER ARTHROPLASTY;  Surgeon: Dozier Soulier, MD;  Location: [facility];  Service: Orthopedics;  Laterality: Right;   ROTATOR CUFF REPAIR  11/20/2006   right, almost complete reconstruction, bone anchors   TONSILLECTOMY  age 23   TOTAL HIP ARTHROPLASTY Right 07/30/2014   Procedure: RIGHT TOTAL HIP ARTHROPLASTY ANTERIOR APPROACH;  Surgeon: Lonni CINDERELLA Poli, MD;  Location: [facility];  Service: Orthopedics;  Laterality: Right;   TOTAL KNEE ARTHROPLASTY Right 04/07/2013   Procedure: RIGHT TOTAL KNEE ARTHROPLASTY;  Surgeon: Dempsey LULLA Moan, MD;  Location: [facility];  Service: Orthopedics;  Laterality: Right;   TOTAL KNEE ARTHROPLASTY Left 09/22/2013   Procedure: LEFT TOTAL KNEE ARTHROPLASTY;  Surgeon: Dempsey LULLA Moan, MD;  Location: [facility];  Service: Orthopedics;  Laterality: Left;     reports that she has never smoked. She has never used smokeless tobacco. She reports current alcohol use. She reports that she does not use drugs.  Allergies  Allergen Reactions   Oxycodone  Shortness Of Breath and Other (See Comments)    Turned purple and could not breathe- with an injection; Can take vicodin/morphine     Penicillins Anaphylaxis, Hives and Other (See Comments)    Pt was a baby when this happened Has patient had a PCN reaction causing immediate rash, facial/tongue/throat swelling, SOB or lightheadedness with hypotension: Yes Has patient had a PCN reaction causing severe rash involving mucus membranes or skin necrosis: Unknown Has patient had a PCN reaction that required hospitalization: Unknown Has patient had a PCN reaction occurring within the last 10 years: No    Trazodone Other (See Comments)    Nightmares   Lyrica  [Pregabalin] Other (See Comments)    Caused nightmares   Nabumetone Nausea And Vomiting   Sulfa Antibiotics Other (See Comments)    Caused fevers    Family History  Problem Relation Age of Onset   Cancer Mother    Hypertension Mother    Diabetes Mother    Lung cancer Mother    Heart disease Mother    Obesity Mother    Depression Father    Stroke Paternal Grandfather    Ovarian cancer Maternal Aunt    Breast cancer Maternal Aunt     Prior to Admission medications   Medication Sig Start Date End Date Taking? Authorizing Provider  albuterol  (PROVENTIL  HFA;VENTOLIN  HFA)  108 (90 BASE) MCG/ACT inhaler Inhale 2 puffs into the lungs every 4 (four) hours as needed for wheezing or shortness of breath. 11/15/15  Yes Belvie Dempsey BROCKS, PA  aspirin  EC 81 MG tablet Take 81 mg by mouth daily. Swallow whole.   Yes [provider]  diclofenac sodium (VOLTAREN) 1 % GEL Apply 2 g topically 4 (four) times daily as needed (for pain).   Yes [provider]  docusate sodium  (COLACE) 100 MG capsule Take 200 mg by mouth every evening.   Yes [provider]  gabapentin (NEURONTIN) 100 MG capsule Take 100 mg by mouth daily as needed. 07/21/19  Yes [provider]  hydrocortisone  (ANUSOL -HC) 2.5 % rectal cream Place 1 Application rectally 2 (two) times daily. Patient taking differently: Place 1 Application rectally 2 (two) times daily as needed. 07/04/24  Yes Rancour, Garnette, MD  hydrocortisone  (ANUSOL -HC) 25 MG suppository Place 1 suppository (25 mg total) rectally 2 (two) times daily. Patient taking differently: Place 25 mg rectally 2 (two) times daily as needed. 07/11/24  Yes Griselda Norris, MD  ipratropium (ATROVENT) 0.06 % nasal spray Place 2 sprays into both nostrils 4 (four) times daily as needed (for seasonal allergies).   Yes [provider]  Lactulose  20 GM/30ML SOLN Take 15 mLs (10 g total) by mouth daily as needed (constipation). 07/11/24  Yes Griselda Norris, MD  magnesium  oxide (MAG-OX) 400 (240 Mg) MG tablet Take 400 mg by mouth every evening.   Yes [provider]  metFORMIN  (GLUCOPHAGE -XR) 500 MG 24 hr tablet Take 500 mg by mouth in the morning and at bedtime.   Yes [provider]  Misc Natural Products (FLEX-A-MIN JOINT FLEX PO) Take 1 tablet by mouth in the morning. Instaflex Advanced Joint Support   Yes [provider]  Multiple Vitamins-Minerals (WOMENS MULTIVITAMIN) TABS Take 1 tablet by mouth daily with breakfast.   Yes [provider]  NON FORMULARY Take 2 tablets by mouth daily. WEEM Hair Skin and Nails Gummies with Biotin   Yes [provider]  Omega-3 Fatty Acids (FISH OIL PO) Take 2 capsules by mouth daily after breakfast.   Yes [provider]  omeprazole  (PRILOSEC) 20 MG capsule Take 20 mg by mouth daily as needed. 11/06/16  Yes [provider]  polyethylene glycol (MIRALAX  / GLYCOLAX ) packet Take 17 g by mouth daily.   Yes [provider]  Probiotic Product (PROBIOTIC PO) Take 1 capsule by mouth every evening.   Yes [provider]  simvastatin  (ZOCOR ) 10 MG tablet Take 10 mg by mouth at bedtime.   Yes [provider]  solifenacin (VESICARE) 10 MG tablet Take 10 mg by mouth in the morning.   Yes [provider]  SYNTHROID  75 MCG tablet Take 75 mcg by mouth daily before breakfast.   Yes [provider]  traMADol  (ULTRAM ) 50 MG tablet Take 50 mg by mouth 3 (three) times daily as needed (for pain).   Yes [provider]  chlorhexidine  (HIBICLENS ) 4 % external liquid Apply 15 mLs (1 Application total) topically as directed for 30 doses. Use as directed daily for 5 days every other week for 6 weeks. Patient not taking: Reported on 05/04/2024 07/19/23   [place], [facility], PA-C  cyclobenzaprine  (FLEXERIL ) 10 MG tablet Take 1 tablet (10 mg total) by mouth 3 (three) times daily as needed for muscle spasms. Patient not  taking: Reported on 08/28/2024 07/19/23   [place], [facility], PA-C  docusate sodium  (COLACE) 100 MG  capsule Take 1 capsule (100 mg total) by mouth every 12 (twelve) hours. Patient not taking: Reported on 08/28/2024 07/04/24   Carita Senior, MD  LORazepam  (ATIVAN ) 0.5 MG tablet Take 1 tablet (0.5 mg total) by mouth 3 (three) times daily as needed (vertigo). Patient not taking: Reported on 08/28/2024 05/05/24   Raford Lenis, MD  meclizine  (ANTIVERT ) 25 MG tablet Take 1 tablet (25 mg total) by mouth 3 (three) times daily as needed for dizziness. Patient not taking: Reported on 08/28/2024 05/05/24   Raford Lenis, MD  nitrofurantoin , macrocrystal-monohydrate, (MACROBID ) 100 MG capsule Take 1 capsule (100 mg total) by mouth 2 (two) times daily. Patient not taking: Reported on 08/28/2024 07/04/24   Carita Senior, MD    Physical Exam: Vitals:   08/28/24 0012 08/28/24 0030 08/28/24 0315 08/28/24 0327  BP: 126/75 112/76 (!) 147/93   Pulse: 97 95 (!) 107   Resp: 17 15 16    Temp: 98.3 F (36.8 C)   98.9 F (37.2 C)  TempSrc: Oral   Oral  SpO2: 94% 92% 96%   Weight:      Height:        Physical Exam Vitals reviewed.  Constitutional:      General: She is not in acute distress. HENT:     Head: Normocephalic.     Comments: Abrasions noted on right side of face/nose, upper lip swelling on the right Eyes:     Extraocular Movements: Extraocular movements intact.  Cardiovascular:     Rate and Rhythm: Normal rate and regular rhythm.     Pulses: Normal pulses.  Pulmonary:     Effort: Pulmonary effort is normal. No respiratory distress.     Breath sounds: Normal breath sounds.  Abdominal:     General: Bowel sounds are normal.     Palpations: Abdomen is soft.     Tenderness: There is no abdominal tenderness. There is no guarding.  Musculoskeletal:     Cervical back: Normal range of motion.     Comments: Right lower extremity neurovascularly intact  Skin:    General: Skin is warm and dry.   Neurological:     General: No focal deficit present.     Mental Status: She is alert and oriented to person, place, and time.     Labs on Admission: I have personally reviewed following labs and imaging studies  CBC: Recent Labs  Lab 08/27/24 2300 08/27/24 2351  WBC  --  13.7*  HGB 13.6 12.5  HCT 40.0 41.7  MCV  --  91.9  PLT  --  284   Basic Metabolic Panel: Recent Labs  Lab 08/27/24 2300 08/27/24 2351  NA 141 141  K 3.8 3.8  CL 104 104  CO2  --  24  GLUCOSE 180* 171*  BUN 16 15  CREATININE 0.70 0.63  CALCIUM   --  9.6   GFR: Estimated Creatinine Clearance: 90.7 mL/min (by C-G formula based on SCr of 0.63 mg/dL). Liver Function Tests: Recent Labs  Lab 08/27/24 2351  AST 18  ALT 16  ALKPHOS 87  BILITOT 0.3  PROT 6.6  ALBUMIN 3.9   No results for input(s): LIPASE, AMYLASE in the last 168 hours. No results for input(s): AMMONIA in the last 168 hours. Coagulation Profile: Recent Labs  Lab 08/27/24 2351  INR 0.9   Cardiac Enzymes: No results for input(s): CKTOTAL, CKMB, CKMBINDEX, TROPONINI in the last 168 hours. BNP (last 3 results) No results for input(s): PROBNP in the last 8760 hours. HbA1C:  No results for input(s): HGBA1C in the last 72 hours. CBG: No results for input(s): GLUCAP in the last 168 hours. Lipid Profile: No results for input(s): CHOL, HDL, LDLCALC, TRIG, CHOLHDL, LDLDIRECT in the last 72 hours. Thyroid  Function Tests: No results for input(s): TSH, T4TOTAL, FREET4, T3FREE, THYROIDAB in the last 72 hours. Anemia Panel: No results for input(s): VITAMINB12, FOLATE, FERRITIN, TIBC, IRON, RETICCTPCT in the last 72 hours. Urine analysis:    Component Value Date/Time   COLORURINE YELLOW 08/28/2024 0050   APPEARANCEUR CLEAR 08/28/2024 0050   LABSPEC >1.046 (H) 08/28/2024 0050   PHURINE 5.0 08/28/2024 0050   GLUCOSEU NEGATIVE 08/28/2024 0050   HGBUR NEGATIVE 08/28/2024 0050    BILIRUBINUR NEGATIVE 08/28/2024 0050   KETONESUR 5 (A) 08/28/2024 0050   PROTEINUR NEGATIVE 08/28/2024 0050   UROBILINOGEN 0.2 07/21/2014 1118   NITRITE POSITIVE (A) 08/28/2024 0050   LEUKOCYTESUR NEGATIVE 08/28/2024 0050    Radiological Exams on Admission: CT CHEST ABDOMEN PELVIS W CONTRAST Result Date: 08/28/2024 CLINICAL DATA:  Polytrauma, blunt EXAM: CT CHEST, ABDOMEN, AND PELVIS WITH CONTRAST TECHNIQUE: Multidetector CT imaging of the chest, abdomen and pelvis was performed following the standard protocol during bolus administration of intravenous contrast. RADIATION DOSE REDUCTION: This exam was performed according to the departmental dose-optimization program which includes automated exposure control, adjustment of the mA and/or kV according to patient size and/or use of iterative reconstruction technique. CONTRAST:  OMNIPAQUE  IOHEXOL  300 MG/ML  SOLN COMPARISON:  CT abdomen pelvis 07/10/2024, chest x-ray 06/18/2023 FINDINGS: CHEST: Cardiovascular: No aortic injury. Aneurysmal ascending thoracic aorta (4.1 cm). The thoracic aorta is normal in caliber. The heart is normal in size. No significant pericardial effusion. Mediastinum/Nodes: No pneumomediastinum. No mediastinal hematoma. The esophagus is unremarkable. The thyroid  is unremarkable. The central airways are patent. No mediastinal, hilar, or axillary lymphadenopathy. Lungs/Pleura: No focal consolidation. No pulmonary nodule. No pulmonary mass. No pulmonary contusion or laceration. No pneumatocele formation. No pleural effusion. No pneumothorax. No hemothorax. Musculoskeletal/Chest wall: No chest wall mass. No acute rib or sternal fracture. No spinal fracture. Total right shoulder arthroplasty. Multilevel degenerative changes of the spine. ABDOMEN / PELVIS: Hepatobiliary: Not enlarged. No focal lesion. No laceration or subcapsular hematoma. Status post cholecystectomy. No biliary ductal dilatation. Mitral annular calcification. Pancreas:  Normal pancreatic contour. No main pancreatic duct dilatation. Spleen: Not enlarged. No focal lesion. No laceration, subcapsular hematoma, or vascular injury. Adrenals/Urinary Tract: No nodularity bilaterally. Bilateral kidneys enhance symmetrically. No hydronephrosis. No contusion, laceration, or subcapsular hematoma. Fluid is lesion of left kidney likely represents a simple renal cyst. Simple renal cysts, in the absence of clinically indicated signs/symptoms, require no independent follow-up. No injury to the vascular structures or collecting systems. No hydroureter. The urinary bladder is unremarkable. On delayed imaging, there is no urothelial wall thickening and there are no filling defects in the opacified portions of the bilateral collecting systems or ureters. Stomach/Bowel: No small or large bowel wall thickening or dilatation. The appendix is not definitely identified with no inflammatory changes in the right lower quadrant to suggest acute appendicitis. Vasculature/Lymphatics: Mild atherosclerotic plaque. No abdominal aorta or iliac aneurysm. No active contrast extravasation or pseudoaneurysm. No abdominal, pelvic, inguinal lymphadenopathy. Reproductive: Normal. Other: No simple free fluid ascites. No pneumoperitoneum. No hemoperitoneum. No mesenteric hematoma identified. No organized fluid collection. Musculoskeletal: No significant soft tissue hematoma. Small fat containing left inguinal hernia. No acute pelvic fracture. Total right hip arthroplasty. No spinal fracture. Multilevel degenerative change of the spine. Appear L4 posterolateral and interbody surgical  hardware fusion. Neural stimulator with the anterior to the sacrum. Other ports and devices: None. IMPRESSION: 1. No acute intrathoracic, intra-abdominal, intrapelvic traumatic injury. 2. No acute fracture or traumatic malalignment of the thoracic or lumbar spine. 3. Aneurysmal ascending thoracic aorta (4.1 cm). Recommend annual imaging followup  by CTA or MRA. This recommendation follows 2010 ACCF/AHA/AATS/ACR/ASA/SCA/SCAI/SIR/STS/SVM Guidelines for the Diagnosis and Management of Patients with Thoracic Aortic Disease. Circulation. 2010; 121: Z733-z630. Aortic aneurysm NOS (ICD10-I71.9). Electronically Signed   By: Morgane  Naveau M.D.   On: 08/28/2024 00:24   CT HEAD WO CONTRAST Result Date: 08/28/2024 CLINICAL DATA:  Head trauma, moderate-severe; Polytrauma, blunt; Facial trauma, blunt. Fall EXAM: CT HEAD WITHOUT CONTRAST CT MAXILLOFACIAL WITHOUT CONTRAST CT CERVICAL SPINE WITHOUT CONTRAST TECHNIQUE: Multidetector CT imaging of the head, cervical spine, and maxillofacial structures were performed using the standard protocol without intravenous contrast. Multiplanar CT image reconstructions of the cervical spine and maxillofacial structures were also generated. RADIATION DOSE REDUCTION: This exam was performed according to the departmental dose-optimization program which includes automated exposure control, adjustment of the mA and/or kV according to patient size and/or use of iterative reconstruction technique. COMPARISON:  CT head 02/11/2014 FINDINGS: CT HEAD FINDINGS Brain: Trace patchy and confluent areas of decreased attenuation are noted throughout the deep and periventricular white matter of the cerebral hemispheres bilaterally, compatible with chronic microvascular ischemic disease. No evidence of large-territorial acute infarction. No parenchymal hemorrhage. No mass lesion. No extra-axial collection. No mass effect or midline shift. No hydrocephalus. Basilar cisterns are patent. Vascular: No hyperdense vessel. Skull: No acute fracture or focal lesion. Other: None. CT MAXILLOFACIAL FINDINGS Osseous: No fracture or mandibular dislocation. No destructive process. Sinuses/Orbits: Paranasal sinuses and mastoid air cells are clear. The orbits are unremarkable. Soft tissues: Negative. CT CERVICAL SPINE FINDINGS Alignment: Grade 1 anterolisthesis of C4  on C5. Grade 1 anterolisthesis of C6 on C7. Skull base and vertebrae: Multilevel severe degenerative changes of the spine. No severe osseous neural foraminal or central canal stenosis. No acute fracture. No aggressive appearing focal osseous lesion or focal pathologic process. Soft tissues and spinal canal: No prevertebral fluid or swelling. No visible canal hematoma. Upper chest: Unremarkable. Other: Aneurysmal ascending thoracic aorta measuring up to 4.1 cm in caliber. IMPRESSION: 1. No acute intracranial abnormality. 2.  No acute displaced facial fracture. 3. No acute displaced fracture or traumatic listhesis of the cervical spine. 4. Aneurysmal ascending thoracic aorta (4.1 cm). Recommend annual imaging followup by CTA or MRA. This recommendation follows 2010 ACCF/AHA/AATS/ACR/ASA/SCA/SCAI/SIR/STS/SVM Guidelines for the Diagnosis and Management of Patients with Thoracic Aortic Disease. Circulation. 2010; 121: [id number]. Aortic aneurysm NOS (ICD10-I71.9). Electronically Signed   By: Morgane  Naveau M.D.   On: 08/28/2024 00:16   CT MAXILLOFACIAL WO CONTRAST Result Date: 08/28/2024 CLINICAL DATA:  Head trauma, moderate-severe; Polytrauma, blunt; Facial trauma, blunt. Fall EXAM: CT HEAD WITHOUT CONTRAST CT MAXILLOFACIAL WITHOUT CONTRAST CT CERVICAL SPINE WITHOUT CONTRAST TECHNIQUE: Multidetector CT imaging of the head, cervical spine, and maxillofacial structures were performed using the standard protocol without intravenous contrast. Multiplanar CT image reconstructions of the cervical spine and maxillofacial structures were also generated. RADIATION DOSE REDUCTION: This exam was performed according to the departmental dose-optimization program which includes automated exposure control, adjustment of the mA and/or kV according to patient size and/or use of iterative reconstruction technique. COMPARISON:  CT head 02/11/2014 FINDINGS: CT HEAD FINDINGS Brain: Trace patchy and confluent areas of decreased  attenuation are noted throughout the deep and periventricular white matter of the cerebral hemispheres bilaterally,  compatible with chronic microvascular ischemic disease. No evidence of large-territorial acute infarction. No parenchymal hemorrhage. No mass lesion. No extra-axial collection. No mass effect or midline shift. No hydrocephalus. Basilar cisterns are patent. Vascular: No hyperdense vessel. Skull: No acute fracture or focal lesion. Other: None. CT MAXILLOFACIAL FINDINGS Osseous: No fracture or mandibular dislocation. No destructive process. Sinuses/Orbits: Paranasal sinuses and mastoid air cells are clear. The orbits are unremarkable. Soft tissues: Negative. CT CERVICAL SPINE FINDINGS Alignment: Grade 1 anterolisthesis of C4 on C5. Grade 1 anterolisthesis of C6 on C7. Skull base and vertebrae: Multilevel severe degenerative changes of the spine. No severe osseous neural foraminal or central canal stenosis. No acute fracture. No aggressive appearing focal osseous lesion or focal pathologic process. Soft tissues and spinal canal: No prevertebral fluid or swelling. No visible canal hematoma. Upper chest: Unremarkable. Other: Aneurysmal ascending thoracic aorta measuring up to 4.1 cm in caliber. IMPRESSION: 1. No acute intracranial abnormality. 2.  No acute displaced facial fracture. 3. No acute displaced fracture or traumatic listhesis of the cervical spine. 4. Aneurysmal ascending thoracic aorta (4.1 cm). Recommend annual imaging followup by CTA or MRA. This recommendation follows 2010 ACCF/AHA/AATS/ACR/ASA/SCA/SCAI/SIR/STS/SVM Guidelines for the Diagnosis and Management of Patients with Thoracic Aortic Disease. Circulation. 2010; 121: [id number]. Aortic aneurysm NOS (ICD10-I71.9). Electronically Signed   By: Morgane  Naveau M.D.   On: 08/28/2024 00:16   CT CERVICAL SPINE WO CONTRAST Result Date: 08/28/2024 CLINICAL DATA:  Head trauma, moderate-severe; Polytrauma, blunt; Facial trauma, blunt. Fall EXAM:  CT HEAD WITHOUT CONTRAST CT MAXILLOFACIAL WITHOUT CONTRAST CT CERVICAL SPINE WITHOUT CONTRAST TECHNIQUE: Multidetector CT imaging of the head, cervical spine, and maxillofacial structures were performed using the standard protocol without intravenous contrast. Multiplanar CT image reconstructions of the cervical spine and maxillofacial structures were also generated. RADIATION DOSE REDUCTION: This exam was performed according to the departmental dose-optimization program which includes automated exposure control, adjustment of the mA and/or kV according to patient size and/or use of iterative reconstruction technique. COMPARISON:  CT head 02/11/2014 FINDINGS: CT HEAD FINDINGS Brain: Trace patchy and confluent areas of decreased attenuation are noted throughout the deep and periventricular white matter of the cerebral hemispheres bilaterally, compatible with chronic microvascular ischemic disease. No evidence of large-territorial acute infarction. No parenchymal hemorrhage. No mass lesion. No extra-axial collection. No mass effect or midline shift. No hydrocephalus. Basilar cisterns are patent. Vascular: No hyperdense vessel. Skull: No acute fracture or focal lesion. Other: None. CT MAXILLOFACIAL FINDINGS Osseous: No fracture or mandibular dislocation. No destructive process. Sinuses/Orbits: Paranasal sinuses and mastoid air cells are clear. The orbits are unremarkable. Soft tissues: Negative. CT CERVICAL SPINE FINDINGS Alignment: Grade 1 anterolisthesis of C4 on C5. Grade 1 anterolisthesis of C6 on C7. Skull base and vertebrae: Multilevel severe degenerative changes of the spine. No severe osseous neural foraminal or central canal stenosis. No acute fracture. No aggressive appearing focal osseous lesion or focal pathologic process. Soft tissues and spinal canal: No prevertebral fluid or swelling. No visible canal hematoma. Upper chest: Unremarkable. Other: Aneurysmal ascending thoracic aorta measuring up to 4.1 cm  in caliber. IMPRESSION: 1. No acute intracranial abnormality. 2.  No acute displaced facial fracture. 3. No acute displaced fracture or traumatic listhesis of the cervical spine. 4. Aneurysmal ascending thoracic aorta (4.1 cm). Recommend annual imaging followup by CTA or MRA. This recommendation follows 2010 ACCF/AHA/AATS/ACR/ASA/SCA/SCAI/SIR/STS/SVM Guidelines for the Diagnosis and Management of Patients with Thoracic Aortic Disease. Circulation. 2010; 121: [id number]. Aortic aneurysm NOS (ICD10-I71.9). Electronically Signed   By: Morgane  Margarite M.D.   On: 08/28/2024 00:16   DG Chest Port 1 View Result Date: 08/27/2024 CLINICAL DATA:  Preoperative evaluation EXAM: PORTABLE CHEST 1 VIEW COMPARISON:  06/18/2023 FINDINGS: Cardiac shadow is within normal limits. Tortuous thoracic aorta is noted. Lungs are clear bilaterally. Bilateral shoulder replacements are seen. IMPRESSION: No acute abnormality noted. Electronically Signed   By: Oneil Devonshire M.D.   On: 08/27/2024 22:59   DG Knee 1-2 Views Right Result Date: 08/27/2024 CLINICAL DATA:  Recent fall with right knee pain, initial encounter EXAM: RIGHT KNEE - 2 VIEW COMPARISON:  None Available. FINDINGS: Right knee prosthesis is noted. Immediately superior to the prosthesis there is a transverse fracture through the distal femoral metaphysis. The distal fracture fragment is posteriorly displaced with respect proximal femoral shaft. IMPRESSION: Transverse fracture just above the patient's right knee prosthesis with posterior displacement of the distal femoral fracture. Electronically Signed   By: Oneil Devonshire M.D.   On: 08/27/2024 22:59   DG Hip Unilat W or Wo Pelvis 2-3 Views Right Result Date: 08/27/2024 CLINICAL DATA:  Recent fall with right hip pain, initial encounter EXAM: DG HIP (WITH OR WITHOUT PELVIS) 3V RIGHT COMPARISON:  07/08/2019 FINDINGS: Pelvic ring is intact. Right hip replacement is noted. Right InterStim device is seen. Postsurgical changes in the  lower lumbar spine are noted. Mild degenerative changes of the left hip joint are seen. No acute fracture is noted. IMPRESSION: Chronic changes without acute abnormality. Electronically Signed   By: Oneil Devonshire M.D.   On: 08/27/2024 22:53    Assessment and Plan  Right periprosthetic distal femur fracture Secondary to a mechanical fall.  Orthopedics consulted and planning on surgery in the morning.  Knee immobilizer in place, bedrest.  Keep n.p.o. after midnight, IV fluid hydration.  Continue pain management.  Placed on bowel regimen in the setting of opiate use.  Hold aspirin .  She is not on anticoagulation.  EKG ordered for preop evaluation.  Leukocytosis, elevated lactate ?Sepsis Labs showing WBC count 13.7, lactic acid 3.0.  No fever, tachycardia, or hypotension at present.  UTI less likely as UA with positive nitrite and many bacteria but no evidence of pyuria.  Patient is not endorsing any urinary symptoms.  Follow-up urine culture.  Continue IV fluid hydration.  Repeat labs ordered to check WBC count and lactate.  Blood cultures ordered.  Check procalcitonin level.   AAA CT showing aneurysmal ascending thoracic aorta (4.1 cm).  Radiologist recommending annual imaging followup by CTA or MRA.  Severe OSA Continue nightly CPAP.  Type 2 diabetes Glucose in the 170s.  Last A1c 7.2 in August 2024, repeat ordered.  Placed on sensitive sliding scale insulin  every 4 hours for now as patient is NPO.  Hypothyroidism Continue Synthroid .  Hyperlipidemia Continue Zocor .  Overactive bladder Placed on fesoterodine  which is hospital formulary replacement for her home solifenacin.  DVT prophylaxis: SCDs Code Status: Full Code (discussed with the patient) Level of care: Telemetry bed Admission status: It is my clinical opinion that admission to INPATIENT is reasonable and necessary because of the expectation that this patient will require hospital care that crosses at least 2 midnights to treat  this condition based on the medical complexity of the problems presented.  Given the aforementioned information, the predictability of an adverse outcome is felt to be significant.  Editha Ram MD [facility]  If 7PM-7AM, please contact night-coverage [email or web address]  08/28/2024, 5:07 AM

## 2024-08-28 NOTE — Progress Notes (Signed)
 [facility] PROGRESS NOTE    Progress Note  Kelly Taylor  FMW:[id number] DOB: [birth year] DOA: 08/27/2024 PCP: Gerome Brunet, DO     Brief Narrative:   Kelly Taylor is an 73 y.o. female past medical history significant for severe obstructive sleep apnea on CPAP, morbid obesity class III, diabetes mellitus type 2, ascending aortic dilation, hypothyroidism, history of bilateral total knee replacement comes into the ED after a fall on the day of admission fell to her right side and hit her head and right knee she has some right knee swelling and laceration, she also has a laceration of her lip and a nosebleed.  She relates prior to this she had 2 glasses of wine was given fentanyl  by EMS her saturations dropped to 2 L, white count of 13 lactic acid of 3.0 alcohol level 31.  UA was positive for nitrates and negative for leukocytes esterase.  Knee x-ray showed a transverse fracture above the knee prosthesis with posterior displacement of the distal femoral neck.,  CT of the chest, abdomen pelvis cervical C-spine maxillofacial and head no acute findings.    Assessment/Plan:   Right Peri-prosthetic fracture of shaft of femur Secondary to mechanical fall. Will place NPO. Orthopedic surgery was consulted she was placed in knee immobilizer. Started on IV fluids and a bowel regimen. She was placed on narcotics for analgesics. She denies complaining of right wrist pain and limited mobility get an x-ray. Pain is not controlled increase narcotics.  SIRS: She has a white count of 13 UA positive for nitrates and bacteria no evidence of pyuria, she denies any dysuria or any urinary symptoms. Discharge reaction is likely due to her fracture.  AAA: Annual follow-up with a CTA or MRI as an outpatient.  Severe obstructive sleep apnea: Continue CPAP.  Type 2 diabetes mellitus (HCC) A1c of 7.2. Continue sliding scale insulin  CBGs Q4.  Hypothyroidism: Continue  Synthroid .  Hyperlipidemia: continue Zocor .  Overactive bladder: She was restarted on her home meds.   DVT prophylaxis: lovneox Family Communication:none Status is: Inpatient Remains inpatient appropriate because: Acute traumatic knee fracture    Code Status:     Code Status Orders  (From admission, onward)           Start     Ordered   08/28/24 0614  Full code  Continuous       Question:  By:  Answer:  Consent: discussion documented in EHR   08/28/24 0616           Code Status History     Date Active Date Inactive Code Status Order ID Comments User Context   10/28/2020 1200 10/29/2020 0508 Full Code [id number]  Derrill Dawn, MD HOV   04/19/2017 1110 04/20/2017 1535 Full Code [id number]  Tresea Houston, PA-C Inpatient   07/30/2014 1442 08/01/2014 1614 Full Code [id number]  Vernetta Lonni GRADE, MD Inpatient   09/22/2013 1744 09/24/2013 1511 Full Code [id number]  Melodi Dempsey GAILS, MD Inpatient   04/07/2013 1834 04/09/2013 2104 Full Code [id number]  Aluisio, Dempsey GAILS, MD Inpatient         IV Access:   Peripheral IV   Procedures and diagnostic studies:   CT CHEST ABDOMEN PELVIS W CONTRAST Result Date: 08/28/2024 CLINICAL DATA:  Polytrauma, blunt EXAM: CT CHEST, ABDOMEN, AND PELVIS WITH CONTRAST TECHNIQUE: Multidetector CT imaging of the chest, abdomen and pelvis was performed following the standard protocol during bolus administration of intravenous contrast. RADIATION DOSE REDUCTION: This exam was performed according to the departmental  dose-optimization program which includes automated exposure control, adjustment of the mA and/or kV according to patient size and/or use of iterative reconstruction technique. CONTRAST:  OMNIPAQUE  IOHEXOL  300 MG/ML  SOLN COMPARISON:  CT abdomen pelvis 07/10/2024, chest x-ray 06/18/2023 FINDINGS: CHEST: Cardiovascular: No aortic injury. Aneurysmal ascending thoracic aorta (4.1 cm). The thoracic aorta is normal in caliber. The heart  is normal in size. No significant pericardial effusion. Mediastinum/Nodes: No pneumomediastinum. No mediastinal hematoma. The esophagus is unremarkable. The thyroid  is unremarkable. The central airways are patent. No mediastinal, hilar, or axillary lymphadenopathy. Lungs/Pleura: No focal consolidation. No pulmonary nodule. No pulmonary mass. No pulmonary contusion or laceration. No pneumatocele formation. No pleural effusion. No pneumothorax. No hemothorax. Musculoskeletal/Chest wall: No chest wall mass. No acute rib or sternal fracture. No spinal fracture. Total right shoulder arthroplasty. Multilevel degenerative changes of the spine. ABDOMEN / PELVIS: Hepatobiliary: Not enlarged. No focal lesion. No laceration or subcapsular hematoma. Status post cholecystectomy. No biliary ductal dilatation. Mitral annular calcification. Pancreas: Normal pancreatic contour. No main pancreatic duct dilatation. Spleen: Not enlarged. No focal lesion. No laceration, subcapsular hematoma, or vascular injury. Adrenals/Urinary Tract: No nodularity bilaterally. Bilateral kidneys enhance symmetrically. No hydronephrosis. No contusion, laceration, or subcapsular hematoma. Fluid is lesion of left kidney likely represents a simple renal cyst. Simple renal cysts, in the absence of clinically indicated signs/symptoms, require no independent follow-up. No injury to the vascular structures or collecting systems. No hydroureter. The urinary bladder is unremarkable. On delayed imaging, there is no urothelial wall thickening and there are no filling defects in the opacified portions of the bilateral collecting systems or ureters. Stomach/Bowel: No small or large bowel wall thickening or dilatation. The appendix is not definitely identified with no inflammatory changes in the right lower quadrant to suggest acute appendicitis. Vasculature/Lymphatics: Mild atherosclerotic plaque. No abdominal aorta or iliac aneurysm. No active contrast extravasation  or pseudoaneurysm. No abdominal, pelvic, inguinal lymphadenopathy. Reproductive: Normal. Other: No simple free fluid ascites. No pneumoperitoneum. No hemoperitoneum. No mesenteric hematoma identified. No organized fluid collection. Musculoskeletal: No significant soft tissue hematoma. Small fat containing left inguinal hernia. No acute pelvic fracture. Total right hip arthroplasty. No spinal fracture. Multilevel degenerative change of the spine. Appear L4 posterolateral and interbody surgical hardware fusion. Neural stimulator with the anterior to the sacrum. Other ports and devices: None. IMPRESSION: 1. No acute intrathoracic, intra-abdominal, intrapelvic traumatic injury. 2. No acute fracture or traumatic malalignment of the thoracic or lumbar spine. 3. Aneurysmal ascending thoracic aorta (4.1 cm). Recommend annual imaging followup by CTA or MRA. This recommendation follows 2010 ACCF/AHA/AATS/ACR/ASA/SCA/SCAI/SIR/STS/SVM Guidelines for the Diagnosis and Management of Patients with Thoracic Aortic Disease. Circulation. 2010; 121: Z733-z630. Aortic aneurysm NOS (ICD10-I71.9). Electronically Signed   By: Morgane  Naveau M.D.   On: 08/28/2024 00:24   CT HEAD WO CONTRAST Result Date: 08/28/2024 CLINICAL DATA:  Head trauma, moderate-severe; Polytrauma, blunt; Facial trauma, blunt. Fall EXAM: CT HEAD WITHOUT CONTRAST CT MAXILLOFACIAL WITHOUT CONTRAST CT CERVICAL SPINE WITHOUT CONTRAST TECHNIQUE: Multidetector CT imaging of the head, cervical spine, and maxillofacial structures were performed using the standard protocol without intravenous contrast. Multiplanar CT image reconstructions of the cervical spine and maxillofacial structures were also generated. RADIATION DOSE REDUCTION: This exam was performed according to the departmental dose-optimization program which includes automated exposure control, adjustment of the mA and/or kV according to patient size and/or use of iterative reconstruction technique. COMPARISON:   CT head 02/11/2014 FINDINGS: CT HEAD FINDINGS Brain: Trace patchy and confluent areas of decreased attenuation are noted throughout  the deep and periventricular white matter of the cerebral hemispheres bilaterally, compatible with chronic microvascular ischemic disease. No evidence of large-territorial acute infarction. No parenchymal hemorrhage. No mass lesion. No extra-axial collection. No mass effect or midline shift. No hydrocephalus. Basilar cisterns are patent. Vascular: No hyperdense vessel. Skull: No acute fracture or focal lesion. Other: None. CT MAXILLOFACIAL FINDINGS Osseous: No fracture or mandibular dislocation. No destructive process. Sinuses/Orbits: Paranasal sinuses and mastoid air cells are clear. The orbits are unremarkable. Soft tissues: Negative. CT CERVICAL SPINE FINDINGS Alignment: Grade 1 anterolisthesis of C4 on C5. Grade 1 anterolisthesis of C6 on C7. Skull base and vertebrae: Multilevel severe degenerative changes of the spine. No severe osseous neural foraminal or central canal stenosis. No acute fracture. No aggressive appearing focal osseous lesion or focal pathologic process. Soft tissues and spinal canal: No prevertebral fluid or swelling. No visible canal hematoma. Upper chest: Unremarkable. Other: Aneurysmal ascending thoracic aorta measuring up to 4.1 cm in caliber. IMPRESSION: 1. No acute intracranial abnormality. 2.  No acute displaced facial fracture. 3. No acute displaced fracture or traumatic listhesis of the cervical spine. 4. Aneurysmal ascending thoracic aorta (4.1 cm). Recommend annual imaging followup by CTA or MRA. This recommendation follows 2010 ACCF/AHA/AATS/ACR/ASA/SCA/SCAI/SIR/STS/SVM Guidelines for the Diagnosis and Management of Patients with Thoracic Aortic Disease. Circulation. 2010; 121: Z733-z630. Aortic aneurysm NOS (ICD10-I71.9). Electronically Signed   By: Morgane  Naveau M.D.   On: 08/28/2024 00:16   CT MAXILLOFACIAL WO CONTRAST Result Date:  08/28/2024 CLINICAL DATA:  Head trauma, moderate-severe; Polytrauma, blunt; Facial trauma, blunt. Fall EXAM: CT HEAD WITHOUT CONTRAST CT MAXILLOFACIAL WITHOUT CONTRAST CT CERVICAL SPINE WITHOUT CONTRAST TECHNIQUE: Multidetector CT imaging of the head, cervical spine, and maxillofacial structures were performed using the standard protocol without intravenous contrast. Multiplanar CT image reconstructions of the cervical spine and maxillofacial structures were also generated. RADIATION DOSE REDUCTION: This exam was performed according to the departmental dose-optimization program which includes automated exposure control, adjustment of the mA and/or kV according to patient size and/or use of iterative reconstruction technique. COMPARISON:  CT head 02/11/2014 FINDINGS: CT HEAD FINDINGS Brain: Trace patchy and confluent areas of decreased attenuation are noted throughout the deep and periventricular white matter of the cerebral hemispheres bilaterally, compatible with chronic microvascular ischemic disease. No evidence of large-territorial acute infarction. No parenchymal hemorrhage. No mass lesion. No extra-axial collection. No mass effect or midline shift. No hydrocephalus. Basilar cisterns are patent. Vascular: No hyperdense vessel. Skull: No acute fracture or focal lesion. Other: None. CT MAXILLOFACIAL FINDINGS Osseous: No fracture or mandibular dislocation. No destructive process. Sinuses/Orbits: Paranasal sinuses and mastoid air cells are clear. The orbits are unremarkable. Soft tissues: Negative. CT CERVICAL SPINE FINDINGS Alignment: Grade 1 anterolisthesis of C4 on C5. Grade 1 anterolisthesis of C6 on C7. Skull base and vertebrae: Multilevel severe degenerative changes of the spine. No severe osseous neural foraminal or central canal stenosis. No acute fracture. No aggressive appearing focal osseous lesion or focal pathologic process. Soft tissues and spinal canal: No prevertebral fluid or swelling. No visible  canal hematoma. Upper chest: Unremarkable. Other: Aneurysmal ascending thoracic aorta measuring up to 4.1 cm in caliber. IMPRESSION: 1. No acute intracranial abnormality. 2.  No acute displaced facial fracture. 3. No acute displaced fracture or traumatic listhesis of the cervical spine. 4. Aneurysmal ascending thoracic aorta (4.1 cm). Recommend annual imaging followup by CTA or MRA. This recommendation follows 2010 ACCF/AHA/AATS/ACR/ASA/SCA/SCAI/SIR/STS/SVM Guidelines for the Diagnosis and Management of Patients with Thoracic Aortic Disease. Circulation. 2010; 121: [id number]. Aortic  aneurysm NOS (ICD10-I71.9). Electronically Signed   By: Morgane  Naveau M.D.   On: 08/28/2024 00:16   CT CERVICAL SPINE WO CONTRAST Result Date: 08/28/2024 CLINICAL DATA:  Head trauma, moderate-severe; Polytrauma, blunt; Facial trauma, blunt. Fall EXAM: CT HEAD WITHOUT CONTRAST CT MAXILLOFACIAL WITHOUT CONTRAST CT CERVICAL SPINE WITHOUT CONTRAST TECHNIQUE: Multidetector CT imaging of the head, cervical spine, and maxillofacial structures were performed using the standard protocol without intravenous contrast. Multiplanar CT image reconstructions of the cervical spine and maxillofacial structures were also generated. RADIATION DOSE REDUCTION: This exam was performed according to the departmental dose-optimization program which includes automated exposure control, adjustment of the mA and/or kV according to patient size and/or use of iterative reconstruction technique. COMPARISON:  CT head 02/11/2014 FINDINGS: CT HEAD FINDINGS Brain: Trace patchy and confluent areas of decreased attenuation are noted throughout the deep and periventricular white matter of the cerebral hemispheres bilaterally, compatible with chronic microvascular ischemic disease. No evidence of large-territorial acute infarction. No parenchymal hemorrhage. No mass lesion. No extra-axial collection. No mass effect or midline shift. No hydrocephalus. Basilar cisterns are  patent. Vascular: No hyperdense vessel. Skull: No acute fracture or focal lesion. Other: None. CT MAXILLOFACIAL FINDINGS Osseous: No fracture or mandibular dislocation. No destructive process. Sinuses/Orbits: Paranasal sinuses and mastoid air cells are clear. The orbits are unremarkable. Soft tissues: Negative. CT CERVICAL SPINE FINDINGS Alignment: Grade 1 anterolisthesis of C4 on C5. Grade 1 anterolisthesis of C6 on C7. Skull base and vertebrae: Multilevel severe degenerative changes of the spine. No severe osseous neural foraminal or central canal stenosis. No acute fracture. No aggressive appearing focal osseous lesion or focal pathologic process. Soft tissues and spinal canal: No prevertebral fluid or swelling. No visible canal hematoma. Upper chest: Unremarkable. Other: Aneurysmal ascending thoracic aorta measuring up to 4.1 cm in caliber. IMPRESSION: 1. No acute intracranial abnormality. 2.  No acute displaced facial fracture. 3. No acute displaced fracture or traumatic listhesis of the cervical spine. 4. Aneurysmal ascending thoracic aorta (4.1 cm). Recommend annual imaging followup by CTA or MRA. This recommendation follows 2010 ACCF/AHA/AATS/ACR/ASA/SCA/SCAI/SIR/STS/SVM Guidelines for the Diagnosis and Management of Patients with Thoracic Aortic Disease. Circulation. 2010; 121: [id number]. Aortic aneurysm NOS (ICD10-I71.9). Electronically Signed   By: Morgane  Naveau M.D.   On: 08/28/2024 00:16   DG Chest Port 1 View Result Date: 08/27/2024 CLINICAL DATA:  Preoperative evaluation EXAM: PORTABLE CHEST 1 VIEW COMPARISON:  06/18/2023 FINDINGS: Cardiac shadow is within normal limits. Tortuous thoracic aorta is noted. Lungs are clear bilaterally. Bilateral shoulder replacements are seen. IMPRESSION: No acute abnormality noted. Electronically Signed   By: Oneil Devonshire M.D.   On: 08/27/2024 22:59   DG Knee 1-2 Views Right Result Date: 08/27/2024 CLINICAL DATA:  Recent fall with right knee pain, initial  encounter EXAM: RIGHT KNEE - 2 VIEW COMPARISON:  None Available. FINDINGS: Right knee prosthesis is noted. Immediately superior to the prosthesis there is a transverse fracture through the distal femoral metaphysis. The distal fracture fragment is posteriorly displaced with respect proximal femoral shaft. IMPRESSION: Transverse fracture just above the patient's right knee prosthesis with posterior displacement of the distal femoral fracture. Electronically Signed   By: Oneil Devonshire M.D.   On: 08/27/2024 22:59   DG Hip Unilat W or Wo Pelvis 2-3 Views Right Result Date: 08/27/2024 CLINICAL DATA:  Recent fall with right hip pain, initial encounter EXAM: DG HIP (WITH OR WITHOUT PELVIS) 3V RIGHT COMPARISON:  07/08/2019 FINDINGS: Pelvic ring is intact. Right hip replacement is noted. Right InterStim device  is seen. Postsurgical changes in the lower lumbar spine are noted. Mild degenerative changes of the left hip joint are seen. No acute fracture is noted. IMPRESSION: Chronic changes without acute abnormality. Electronically Signed   By: Oneil Devonshire M.D.   On: 08/27/2024 22:53     Medical Consultants:   None.   Subjective:    Kelly Taylor she relates her pain is not controlled, she is also complaining of wrist pain on the right  Objective:    Vitals:   08/28/24 0012 08/28/24 0030 08/28/24 0315 08/28/24 0327  BP: 126/75 112/76 (!) 147/93   Pulse: 97 95 (!) 107   Resp: 17 15 16    Temp: 98.3 F (36.8 C)   98.9 F (37.2 C)  TempSrc: Oral   Oral  SpO2: 94% 92% 96%   Weight:      Height:       SpO2: 96 %  No intake or output data in the 24 hours ending 08/28/24 0638 Filed Weights   08/27/24 2128  Weight: 127 kg    Exam: General exam: In no acute distress. Respiratory system: Good air movement and clear to auscultation. Cardiovascular system: S1 & S2 heard, RRR. No JVD. Gastrointestinal system: Abdomen is nondistended, soft and nontender.  Extremities: No pedal edema. Skin: No  rashes, lesions or ulcers Psychiatry: Judgement and insight appear normal. Mood & affect appropriate.    Data Reviewed:    Labs: Basic Metabolic Panel: Recent Labs  Lab 08/27/24 2300 08/27/24 2351  NA 141 141  K 3.8 3.8  CL 104 104  CO2  --  24  GLUCOSE 180* 171*  BUN 16 15  CREATININE 0.70 0.63  CALCIUM   --  9.6   GFR Estimated Creatinine Clearance: 90.7 mL/min (by C-G formula based on SCr of 0.63 mg/dL). Liver Function Tests: Recent Labs  Lab 08/27/24 2351  AST 18  ALT 16  ALKPHOS 87  BILITOT 0.3  PROT 6.6  ALBUMIN 3.9   No results for input(s): LIPASE, AMYLASE in the last 168 hours. No results for input(s): AMMONIA in the last 168 hours. Coagulation profile Recent Labs  Lab 08/27/24 2351  INR 0.9   COVID-19 Labs  No results for input(s): DDIMER, FERRITIN, LDH, CRP in the last 72 hours.  Lab Results  Component Value Date   SARSCOV2NAA NEGATIVE 09/29/2022    CBC: Recent Labs  Lab 08/27/24 2300 08/27/24 2351  WBC  --  13.7*  HGB 13.6 12.5  HCT 40.0 41.7  MCV  --  91.9  PLT  --  284   Cardiac Enzymes: No results for input(s): CKTOTAL, CKMB, CKMBINDEX, TROPONINI in the last 168 hours. BNP (last 3 results) No results for input(s): PROBNP in the last 8760 hours. CBG: No results for input(s): GLUCAP in the last 168 hours. D-Dimer: No results for input(s): DDIMER in the last 72 hours. Hgb A1c: No results for input(s): HGBA1C in the last 72 hours. Lipid Profile: No results for input(s): CHOL, HDL, LDLCALC, TRIG, CHOLHDL, LDLDIRECT in the last 72 hours. Thyroid  function studies: No results for input(s): TSH, T4TOTAL, T3FREE, THYROIDAB in the last 72 hours.  Invalid input(s): FREET3 Anemia work up: No results for input(s): VITAMINB12, FOLATE, FERRITIN, TIBC, IRON, RETICCTPCT in the last 72 hours. Sepsis Labs: Recent Labs  Lab 08/27/24 2301 08/27/24 2351  WBC  --  13.7*   LATICACIDVEN 3.0*  --    Microbiology No results found for this or any previous visit (from the past 240  hours).   Medications:    fesoterodine   4 mg Oral Daily   insulin  aspart  0-9 Units Subcutaneous Q4H   levothyroxine   75 mcg Oral QAC breakfast   polyethylene glycol  17 g Oral Daily   simvastatin   10 mg Oral QHS   Continuous Infusions:  sodium chloride         LOS: 0 days   Erle Odell Castor  [facility]  08/28/2024, 6:38 AM

## 2024-08-28 NOTE — Anesthesia Procedure Notes (Signed)
 Procedure Name: Intubation Date/Time: 08/28/2024 4:34 PM  Performed by: Cindie Donald CROME, CRNAPre-anesthesia Checklist: Patient identified, Emergency Drugs available, Suction available and Patient being monitored Patient Re-evaluated:Patient Re-evaluated prior to induction Oxygen Delivery Method: Circle System Utilized Preoxygenation: Pre-oxygenation with 100% oxygen Induction Type: IV induction Ventilation: Mask ventilation without difficulty Laryngoscope Size: Mac and 3 Grade View: Grade I Tube type: Oral Tube size: 7.0 mm Number of attempts: 1 Airway Equipment and Method: Stylet Placement Confirmation: ETT inserted through vocal cords under direct vision, positive ETCO2 and breath sounds checked- equal and bilateral Secured at: 21 cm Tube secured with: Tape Dental Injury: Teeth and Oropharynx as per pre-operative assessment

## 2024-08-28 NOTE — Anesthesia Preprocedure Evaluation (Addendum)
 Anesthesia Evaluation  Patient identified by MRN, date of birth, ID band Patient awake    Reviewed: Allergy & Precautions, NPO status , Patient's Chart, lab work & pertinent test results  History of Anesthesia Complications (+) POST - OP SPINAL HEADACHE and history of anesthetic complications  Airway Mallampati: II  TM Distance: >3 FB Neck ROM: Full    Dental  (+) Teeth Intact, Dental Advisory Given   Pulmonary shortness of breath, asthma    Pulmonary exam normal breath sounds clear to auscultation       Cardiovascular (-) angina + Peripheral Vascular Disease (Ascending aorta dilatation)  (-) Past MI Normal cardiovascular exam Rhythm:Regular Rate:Normal     Neuro/Psych  Headaches PSYCHIATRIC DISORDERS  Depression       GI/Hepatic Neg liver ROS,GERD  Medicated,,  Endo/Other  diabetes, Type 2, Oral Hypoglycemic AgentsHypothyroidism  Class 3 obesity  Renal/GU negative Renal ROS     Musculoskeletal  (+) Arthritis ,  Right distal femur fx   Abdominal  (+) + obese  Peds  Hematology negative hematology ROS (+)   Anesthesia Other Findings   Reproductive/Obstetrics                              Anesthesia Physical Anesthesia Plan  ASA: 3  Anesthesia Plan: General   Post-op Pain Management:    Induction: Intravenous  PONV Risk Score and Plan: 3 and Dexamethasone , Ondansetron , Midazolam  and Treatment may vary due to age or medical condition  Airway Management Planned: Oral ETT  Additional Equipment:   Intra-op Plan:   Post-operative Plan: Extubation in OR  Informed Consent: I have reviewed the patients History and Physical, chart, labs and discussed the procedure including the risks, benefits and alternatives for the proposed anesthesia with the patient or authorized representative who has indicated his/her understanding and acceptance.     Dental advisory given  Plan Discussed  with: CRNA  Anesthesia Plan Comments:         Anesthesia Quick Evaluation

## 2024-08-28 NOTE — Interval H&P Note (Signed)
 History and Physical Interval Note:  08/28/2024 3:53 PM  Kelly Taylor  has presented today for surgery, with the diagnosis of Right distal femur fracture.  The various methods of treatment have been discussed with the patient and family. After consideration of risks, benefits and other options for treatment, the patient has consented to  Procedure(s): OPEN REDUCTION INTERNAL FIXATION (ORIF) DISTAL FEMUR FRACTURE (Right) as a surgical intervention.  The patient's history has been reviewed, patient examined, no change in status, stable for surgery.  I have reviewed the patient's chart and labs.  Questions were answered to the patient's satisfaction.     Yuuki Skeens P Kyshon Tolliver

## 2024-08-28 NOTE — H&P (View-Only) (Signed)
 Orthopaedic Trauma Service (OTS) Consult   Patient ID: RAYETTE MOGG MRN: [id number] DOB/AGE: 1951/01/17 73 y.o.  Reason for Consult:Right supracondylar distal femur fracture Referring Physician: Dr. Marcey Her, MD Dareen  HPI: Kelly Taylor is an 73 y.o. female who is being seen in consultation at the request of Dr. Mariea for evaluation of right periprosthetic distal femur fracture.  Patient sustained a fall and fractured the above.  She had a total knee replacement with Dr. Hiram in 2014.  Due to complexity and OR availability I was asked to assist in manage primarily for definitive fixation.  Patient was seen and evaluated in the preoperative holding area.  Patient otherwise ambulates without assist device.  She is type II diabetic but does not take insulin  and only takes metformin .  Her last hemoglobin A1c was 6.3.  She lives at home with her brother.  No other injuries.  She does note some hand pain but x-rays were performed and are negative.  Past Medical History:  Diagnosis Date   Anemia    hx of in 20's   Aortic atherosclerosis    Arthritis    Ascending aorta dilatation    42mm by CT 10/23   Asthma    Back pain    Breast mass, left 2003   Chronic back pain    seeing Pain specialist   Complication of anesthesia    shallow breathing with general   Constipation    DDD (degenerative disc disease)    Depression    Diabetes mellitus    borderline   DJD (degenerative joint disease)    Family history of heart attack    GERD (gastroesophageal reflux disease)    H/O scarlet fever    Hemorrhoids    History of chicken pox    History of measles, mumps, or rubella as child   all 3   Hx of bladder infections    Hypothyroidism    Irritable bowel syndrome    Joint pain    Lumbar stenosis    RFA done May 2015   Menopausal symptoms 2003   Obesity, morbid (HCC) 2006   Pericarditis    hx of   Pneumonia    hx of 5 years ago   Polyp of colon    removed during  colonoscopy   SOBOE (shortness of breath on exertion)    Spinal headache    x1   Thyroid  condition    Trichomonas    Urge incontinence 2005   Vertigo    over a year was last episode   Yeast in stool     Past Surgical History:  Procedure Laterality Date   ABDOMINAL HYSTERECTOMY  02/18/1990   APPENDECTOMY  age 68   BLADDER SUSPENSION     BREAST LUMPECTOMY Left 11/20/2001   BREAST REDUCTION SURGERY  11/20/1990   CHOLECYSTECTOMY  11/20/2008   COLONOSCOPY  03/20/2014   4 polyps-benign   DILATION AND CURETTAGE OF UTERUS     FOOT SURGERY Right    pinched nerve   HAND SURGERY Right    right-Dr. Camella   HERNIA REPAIR Right 11/20/1990   INTERSTIM IMPLANT PLACEMENT Right 11/20/2001   for incontinence   KNEE SURGERY     bilateral knee; x3 right/ x2 left   REDUCTION MAMMAPLASTY     REVERSE SHOULDER ARTHROPLASTY Left 04/19/2017   Procedure: REVERSE SHOULDER ARTHROPLASTY;  Surgeon: Dozier Soulier, MD;  Location: [facility] OR;  Service: Orthopedics;  Laterality: Left;  REVERSE SHOULDER ARTHROPLASTY   REVERSE  SHOULDER ARTHROPLASTY Right 07/19/2023   Procedure: REVERSE SHOULDER ARTHROPLASTY;  Surgeon: Dozier Soulier, MD;  Location: [facility];  Service: Orthopedics;  Laterality: Right;   ROTATOR CUFF REPAIR  11/20/2006   right, almost complete reconstruction, bone anchors   TONSILLECTOMY  age 70   TOTAL HIP ARTHROPLASTY Right 07/30/2014   Procedure: RIGHT TOTAL HIP ARTHROPLASTY ANTERIOR APPROACH;  Surgeon: Lonni CINDERELLA Poli, MD;  Location: [facility];  Service: Orthopedics;  Laterality: Right;   TOTAL KNEE ARTHROPLASTY Right 04/07/2013   Procedure: RIGHT TOTAL KNEE ARTHROPLASTY;  Surgeon: Dempsey LULLA Moan, MD;  Location: [facility];  Service: Orthopedics;  Laterality: Right;   TOTAL KNEE ARTHROPLASTY Left 09/22/2013   Procedure: LEFT TOTAL KNEE ARTHROPLASTY;  Surgeon: Dempsey LULLA Moan, MD;  Location: [facility];  Service: Orthopedics;  Laterality: Left;    Family History  Problem Relation Age of  Onset   Cancer Mother    Hypertension Mother    Diabetes Mother    Lung cancer Mother    Heart disease Mother    Obesity Mother    Depression Father    Stroke Paternal Grandfather    Ovarian cancer Maternal Aunt    Breast cancer Maternal Aunt     Social History:  reports that she has never smoked. She has never used smokeless tobacco. She reports current alcohol use. She reports that she does not use drugs.  Allergies:  Allergies  Allergen Reactions   Oxycodone  Shortness Of Breath and Other (See Comments)    Turned purple and could not breathe- with an injection; Can take vicodin/morphine     Penicillins Anaphylaxis, Hives and Other (See Comments)    Pt was a baby when this happened Has patient had a PCN reaction causing immediate rash, facial/tongue/throat swelling, SOB or lightheadedness with hypotension: Yes Has patient had a PCN reaction causing severe rash involving mucus membranes or skin necrosis: Unknown Has patient had a PCN reaction that required hospitalization: Unknown Has patient had a PCN reaction occurring within the last 10 years: No    Trazodone Other (See Comments)    Nightmares   Lyrica [Pregabalin] Other (See Comments)    Caused nightmares   Nabumetone Nausea And Vomiting   Sulfa Antibiotics Other (See Comments)    Caused fevers    Medications:  No current facility-administered medications on file prior to encounter.   Current Outpatient Medications on File Prior to Encounter  Medication Sig Dispense Refill   albuterol  (PROVENTIL  HFA;VENTOLIN  HFA) 108 (90 BASE) MCG/ACT inhaler Inhale 2 puffs into the lungs every 4 (four) hours as needed for wheezing or shortness of breath. 1 Inhaler 0   aspirin  EC 81 MG tablet Take 81 mg by mouth daily. Swallow whole.     diclofenac sodium (VOLTAREN) 1 % GEL Apply 2 g topically 4 (four) times daily as needed (for pain).     docusate sodium  (COLACE) 100 MG capsule Take 200 mg by mouth every evening.     gabapentin  (NEURONTIN) 100 MG capsule Take 100 mg by mouth daily as needed.     hydrocortisone  (ANUSOL -HC) 2.5 % rectal cream Place 1 Application rectally 2 (two) times daily. (Patient taking differently: Place 1 Application rectally 2 (two) times daily as needed.) 30 g 0   ipratropium (ATROVENT) 0.06 % nasal spray Place 2 sprays into both nostrils 4 (four) times daily as needed (for seasonal allergies).     Lactulose  20 GM/30ML SOLN Take 15 mLs (10 g total) by mouth daily as needed (constipation). 450 mL 0  magnesium  oxide (MAG-OX) 400 (240 Mg) MG tablet Take 400 mg by mouth every evening.     metFORMIN  (GLUCOPHAGE -XR) 500 MG 24 hr tablet Take 500 mg by mouth in the morning and at bedtime.     Misc Natural Products (FLEX-A-MIN JOINT FLEX PO) Take 1 tablet by mouth in the morning. Instaflex Advanced Joint Support     Multiple Vitamins-Minerals (WOMENS MULTIVITAMIN) TABS Take 1 tablet by mouth daily with breakfast.     Omega-3 Fatty Acids (FISH OIL PO) Take 2 capsules by mouth daily after breakfast.     omeprazole  (PRILOSEC) 20 MG capsule Take 20 mg by mouth daily as needed.  1   polyethylene glycol (MIRALAX  / GLYCOLAX ) packet Take 17 g by mouth daily.     Probiotic Product (PROBIOTIC PO) Take 1 capsule by mouth every evening.     simvastatin  (ZOCOR ) 10 MG tablet Take 10 mg by mouth at bedtime.     solifenacin (VESICARE) 10 MG tablet Take 10 mg by mouth in the morning.     SYNTHROID  75 MCG tablet Take 75 mcg by mouth daily before breakfast.     traMADol  (ULTRAM ) 50 MG tablet Take 50 mg by mouth 3 (three) times daily as needed (for pain).       ROS: Constitutional: No fever or chills Vision: No changes in vision ENT: No difficulty swallowing CV: No chest pain Pulm: No SOB or wheezing GI: No nausea or vomiting GU: No urgency or inability to hold urine Skin: No poor wound healing Neurologic: No numbness or tingling Psychiatric: No depression or anxiety Heme: No bruising Allergic: No reaction to  medications or food   Exam: Blood pressure 115/76, pulse 100, temperature 98.6 F (37 C), temperature source Oral, resp. rate 19, height 5' 7 (1.702 m), weight 127 kg, SpO2 91%. General: No acute distress Orientation: Awake alert and oriented x 3 Mood and Affect: Cooperative and pleasant Gait: Unable to assess due to her fracture Coordination and balance: Within normal limits  Right lower extremity: Knee immobilizer is placed.  Is clean dry and intact.  Some swelling to the leg and deformity through the distal femur.  Compartments are soft compressible.  She has intact motor and sensory function distally.  She has 2+ DP and PT pulses.  Left lower extremity: Skin without lesions. No tenderness to palpation. Full painless ROM, full strength in each muscle groups without evidence of instability.   Medical Decision Making: Data: Imaging: X-rays were reviewed which shows a periprosthetic supracondylar distal femur fracture.  The total knee arthroplasty appears to be in good position  Labs:  Results for orders placed or performed during the hospital encounter of 08/27/24 (from the past 24 hours)  I-Stat Chem 8, ED     Status: Abnormal   Collection Time: 08/27/24 11:00 PM  Result Value Ref Range   Sodium 141 135 - 145 mmol/L   Potassium 3.8 3.5 - 5.1 mmol/L   Chloride 104 98 - 111 mmol/L   BUN 16 8 - 23 mg/dL   Creatinine, Ser 9.29 0.44 - 1.00 mg/dL   Glucose, Bld 819 (H) 70 - 99 mg/dL   Calcium , Ion 1.22 1.15 - 1.40 mmol/L   TCO2 24 22 - 32 mmol/L   Hemoglobin 13.6 12.0 - 15.0 g/dL   HCT 59.9 63.9 - 53.9 %  I-Stat Lactic Acid, ED     Status: Abnormal   Collection Time: 08/27/24 11:01 PM  Result Value Ref Range   Lactic Acid, Venous 3.0 (  HH) 0.5 - 1.9 mmol/L   Comment NOTIFIED PHYSICIAN   Comprehensive metabolic panel     Status: Abnormal   Collection Time: 08/27/24 11:51 PM  Result Value Ref Range   Sodium 141 135 - 145 mmol/L   Potassium 3.8 3.5 - 5.1 mmol/L   Chloride 104  98 - 111 mmol/L   CO2 24 22 - 32 mmol/L   Glucose, Bld 171 (H) 70 - 99 mg/dL   BUN 15 8 - 23 mg/dL   Creatinine, Ser 9.36 0.44 - 1.00 mg/dL   Calcium  9.6 8.9 - 10.3 mg/dL   Total Protein 6.6 6.5 - 8.1 g/dL   Albumin 3.9 3.5 - 5.0 g/dL   AST 18 15 - 41 U/L   ALT 16 0 - 44 U/L   Alkaline Phosphatase 87 38 - 126 U/L   Total Bilirubin 0.3 0.0 - 1.2 mg/dL   GFR, Estimated >39 >39 mL/min   Anion gap 13 5 - 15  CBC     Status: Abnormal   Collection Time: 08/27/24 11:51 PM  Result Value Ref Range   WBC 13.7 (H) 4.0 - 10.5 K/uL   RBC 4.54 3.87 - 5.11 MIL/uL   Hemoglobin 12.5 12.0 - 15.0 g/dL   HCT 58.2 63.9 - 53.9 %   MCV 91.9 80.0 - 100.0 fL   MCH 27.5 26.0 - 34.0 pg   MCHC 30.0 30.0 - 36.0 g/dL   RDW 86.2 88.4 - 84.4 %   Platelets 284 150 - 400 K/uL   nRBC 0.0 0.0 - 0.2 %  Ethanol     Status: Abnormal   Collection Time: 08/27/24 11:51 PM  Result Value Ref Range   Alcohol, Ethyl (B) 31 (H) <15 mg/dL  Protime-INR     Status: None   Collection Time: 08/27/24 11:51 PM  Result Value Ref Range   Prothrombin Time 12.9 11.4 - 15.2 seconds   INR 0.9 0.8 - 1.2  Urinalysis, Routine w reflex microscopic -Urine, Clean Catch     Status: Abnormal   Collection Time: 08/28/24 12:50 AM  Result Value Ref Range   Color, Urine YELLOW YELLOW   APPearance CLEAR CLEAR   Specific Gravity, Urine >1.046 (H) 1.005 - 1.030   pH 5.0 5.0 - 8.0   Glucose, UA NEGATIVE NEGATIVE mg/dL   Hgb urine dipstick NEGATIVE NEGATIVE   Bilirubin Urine NEGATIVE NEGATIVE   Ketones, ur 5 (A) NEGATIVE mg/dL   Protein, ur NEGATIVE NEGATIVE mg/dL   Nitrite POSITIVE (A) NEGATIVE   Leukocytes,Ua NEGATIVE NEGATIVE   RBC / HPF 0-5 0 - 5 RBC/hpf   WBC, UA 0-5 0 - 5 WBC/hpf   Bacteria, UA MANY (A) NONE SEEN   Squamous Epithelial / HPF 0-5 0 - 5 /HPF  Lactic acid, plasma     Status: Abnormal   Collection Time: 08/28/24  6:35 AM  Result Value Ref Range   Lactic Acid, Venous 3.3 (HH) 0.5 - 1.9 mmol/L  Hemoglobin A1c      Status: Abnormal   Collection Time: 08/28/24  6:35 AM  Result Value Ref Range   Hgb A1c MFr Bld 6.8 (H) 4.8 - 5.6 %   Mean Plasma Glucose 148.46 mg/dL  CBC     Status: Abnormal   Collection Time: 08/28/24  6:35 AM  Result Value Ref Range   WBC 14.7 (H) 4.0 - 10.5 K/uL   RBC 4.43 3.87 - 5.11 MIL/uL   Hemoglobin 12.7 12.0 - 15.0 g/dL   HCT 59.3 63.9 -  46.0 %   MCV 91.6 80.0 - 100.0 fL   MCH 28.7 26.0 - 34.0 pg   MCHC 31.3 30.0 - 36.0 g/dL   RDW 86.2 88.4 - 84.4 %   Platelets 247 150 - 400 K/uL   nRBC 0.0 0.0 - 0.2 %  Procalcitonin     Status: None   Collection Time: 08/28/24  7:00 AM  Result Value Ref Range   Procalcitonin <0.10 ng/mL  CBG monitoring, ED     Status: Abnormal   Collection Time: 08/28/24  8:54 AM  Result Value Ref Range   Glucose-Capillary 153 (H) 70 - 99 mg/dL  Glucose, capillary     Status: Abnormal   Collection Time: 08/28/24  2:34 PM  Result Value Ref Range   Glucose-Capillary 133 (H) 70 - 99 mg/dL     Imaging or Labs ordered: None  Medical history and chart was reviewed and case discussed with medical provider.  Assessment/Plan: 73 year old female with a right periprosthetic supracondylar still femur fracture.  Due to the unstable nature of her injury I recommend proceeding with open reduction internal fixation.  Risks and benefits were discussed with the patient.  Risks include but not limited to bleeding, infection, malunion, nonunion, hardware failure, hardware irritation, nerve or blood vessel injury, knee stiffness, DVT, and the possibility anesthetic complications.  She agreed to proceed with surgery and consent was obtained.  Franky MYRTIS Light, MD [facility] [phone] (office) [email or web address]

## 2024-08-28 NOTE — Op Note (Signed)
 Orthopaedic Surgery Operative Note (CSN: [id number] ) Date of Surgery: 08/28/2024  Admit Date: 08/27/2024   Diagnoses: Pre-Op Diagnoses: Right periprosthetic supracondylar distal femur fracture  Post-Op Diagnosis: Same  Procedures: CPT 27511-Open reduction internal fixation of right supracondylar distal femur fracture  Surgeons : Primary: Kendal Franky SQUIBB, MD  Assistant: Lauraine Moores, PA-C  Location: OR 3   Anesthesia: General   Antibiotics: Vancomycin  1 gm IV and 1 gm place topically   Tourniquet time: None    Estimated Blood Loss: 75 mL  Complications: None   Specimens:* No specimens in log *   Implants: Implant Name Type Inv. Item Serial No. Manufacturer Lot No. LRB No. Used Action  CAP LOCK NCB - [id number] Cap CAP LOCK NCB  [employer](ORTH,TRAU,BIO,SG)  Right 7 Implanted  PLATE DISTAL FEMUR 15H 317M RT - [id number] Plate PLATE DISTAL FEMUR 15H 317M RT  [employer](ORTH,TRAU,BIO,SG)  Right 1 Implanted  SCREW NCB 5.0X38 - [id number] Screw SCREW NCB 5.0X38  [employer](ORTH,TRAU,BIO,SG)  Right 1 Implanted  SCREW CORTICAL NCB 5.0X40 - [id number] Screw SCREW CORTICAL NCB 5.0X40  [employer](ORTH,TRAU,BIO,SG)  Right 1 Implanted  SCREW 5.0 - [id number] Screw SCREW 5.0  [employer](ORTH,TRAU,BIO,SG)  Right 3 Implanted  SCREW NCB 6.4K24K4K3.2XST - [id number] Screw SCREW NCB [id number]  [employer](ORTH,TRAU,BIO,SG)  Right 2 Implanted  SCREW UNI 5.0 - [id number] Screw SCREW UNI 5.0  [employer](ORTH,TRAU,BIO,SG)  Right 1 Implanted  SCREW UNICORTICAL 5.0X14 - [id number] Screw SCREW UNICORTICAL 5.0X14  [employer](ORTH,TRAU,BIO,SG)  Right 1 Implanted  SCREW NCB 4.0MX38M - [id number] Screw SCREW NCB 4.0MX38M  [employer](ORTH,TRAU,BIO,SG)  Right 1 Implanted     Indications for Surgery: 73 year old female who sustained a ground-level fall with a right supracondylar femur fracture.  Due to the unstable nature of her injury I recommend proceeding  with open reduction internal fixation.  Risks and benefits were discussed with the patient.  Risks included but not limited to bleeding, infection, malunion, nonunion, hardware failure, hardware irritation, nerve and blood vessel injury, DVT, even possibility anesthetic complications.  She agreed to proceed with surgery and consent was obtained.  Operative Findings: 1.  Open reduction internal fixation of right supracondylar distal femur fracture using [employer] NCB distal femoral locking plate.  Procedure: The patient was identified in the preoperative holding area. Consent was confirmed with the patient and their family and all questions were answered. The operative extremity was marked after confirmation with the patient. she was then brought back to the operating room by our anesthesia colleagues.  She was placed under general anesthetic and carefully transferred over to a radiolucent flattop table.  A bump was placed under her operative hip.  The right lower extremity was then prepped and draped in usual sterile fashion.  A timeout was performed to verify the patient, the procedure, and the extremity.  Preoperative antibiotics were dosed.  The hip and knee were flexed over a triangle and fluoroscopic imaging showed the unstable nature of her injury.  Traction was applied and the fracture was aligned appropriately.  Lateral approach to the distal femur was made and carried down through skin and subcutaneous tissue.  I incised through the IT band and expose the lateral condyle of the femur.  The shaft of the femur was translated anteriorly so I was able to use a Cobb elevator to manipulate the fracture back into reduction I held it provisionally with a 2.0 mm guidewire.  I then chose a 15 hole [employer] NCB distal femoral  locking plate.  I slid this submuscularly along the lateral cortex of the femur.  I held it provisionally distally with a 2.0 mm guidewire.  I then placed a 3.3 mm drill bit  through the targeting arm in the shaft of the femur below the total hip prosthesis.  I then proceeded to place 5.0 millimeter screws distally to bring the plate flush to bone.  I then percutaneously placed 5.0 millimeter screws in the femoral shaft to correct the coronal alignment and bring the shaft to the plate.  I remove the 3.3 mm drill bit and placed a 4.0 millimeter screw.  I then placed a unicortical screws in the shaft of the femur around the hip prosthesis.  Locking caps were placed on the middle 5.0 millimeter screw in the 4.0 millimeter screw.  I returned to the distal segment and placed a total of five 5.0 millimeter screws.  Locking caps were placed in all of the distal screws.  Final fluoroscopic imaging was obtained.  The incisions were irrigated.  A gram of vancomycin  powder was placed into the incision.  Layered closure of 0 Vicryl, 2-0 Monocryl, 3-0 Monocryl and Dermabond was used to close the skin.  Sterile dressings were applied.  The patient was then awoken from anesthesia and taken to the PACU in stable condition.   Post Op Plan/Instructions: The patient be weightbearing as tolerated to the right lower extremity.  She will receive postoperative Ancef .  She will receive Lovenox for DVT prophylaxis and discharged on an oral DOAC.  Will have her mobilize with physical and Occupational Therapy.  I was present and performed the entire surgery.  Lauraine Moores, PA-C did assist me throughout the case. An assistant was necessary given the difficulty in approach, maintenance of reduction and ability to instrument the fracture.   Franky Light, MD [facility]

## 2024-08-28 NOTE — Transfer of Care (Signed)
 Immediate Anesthesia Transfer of Care Note  Patient: Kelly Taylor  Procedure(s) Performed: OPEN REDUCTION INTERNAL FIXATION (ORIF) DISTAL FEMUR FRACTURE, RIGHT (Right)  Patient Location: PACU  Anesthesia Type:General  Level of Consciousness: awake, alert , and oriented  Airway & Oxygen Therapy: Patient connected to nasal cannula oxygen  Post-op Assessment: Report given to RN and Post -op Vital signs reviewed and stable  Post vital signs: Reviewed and stable  Last Vitals:  Vitals Value Taken Time  BP 124/78 08/28/24 18:01  Temp    Pulse 93 08/28/24 18:04  Resp 18 08/28/24 18:04  SpO2 94 % 08/28/24 18:04  Vitals shown include unfiled device data.  Last Pain:  Vitals:   08/28/24 1424  TempSrc: Oral  PainSc: 8          Complications: There were no known notable events for this encounter.

## 2024-08-29 ENCOUNTER — Encounter (HOSPITAL_COMMUNITY): Payer: Self-pay | Admitting: Student

## 2024-08-29 DIAGNOSIS — M978XXA Periprosthetic fracture around other internal prosthetic joint, initial encounter: Secondary | ICD-10-CM | POA: Diagnosis not present

## 2024-08-29 DIAGNOSIS — Z96649 Presence of unspecified artificial hip joint: Secondary | ICD-10-CM | POA: Diagnosis not present

## 2024-08-29 LAB — BASIC METABOLIC PANEL WITH GFR
Anion gap: 9 (ref 5–15)
BUN: 10 mg/dL (ref 8–23)
CO2: 28 mmol/L (ref 22–32)
Calcium: 8.7 mg/dL — ABNORMAL LOW (ref 8.9–10.3)
Chloride: 100 mmol/L (ref 98–111)
Creatinine, Ser: 0.71 mg/dL (ref 0.44–1.00)
GFR, Estimated: >60 mL/min (ref >60)
Glucose, Bld: 151 mg/dL — ABNORMAL HIGH (ref 70–99)
Potassium: 4.2 mmol/L (ref 3.5–5.1)
Sodium: 137 mmol/L (ref 135–145)

## 2024-08-29 LAB — CBC
HCT: 35.2 % — ABNORMAL LOW (ref 36.0–46.0)
Hemoglobin: 11.2 g/dL — ABNORMAL LOW (ref 12.0–15.0)
MCH: 28.9 pg (ref 26.0–34.0)
MCHC: 31.8 g/dL (ref 30.0–36.0)
MCV: 91 fL (ref 80.0–100.0)
Platelets: 229 K/uL (ref 150–400)
RBC: 3.87 MIL/uL (ref 3.87–5.11)
RDW: 13.8 % (ref 11.5–15.5)
WBC: 10.2 K/uL (ref 4.0–10.5)
nRBC: 0 % (ref 0.0–0.2)

## 2024-08-29 LAB — GLUCOSE, CAPILLARY
Glucose-Capillary: 144 mg/dL — ABNORMAL HIGH (ref 70–99)
Glucose-Capillary: 148 mg/dL — ABNORMAL HIGH (ref 70–99)
Glucose-Capillary: 152 mg/dL — ABNORMAL HIGH (ref 70–99)
Glucose-Capillary: 156 mg/dL — ABNORMAL HIGH (ref 70–99)
Glucose-Capillary: 157 mg/dL — ABNORMAL HIGH (ref 70–99)
Glucose-Capillary: 187 mg/dL — ABNORMAL HIGH (ref 70–99)

## 2024-08-29 LAB — VITAMIN D 25 HYDROXY (VIT D DEFICIENCY, FRACTURES): Vit D, 25-Hydroxy: 32.42 ng/mL (ref 30–100)

## 2024-08-29 LAB — HIV ANTIBODY (ROUTINE TESTING W REFLEX): HIV Screen 4th Generation wRfx: NONREACTIVE

## 2024-08-29 MED ORDER — HYDROCODONE-ACETAMINOPHEN 7.5-325 MG PO TABS
1.0000 | ORAL_TABLET | ORAL | Status: DC | PRN
  Administered 2024-08-30: 2 via ORAL
  Filled 2024-08-29: qty 2

## 2024-08-29 MED ORDER — MAGNESIUM CITRATE PO SOLN: 1.0000 | Freq: Once | ORAL | Status: DC | PRN

## 2024-08-29 MED ORDER — METHOCARBAMOL 1000 MG/10ML IJ SOLN: 500.0000 mg | Freq: Four times a day (QID) | INTRAMUSCULAR | Status: DC | PRN

## 2024-08-29 MED ORDER — METOCLOPRAMIDE HCL 5 MG/ML IJ SOLN: 5.0000 mg | Freq: Three times a day (TID) | INTRAMUSCULAR | Status: DC | PRN

## 2024-08-29 MED ORDER — ENOXAPARIN SODIUM 40 MG/0.4ML IJ SOSY
40.0000 mg | PREFILLED_SYRINGE | INTRAMUSCULAR | Status: DC
  Administered 2024-08-29 – 2024-08-30 (×2): 40 mg via SUBCUTANEOUS
  Filled 2024-08-29 (×2): qty 0.4

## 2024-08-29 MED ORDER — METOCLOPRAMIDE HCL 5 MG PO TABS: 5.0000 mg | ORAL_TABLET | Freq: Three times a day (TID) | ORAL | Status: DC | PRN

## 2024-08-29 MED ORDER — MORPHINE SULFATE (PF) 2 MG/ML IV SOLN: 0.5000 mg | INTRAVENOUS | Status: DC | PRN

## 2024-08-29 MED ORDER — VANCOMYCIN HCL IN DEXTROSE 1-5 GM/200ML-% IV SOLN
1000.0000 mg | Freq: Two times a day (BID) | INTRAVENOUS | Status: AC
  Administered 2024-08-29: 1000 mg via INTRAVENOUS
  Filled 2024-08-29: qty 200

## 2024-08-29 MED ORDER — ONDANSETRON HCL 4 MG PO TABS: 4.0000 mg | ORAL_TABLET | Freq: Four times a day (QID) | ORAL | Status: DC | PRN

## 2024-08-29 MED ORDER — ACETAMINOPHEN 325 MG PO TABS: 325.0000 mg | ORAL_TABLET | Freq: Four times a day (QID) | ORAL | Status: DC | PRN

## 2024-08-29 MED ORDER — DIPHENHYDRAMINE HCL 12.5 MG/5ML PO ELIX: 12.5000 mg | ORAL_SOLUTION | ORAL | Status: DC | PRN

## 2024-08-29 MED ORDER — ONDANSETRON HCL 4 MG/2ML IJ SOLN
4.0000 mg | Freq: Four times a day (QID) | INTRAMUSCULAR | Status: DC | PRN
  Administered 2024-09-01: 4 mg via INTRAVENOUS
  Filled 2024-08-29: qty 2

## 2024-08-29 MED ORDER — POLYETHYLENE GLYCOL 3350 17 G PO PACK
17.0000 g | PACK | Freq: Every day | ORAL | Status: DC | PRN
  Administered 2024-08-30 – 2024-08-31 (×2): 17 g via ORAL
  Filled 2024-08-29 (×3): qty 1

## 2024-08-29 MED ORDER — METHOCARBAMOL 500 MG PO TABS: 500.0000 mg | ORAL_TABLET | Freq: Four times a day (QID) | ORAL | Status: DC | PRN

## 2024-08-29 MED ORDER — DOCUSATE SODIUM 100 MG PO CAPS
100.0000 mg | ORAL_CAPSULE | Freq: Two times a day (BID) | ORAL | Status: DC
  Administered 2024-08-29 – 2024-09-02 (×10): 100 mg via ORAL
  Filled 2024-08-29 (×9): qty 1

## 2024-08-29 MED ORDER — BISACODYL 5 MG PO TBEC
5.0000 mg | DELAYED_RELEASE_TABLET | Freq: Every day | ORAL | Status: DC | PRN
  Administered 2024-08-29 – 2024-09-01 (×4): 5 mg via ORAL
  Filled 2024-08-29 (×4): qty 1

## 2024-08-29 MED ORDER — HYDROCODONE-ACETAMINOPHEN 5-325 MG PO TABS
1.0000 | ORAL_TABLET | ORAL | Status: DC | PRN
  Administered 2024-08-29 – 2024-09-02 (×9): 2 via ORAL
  Filled 2024-08-29 (×9): qty 2

## 2024-08-29 NOTE — Progress Notes (Signed)
 Orthopedic Tech Progress Note Patient Details:  GIAH FICKETT [birth year] [id number]  Ortho Devices Type of Ortho Device: Thumb velcro splint Ortho Device/Splint Location: RUE Ortho Device/Splint Interventions: Ordered   Post Interventions Patient Tolerated: Well Instructions Provided: Adjustment of device, Care of device  Adine MARLA Blush 08/29/2024, 1:24 PM

## 2024-08-29 NOTE — Anesthesia Postprocedure Evaluation (Signed)
 Anesthesia Post Note  Patient: Kelly Taylor  Procedure(s) Performed: OPEN REDUCTION INTERNAL FIXATION (ORIF) DISTAL FEMUR FRACTURE, RIGHT (Right)     Patient location during evaluation: PACU Anesthesia Type: General Level of consciousness: awake and alert Pain management: pain level controlled Vital Signs Assessment: post-procedure vital signs reviewed and stable Respiratory status: spontaneous breathing, nonlabored ventilation and respiratory function stable Cardiovascular status: stable and blood pressure returned to baseline Anesthetic complications: no   There were no known notable events for this encounter.  Last Vitals:  Vitals:   08/29/24 0352 08/29/24 1010  BP: 115/84 106/69  Pulse: 92 95  Resp: 18 16  Temp: 36.8 C 37.4 C  SpO2: 93% 95%    Last Pain:  Vitals:   08/29/24 1010  TempSrc: Oral  PainSc:                  Debby FORBES Like

## 2024-08-29 NOTE — Evaluation (Signed)
 Physical Therapy Evaluation Patient Details Name: Kelly Taylor MRN: [id number] DOB: [birth year] Today's Date: 08/29/2024  History of Present Illness  Pt is a 73 y.o. female admitted 08/27/24 after ground level fall, sustaining right periprosthetic supracondylar distal femur fracture. Pt s/p ORIF 10/9. PMHx: T2DM, severe OSA on CPAP, mild obstructive airway disease, obesity, chronic back pain, anemia, ascending aorta dilation, depression, GERD, hypothyroidism, IBS, vertigo, HLD, and hx of B TKA.   Clinical Impression  Pt admitted with above diagnosis. PTA, pt was independent with functional mobility, ADLs/IADLs, and driving. She lives with her brother in a two story house with 1 STE. Pt is able to reside on the main floor. Pt currently with functional limitations due to the deficits listed below (see PT Problem List). She required modA x2 for bed mobility, modA x2 for sit<>stand using RW, and minA x2 for bed>chair transfer using RW. She is currently limited by pain, impaired balance, and decreased activity tolerance. Pt will benefit from acute skilled PT to increase her independence and safety with mobility to allow discharge. Recommend continued inpatient follow up therapy, <3 hours/day.    If plan is discharge home, recommend the following: Two people to help with walking and/or transfers;Two people to help with bathing/dressing/bathroom;Assistance with cooking/housework;Assist for transportation;Help with stairs or ramp for entrance   Can travel by private vehicle   No    Equipment Recommendations Hoyer lift;Wheelchair (measurements PT);Wheelchair cushion (measurements PT)  Recommendations for Other Services       Functional Status Assessment Patient has had a recent decline in their functional status and demonstrates the ability to make significant improvements in function in a reasonable and predictable amount of time.     Precautions / Restrictions Precautions Precautions:  Fall Recall of Precautions/Restrictions: Intact Restrictions Weight Bearing Restrictions Per Provider Order: Yes RLE Weight Bearing Per Provider Order: Weight bearing as tolerated      Mobility  Bed Mobility Overal bed mobility: Needs Assistance Bed Mobility: Supine to Sit     Supine to sit: HOB elevated, Used rails, Mod assist, +2 for physical assistance     General bed mobility comments: Pt sat up on L side of bed with increased time. Cues for sequencing. HOB elevated ~35deg. Assist to manage RLE, elevate trunk, and scoot hips fwd with use of bed pad.    Transfers Overall transfer level: Needs assistance Equipment used: Rolling walker (2 wheels) Transfers: Sit to/from Stand, Bed to chair/wheelchair/BSC Sit to Stand: From elevated surface, Mod assist, +2 physical assistance   Step pivot transfers: Min assist, +2 physical assistance       General transfer comment: Pt stood from raised bed height. Cued proper hand placement. Assisted pt with RLE positioning. She powered up with modA x2 and aid of bed pad to faciliate increased hip flex. Pt took increased time to achieve upright posture and accept weight on RLE. She transferred to recliner chair positioned on her left. Good eccentric control.    Ambulation/Gait Ambulation/Gait assistance: Min assist, +2 physical assistance Gait Distance (Feet): 3 Feet Assistive device: Rolling walker (2 wheels) Gait Pattern/deviations: Step-to pattern, Decreased step length - right, Decreased step length - left, Decreased stride length, Decreased stance time - right, Decreased weight shift to right Gait velocity: decreased     General Gait Details: Pt ambulated with short, slow, laborious steps in order to transfer to recliner chair on her left. Heavy reliance on BUE support on RW. Cues for sequencing. Assist to manuever RW.  Stairs  Wheelchair Mobility     Tilt Bed    Modified Rankin (Stroke Patients Only)        Balance Overall balance assessment: Needs assistance Sitting-balance support: Bilateral upper extremity supported, Feet supported, No upper extremity supported Sitting balance-Leahy Scale: Fair Sitting balance - Comments: Pt sat EOB with close supervision. She was able to reach down the front of her legs and back without LOB.   Standing balance support: Bilateral upper extremity supported, During functional activity, Reliant on assistive device for balance Standing balance-Leahy Scale: Poor Standing balance comment: Pt dependent on RW                             Pertinent Vitals/Pain Pain Assessment Pain Assessment: 0-10 Pain Score: 7  Pain Location: R Knee Pain Descriptors / Indicators: Operative site guarding, Grimacing, Discomfort Pain Intervention(s): Premedicated before session, Monitored during session, Limited activity within patient's tolerance, Repositioned    Home Living Family/patient expects to be discharged to:: Private residence Living Arrangements: Other relatives (Brother) Available Help at Discharge: Family;Available PRN/intermittently Type of Home: House Home Access: Stairs to enter Entrance Stairs-Rails: None Entrance Stairs-Number of Steps: 1   Home Layout: Two level;Able to live on main level with bedroom/bathroom (Basement) Home Equipment: Shower seat;Grab bars - toilet;Grab bars - tub/shower;Cane - single Librarian, academic (2 wheels);Rollator (4 wheels)      Prior Function Prior Level of Function : Independent/Modified Independent;Driving;History of Falls (last six months)             Mobility Comments: Ambulates without AD. 1 fall leading to current admission. ADLs Comments: Indep with ADLs/IADLs. Retired.     Extremity/Trunk Assessment   Upper Extremity Assessment Upper Extremity Assessment: Defer to OT evaluation    Lower Extremity Assessment Lower Extremity Assessment: RLE deficits/detail RLE Deficits / Details: Pt POD 1  s/p femur ORIF. She is in a ace bandage/dressing from mid-thigh to foot. Decreased hip and knee AROM. Limited PROM with pt actively resisting. Ankle WFL. Grossly 2/5 strength. RLE: Unable to fully assess due to pain RLE Sensation: WNL RLE Coordination: decreased gross motor    Cervical / Trunk Assessment Cervical / Trunk Assessment: Other exceptions Cervical / Trunk Exceptions: Increased Body Habitus  Communication   Communication Communication: No apparent difficulties    Cognition Arousal: Alert Behavior During Therapy: WFL for tasks assessed/performed   PT - Cognitive impairments: No apparent impairments                       PT - Cognition Comments: Pt A,Ox4 Following commands: Intact       Cueing Cueing Techniques: Verbal cues, Gestural cues     General Comments General comments (skin integrity, edema, etc.): VSS on RA    Exercises Other Exercises Other Exercises: Incentive Spirometer. Educated pt on proper use and correct technique. Instructed her to complete 10 reps every hour.   Assessment/Plan    PT Assessment Patient needs continued PT services  PT Problem List Decreased strength;Decreased range of motion;Decreased activity tolerance;Decreased balance;Decreased mobility;Decreased knowledge of use of DME;Decreased safety awareness;Pain       PT Treatment Interventions DME instruction;Gait training;Functional mobility training;Therapeutic activities;Therapeutic exercise;Stair training;Balance training;Patient/family education;Wheelchair mobility training    PT Goals (Current goals can be found in the Care Plan section)  Acute Rehab PT Goals Patient Stated Goal: Regain independence PT Goal Formulation: With patient Time For Goal Achievement: 09/12/24 Potential to Achieve Goals: Good  Frequency Min 2X/week     Co-evaluation PT/OT/SLP Co-Evaluation/Treatment: Yes Reason for Co-Treatment: For patient/therapist safety;To address functional/ADL  transfers PT goals addressed during session: Mobility/safety with mobility;Balance;Proper use of DME         AM-PAC PT 6 Clicks Mobility  Outcome Measure Help needed turning from your back to your side while in a flat bed without using bedrails?: Total Help needed moving from lying on your back to sitting on the side of a flat bed without using bedrails?: Total Help needed moving to and from a bed to a chair (including a wheelchair)?: Total Help needed standing up from a chair using your arms (e.g., wheelchair or bedside chair)?: Total Help needed to walk in hospital room?: Total Help needed climbing 3-5 steps with a railing? : Total 6 Click Score: 6    End of Session Equipment Utilized During Treatment: Gait belt Activity Tolerance: Patient tolerated treatment well Patient left: in chair;with call bell/phone within reach;with chair alarm set Nurse Communication: Mobility status;Need for lift equipment (stedy) PT Visit Diagnosis: Difficulty in walking, not elsewhere classified (R26.2);Pain;Other abnormalities of gait and mobility (R26.89) Pain - Right/Left: Right Pain - part of body: Hip;Knee;Leg    Time: 1055-1130 PT Time Calculation (min) (ACUTE ONLY): 35 min   Charges:   PT Evaluation $PT Eval Moderate Complexity: 1 Mod   PT General Charges $$ ACUTE PT VISIT: 1 Visit         Randall SAUNDERS, PT, DPT Acute Rehabilitation Services Office: [phone] Secure Chat Preferred  Delon CHRISTELLA Callander 08/29/2024, 1:23 PM

## 2024-08-29 NOTE — Evaluation (Addendum)
 Occupational Therapy Evaluation Patient Details Name: Kelly Taylor MRN: [id number] DOB: Apr 12, 1951 Today's Date: 08/29/2024   History of Present Illness   Pt is a 73 y.o. female admitted 08/27/24 after ground level fall, sustaining right periprosthetic supracondylar distal femur fracture. Pt s/p ORIF 10/9. PMHx: T2DM, severe OSA on CPAP, mild obstructive airway disease, obesity, chronic back pain, anemia, ascending aorta dilation, depression, GERD, hypothyroidism, IBS, vertigo, HLD, and hx of B TKA.     Clinical Impressions Pt presents with decline in function and safety with ADLs and ADL mobility with impaired strength, balance and endurance. PTA pt was Ind with ADLs/IADLs, mobility, home mgt and driving. Pt lives with her brother in a two story house with 1 STE. Pt is able to live on the main floor. Pt currently requires mod A + 2 for bed mobility, max -total A with LB ADLs, total A with toileting and mod A + 2 for STS using RW, and min A + 2 for bed to chair transfer using RW. Pt is limited by pain in R knee and R wrist, asked MD for R wrist splint. Pt motivated to work with therapy. OT will follow acutely to maximize level of function and safety      If plan is discharge home, recommend the following:   A lot of help with bathing/dressing/bathroom;Two people to help with walking and/or transfers;Assistance with cooking/housework;Assist for transportation;Help with stairs or ramp for entrance     Functional Status Assessment   Patient has had a recent decline in their functional status and demonstrates the ability to make significant improvements in function in a reasonable and predictable amount of time.     Equipment Recommendations   Other (comment) (defer)     Recommendations for Other Services         Precautions/Restrictions   Precautions Precautions: Fall Recall of Precautions/Restrictions: Intact Restrictions Weight Bearing Restrictions Per Provider Order:  Yes RLE Weight Bearing Per Provider Order: Weight bearing as tolerated     Mobility Bed Mobility Overal bed mobility: Needs Assistance Bed Mobility: Supine to Sit     Supine to sit: HOB elevated, Used rails, Mod assist, +2 for physical assistance     General bed mobility comments: Pt sat up on L side of bed with increased time. Cues for sequencing. HOB elevated ~35deg. Assist to manage RLE, elevate trunk, and scoot hips fwd with use of bed pad.    Transfers Overall transfer level: Needs assistance Equipment used: Rolling walker (2 wheels) Transfers: Sit to/from Stand, Bed to chair/wheelchair/BSC Sit to Stand: From elevated surface, Mod assist, +2 physical assistance     Step pivot transfers: Min assist, +2 physical assistance     General transfer comment: Pt stood from raised bed height. Cued proper hand placement. Assisted pt with RLE positioning. She powered up with modA x2 and aid of bed pad to faciliate increased hip flex. Pt took increased time to achieve upright posture and accept weight on RLE. She transferred to recliner chair positioned on her left. Good eccentric control.      Balance Overall balance assessment: Needs assistance Sitting-balance support: Bilateral upper extremity supported, Feet supported, No upper extremity supported Sitting balance-Leahy Scale: Fair Sitting balance - Comments: Pt sat EOB with close supervision. She was able to reach down the front of her legs and back without LOB.   Standing balance support: Bilateral upper extremity supported, During functional activity, Reliant on assistive device for balance Standing balance-Leahy Scale: Poor  ADL either performed or assessed with clinical judgement   ADL Overall ADL's : Needs assistance/impaired Eating/Feeding: Set up;Independent;Sitting   Grooming: Wash/dry hands;Wash/dry face;Oral care;Supervision/safety;Set up;Sitting   Upper Body Bathing: Set  up;Supervision/ safety;Sitting   Lower Body Bathing: Maximal assistance   Upper Body Dressing : Set up;Supervision/safety;Sitting   Lower Body Dressing: Total assistance   Toilet Transfer: Moderate assistance;Minimal assistance;+2 for physical assistance;Ambulation;Rolling walker (2 wheels);Cueing for safety   Toileting- Clothing Manipulation and Hygiene: Total assistance       Functional mobility during ADLs: Moderate assistance;Minimal assistance;+2 for physical assistance;Rolling walker (2 wheels);Cueing for safety       Vision Baseline Vision/History: 1 Wears glasses Ability to See in Adequate Light: 0 Adequate Patient Visual Report: No change from baseline       Perception         Praxis         Pertinent Vitals/Pain Pain Assessment Pain Assessment: 0-10 Pain Score: 7  Pain Location: R Knee Pain Descriptors / Indicators: Operative site guarding, Grimacing, Discomfort Pain Intervention(s): Limited activity within patient's tolerance, Monitored during session, Premedicated before session, Repositioned     Extremity/Trunk Assessment Upper Extremity Assessment Upper Extremity Assessment: Generalized weakness;Overall WFL for tasks assessed;Right hand dominant;RUE deficits/detail RUE Deficits / Details: R wrist sore/painful. Asked MD for R wrist splint RUE: Unable to fully assess due to pain   Lower Extremity Assessment Lower Extremity Assessment: Defer to PT evaluation RLE Deficits / Details: Pt POD 1 s/p femur ORIF. She is in a ace bandage/dressing from mid-thigh to foot. Decreased hip and knee AROM. Limited PROM with pt actively resisting. Ankle WFL. Grossly 2/5 strength. RLE: Unable to fully assess due to pain RLE Sensation: WNL RLE Coordination: decreased gross motor   Cervical / Trunk Assessment Cervical / Trunk Assessment: Other exceptions Cervical / Trunk Exceptions: Increased Body Habitus   Communication Communication Communication: No apparent  difficulties   Cognition Arousal: Alert Behavior During Therapy: WFL for tasks assessed/performed Cognition: No apparent impairments                               Following commands: Intact       Cueing  General Comments   Cueing Techniques: Verbal cues;Gestural cues  VSS on RA   Exercises     Shoulder Instructions      Home Living Family/patient expects to be discharged to:: Private residence Living Arrangements: Other relatives Available Help at Discharge: Family;Available PRN/intermittently Type of Home: House Home Access: Stairs to enter [employer] of Steps: 1 Entrance Stairs-Rails: None Home Layout: Two level;Able to live on main level with bedroom/bathroom     Bathroom Shower/Tub: Tub/shower unit   Bathroom Toilet: Handicapped height     Home Equipment: Shower seat;Grab bars - toilet;Grab bars - tub/shower;Cane - single Librarian, academic (2 wheels);Rollator (4 wheels)          Prior Functioning/Environment Prior Level of Function : Independent/Modified Independent;Driving;History of Falls (last six months)             Mobility Comments: Ambulates without AD. 1 fall leading to current admission. ADLs Comments: Indep with ADLs/IADLs. Retired.    OT Problem List: Decreased strength;Obesity;Decreased activity tolerance;Impaired UE functional use;Pain;Impaired balance (sitting and/or standing)   OT Treatment/Interventions: Self-care/ADL training;Therapeutic exercise;Patient/family education;Balance training;Therapeutic activities;DME and/or AE instruction      OT Goals(Current goals can be found in the care plan section)   Acute Rehab OT Goals Patient Stated  Goal: go home OT Goal Formulation: With patient Time For Goal Achievement: 09/12/24 Potential to Achieve Goals: Good ADL Goals Pt Will Perform Grooming: with set-up;sitting Pt Will Perform Lower Body Bathing: with mod assist;sitting/lateral leans Pt Will Perform  Lower Body Dressing: with max assist;with mod assist;sitting/lateral leans Pt Will Transfer to Toilet: with mod assist;with min assist;ambulating;stand pivot transfer;bedside commode Pt Will Perform Toileting - Clothing Manipulation and hygiene: with max assist;with mod assist;sitting/lateral leans;sit to/from stand   OT Frequency:  Min 2X/week    Co-evaluation PT/OT/SLP Co-Evaluation/Treatment: Yes Reason for Co-Treatment: For patient/therapist safety;To address functional/ADL transfers PT goals addressed during session: Mobility/safety with mobility;Balance;Proper use of DME OT goals addressed during session: ADL's and self-care;Proper use of Adaptive equipment and DME      AM-PAC OT 6 Clicks Daily Activity     Outcome Measure Help from another person eating meals?: A Little Help from another person taking care of personal grooming?: A Little Help from another person toileting, which includes using toliet, bedpan, or urinal?: Total Help from another person bathing (including washing, rinsing, drying)?: A Lot Help from another person to put on and taking off regular upper body clothing?: A Little Help from another person to put on and taking off regular lower body clothing?: Total 6 Click Score: 13   End of Session Equipment Utilized During Treatment: Gait belt;Rolling walker (2 wheels) Nurse Communication: Mobility status;Other (comment) (asked MD for R wrist splint for comfort)  Activity Tolerance: Patient limited by pain;Patient limited by fatigue Patient left: in chair;with call bell/phone within reach;with chair alarm set;with family/visitor present  OT Visit Diagnosis: Unsteadiness on feet (R26.81);Other abnormalities of gait and mobility (R26.89);History of falling (Z91.81);Muscle weakness (generalized) (M62.81);Pain Pain - Right/Left: Right Pain - part of body: Knee (R wrist)                Time: 8943-8873 OT Time Calculation (min): 30 min Charges:  OT General  Charges $OT Visit: 1 Visit OT Evaluation $OT Eval Moderate Complexity: 1 Mod    Jacques Karna Loose 08/29/2024, 1:32 PM

## 2024-08-29 NOTE — TOC CAGE-AID Note (Signed)
 Transition of Care [facility]) - CAGE-AID Screening   Patient Details  Name: Kelly Taylor MRN: [id number] Date of Birth: 1951/07/24  Transition of Care [facility] CM/SW Contact:    Jamel Holzmann E Lyndell Gillyard, LCSW Phone Number: 08/29/2024, 1:06 PM   Clinical Narrative: Patient states she drinks wine about once a week. Patient denies other substance use. Patient denies substance resource needs.   CAGE-AID Screening:    Have You Ever Felt You Ought to Cut Down on Your Drinking or Drug Use?: No Have People Annoyed You By Critizing Your Drinking Or Drug Use?: No Have You Felt Bad Or Guilty About Your Drinking Or Drug Use?: No Have You Ever Had a Drink or Used Drugs First Thing In The Morning to Steady Your Nerves or to Get Rid of a Hangover?: No CAGE-AID Score: 0  Substance Abuse Education Offered: Yes

## 2024-08-29 NOTE — Progress Notes (Addendum)
 Orthopaedic Trauma Progress Note  SUBJECTIVE: Doing fair this morning.  Notes pain in the right knee, manageable with current medications.  Describes a burning sensation around her incisions.  We discussed this can be normal given the surgical approach.  Also notes pain in the right wrist/hand.  X-rays of the right wrist were negative for any acute fractures but did show significant CMC arthritis.  Notes pain with motion of the wrist in both flexion and extension.  She had an Ace wrap on the wrist but has taken off to work on some ROM.  She has not been out of bed yet since surgery, asking when therapies will be by.  No chest pain. No SOB. No nausea/vomiting. No other complaints.  No family at bedside currently.  OBJECTIVE:  Vitals:   08/29/24 0352 08/29/24 1010  BP: 115/84 106/69  Pulse: 92 95  Resp: 18 16  Temp: 98.3 F (36.8 C) 99.3 F (37.4 C)  SpO2: 93% 95%    Opiates Today (MME): Today's  total administered Morphine  Milligram Equivalents: 10 Opiates Yesterday (MME): Yesterday's total administered Morphine  Milligram Equivalents: -1  General: Laying in bed, no acute distress. Respiratory: No increased work of breathing.  Operative Extremity (RLE): Dressing clean, dry, intact.  Tenderness with palpation over the knee/distal thigh as expected.  Less tender in the proximal thigh.  No significant calf tenderness.  Ankle DF/PF intact. + EHL/FHL.  Compartment soft and compressible.  Foot warm and well-perfused. + DP pulse RUE: No significant bruising or swelling noted to the wrist or hand.  Has some tenderness along the distal radius that worsens with flexion/extension.  Notable tenderness over CMC joint and anatomical snuffbox.  IMAGING: Stable post op imaging right femur.   LABS:  Results for orders placed or performed during the hospital encounter of 08/27/24 (from the past 24 hours)  Surgical pcr screen     Status: Abnormal   Collection Time: 08/28/24  2:17 PM   Specimen: Nasal  Mucosa; Nasal Swab  Result Value Ref Range   MRSA, PCR POSITIVE (A) NEGATIVE   Staphylococcus aureus POSITIVE (A) NEGATIVE  Glucose, capillary     Status: Abnormal   Collection Time: 08/28/24  2:34 PM  Result Value Ref Range   Glucose-Capillary 133 (H) 70 - 99 mg/dL  Glucose, capillary     Status: Abnormal   Collection Time: 08/28/24  6:33 PM  Result Value Ref Range   Glucose-Capillary 138 (H) 70 - 99 mg/dL  Sample to Blood Bank     Status: None   Collection Time: 08/28/24  8:55 PM  Result Value Ref Range   Blood Bank Specimen SAMPLE AVAILABLE FOR TESTING    Sample Expiration      08/31/2024,2359 Performed at [facility], [address] [place], [place] [address]   Glucose, capillary     Status: Abnormal   Collection Time: 08/29/24 12:39 AM  Result Value Ref Range   Glucose-Capillary 152 (H) 70 - 99 mg/dL  VITAMIN D  25 Hydroxy (Vit-D Deficiency, Fractures)     Status: None   Collection Time: 08/29/24  2:27 AM  Result Value Ref Range   Vit D, 25-Hydroxy 32.42 30 - 100 ng/mL  Basic metabolic panel     Status: Abnormal   Collection Time: 08/29/24  2:28 AM  Result Value Ref Range   Sodium 137 135 - 145 mmol/L   Potassium 4.2 3.5 - 5.1 mmol/L   Chloride 100 98 - 111 mmol/L   CO2 28 22 -  32 mmol/L   Glucose, Bld 151 (H) 70 - 99 mg/dL   BUN 10 8 - 23 mg/dL   Creatinine, Ser 9.28 0.44 - 1.00 mg/dL   Calcium  8.7 (L) 8.9 - 10.3 mg/dL   GFR, Estimated >39 >39 mL/min   Anion gap 9 5 - 15  CBC     Status: Abnormal   Collection Time: 08/29/24  2:28 AM  Result Value Ref Range   WBC 10.2 4.0 - 10.5 K/uL   RBC 3.87 3.87 - 5.11 MIL/uL   Hemoglobin 11.2 (L) 12.0 - 15.0 g/dL   HCT 64.7 (L) 63.9 - 53.9 %   MCV 91.0 80.0 - 100.0 fL   MCH 28.9 26.0 - 34.0 pg   MCHC 31.8 30.0 - 36.0 g/dL   RDW 86.1 88.4 - 84.4 %   Platelets 229 150 - 400 K/uL   nRBC 0.0 0.0 - 0.2 %  Glucose, capillary     Status: Abnormal   Collection Time: 08/29/24  3:56 AM  Result Value Ref Range    Glucose-Capillary 148 (H) 70 - 99 mg/dL  Glucose, capillary     Status: Abnormal   Collection Time: 08/29/24  8:08 AM  Result Value Ref Range   Glucose-Capillary 187 (H) 70 - 99 mg/dL   Comment 1 Notify RN     ASSESSMENT: SEPTEMBER MORMILE is a 73 y.o. female, 1 Day Post-Op s/p fall Procedures: OPEN REDUCTION INTERNAL FIXATION RIGHT PERIPROSTHETIC DISTAL FEMUR FRACTURE  CV/Blood loss: Acute blood loss anemia, Hgb 11.2 this AM. Hemodynamically stable  PLAN: Weightbearing: WBAT RLE ROM: Unrestricted ROM Incisional and dressing care: Reinforce dressings as needed  Showering: Okay to be getting incisions wet starting 09/01/2024 if no drainage from incisions Orthopedic device(s): None  Pain management:  1. Tylenol  325-650 mg q 6 hours scheduled 2. Robaxin  500 mg q 6 hours PRN 3. Norco 5-325 or 7.5-325 mg q 4 hours PRN 4. Morphine  0.5-1 mg q 2 hours PRN VTE prophylaxis: Lovenox, SCDs ID: Vancomycin  post op Foley/Lines:  No foley, KVO IVFs Impediments to Fracture Healing: Vitamin D  level low normal at 32, will start on daily supplementation  Dispo: I have ordered patient some spica removable brace to use as needed for comfort.   PT/OT evaluation today, dispo pending.  Patient may require SNF at discharge which she appears agreeable to.  Will plan to change dressing RLE tomorrow 08/30/2024.  Okay for discharge from ortho standpoint once cleared by medicine team and therapies  D/C recommendations: - Hydrocodone , Robaxin , Tylenol  for pain control - Eliquis 2.5 mg twice daily x 30 days for DVT prophylaxis - Continue 1000 units Vit D supplementation daily x 90 days  Follow - up plan: 2 weeks after d/c for wound check and repeat x-rays   Contact information:  Franky Light MD, Lauraine Moores PA-C. After hours and holidays please check [email or web address] for group call information for [facility]   Lauraine PATRIC Moores, PA-C [phone] (office) [email or web address]

## 2024-08-29 NOTE — Progress Notes (Signed)
 PROGRESS NOTE    Kelly Taylor  FMW:[id number] DOB: 11-20-1951 DOA: 08/27/2024 PCP: Gerome Brunet, DO   Brief Narrative:  Kelly Taylor is an 73 y.o. female past medical history significant for severe obstructive sleep apnea on CPAP, morbid obesity class III, diabetes mellitus type 2, ascending aortic dilation, hypothyroidism, history of bilateral total knee replacement came into the ED after a fall on the day of admission, she fell to her right side and hit her head and right knee she has some right knee swelling and laceration, she also has a laceration of her lip and a nosebleed.  She relates prior to this she had 2 glasses of wine, was given fentanyl  by EMS her saturations dropped to 2 L, white count of 13 lactic acid of 3.0 alcohol level 31.  UA was positive for nitrates and negative for leukocytes esterase.  Knee x-ray showed a transverse fracture above the knee prosthesis with posterior displacement of the distal femoral neck.,  CT of the chest, abdomen pelvis cervical C-spine maxillofacial and head no acute findings.   Assessment & Plan:   Principal Problem:   Peri-prosthetic fracture of shaft of femur Active Problems:   Type 2 diabetes mellitus (HCC)   Other hyperlipidemia   Ascending aorta dilatation   Leukocytosis   Fracture of prosthetic knee, initial encounter  Right Peri-prosthetic supracondylar distal femur fracture secondary to mechanical fall, POA: Status post ORIF by Dr. Kendal on 08/28/2024..  Management per orthopedics.  To be seen by PT OT.  SIRS: Met criteria due to tachycardia and leukocytosis, both could be due to dehydration.  Infection ruled out.  SIRS resolved.   AAA: Annual follow-up with a CTA or MRI as an outpatient.   Severe obstructive sleep apnea: Continue CPAP.   Type 2 diabetes mellitus (HCC) A1c of 7.2. Continue sliding scale insulin  CBGs Q4.   Hypothyroidism: Continue Synthroid .   Hyperlipidemia: continue Zocor .   Overactive bladder: She  was restarted on her home meds.  DVT prophylaxis: enoxaparin (LOVENOX) injection 40 mg Start: 08/29/24 0800 SCDs Start: 08/29/24 0029 SCDs Start: 08/28/24 9386   Code Status: Full Code  Family Communication:  None present at bedside.  Plan of care discussed with patient in length and he/she verbalized understanding and agreed with it.  Status is: Inpatient Remains inpatient appropriate because: To be seen by PT OT, will likely require SNF discharge.   Estimated body mass index is 43.85 kg/m as calculated from the following:   Height as of this encounter: 5' 7 (1.702 m).   Weight as of this encounter: 127 kg.    Nutritional Assessment: Body mass index is 43.85 kg/m.Kelly Taylor Seen by dietician.  I agree with the assessment and plan as outlined below: Nutrition Status:        . Skin Assessment: I have examined the patient's skin and I agree with the wound assessment as performed by the wound care RN as outlined below:    Consultants:  Orthopedics  Procedures:  As above  Antimicrobials:  Anti-infectives (From admission, onward)    Start     Dose/Rate Route Frequency Ordered Stop   08/29/24 0400  vancomycin  (VANCOCIN ) IVPB 1000 mg/200 mL premix        1,000 mg 200 mL/hr over 60 Minutes Intravenous Every 12 hours 08/29/24 0028 08/29/24 0716   08/28/24 1705  vancomycin  (VANCOCIN ) powder  Status:  Discontinued          As needed 08/28/24 1705 08/28/24 1752  Subjective: Patient seen and examined, she was about to start working with PT OT, only complaint was right knee pain which was tolerable for her.  No other complaint.  Objective: Vitals:   08/28/24 2015 08/29/24 0029 08/29/24 0043 08/29/24 0352  BP: 123/79  129/81 115/84  Pulse: (!) 104  (!) 101 92  Resp: 16  18 18   Temp:   98.9 F (37.2 C) 98.3 F (36.8 C)  TempSrc:   Oral Axillary  SpO2: 97% 93% 95% 93%  Weight:      Height:        Intake/Output Summary (Last 24 hours) at 08/29/2024 0805 Last data  filed at 08/29/2024 0354 Gross per 24 hour  Intake 1050 ml  Output 850 ml  Net 200 ml   Filed Weights   08/27/24 2128  Weight: 127 kg    Examination:  General exam: Appears calm and comfortable  Respiratory system: Clear to auscultation. Respiratory effort normal. Cardiovascular system: S1 & S2 heard, RRR. No JVD, murmurs, rubs, gallops or clicks. No pedal edema. Gastrointestinal system: Abdomen is nondistended, soft and nontender. No organomegaly or masses felt. Normal bowel sounds heard. Central nervous system: Alert and oriented. No focal neurological deficits. Extremities: Right lower extremity dressing. Skin: No rashes, lesions or ulcers Psychiatry: Judgement and insight appear normal. Mood & affect appropriate.    Data Reviewed: I have personally reviewed following labs and imaging studies  CBC: Recent Labs  Lab 08/27/24 2300 08/27/24 2351 08/28/24 0635 08/29/24 0228  WBC  --  13.7* 14.7* 10.2  HGB 13.6 12.5 12.7 11.2*  HCT 40.0 41.7 40.6 35.2*  MCV  --  91.9 91.6 91.0  PLT  --  284 247 229   Basic Metabolic Panel: Recent Labs  Lab 08/27/24 2300 08/27/24 2351 08/29/24 0228  NA 141 141 137  K 3.8 3.8 4.2  CL 104 104 100  CO2  --  24 28  GLUCOSE 180* 171* 151*  BUN 16 15 10   CREATININE 0.70 0.63 0.71  CALCIUM   --  9.6 8.7*   GFR: Estimated Creatinine Clearance: 90.7 mL/min (by C-G formula based on SCr of 0.71 mg/dL). Liver Function Tests: Recent Labs  Lab 08/27/24 2351  AST 18  ALT 16  ALKPHOS 87  BILITOT 0.3  PROT 6.6  ALBUMIN 3.9   No results for input(s): LIPASE, AMYLASE in the last 168 hours. No results for input(s): AMMONIA in the last 168 hours. Coagulation Profile: Recent Labs  Lab 08/27/24 2351  INR 0.9   Cardiac Enzymes: No results for input(s): CKTOTAL, CKMB, CKMBINDEX, TROPONINI in the last 168 hours. BNP (last 3 results) No results for input(s): PROBNP in the last 8760 hours. HbA1C: Recent Labs     08/28/24 0635  HGBA1C 6.8*   CBG: Recent Labs  Lab 08/28/24 0854 08/28/24 1434 08/28/24 1833 08/29/24 0039 08/29/24 0356  GLUCAP 153* 133* 138* 152* 148*   Lipid Profile: No results for input(s): CHOL, HDL, LDLCALC, TRIG, CHOLHDL, LDLDIRECT in the last 72 hours. Thyroid  Function Tests: No results for input(s): TSH, T4TOTAL, FREET4, T3FREE, THYROIDAB in the last 72 hours. Anemia Panel: No results for input(s): VITAMINB12, FOLATE, FERRITIN, TIBC, IRON, RETICCTPCT in the last 72 hours. Sepsis Labs: Recent Labs  Lab 08/27/24 2301 08/28/24 0635 08/28/24 0700  PROCALCITON  --   --  <0.10  LATICACIDVEN 3.0* 3.3*  --     Recent Results (from the past 240 hours)  Surgical pcr screen     Status: Abnormal  Collection Time: 08/28/24  2:17 PM   Specimen: Nasal Mucosa; Nasal Swab  Result Value Ref Range Status   MRSA, PCR POSITIVE (A) NEGATIVE Final    Comment: RESULT CALLED TO, READ BACK BY AND VERIFIED WITH: EMAILED LISA H. [id number] AT 2040, ADC    Staphylococcus aureus POSITIVE (A) NEGATIVE Final    Comment: (NOTE) The Xpert SA Assay (FDA approved for NASAL specimens in patients 36 years of age and older), is one component of a comprehensive surveillance program. It is not intended to diagnose infection nor to guide or monitor treatment. Performed at [facility], [address] [place], [place] [address]      Radiology Studies: DG FEMUR PORT, MIN 2 VIEWS RIGHT Result Date: 08/28/2024 CLINICAL DATA:  Postop femur fracture fixation. EXAM: RIGHT FEMUR PORTABLE 2 VIEW COMPARISON:  Preoperative imaging FINDINGS: Lateral plate and screw fixation of periprosthetic distal femur fracture. Decreased angulation from preoperative imaging. Previous knee and hip arthroplasty. Recent postsurgical change includes air and edema in the soft tissues. IMPRESSION: ORIF of periprosthetic distal femur fracture. Electronically Signed   By: Andrea Gasman  M.D.   On: 08/28/2024 20:46   DG FEMUR, MIN 2 VIEWS RIGHT Result Date: 08/28/2024 CLINICAL DATA:  Elective surgery. EXAM: RIGHT FEMUR 2 VIEWS COMPARISON:  Preoperative imaging FINDINGS: Six fluoroscopic spot views of the femur submitted from the operating room. Plate and screw fixation distal femur fracture. Previous knee arthroplasty. Fluoroscopy time 44.6 seconds. Dose 3.6214 mGy. IMPRESSION: Intraoperative fluoroscopy during distal femur fracture ORIF. Electronically Signed   By: Andrea Gasman M.D.   On: 08/28/2024 20:43   DG C-Arm 1-60 Min-No Report Result Date: 08/28/2024 Fluoroscopy was utilized by the requesting physician.  No radiographic interpretation.   DG C-Arm 1-60 Min-No Report Result Date: 08/28/2024 Fluoroscopy was utilized by the requesting physician.  No radiographic interpretation.   DG Wrist 2 Views Right Result Date: 08/28/2024 EXAM: 2 VIEW(S) XRAY OF THE RIGHT WRIST 08/28/2024 07:41:00 AM COMPARISON: None available. CLINICAL HISTORY: 73 year old female. Fall on right side, hit head and right knee. Swelling right knee, lip laceration, nose bleed. Cannot bear weight right leg. History of DM. FINDINGS: BONES AND JOINTS: Chronic degeneration about the right wrist including severe 1st CMC joint osteoarthritis with subchondral sclerosis and osteophytosis. Chondrocalcinosis suspected at the wrist. No acute fracture or dislocation identified. SOFT TISSUES: Nonspecific generalized soft tissue swelling. IMPRESSION: 1. No acute fracture or dislocation. Nonspecific generalized soft tissue swelling. 2. Advanced degeneration, Severe first carpometacarpal osteoarthritis. Electronically signed by: Helayne Hurst MD 08/28/2024 08:18 AM EDT RP Workstation: [id number]   CT CHEST ABDOMEN PELVIS W CONTRAST Result Date: 08/28/2024 CLINICAL DATA:  Polytrauma, blunt EXAM: CT CHEST, ABDOMEN, AND PELVIS WITH CONTRAST TECHNIQUE: Multidetector CT imaging of the chest, abdomen and pelvis was performed  following the standard protocol during bolus administration of intravenous contrast. RADIATION DOSE REDUCTION: This exam was performed according to the departmental dose-optimization program which includes automated exposure control, adjustment of the mA and/or kV according to patient size and/or use of iterative reconstruction technique. CONTRAST:  OMNIPAQUE  IOHEXOL  300 MG/ML  SOLN COMPARISON:  CT abdomen pelvis 07/10/2024, chest x-ray 06/18/2023 FINDINGS: CHEST: Cardiovascular: No aortic injury. Aneurysmal ascending thoracic aorta (4.1 cm). The thoracic aorta is normal in caliber. The heart is normal in size. No significant pericardial effusion. Mediastinum/Nodes: No pneumomediastinum. No mediastinal hematoma. The esophagus is unremarkable. The thyroid  is unremarkable. The central airways are patent. No mediastinal, hilar, or axillary lymphadenopathy. Lungs/Pleura: No focal  consolidation. No pulmonary nodule. No pulmonary mass. No pulmonary contusion or laceration. No pneumatocele formation. No pleural effusion. No pneumothorax. No hemothorax. Musculoskeletal/Chest wall: No chest wall mass. No acute rib or sternal fracture. No spinal fracture. Total right shoulder arthroplasty. Multilevel degenerative changes of the spine. ABDOMEN / PELVIS: Hepatobiliary: Not enlarged. No focal lesion. No laceration or subcapsular hematoma. Status post cholecystectomy. No biliary ductal dilatation. Mitral annular calcification. Pancreas: Normal pancreatic contour. No main pancreatic duct dilatation. Spleen: Not enlarged. No focal lesion. No laceration, subcapsular hematoma, or vascular injury. Adrenals/Urinary Tract: No nodularity bilaterally. Bilateral kidneys enhance symmetrically. No hydronephrosis. No contusion, laceration, or subcapsular hematoma. Fluid is lesion of left kidney likely represents a simple renal cyst. Simple renal cysts, in the absence of clinically indicated signs/symptoms, require no independent  follow-up. No injury to the vascular structures or collecting systems. No hydroureter. The urinary bladder is unremarkable. On delayed imaging, there is no urothelial wall thickening and there are no filling defects in the opacified portions of the bilateral collecting systems or ureters. Stomach/Bowel: No small or large bowel wall thickening or dilatation. The appendix is not definitely identified with no inflammatory changes in the right lower quadrant to suggest acute appendicitis. Vasculature/Lymphatics: Mild atherosclerotic plaque. No abdominal aorta or iliac aneurysm. No active contrast extravasation or pseudoaneurysm. No abdominal, pelvic, inguinal lymphadenopathy. Reproductive: Normal. Other: No simple free fluid ascites. No pneumoperitoneum. No hemoperitoneum. No mesenteric hematoma identified. No organized fluid collection. Musculoskeletal: No significant soft tissue hematoma. Small fat containing left inguinal hernia. No acute pelvic fracture. Total right hip arthroplasty. No spinal fracture. Multilevel degenerative change of the spine. Appear L4 posterolateral and interbody surgical hardware fusion. Neural stimulator with the anterior to the sacrum. Other ports and devices: None. IMPRESSION: 1. No acute intrathoracic, intra-abdominal, intrapelvic traumatic injury. 2. No acute fracture or traumatic malalignment of the thoracic or lumbar spine. 3. Aneurysmal ascending thoracic aorta (4.1 cm). Recommend annual imaging followup by CTA or MRA. This recommendation follows 2010 ACCF/AHA/AATS/ACR/ASA/SCA/SCAI/SIR/STS/SVM Guidelines for the Diagnosis and Management of Patients with Thoracic Aortic Disease. Circulation. 2010; 121: Z733-z630. Aortic aneurysm NOS (ICD10-I71.9). Electronically Signed   By: Morgane  Naveau M.D.   On: 08/28/2024 00:24   CT HEAD WO CONTRAST Result Date: 08/28/2024 CLINICAL DATA:  Head trauma, moderate-severe; Polytrauma, blunt; Facial trauma, blunt. Fall EXAM: CT HEAD WITHOUT  CONTRAST CT MAXILLOFACIAL WITHOUT CONTRAST CT CERVICAL SPINE WITHOUT CONTRAST TECHNIQUE: Multidetector CT imaging of the head, cervical spine, and maxillofacial structures were performed using the standard protocol without intravenous contrast. Multiplanar CT image reconstructions of the cervical spine and maxillofacial structures were also generated. RADIATION DOSE REDUCTION: This exam was performed according to the departmental dose-optimization program which includes automated exposure control, adjustment of the mA and/or kV according to patient size and/or use of iterative reconstruction technique. COMPARISON:  CT head 02/11/2014 FINDINGS: CT HEAD FINDINGS Brain: Trace patchy and confluent areas of decreased attenuation are noted throughout the deep and periventricular white matter of the cerebral hemispheres bilaterally, compatible with chronic microvascular ischemic disease. No evidence of large-territorial acute infarction. No parenchymal hemorrhage. No mass lesion. No extra-axial collection. No mass effect or midline shift. No hydrocephalus. Basilar cisterns are patent. Vascular: No hyperdense vessel. Skull: No acute fracture or focal lesion. Other: None. CT MAXILLOFACIAL FINDINGS Osseous: No fracture or mandibular dislocation. No destructive process. Sinuses/Orbits: Paranasal sinuses and mastoid air cells are clear. The orbits are unremarkable. Soft tissues: Negative. CT CERVICAL SPINE FINDINGS Alignment: Grade 1 anterolisthesis of C4 on C5. Grade 1  anterolisthesis of C6 on C7. Skull base and vertebrae: Multilevel severe degenerative changes of the spine. No severe osseous neural foraminal or central canal stenosis. No acute fracture. No aggressive appearing focal osseous lesion or focal pathologic process. Soft tissues and spinal canal: No prevertebral fluid or swelling. No visible canal hematoma. Upper chest: Unremarkable. Other: Aneurysmal ascending thoracic aorta measuring up to 4.1 cm in caliber.  IMPRESSION: 1. No acute intracranial abnormality. 2.  No acute displaced facial fracture. 3. No acute displaced fracture or traumatic listhesis of the cervical spine. 4. Aneurysmal ascending thoracic aorta (4.1 cm). Recommend annual imaging followup by CTA or MRA. This recommendation follows 2010 ACCF/AHA/AATS/ACR/ASA/SCA/SCAI/SIR/STS/SVM Guidelines for the Diagnosis and Management of Patients with Thoracic Aortic Disease. Circulation. 2010; 121: [id number]. Aortic aneurysm NOS (ICD10-I71.9). Electronically Signed   By: Morgane  Naveau M.D.   On: 08/28/2024 00:16   CT MAXILLOFACIAL WO CONTRAST Result Date: 08/28/2024 CLINICAL DATA:  Head trauma, moderate-severe; Polytrauma, blunt; Facial trauma, blunt. Fall EXAM: CT HEAD WITHOUT CONTRAST CT MAXILLOFACIAL WITHOUT CONTRAST CT CERVICAL SPINE WITHOUT CONTRAST TECHNIQUE: Multidetector CT imaging of the head, cervical spine, and maxillofacial structures were performed using the standard protocol without intravenous contrast. Multiplanar CT image reconstructions of the cervical spine and maxillofacial structures were also generated. RADIATION DOSE REDUCTION: This exam was performed according to the departmental dose-optimization program which includes automated exposure control, adjustment of the mA and/or kV according to patient size and/or use of iterative reconstruction technique. COMPARISON:  CT head 02/11/2014 FINDINGS: CT HEAD FINDINGS Brain: Trace patchy and confluent areas of decreased attenuation are noted throughout the deep and periventricular white matter of the cerebral hemispheres bilaterally, compatible with chronic microvascular ischemic disease. No evidence of large-territorial acute infarction. No parenchymal hemorrhage. No mass lesion. No extra-axial collection. No mass effect or midline shift. No hydrocephalus. Basilar cisterns are patent. Vascular: No hyperdense vessel. Skull: No acute fracture or focal lesion. Other: None. CT MAXILLOFACIAL FINDINGS  Osseous: No fracture or mandibular dislocation. No destructive process. Sinuses/Orbits: Paranasal sinuses and mastoid air cells are clear. The orbits are unremarkable. Soft tissues: Negative. CT CERVICAL SPINE FINDINGS Alignment: Grade 1 anterolisthesis of C4 on C5. Grade 1 anterolisthesis of C6 on C7. Skull base and vertebrae: Multilevel severe degenerative changes of the spine. No severe osseous neural foraminal or central canal stenosis. No acute fracture. No aggressive appearing focal osseous lesion or focal pathologic process. Soft tissues and spinal canal: No prevertebral fluid or swelling. No visible canal hematoma. Upper chest: Unremarkable. Other: Aneurysmal ascending thoracic aorta measuring up to 4.1 cm in caliber. IMPRESSION: 1. No acute intracranial abnormality. 2.  No acute displaced facial fracture. 3. No acute displaced fracture or traumatic listhesis of the cervical spine. 4. Aneurysmal ascending thoracic aorta (4.1 cm). Recommend annual imaging followup by CTA or MRA. This recommendation follows 2010 ACCF/AHA/AATS/ACR/ASA/SCA/SCAI/SIR/STS/SVM Guidelines for the Diagnosis and Management of Patients with Thoracic Aortic Disease. Circulation. 2010; 121: [id number]. Aortic aneurysm NOS (ICD10-I71.9). Electronically Signed   By: Morgane  Naveau M.D.   On: 08/28/2024 00:16   CT CERVICAL SPINE WO CONTRAST Result Date: 08/28/2024 CLINICAL DATA:  Head trauma, moderate-severe; Polytrauma, blunt; Facial trauma, blunt. Fall EXAM: CT HEAD WITHOUT CONTRAST CT MAXILLOFACIAL WITHOUT CONTRAST CT CERVICAL SPINE WITHOUT CONTRAST TECHNIQUE: Multidetector CT imaging of the head, cervical spine, and maxillofacial structures were performed using the standard protocol without intravenous contrast. Multiplanar CT image reconstructions of the cervical spine and maxillofacial structures were also generated. RADIATION DOSE REDUCTION: This exam was performed according to the departmental  dose-optimization program which  includes automated exposure control, adjustment of the mA and/or kV according to patient size and/or use of iterative reconstruction technique. COMPARISON:  CT head 02/11/2014 FINDINGS: CT HEAD FINDINGS Brain: Trace patchy and confluent areas of decreased attenuation are noted throughout the deep and periventricular white matter of the cerebral hemispheres bilaterally, compatible with chronic microvascular ischemic disease. No evidence of large-territorial acute infarction. No parenchymal hemorrhage. No mass lesion. No extra-axial collection. No mass effect or midline shift. No hydrocephalus. Basilar cisterns are patent. Vascular: No hyperdense vessel. Skull: No acute fracture or focal lesion. Other: None. CT MAXILLOFACIAL FINDINGS Osseous: No fracture or mandibular dislocation. No destructive process. Sinuses/Orbits: Paranasal sinuses and mastoid air cells are clear. The orbits are unremarkable. Soft tissues: Negative. CT CERVICAL SPINE FINDINGS Alignment: Grade 1 anterolisthesis of C4 on C5. Grade 1 anterolisthesis of C6 on C7. Skull base and vertebrae: Multilevel severe degenerative changes of the spine. No severe osseous neural foraminal or central canal stenosis. No acute fracture. No aggressive appearing focal osseous lesion or focal pathologic process. Soft tissues and spinal canal: No prevertebral fluid or swelling. No visible canal hematoma. Upper chest: Unremarkable. Other: Aneurysmal ascending thoracic aorta measuring up to 4.1 cm in caliber. IMPRESSION: 1. No acute intracranial abnormality. 2.  No acute displaced facial fracture. 3. No acute displaced fracture or traumatic listhesis of the cervical spine. 4. Aneurysmal ascending thoracic aorta (4.1 cm). Recommend annual imaging followup by CTA or MRA. This recommendation follows 2010 ACCF/AHA/AATS/ACR/ASA/SCA/SCAI/SIR/STS/SVM Guidelines for the Diagnosis and Management of Patients with Thoracic Aortic Disease. Circulation. 2010; 121: [id number]. Aortic  aneurysm NOS (ICD10-I71.9). Electronically Signed   By: Morgane  Naveau M.D.   On: 08/28/2024 00:16   DG Chest Port 1 View Result Date: 08/27/2024 CLINICAL DATA:  Preoperative evaluation EXAM: PORTABLE CHEST 1 VIEW COMPARISON:  06/18/2023 FINDINGS: Cardiac shadow is within normal limits. Tortuous thoracic aorta is noted. Lungs are clear bilaterally. Bilateral shoulder replacements are seen. IMPRESSION: No acute abnormality noted. Electronically Signed   By: Oneil Devonshire M.D.   On: 08/27/2024 22:59   DG Knee 1-2 Views Right Result Date: 08/27/2024 CLINICAL DATA:  Recent fall with right knee pain, initial encounter EXAM: RIGHT KNEE - 2 VIEW COMPARISON:  None Available. FINDINGS: Right knee prosthesis is noted. Immediately superior to the prosthesis there is a transverse fracture through the distal femoral metaphysis. The distal fracture fragment is posteriorly displaced with respect proximal femoral shaft. IMPRESSION: Transverse fracture just above the patient's right knee prosthesis with posterior displacement of the distal femoral fracture. Electronically Signed   By: Oneil Devonshire M.D.   On: 08/27/2024 22:59   DG Hip Unilat W or Wo Pelvis 2-3 Views Right Result Date: 08/27/2024 CLINICAL DATA:  Recent fall with right hip pain, initial encounter EXAM: DG HIP (WITH OR WITHOUT PELVIS) 3V RIGHT COMPARISON:  07/08/2019 FINDINGS: Pelvic ring is intact. Right hip replacement is noted. Right InterStim device is seen. Postsurgical changes in the lower lumbar spine are noted. Mild degenerative changes of the left hip joint are seen. No acute fracture is noted. IMPRESSION: Chronic changes without acute abnormality. Electronically Signed   By: Oneil Devonshire M.D.   On: 08/27/2024 22:53    Scheduled Meds:  docusate sodium   100 mg Oral BID   enoxaparin (LOVENOX) injection  40 mg Subcutaneous Q24H   fentaNYL  (SUBLIMAZE ) injection  50 mcg Intravenous Once   fesoterodine   4 mg Oral Daily   insulin  aspart  0-9 Units  Subcutaneous Q4H  levothyroxine   75 mcg Oral QAC breakfast   polyethylene glycol  17 g Oral BID   simvastatin   10 mg Oral QHS   Continuous Infusions:   LOS: 1 day   Fredia Skeeter, MD [facility]  08/29/2024, 8:05 AM   *Please note that this is a verbal dictation therefore any spelling or grammatical errors are due to the Dragon Medical One system interpretation.  Please page via Amion and do not message via secure chat for urgent patient care matters. Secure chat can be used for non urgent patient care matters.  How to contact the [facility] Attending or Consulting provider 7A - 7P or covering provider during after hours 7P -7A, for this patient?  Check the care team in [facility] and look for a) attending/consulting [facility] provider listed and b) the [facility] team listed. Page or secure chat 7A-7P. Log into [email or web address] and use [place] universal password to access. If you do not have the password, please contact the hospital operator. Locate the [facility] provider you are looking for under [facility] and page to a number that you can be directly reached. If you still have difficulty reaching the provider, please page the [facility] (Director on Call) for the Hospitalists listed on amion for assistance.

## 2024-08-30 ENCOUNTER — Encounter (HOSPITAL_COMMUNITY): Payer: Self-pay | Admitting: Internal Medicine

## 2024-08-30 ENCOUNTER — Inpatient Hospital Stay (HOSPITAL_COMMUNITY)

## 2024-08-30 DIAGNOSIS — B9689 Other specified bacterial agents as the cause of diseases classified elsewhere: Secondary | ICD-10-CM

## 2024-08-30 DIAGNOSIS — D62 Acute posthemorrhagic anemia: Secondary | ICD-10-CM

## 2024-08-30 DIAGNOSIS — J9601 Acute respiratory failure with hypoxia: Secondary | ICD-10-CM | POA: Diagnosis not present

## 2024-08-30 DIAGNOSIS — N390 Urinary tract infection, site not specified: Secondary | ICD-10-CM

## 2024-08-30 DIAGNOSIS — M978XXA Periprosthetic fracture around other internal prosthetic joint, initial encounter: Secondary | ICD-10-CM | POA: Diagnosis not present

## 2024-08-30 LAB — URINE CULTURE: Culture: >=100000 — AB

## 2024-08-30 LAB — CBC
HCT: 32.1 % — ABNORMAL LOW (ref 36.0–46.0)
Hemoglobin: 10.3 g/dL — ABNORMAL LOW (ref 12.0–15.0)
MCH: 28.9 pg (ref 26.0–34.0)
MCHC: 32.1 g/dL (ref 30.0–36.0)
MCV: 90.2 fL (ref 80.0–100.0)
Platelets: 209 K/uL (ref 150–400)
RBC: 3.56 MIL/uL — ABNORMAL LOW (ref 3.87–5.11)
RDW: 13.3 % (ref 11.5–15.5)
WBC: 9.1 K/uL (ref 4.0–10.5)
nRBC: 0 % (ref 0.0–0.2)

## 2024-08-30 LAB — GLUCOSE, CAPILLARY
Glucose-Capillary: 162 mg/dL — ABNORMAL HIGH (ref 70–99)
Glucose-Capillary: 147 mg/dL — ABNORMAL HIGH (ref 70–99)
Glucose-Capillary: 128 mg/dL — ABNORMAL HIGH (ref 70–99)
Glucose-Capillary: 151 mg/dL — ABNORMAL HIGH (ref 70–99)
Glucose-Capillary: 136 mg/dL — ABNORMAL HIGH (ref 70–99)
Glucose-Capillary: 165 mg/dL — ABNORMAL HIGH (ref 70–99)

## 2024-08-30 MED ORDER — ENOXAPARIN SODIUM 60 MG/0.6ML IJ SOSY
60.0000 mg | PREFILLED_SYRINGE | INTRAMUSCULAR | Status: DC
  Administered 2024-08-31 – 2024-09-02 (×3): 60 mg via SUBCUTANEOUS
  Filled 2024-08-30 (×3): qty 0.6

## 2024-08-30 MED ORDER — SODIUM CHLORIDE 0.9 % IV SOLN
1.0000 g | Freq: Three times a day (TID) | INTRAVENOUS | Status: DC
  Filled 2024-08-30: qty 20

## 2024-08-30 MED ORDER — ACETAMINOPHEN 500 MG PO TABS: 1000.0000 mg | ORAL_TABLET | Freq: Four times a day (QID) | ORAL | Status: DC | PRN

## 2024-08-30 MED ORDER — SODIUM CHLORIDE 0.9 % IV SOLN
2.0000 g | INTRAVENOUS | Status: DC
  Administered 2024-08-30 – 2024-08-31 (×2): 2 g via INTRAVENOUS
  Filled 2024-08-30 (×3): qty 20

## 2024-08-30 MED ORDER — INSULIN ASPART 100 UNIT/ML IJ SOLN
0.0000 [IU] | Freq: Three times a day (TID) | INTRAMUSCULAR | Status: DC
  Administered 2024-08-30: 1 [IU] via SUBCUTANEOUS
  Administered 2024-08-31 – 2024-09-01 (×4): 2 [IU] via SUBCUTANEOUS
  Administered 2024-09-01 – 2024-09-02 (×3): 1 [IU] via SUBCUTANEOUS

## 2024-08-30 NOTE — Plan of Care (Signed)
  Problem: Coping: Goal: Ability to adjust to condition or change in health will improve Outcome: Progressing   Problem: Fluid Volume: Goal: Ability to maintain a balanced intake and output will improve Outcome: Progressing   Problem: Metabolic: Goal: Ability to maintain appropriate glucose levels will improve Outcome: Progressing   Problem: Nutritional: Goal: Maintenance of adequate nutrition will improve Outcome: Progressing   Problem: Skin Integrity: Goal: Risk for impaired skin integrity will decrease Outcome: Progressing   Problem: Clinical Measurements: Goal: Will remain free from infection Outcome: Progressing

## 2024-08-30 NOTE — Assessment & Plan Note (Addendum)
 08/30/24 Body mass index is 43.85 kg/m.

## 2024-08-30 NOTE — Assessment & Plan Note (Addendum)
 08/30/24 initial CXR clear. Repeat CXR. Pulmonary toileting.  08/31/24 last night CXR negative for pneumonia. Has been on lovenox for DVT prophylaxis since admission. Doubt PE. Pulmonary toileting. Wean O2 to RA. Confirmed with pt that she does not use home O2.  09/01/24 weaned to RA. Continue with pulmonary toileting.  09/02/24 resolved. Continue with pulmonary toileting at SNF to prevent pneumonia.

## 2024-08-30 NOTE — Assessment & Plan Note (Signed)
 08/30/24 POD #2 from ORIF of peri-prosthetic fracture. Has seen PT/OT. Needs to see CM/TOC about SNF placement.  08/31/24 POD #3. Needs SNF. CM/TOC consulted to help with SNF placement. Pt has traditional medicare A/B.  09/01/24 POD #4. Pt has selected [facility] SNF. Bed available tomorrow.  09/02/24 POD #5. DC to [facility] SNF. Ortho has discharged her on Eliquis 2.5 mg bid for DVT prophylaxis.  Ortho will decide duration of Eliquis after pt is seen in f/u clinic.

## 2024-08-30 NOTE — Assessment & Plan Note (Deleted)
08/30/24   

## 2024-08-30 NOTE — Assessment & Plan Note (Addendum)
 08/30/24 on SSI.

## 2024-08-30 NOTE — Assessment & Plan Note (Addendum)
 08/30/24 resolved. likely due to acute fracture, UTI.

## 2024-08-30 NOTE — Plan of Care (Signed)

## 2024-08-30 NOTE — Progress Notes (Signed)
 PROGRESS NOTE    Kelly Taylor  FMW:[id number] DOB: 18-Aug-1951 DOA: 08/27/2024 PCP: Gerome Brunet, DO  Subjective: Pt seen and examined. She still has not met with CM/TOC yet about SNF. Urine cx growing klebsiella. Verified with pharmacy that pt has taken cephalosporins before. PT/OT recommending SNF at discharge.  Pt states she fell in her driveway after she slipped on some pecan nuts that had fallen off her neighbor's tree and onto her driveway. She doesn't mention anything about her alcohol consumption that evening.   Hospital Course: CC: fall at home onto driveway HPI: Kelly Taylor is a 73 y.o. female with medical history significant of severe OSA on CPAP, mild obstructive airway disease, class III obesity (BMI 43.85), chronic back pain, type 2 diabetes, anemia, ascending aorta dilation, depression, GERD, hypothyroidism, IBS, vertigo, history of bilateral total knee arthroplasty, hyperlipidemia presenting the ED via EMS after a fall tonight.  Patient fell on her right side and hit her head and right knee.  Presented with swelling of right knee and swelling and laceration of lip, nosebleed.  She had 2 glasses of wine tonight.  She was given fentanyl  by EMS and oxygen saturation dropped to the high 80s requiring 2 L [place].  Labs notable for WBC count 13.7, glucose 171, lactic acid 3.0, ethanol level 31.  UA with positive nitrite, negative leukocytes, and microscopy showing 0-5 WBCs and many bacteria.  X-ray of right knee showing transverse fracture just above right knee prosthesis with posterior displacement of the distal femoral fracture.  X-rays of chest and right hip/pelvis showing no acute findings.  CT head/maxillofacial/C-spine and chest/abdomen/pelvis with contrast negative for acute traumatic findings.  Patient was given fentanyl , Dilaudid , Zofran , Compazine, and 1 L IV fluids in the ED.  Orthopedics consulted and [facility] called to admit.   Patient states she was in her usual state of health,  feeling well.  She was walking on her driveway when she accidentally tripped on a paver and fell.  She reports falling on her right knee and hitting the right side of her face/head on floor.  Denies loss of consciousness.  She was not able to get up and walk due to severe pain in her right knee.  She feels nauseous due to severe pain whenever she tries to move her leg.  Denies vomiting or abdominal pain.  Denies chest pain or shortness of breath.  Denies any urinary symptoms.  Significant Events: Admitted 08/27/2024 for right periprosthetic femur fracture 08-27-2024 seen by ortho consult 08-28-2024 taken to OR for ORIF right supracondylar distal femur fracture  Admission Labs: Na 141, K 3.8, CO2 of 24, BUN 15, scr 0.63, glu 171 T. Prot 6.6, alb 3.9, AST 18, ALT 16, alk phos 87, t. Bili 0.3 WBC 13.7, HgB 12.5, plt 284 ETOH 31 INR 0.9 UA spg >1.046, POSITIVE nitrite, negative LE, WBC 0-5, many bacteria Procalcitonin < 0.10  Admission Imaging Studies:   Significant Labs: Urine cx Klebsiella pneumonaie A1c of 6.8%  Significant Imaging Studies:   Antibiotic Therapy: Anti-infectives (From admission, onward)    Start     Dose/Rate Route Frequency Ordered Stop   08/29/24 0400  vancomycin  (VANCOCIN ) IVPB 1000 mg/200 mL premix        1,000 mg 200 mL/hr over 60 Minutes Intravenous Every 12 hours 08/29/24 0028 08/29/24 0716   08/28/24 1705  vancomycin  (VANCOCIN ) powder  Status:  Discontinued          As needed 08/28/24 1705 08/28/24 1752  Procedures: 08-28-2024 taken to OR for ORIF right supracondylar distal femur fracture  Consultants: ortho    Assessment and Plan: * Peri-prosthetic fracture of shaft of femur - right 08/30/24 POD #2 from ORIF of peri-prosthetic fracture. Has seen PT/OT. Needs to see CM/TOC about SNF placement.   Acute respiratory failure with hypoxia (HCC) 08/30/24 initial CXR clear. Repeat CXR. Pulmonary toileting.   UTI due to Klebsiella  species 08/30/24 start IV rocephin. Pharmacy has verified that pt has taken cephalosporins in the past without difficulty.   Leukocytosis 08/30/24 resolved. likely due to acute fracture, UTI.   Postoperative anemia due to acute blood loss 08/30/24 HgB 10.3 g/dl. Preop HgB of 13.6. continue to monitor.    Type 2 diabetes mellitus with other specified complication (HCC) 08/30/24 on SSI.   Other hyperlipidemia 08/30/24 continue zocor    Morbid obesity (HCC) 08/30/24 Body mass index is 43.85 kg/m.    DVT prophylaxis: SCDs Start: 08/29/24 0029 SCDs Start: 08/28/24 9386  Lovenox   Code Status: Full Code Family Communication: no family at bedside Disposition Plan: SNF Reason for continuing need for hospitalization: on IV rocephin for UTI. Getting CXR for further workup on acute respiratory failure with hypoxia. Monitor HgB due to operative anemia.  Objective: Vitals:   08/29/24 1739 08/29/24 1940 08/30/24 0419 08/30/24 1001  BP: 105/64 109/68 116/63 111/70  Pulse: (!) 108 (!) 107 97 (!) 101  Resp: 16 18  18   Temp: 99.3 F (37.4 C) 98.3 F (36.8 C) 98.2 F (36.8 C) 98.5 F (36.9 C)  TempSrc: Oral Oral Oral Oral  SpO2: (!) 87% 91% (!) 84% 91%  Weight:      Height:        Intake/Output Summary (Last 24 hours) at 08/30/2024 1555 Last data filed at 08/30/2024 1400 Gross per 24 hour  Intake 413 ml  Output 2550 ml  Net -2137 ml   Filed Weights   08/27/24 2128  Weight: 127 kg    Examination:  Physical Exam Vitals and nursing note reviewed.  Constitutional:      General: She is not in acute distress.    Appearance: She is obese. She is not toxic-appearing.  HENT:     Head: Normocephalic.     Comments: Minor abrasions to her face    Nose: Nose normal.  Eyes:     General: No scleral icterus. Cardiovascular:     Rate and Rhythm: Normal rate.  Pulmonary:     Effort: Pulmonary effort is normal.     Breath sounds: Normal breath sounds.  Abdominal:      General: Bowel sounds are normal. There is no distension.     Palpations: Abdomen is soft.  Skin:    General: Skin is warm and dry.     Capillary Refill: Capillary refill takes less than 2 seconds.  Neurological:     Mental Status: She is alert and oriented to person, place, and time.     Data Reviewed: I have personally reviewed following labs and imaging studies  CBC: Recent Labs  Lab 08/27/24 2300 08/27/24 2351 08/28/24 0635 08/29/24 0228 08/30/24 0702  WBC  --  13.7* 14.7* 10.2 9.1  HGB 13.6 12.5 12.7 11.2* 10.3*  HCT 40.0 41.7 40.6 35.2* 32.1*  MCV  --  91.9 91.6 91.0 90.2  PLT  --  284 247 229 209   Basic Metabolic Panel: Recent Labs  Lab 08/27/24 2300 08/27/24 2351 08/29/24 0228  NA 141 141 137  K 3.8 3.8 4.2  CL 104 104 100  CO2  --  24 28  GLUCOSE 180* 171* 151*  BUN 16 15 10   CREATININE 0.70 0.63 0.71  CALCIUM   --  9.6 8.7*   GFR: Estimated Creatinine Clearance: 90.7 mL/min (by C-G formula based on SCr of 0.71 mg/dL). Liver Function Tests: Recent Labs  Lab 08/27/24 2351  AST 18  ALT 16  ALKPHOS 87  BILITOT 0.3  PROT 6.6  ALBUMIN 3.9   Coagulation Profile: Recent Labs  Lab 08/27/24 2351  INR 0.9   HbA1C: Recent Labs    08/28/24 0635  HGBA1C 6.8*   CBG: Recent Labs  Lab 08/29/24 2208 08/30/24 0146 08/30/24 0421 08/30/24 0806 08/30/24 1211  GLUCAP 157* 162* 147* 128* 151*   Sepsis Labs: Recent Labs  Lab 08/27/24 2301 08/28/24 0635 08/28/24 0700  PROCALCITON  --   --  <0.10  LATICACIDVEN 3.0* 3.3*  --     Recent Results (from the past 240 hours)  Urine Culture (for pregnant, neutropenic or urologic patients or patients with an indwelling urinary catheter)     Status: Abnormal   Collection Time: 08/28/24 12:50 AM   Specimen: Urine, Clean Catch  Result Value Ref Range Status   Specimen Description   Final    URINE, CLEAN CATCH Performed at [facility], [address], [place], [place] [address]     Special Requests   Final    NONE Performed at [facility], [address] [place], [place] [address]    Culture >=100,000 COLONIES/mL KLEBSIELLA PNEUMONIAE (A)  Final   Report Status 08/30/2024 FINAL  Final   Organism ID, Bacteria KLEBSIELLA PNEUMONIAE (A)  Final      Susceptibility   Klebsiella pneumoniae - MIC*    AMPICILLIN >=32 RESISTANT Resistant     CEFAZOLIN  (URINE) Value in next row Sensitive      2 SENSITIVEThis is a modified FDA-approved test that has been validated and its performance characteristics determined by the reporting laboratory.  This laboratory is certified under the Clinical Laboratory Improvement Amendments CLIA as qualified to perform high complexity clinical laboratory testing.    CEFEPIME Value in next row Sensitive      2 SENSITIVEThis is a modified FDA-approved test that has been validated and its performance characteristics determined by the reporting laboratory.  This laboratory is certified under the Clinical Laboratory Improvement Amendments CLIA as qualified to perform high complexity clinical laboratory testing.    ERTAPENEM Value in next row Sensitive      2 SENSITIVEThis is a modified FDA-approved test that has been validated and its performance characteristics determined by the reporting laboratory.  This laboratory is certified under the Clinical Laboratory Improvement Amendments CLIA as qualified to perform high complexity clinical laboratory testing.    CEFTRIAXONE Value in next row Sensitive      2 SENSITIVEThis is a modified FDA-approved test that has been validated and its performance characteristics determined by the reporting laboratory.  This laboratory is certified under the Clinical Laboratory Improvement Amendments CLIA as qualified to perform high complexity clinical laboratory testing.    CIPROFLOXACIN Value in next row Sensitive      2 SENSITIVEThis is a modified FDA-approved test that has been validated and its performance  characteristics determined by the reporting laboratory.  This laboratory is certified under the Clinical Laboratory Improvement Amendments CLIA as qualified to perform high complexity clinical laboratory testing.    GENTAMICIN Value in next row Sensitive  2 SENSITIVEThis is a modified FDA-approved test that has been validated and its performance characteristics determined by the reporting laboratory.  This laboratory is certified under the Clinical Laboratory Improvement Amendments CLIA as qualified to perform high complexity clinical laboratory testing.    NITROFURANTOIN  Value in next row Intermediate      2 SENSITIVEThis is a modified FDA-approved test that has been validated and its performance characteristics determined by the reporting laboratory.  This laboratory is certified under the Clinical Laboratory Improvement Amendments CLIA as qualified to perform high complexity clinical laboratory testing.    TRIMETH/SULFA Value in next row Sensitive      2 SENSITIVEThis is a modified FDA-approved test that has been validated and its performance characteristics determined by the reporting laboratory.  This laboratory is certified under the Clinical Laboratory Improvement Amendments CLIA as qualified to perform high complexity clinical laboratory testing.    AMPICILLIN/SULBACTAM Value in next row Sensitive      2 SENSITIVEThis is a modified FDA-approved test that has been validated and its performance characteristics determined by the reporting laboratory.  This laboratory is certified under the Clinical Laboratory Improvement Amendments CLIA as qualified to perform high complexity clinical laboratory testing.    PIP/TAZO Value in next row Sensitive      <=4 SENSITIVEThis is a modified FDA-approved test that has been validated and its performance characteristics determined by the reporting laboratory.  This laboratory is certified under the Clinical Laboratory Improvement Amendments CLIA as qualified to  perform high complexity clinical laboratory testing.    MEROPENEM Value in next row Sensitive      <=4 SENSITIVEThis is a modified FDA-approved test that has been validated and its performance characteristics determined by the reporting laboratory.  This laboratory is certified under the Clinical Laboratory Improvement Amendments CLIA as qualified to perform high complexity clinical laboratory testing.    * >=100,000 COLONIES/mL KLEBSIELLA PNEUMONIAE  Surgical pcr screen     Status: Abnormal   Collection Time: 08/28/24  2:17 PM   Specimen: Nasal Mucosa; Nasal Swab  Result Value Ref Range Status   MRSA, PCR POSITIVE (A) NEGATIVE Final    Comment: RESULT CALLED TO, READ BACK BY AND VERIFIED WITH: EMAILED LISA H. [id number] AT 2040, ADC    Staphylococcus aureus POSITIVE (A) NEGATIVE Final    Comment: (NOTE) The Xpert SA Assay (FDA approved for NASAL specimens in patients 51 years of age and older), is one component of a comprehensive surveillance program. It is not intended to diagnose infection nor to guide or monitor treatment. Performed at [facility], [address], [place], [place] [address]      Radiology Studies: DG FEMUR PORT, MIN 2 VIEWS RIGHT Result Date: 08/28/2024 CLINICAL DATA:  Postop femur fracture fixation. EXAM: RIGHT FEMUR PORTABLE 2 VIEW COMPARISON:  Preoperative imaging FINDINGS: Lateral plate and screw fixation of periprosthetic distal femur fracture. Decreased angulation from preoperative imaging. Previous knee and hip arthroplasty. Recent postsurgical change includes air and edema in the soft tissues. IMPRESSION: ORIF of periprosthetic distal femur fracture. Electronically Signed   By: Andrea Gasman M.D.   On: 08/28/2024 20:46   DG FEMUR, MIN 2 VIEWS RIGHT Result Date: 08/28/2024 CLINICAL DATA:  Elective surgery. EXAM: RIGHT FEMUR 2 VIEWS COMPARISON:  Preoperative imaging FINDINGS: Six fluoroscopic spot views of the femur submitted from the operating room. Plate  and screw fixation distal femur fracture. Previous knee arthroplasty. Fluoroscopy time 44.6 seconds. Dose 3.6214 mGy. IMPRESSION: Intraoperative fluoroscopy during distal femur fracture ORIF.  Electronically Signed   By: Andrea Gasman M.D.   On: 08/28/2024 20:43   DG C-Arm 1-60 Min-No Report Result Date: 08/28/2024 Fluoroscopy was utilized by the requesting physician.  No radiographic interpretation.   DG C-Arm 1-60 Min-No Report Result Date: 08/28/2024 Fluoroscopy was utilized by the requesting physician.  No radiographic interpretation.    Scheduled Meds:  docusate sodium   100 mg Oral BID   [START ON 08/31/2024] enoxaparin (LOVENOX) injection  60 mg Subcutaneous Q24H   fesoterodine   4 mg Oral Daily   insulin  aspart  0-9 Units Subcutaneous TID WC   levothyroxine   75 mcg Oral QAC breakfast   simvastatin   10 mg Oral QHS   Continuous Infusions:  cefTRIAXone (ROCEPHIN)  IV 2 g (08/30/24 1012)     LOS: 2 days   Time spent: 60 minutes  Camellia Door, DO  [facility]  08/30/2024, 3:55 PM

## 2024-08-30 NOTE — Subjective & Objective (Addendum)
 Pt seen and examined. She still has not met with CM/TOC yet about SNF. Urine cx growing klebsiella. Verified with pharmacy that pt has taken cephalosporins before. PT/OT recommending SNF at discharge.  Pt states she fell in her driveway after she slipped on some pecan nuts that had fallen off her neighbor's tree and onto her driveway. She doesn't mention anything about her alcohol consumption that evening.

## 2024-08-30 NOTE — Assessment & Plan Note (Addendum)
 08/30/24 continue zocor   08/31/24 stable on zocor .  09/01/24 on zocor .  09/02/24 continue zocor  10 mg qhs

## 2024-08-30 NOTE — Hospital Course (Addendum)
 CC: fall at home onto driveway HPI: Kelly Taylor is a 73 y.o. female with medical history significant of severe OSA on CPAP, mild obstructive airway disease, class III obesity (BMI 43.85), chronic back pain, type 2 diabetes, anemia, ascending aorta dilation, depression, GERD, hypothyroidism, IBS, vertigo, history of bilateral total knee arthroplasty, hyperlipidemia presenting the ED via EMS after a fall tonight.  Patient fell on her right side and hit her head and right knee.  Presented with swelling of right knee and swelling and laceration of lip, nosebleed.  She had 2 glasses of wine tonight.  She was given fentanyl  by EMS and oxygen saturation dropped to the high 80s requiring 2 L [place].  Labs notable for WBC count 13.7, glucose 171, lactic acid 3.0, ethanol level 31.  UA with positive nitrite, negative leukocytes, and microscopy showing 0-5 WBCs and many bacteria.  X-ray of right knee showing transverse fracture just above right knee prosthesis with posterior displacement of the distal femoral fracture.  X-rays of chest and right hip/pelvis showing no acute findings.  CT head/maxillofacial/C-spine and chest/abdomen/pelvis with contrast negative for acute traumatic findings.  Patient was given fentanyl , Dilaudid , Zofran , Compazine, and 1 L IV fluids in the ED.  Orthopedics consulted and [facility] called to admit.   Patient states she was in her usual state of health, feeling well.  She was walking on her driveway when she accidentally tripped on a paver and fell.  She reports falling on her right knee and hitting the right side of her face/head on floor.  Denies loss of consciousness.  She was not able to get up and walk due to severe pain in her right knee.  She feels nauseous due to severe pain whenever she tries to move her leg.  Denies vomiting or abdominal pain.  Denies chest pain or shortness of breath.  Denies any urinary symptoms.  Significant Events: Admitted 08/27/2024 for right periprosthetic femur  fracture 08-27-2024 seen by ortho consult 08-28-2024 taken to OR for ORIF right supracondylar distal femur fracture  Admission Labs: Na 141, K 3.8, CO2 of 24, BUN 15, scr 0.63, glu 171 T. Prot 6.6, alb 3.9, AST 18, ALT 16, alk phos 87, t. Bili 0.3 WBC 13.7, HgB 12.5, plt 284 ETOH 31 INR 0.9 UA spg >1.046, POSITIVE nitrite, negative LE, WBC 0-5, many bacteria Procalcitonin < 0.10  Admission Imaging Studies:   Significant Labs: Urine cx Klebsiella pneumonaie A1c of 6.8%  Significant Imaging Studies: 08-30-2024 CXR No active disease.   Antibiotic Therapy: Anti-infectives (From admission, onward)    Start     Dose/Rate Route Frequency Ordered Stop   08/29/24 0400  vancomycin  (VANCOCIN ) IVPB 1000 mg/200 mL premix        1,000 mg 200 mL/hr over 60 Minutes Intravenous Every 12 hours 08/29/24 0028 08/29/24 0716   08/28/24 1705  vancomycin  (VANCOCIN ) powder  Status:  Discontinued          As needed 08/28/24 1705 08/28/24 1752       Procedures: 08-28-2024 taken to OR for ORIF right supracondylar distal femur fracture  Consultants: ortho

## 2024-08-30 NOTE — Progress Notes (Signed)
 Orthopaedic Progress Note  S: The patient is resting comfortably in bed.  No complaints.  O:  Vitals:   08/30/24 0419 08/30/24 1001  BP: 116/63 111/70  Pulse: 97 (!) 101  Resp:  18  Temp: 98.2 F (36.8 C) 98.5 F (36.9 C)  SpO2: (!) 84% 91%    Clean dressing on the right leg.  Thigh and calf are both soft and compressible no focal tenderness.  Grossly neurologically intact with 5 out of 5 strength EHL FHL gastrocsoleus anterior grossly intact sensation in the saphenous, sural, tibial, peroneal nerve distributions    Labs:  Results for orders placed or performed during the hospital encounter of 08/27/24 (from the past 24 hours)  Glucose, capillary     Status: Abnormal   Collection Time: 08/29/24 12:09 PM  Result Value Ref Range   Glucose-Capillary 144 (H) 70 - 99 mg/dL   Comment 1 Notify RN   Glucose, capillary     Status: Abnormal   Collection Time: 08/29/24  4:29 PM  Result Value Ref Range   Glucose-Capillary 156 (H) 70 - 99 mg/dL   Comment 1 Notify RN   Glucose, capillary     Status: Abnormal   Collection Time: 08/29/24 10:08 PM  Result Value Ref Range   Glucose-Capillary 157 (H) 70 - 99 mg/dL  Glucose, capillary     Status: Abnormal   Collection Time: 08/30/24  1:46 AM  Result Value Ref Range   Glucose-Capillary 162 (H) 70 - 99 mg/dL  Glucose, capillary     Status: Abnormal   Collection Time: 08/30/24  4:21 AM  Result Value Ref Range   Glucose-Capillary 147 (H) 70 - 99 mg/dL  CBC     Status: Abnormal   Collection Time: 08/30/24  7:02 AM  Result Value Ref Range   WBC 9.1 4.0 - 10.5 K/uL   RBC 3.56 (L) 3.87 - 5.11 MIL/uL   Hemoglobin 10.3 (L) 12.0 - 15.0 g/dL   HCT 67.8 (L) 63.9 - 53.9 %   MCV 90.2 80.0 - 100.0 fL   MCH 28.9 26.0 - 34.0 pg   MCHC 32.1 30.0 - 36.0 g/dL   RDW 86.6 88.4 - 84.4 %   Platelets 209 150 - 400 K/uL   nRBC 0.0 0.0 - 0.2 %  Glucose, capillary     Status: Abnormal   Collection Time: 08/30/24  8:06 AM  Result Value Ref Range    Glucose-Capillary 128 (H) 70 - 99 mg/dL    Assessment: Postop day 2 status post ORIF of right distal femur  The patient is doing well.  She can be mobilized with physical therapy.  She can to be weightbearing as tolerated.  Will change her dressing today.  She will continue Lovenox for DVT prophylaxis.  From an orthopedic standpoint, she be discharged to rehab when approved with medicine and with therapy.  She will follow-up with Dr. Kendal in 2 weeks time    Injuries: right distal femur periprosthetic fracture  Weightbearing: Weightbearing as tolerated  Insicional and dressing care: Dressing change today  VTE prophylaxis: Lovenox   Cordella Rhein, MD, MS [facility] [phone]

## 2024-08-30 NOTE — Assessment & Plan Note (Addendum)
 08/30/24 HgB 10.3 g/dl. Preop HgB of 13.6. continue to monitor.   08/31/24 repeat CBC in AM.  09/01/24 HgB 10 g/dl. Stable.  09/02/24 stable. HgB @ 10 g/dl.

## 2024-08-30 NOTE — Assessment & Plan Note (Addendum)
 08/30/24 start IV rocephin. Pharmacy has verified that pt has taken cephalosporins in the past without difficulty.  08/31/24 continue with IV Rocephin. Day #2  09/01/24 Klebsiella sensitive to Ancef . Change to po duricef.  09/02/24 tolerating Duricef. Complete 3 more days. Total of 5 days of abx therapy. Susceptibility data from last 90 days. Collected Specimen Info Organism AMPICILLIN AMPICILLIN/SULBACTAM CEFAZOLIN  (URINE) CEFEPIME CEFTRIAXONE Ciprofloxacin Ertapenem Gentamicin Susc lslt Meropenem Nitrofurantoin  Susc lslt Piperacillin + Tazobactam  08/28/24 Urine, Clean Catch Klebsiella pneumoniae  R  S S  S  S  S  S  S  S  I S  07/04/24 Urine, Clean Catch Escherichia coli  S  S S  S  S  S  S  S  S  S S   Collected Specimen Info Organism Trimethoprim/Sulfa  08/28/24 Urine, Clean Catch Klebsiella pneumoniae  S  07/04/24 Urine, Clean Catch Escherichia coli  S

## 2024-08-30 NOTE — Assessment & Plan Note (Addendum)
 08/30/24 on SSI.  08/31/24 continue SSI. CBG acceptable ranges.  09/01/24 stable.  09/02/24 restart metformin  at discharge. 500 mg bid.

## 2024-08-31 DIAGNOSIS — M978XXA Periprosthetic fracture around other internal prosthetic joint, initial encounter: Secondary | ICD-10-CM | POA: Diagnosis not present

## 2024-08-31 DIAGNOSIS — N390 Urinary tract infection, site not specified: Secondary | ICD-10-CM | POA: Diagnosis not present

## 2024-08-31 DIAGNOSIS — D62 Acute posthemorrhagic anemia: Secondary | ICD-10-CM | POA: Diagnosis not present

## 2024-08-31 DIAGNOSIS — J9601 Acute respiratory failure with hypoxia: Secondary | ICD-10-CM | POA: Diagnosis not present

## 2024-08-31 LAB — GLUCOSE, CAPILLARY
Glucose-Capillary: 160 mg/dL — ABNORMAL HIGH (ref 70–99)
Glucose-Capillary: 157 mg/dL — ABNORMAL HIGH (ref 70–99)
Glucose-Capillary: 159 mg/dL — ABNORMAL HIGH (ref 70–99)
Glucose-Capillary: 133 mg/dL — ABNORMAL HIGH (ref 70–99)

## 2024-08-31 NOTE — Plan of Care (Signed)
   Problem: Education: Goal: Ability to describe self-care measures that may prevent or decrease complications (Diabetes Survival Skills Education) will improve Outcome: Progressing   Problem: Coping: Goal: Ability to adjust to condition or change in health will improve Outcome: Progressing   Problem: Fluid Volume: Goal: Ability to maintain a balanced intake and output will improve Outcome: Progressing

## 2024-08-31 NOTE — TOC Initial Note (Signed)
 Transition of Care [facility]) - Initial/Assessment Note    Patient Details  Name: Kelly Taylor MRN: [id number] Date of Birth: 07/02/1951  [facility] of [facility] CM/SW Contact:    Jeoffrey LITTIE Moose, [place] Phone Number: 08/31/2024, 9:55 AM  Clinical Narrative:                 Pt admitted from home due to a fall in her driveway. CSW completed SNF workup with pt at bedside. Pt agreeable to SNF following discharge from the hospital and has never been to SNF before. CSW completed Fl2 and sent out referrals. CSW will follow up to provide pt with bed offers.   Expected Discharge Plan: Skilled Nursing Facility Barriers to Discharge: Continued Medical Work up, SNF Pending bed offer   Patient Goals and CMS Choice Patient states their goals for this hospitalization and ongoing recovery are:: SNF          Expected Discharge Plan and Services       Living arrangements for the past 2 months: Single Family Home                                      Prior Living Arrangements/Services Living arrangements for the past 2 months: Single Family Home Lives with:: Self Patient language and need for interpreter reviewed:: Yes Do you feel safe going back to the place where you live?: Yes      Need for Family Participation in Patient Care: No (Comment) Care giver support system in place?: Yes (comment)   Criminal Activity/Legal Involvement Pertinent to Current Situation/Hospitalization: No - Comment as needed  Activities of Daily Living      Permission Sought/Granted Permission sought to share information with : Facility Medical sales representative, Family Supports    Share Information with NAME: Charlie  Permission granted to share info w AGENCY: SNFs  Permission granted to share info w Relationship: brother  Permission granted to share info w Contact Information: [phone]  Emotional Assessment Appearance:: Appears stated age Attitude/Demeanor/Rapport: Engaged Affect (typically  observed): Accepting Orientation: : Oriented to Self, Oriented to Place, Oriented to  Time, Oriented to Situation Alcohol / Substance Use: Not Applicable Psych Involvement: No (comment)  Admission diagnosis:  Peri-prosthetic fracture of shaft of femur [F02.8XXA, Z96.649] Fracture of prosthetic knee, initial encounter [T84.019A] Closed fracture of distal end of right femur, initial encounter (HCC) [S72.401A] Aneurysm of ascending aorta without rupture [I71.21] Patient Active Problem List   Diagnosis Date Noted   UTI due to Klebsiella species 08/30/2024   Acute respiratory failure with hypoxia (HCC) 08/30/2024   Peri-prosthetic fracture of shaft of femur - right 08/28/2024   Leukocytosis 08/28/2024   Ascending aorta dilatation 09/14/2022   Spinal stenosis of lumbar region 08/31/2022   Asthma 08/02/2022   Other fatigue 07/18/2022   DOE (dyspnea on exertion) 07/18/2022   Vitamin D  deficiency 07/18/2022   Other hyperlipidemia 07/18/2022   Right axis deviation on EKG 07/18/2022   Morbid obesity (HCC) 01/16/2018   Type 2 diabetes mellitus with other specified complication (HCC) 01/16/2018   Disease of thyroid  gland 08/03/2017   GERD (gastroesophageal reflux disease) 08/03/2017   IBS (irritable bowel syndrome) 08/03/2017   Urge incontinence 08/03/2017   Postoperative anemia due to acute blood loss 04/08/2013   OA (osteoarthritis) of knee 04/07/2013   PCP:  Gerome Brunet, DO Pharmacy:   [facility] [address], [place] - [address] [place] [address] Phone: [phone] Fax: [phone]     Social Drivers of Health (SDOH) Social History: SDOH Screenings   Food Insecurity: Food Insecurity Present (08/30/2024)  Housing: Low Risk  (08/30/2024)  Transportation Needs: No Transportation Needs (08/30/2024)  Utilities: Not At Risk (08/30/2024)  Depression (PHQ2-9): Medium Risk (07/18/2022)  Financial Resource Strain: Low Risk  (06/17/2024)    Received from [facility]  Social Connections: Socially Isolated (08/30/2024)  Tobacco Use: Low Risk  (08/28/2024)   SDOH Interventions:     Readmission Risk Interventions     No data to display

## 2024-08-31 NOTE — NC FL2 (Signed)
 [place]  MEDICAID FL2 LEVEL OF CARE FORM     IDENTIFICATION  Patient Name: Kelly Taylor Birthdate: 1951-01-26 Sex: female Admission Date (Current Location): 08/27/2024  [facility] and [place] Number:  Producer, television/film/video and Address:  [facility] [place], [address], [place], [place] [address]      Provider Number: [id number]  Attending Physician Name and Address:  Laurence Locus, DO  Relative Name and Phone Number:       Current Level of Care: Hospital Recommended Level of Care: Skilled Nursing Facility Prior Approval Number:    Date Approved/Denied:   PASRR Number: [id number]  Discharge Plan: SNF    Current Diagnoses: Patient Active Problem List   Diagnosis Date Noted   UTI due to Klebsiella species 08/30/2024   Acute respiratory failure with hypoxia (HCC) 08/30/2024   Peri-prosthetic fracture of shaft of femur - right 08/28/2024   Leukocytosis 08/28/2024   Ascending aorta dilatation 09/14/2022   Spinal stenosis of lumbar region 08/31/2022   Asthma 08/02/2022   Other fatigue 07/18/2022   DOE (dyspnea on exertion) 07/18/2022   Vitamin D  deficiency 07/18/2022   Other hyperlipidemia 07/18/2022   Right axis deviation on EKG 07/18/2022   Morbid obesity (HCC) 01/16/2018   Type 2 diabetes mellitus with other specified complication (HCC) 01/16/2018   Disease of thyroid  gland 08/03/2017   GERD (gastroesophageal reflux disease) 08/03/2017   IBS (irritable bowel syndrome) 08/03/2017   Urge incontinence 08/03/2017   Postoperative anemia due to acute blood loss 04/08/2013   OA (osteoarthritis) of knee 04/07/2013    Orientation RESPIRATION BLADDER Height & Weight     Self, Time, Situation, Place  Normal Continent Weight: 280 lb (127 kg) Height:  5' 7 (170.2 cm)  BEHAVIORAL SYMPTOMS/MOOD NEUROLOGICAL BOWEL NUTRITION STATUS      Continent Diet (See DC Summary)  AMBULATORY STATUS COMMUNICATION OF NEEDS Skin   Extensive Assist Verbally Other (Comment)  (closed surgical incision on leg)                       Personal Care Assistance Level of Assistance  Bathing, Feeding, Dressing Bathing Assistance: Maximum assistance Feeding assistance: Independent Dressing Assistance: Maximum assistance     Functional Limitations Info  Sight, Speech, Hearing Sight Info: Adequate Hearing Info: Adequate Speech Info: Adequate    SPECIAL CARE FACTORS FREQUENCY  PT (By licensed PT), OT (By licensed OT)     PT Frequency: 5x/week OT Frequency: 5x/week            Contractures Contractures Info: Not present    Additional Factors Info  Code Status, Allergies Code Status Info: Full Allergies Info: Oxycodone ; Penicillins; Trazodone; Lyrica (pregabalin); Nabumetone; Sulfa Antibiotics           Current Medications (08/31/2024):  This is the current hospital active medication list Current Facility-Administered Medications  Medication Dose Route Frequency Provider Last Rate Last Admin   acetaminophen  (TYLENOL ) tablet 1,000 mg  1,000 mg Oral Q6H PRN Laurence Locus, DO       bisacodyl  (DULCOLAX) EC tablet 5 mg  5 mg Oral Daily PRN Danton Domino A, PA-C   5 mg at 08/29/24 0631   cefTRIAXone (ROCEPHIN) 2 g in sodium chloride  0.9 % 100 mL IVPB  2 g Intravenous Q24H Laurence Locus, DO   Stopped at 08/30/24 1042   diphenhydrAMINE  (BENADRYL ) 12.5 MG/5ML elixir 12.5-25 mg  12.5-25 mg Oral Q4H PRN Danton Domino A, PA-C       docusate  sodium (COLACE) capsule 100 mg  100 mg Oral BID Danton Lauraine LABOR, PA-C   100 mg at 08/30/24 2043   enoxaparin (LOVENOX) injection 60 mg  60 mg Subcutaneous Q24H Laurence Locus, DO       fesoterodine  (TOVIAZ ) tablet 4 mg  4 mg Oral Daily Danton Lauraine LABOR, PA-C   4 mg at 08/30/24 1007   HYDROcodone -acetaminophen  (NORCO) 7.5-325 MG per tablet 1-2 tablet  1-2 tablet Oral Q4H PRN Danton Lauraine LABOR, PA-C   2 tablet at 08/30/24 0201   HYDROcodone -acetaminophen  (NORCO/VICODIN) 5-325 MG per tablet 1-2 tablet  1-2 tablet Oral Q4H PRN Danton Lauraine LABOR, PA-C   2 tablet at 08/30/24 2041   insulin  aspart (novoLOG ) injection 0-9 Units  0-9 Units Subcutaneous TID WC Laurence Locus, DO   1 Units at 08/30/24 1735   levothyroxine  (SYNTHROID ) tablet 75 mcg  75 mcg Oral QAC breakfast Danton Lauraine LABOR, PA-C   75 mcg at 08/31/24 0630   magnesium  citrate solution 1 Bottle  1 Bottle Oral Once PRN Danton Lauraine LABOR, PA-C       methocarbamol  (ROBAXIN ) tablet 500 mg  500 mg Oral Q6H PRN Danton Lauraine LABOR, PA-C       Or   methocarbamol  (ROBAXIN ) injection 500 mg  500 mg Intravenous Q6H PRN Danton Lauraine LABOR, PA-C       metoCLOPramide  (REGLAN ) tablet 5-10 mg  5-10 mg Oral Q8H PRN Danton, Sarah A, PA-C       Or   metoCLOPramide  (REGLAN ) injection 5-10 mg  5-10 mg Intravenous Q8H PRN Danton Lauraine LABOR, PA-C       morphine  (PF) 2 MG/ML injection 0.5-1 mg  0.5-1 mg Intravenous Q2H PRN Danton Lauraine LABOR, PA-C       naloxone (NARCAN) injection 0.4 mg  0.4 mg Intravenous PRN Danton Lauraine LABOR, PA-C       ondansetron  (ZOFRAN ) tablet 4 mg  4 mg Oral Q6H PRN Danton Lauraine LABOR, PA-C       Or   ondansetron  (ZOFRAN ) injection 4 mg  4 mg Intravenous Q6H PRN McClung, Sarah A, PA-C       polyethylene glycol (MIRALAX  / GLYCOLAX ) packet 17 g  17 g Oral Daily PRN Danton Lauraine LABOR, PA-C   17 g at 08/30/24 2044   simvastatin  (ZOCOR ) tablet 10 mg  10 mg Oral QHS Danton Lauraine LABOR, PA-C   10 mg at 08/30/24 2046     Discharge Medications: Please see discharge summary for a list of discharge medications.  Relevant Imaging Results:  Relevant Lab Results:   Additional Information SSN:[id number]  Jeoffrey LITTIE Moose, LCSWA

## 2024-08-31 NOTE — Progress Notes (Signed)
 PROGRESS NOTE    ANWITA MENCER  FMW:[id number] DOB: Oct 19, 1951 DOA: 08/27/2024 PCP: Gerome Brunet, DO  Subjective: Pt seen and examined. Pt to meet with CM/TOC to pick out SNF. She has medicare A/B. Still on O2. CXR this AM negative for pneumonia. Needs good pulmonary toileting.   Hospital Course: CC: fall at home onto driveway HPI: VOLANDA MANGINE is a 73 y.o. female with medical history significant of severe OSA on CPAP, mild obstructive airway disease, class III obesity (BMI 43.85), chronic back pain, type 2 diabetes, anemia, ascending aorta dilation, depression, GERD, hypothyroidism, IBS, vertigo, history of bilateral total knee arthroplasty, hyperlipidemia presenting the ED via EMS after a fall tonight.  Patient fell on her right side and hit her head and right knee.  Presented with swelling of right knee and swelling and laceration of lip, nosebleed.  She had 2 glasses of wine tonight.  She was given fentanyl  by EMS and oxygen saturation dropped to the high 80s requiring 2 L [place].  Labs notable for WBC count 13.7, glucose 171, lactic acid 3.0, ethanol level 31.  UA with positive nitrite, negative leukocytes, and microscopy showing 0-5 WBCs and many bacteria.  X-ray of right knee showing transverse fracture just above right knee prosthesis with posterior displacement of the distal femoral fracture.  X-rays of chest and right hip/pelvis showing no acute findings.  CT head/maxillofacial/C-spine and chest/abdomen/pelvis with contrast negative for acute traumatic findings.  Patient was given fentanyl , Dilaudid , Zofran , Compazine, and 1 L IV fluids in the ED.  Orthopedics consulted and [facility] called to admit.   Patient states she was in her usual state of health, feeling well.  She was walking on her driveway when she accidentally tripped on a paver and fell.  She reports falling on her right knee and hitting the right side of her face/head on floor.  Denies loss of consciousness.  She was not able to get up  and walk due to severe pain in her right knee.  She feels nauseous due to severe pain whenever she tries to move her leg.  Denies vomiting or abdominal pain.  Denies chest pain or shortness of breath.  Denies any urinary symptoms.  Significant Events: Admitted 08/27/2024 for right periprosthetic femur fracture 08-27-2024 seen by ortho consult 08-28-2024 taken to OR for ORIF right supracondylar distal femur fracture  Admission Labs: Na 141, K 3.8, CO2 of 24, BUN 15, scr 0.63, glu 171 T. Prot 6.6, alb 3.9, AST 18, ALT 16, alk phos 87, t. Bili 0.3 WBC 13.7, HgB 12.5, plt 284 ETOH 31 INR 0.9 UA spg >1.046, POSITIVE nitrite, negative LE, WBC 0-5, many bacteria Procalcitonin < 0.10  Admission Imaging Studies:   Significant Labs: Urine cx Klebsiella pneumonaie A1c of 6.8%  Significant Imaging Studies:   Antibiotic Therapy: Anti-infectives (From admission, onward)    Start     Dose/Rate Route Frequency Ordered Stop   08/29/24 0400  vancomycin  (VANCOCIN ) IVPB 1000 mg/200 mL premix        1,000 mg 200 mL/hr over 60 Minutes Intravenous Every 12 hours 08/29/24 0028 08/29/24 0716   08/28/24 1705  vancomycin  (VANCOCIN ) powder  Status:  Discontinued          As needed 08/28/24 1705 08/28/24 1752       Procedures: 08-28-2024 taken to OR for ORIF right supracondylar distal femur fracture  Consultants: ortho    Assessment and Plan: * Peri-prosthetic fracture of shaft of femur - right 08/30/24 POD #2 from ORIF  of peri-prosthetic fracture. Has seen PT/OT. Needs to see CM/TOC about SNF placement.  08/31/24 POD #3. Needs SNF. CM/TOC consulted to help with SNF placement. Pt has traditional medicare A/B.    Acute respiratory failure with hypoxia (HCC) 08/30/24 initial CXR clear. Repeat CXR. Pulmonary toileting.  08/31/24 last night CXR negative for pneumonia. Has been on lovenox for DVT prophylaxis since admission. Doubt PE. Pulmonary toileting. Wean O2 to RA. Confirmed with pt that she  does not use home O2.    UTI due to Klebsiella species 08/30/24 start IV rocephin. Pharmacy has verified that pt has taken cephalosporins in the past without difficulty.  08/31/24 continue with IV Rocephin. Day #2    Postoperative anemia due to acute blood loss 08/30/24 HgB 10.3 g/dl. Preop HgB of 13.6. continue to monitor.   08/31/24 repeat CBC in AM.    Leukocytosis-resolved as of 08/31/2024 08/30/24 resolved. likely due to acute fracture, UTI.    Type 2 diabetes mellitus with other specified complication (HCC) 08/30/24 on SSI.  08/31/24 continue SSI. CBG acceptable ranges.    Other hyperlipidemia 08/30/24 continue zocor   08/31/24 stable on zocor .   Morbid obesity (HCC) 08/30/24 Body mass index is 43.85 kg/m.   DVT prophylaxis: SCDs Start: 08/29/24 0029 SCDs Start: 08/28/24 9386    Code Status: Full Code Family Communication: no family at bedside. Pt is decisional. Disposition Plan: SNF Reason for continuing need for hospitalization: medically stable for DC to SNF. CM/TOC assisting with SNF placement.  Objective: Vitals:   08/30/24 1001 08/30/24 1716 08/30/24 2136 08/31/24 0906  BP: 111/70 111/65 137/83 102/66  Pulse: (!) 101 98 95 89  Resp: 18 18 18 18   Temp: 98.5 F (36.9 C) 98.4 F (36.9 C) 99.4 F (37.4 C) 99 F (37.2 C)  TempSrc: Oral Oral    SpO2: 91% 94% 92% 95%  Weight:      Height:        Intake/Output Summary (Last 24 hours) at 08/31/2024 1331 Last data filed at 08/30/2024 1800 Gross per 24 hour  Intake 454 ml  Output 750 ml  Net -296 ml   Filed Weights   08/27/24 2128  Weight: 127 kg    Examination:  Physical Exam Vitals and nursing note reviewed.  Constitutional:      Appearance: She is obese.  HENT:     Head: Normocephalic and atraumatic.  Eyes:     General: No scleral icterus. Cardiovascular:     Rate and Rhythm: Normal rate and regular rhythm.  Pulmonary:     Effort: Pulmonary effort is normal.     Breath  sounds: Normal breath sounds.  Abdominal:     General: Abdomen is protuberant. Bowel sounds are normal. There is no distension.     Palpations: Abdomen is soft.  Skin:    General: Skin is warm and dry.     Capillary Refill: Capillary refill takes less than 2 seconds.  Neurological:     General: No focal deficit present.     Mental Status: She is alert and oriented to person, place, and time.     Data Reviewed: I have personally reviewed following labs and imaging studies  CBC: Recent Labs  Lab 08/27/24 2300 08/27/24 2351 08/28/24 0635 08/29/24 0228 08/30/24 0702  WBC  --  13.7* 14.7* 10.2 9.1  HGB 13.6 12.5 12.7 11.2* 10.3*  HCT 40.0 41.7 40.6 35.2* 32.1*  MCV  --  91.9 91.6 91.0 90.2  PLT  --  284 247 229 209  Basic Metabolic Panel: Recent Labs  Lab 08/27/24 2300 08/27/24 2351 08/29/24 0228  NA 141 141 137  K 3.8 3.8 4.2  CL 104 104 100  CO2  --  24 28  GLUCOSE 180* 171* 151*  BUN 16 15 10   CREATININE 0.70 0.63 0.71  CALCIUM   --  9.6 8.7*   GFR: Estimated Creatinine Clearance: 90.7 mL/min (by C-G formula based on SCr of 0.71 mg/dL). Liver Function Tests: Recent Labs  Lab 08/27/24 2351  AST 18  ALT 16  ALKPHOS 87  BILITOT 0.3  PROT 6.6  ALBUMIN 3.9   Coagulation Profile: Recent Labs  Lab 08/27/24 2351  INR 0.9   CBG: Recent Labs  Lab 08/30/24 1211 08/30/24 1655 08/30/24 2131 08/31/24 0734 08/31/24 1133  GLUCAP 151* 136* 165* 160* 157*   Sepsis Labs: Recent Labs  Lab 08/27/24 2301 08/28/24 0635 08/28/24 0700  PROCALCITON  --   --  <0.10  LATICACIDVEN 3.0* 3.3*  --     Recent Results (from the past 240 hours)  Urine Culture (for pregnant, neutropenic or urologic patients or patients with an indwelling urinary catheter)     Status: Abnormal   Collection Time: 08/28/24 12:50 AM   Specimen: Urine, Clean Catch  Result Value Ref Range Status   Specimen Description   Final    URINE, CLEAN CATCH Performed at [facility], [address] [place], [place] [address]    Special Requests   Final    NONE Performed at [facility], [address], [place], [place] [address]    Culture >=100,000 COLONIES/mL KLEBSIELLA PNEUMONIAE (A)  Final   Report Status 08/30/2024 FINAL  Final   Organism ID, Bacteria KLEBSIELLA PNEUMONIAE (A)  Final      Susceptibility   Klebsiella pneumoniae - MIC*    AMPICILLIN >=32 RESISTANT Resistant     CEFAZOLIN  (URINE) Value in next row Sensitive      2 SENSITIVEThis is a modified FDA-approved test that has been validated and its performance characteristics determined by the reporting laboratory.  This laboratory is certified under the Clinical Laboratory Improvement Amendments CLIA as qualified to perform high complexity clinical laboratory testing.    CEFEPIME Value in next row Sensitive      2 SENSITIVEThis is a modified FDA-approved test that has been validated and its performance characteristics determined by the reporting laboratory.  This laboratory is certified under the Clinical Laboratory Improvement Amendments CLIA as qualified to perform high complexity clinical laboratory testing.    ERTAPENEM Value in next row Sensitive      2 SENSITIVEThis is a modified FDA-approved test that has been validated and its performance characteristics determined by the reporting laboratory.  This laboratory is certified under the Clinical Laboratory Improvement Amendments CLIA as qualified to perform high complexity clinical laboratory testing.    CEFTRIAXONE Value in next row Sensitive      2 SENSITIVEThis is a modified FDA-approved test that has been validated and its performance characteristics determined by the reporting laboratory.  This laboratory is certified under the Clinical Laboratory Improvement Amendments CLIA as qualified to perform high complexity clinical laboratory testing.    CIPROFLOXACIN Value in next row Sensitive      2 SENSITIVEThis is a modified  FDA-approved test that has been validated and its performance characteristics determined by the reporting laboratory.  This laboratory is certified under the Clinical Laboratory Improvement Amendments CLIA as qualified to perform high complexity clinical laboratory testing.  GENTAMICIN Value in next row Sensitive      2 SENSITIVEThis is a modified FDA-approved test that has been validated and its performance characteristics determined by the reporting laboratory.  This laboratory is certified under the Clinical Laboratory Improvement Amendments CLIA as qualified to perform high complexity clinical laboratory testing.    NITROFURANTOIN  Value in next row Intermediate      2 SENSITIVEThis is a modified FDA-approved test that has been validated and its performance characteristics determined by the reporting laboratory.  This laboratory is certified under the Clinical Laboratory Improvement Amendments CLIA as qualified to perform high complexity clinical laboratory testing.    TRIMETH/SULFA Value in next row Sensitive      2 SENSITIVEThis is a modified FDA-approved test that has been validated and its performance characteristics determined by the reporting laboratory.  This laboratory is certified under the Clinical Laboratory Improvement Amendments CLIA as qualified to perform high complexity clinical laboratory testing.    AMPICILLIN/SULBACTAM Value in next row Sensitive      2 SENSITIVEThis is a modified FDA-approved test that has been validated and its performance characteristics determined by the reporting laboratory.  This laboratory is certified under the Clinical Laboratory Improvement Amendments CLIA as qualified to perform high complexity clinical laboratory testing.    PIP/TAZO Value in next row Sensitive      <=4 SENSITIVEThis is a modified FDA-approved test that has been validated and its performance characteristics determined by the reporting laboratory.  This laboratory is certified under the  Clinical Laboratory Improvement Amendments CLIA as qualified to perform high complexity clinical laboratory testing.    MEROPENEM Value in next row Sensitive      <=4 SENSITIVEThis is a modified FDA-approved test that has been validated and its performance characteristics determined by the reporting laboratory.  This laboratory is certified under the Clinical Laboratory Improvement Amendments CLIA as qualified to perform high complexity clinical laboratory testing.    * >=100,000 COLONIES/mL KLEBSIELLA PNEUMONIAE  Surgical pcr screen     Status: Abnormal   Collection Time: 08/28/24  2:17 PM   Specimen: Nasal Mucosa; Nasal Swab  Result Value Ref Range Status   MRSA, PCR POSITIVE (A) NEGATIVE Final    Comment: RESULT CALLED TO, READ BACK BY AND VERIFIED WITH: EMAILED LISA H. [id number] AT 2040, ADC    Staphylococcus aureus POSITIVE (A) NEGATIVE Final    Comment: (NOTE) The Xpert SA Assay (FDA approved for NASAL specimens in patients 65 years of age and older), is one component of a comprehensive surveillance program. It is not intended to diagnose infection nor to guide or monitor treatment. Performed at [facility], [address], [place], [place] [address]      Radiology Studies: DG CHEST PORT 1 VIEW Result Date: 08/30/2024 CLINICAL DATA:  Hypoxia EXAM: PORTABLE CHEST 1 VIEW COMPARISON:  08/28/2019 FINDINGS: Cardiac shadow is stable. Tortuous thoracic aorta is seen. Lungs are clear. No bony abnormality is noted. Surgical changes in the shoulders are noted bilaterally. IMPRESSION: No active disease. Electronically Signed   By: Oneil Devonshire M.D.   On: 08/30/2024 20:08    Scheduled Meds:  docusate sodium   100 mg Oral BID   enoxaparin (LOVENOX) injection  60 mg Subcutaneous Q24H   fesoterodine   4 mg Oral Daily   insulin  aspart  0-9 Units Subcutaneous TID WC   levothyroxine   75 mcg Oral QAC breakfast   simvastatin   10 mg Oral QHS   Continuous Infusions:  cefTRIAXone (ROCEPHIN)   IV 2  g (08/31/24 1202)     LOS: 3 days   Time spent: 55 minutes  Camellia Door, DO  [facility]  08/31/2024, 1:31 PM

## 2024-09-01 DIAGNOSIS — D62 Acute posthemorrhagic anemia: Secondary | ICD-10-CM | POA: Diagnosis not present

## 2024-09-01 DIAGNOSIS — M978XXA Periprosthetic fracture around other internal prosthetic joint, initial encounter: Secondary | ICD-10-CM | POA: Diagnosis not present

## 2024-09-01 DIAGNOSIS — J9601 Acute respiratory failure with hypoxia: Secondary | ICD-10-CM | POA: Diagnosis not present

## 2024-09-01 DIAGNOSIS — G4733 Obstructive sleep apnea (adult) (pediatric): Secondary | ICD-10-CM

## 2024-09-01 DIAGNOSIS — E1169 Type 2 diabetes mellitus with other specified complication: Secondary | ICD-10-CM

## 2024-09-01 DIAGNOSIS — N390 Urinary tract infection, site not specified: Secondary | ICD-10-CM | POA: Diagnosis not present

## 2024-09-01 LAB — CBC WITH DIFFERENTIAL/PLATELET
Abs Immature Granulocytes: 0.05 K/uL (ref 0.00–0.07)
Basophils Absolute: 0 K/uL (ref 0.0–0.1)
Basophils Relative: 0 %
Eosinophils Absolute: 0.2 K/uL (ref 0.0–0.5)
Eosinophils Relative: 2 %
HCT: 31 % — ABNORMAL LOW (ref 36.0–46.0)
Hemoglobin: 10 g/dL — ABNORMAL LOW (ref 12.0–15.0)
Immature Granulocytes: 1 %
Lymphocytes Relative: 26 %
Lymphs Abs: 2.3 K/uL (ref 0.7–4.0)
MCH: 29.2 pg (ref 26.0–34.0)
MCHC: 32.3 g/dL (ref 30.0–36.0)
MCV: 90.4 fL (ref 80.0–100.0)
Monocytes Absolute: 0.6 K/uL (ref 0.1–1.0)
Monocytes Relative: 6 %
Neutro Abs: 5.9 K/uL (ref 1.7–7.7)
Neutrophils Relative %: 65 %
Platelets: 215 K/uL (ref 150–400)
RBC: 3.43 MIL/uL — ABNORMAL LOW (ref 3.87–5.11)
RDW: 13.5 % (ref 11.5–15.5)
WBC: 9.1 K/uL (ref 4.0–10.5)
nRBC: 0 % (ref 0.0–0.2)

## 2024-09-01 LAB — COMPREHENSIVE METABOLIC PANEL WITH GFR
ALT: 35 U/L (ref 0–44)
AST: 19 U/L (ref 15–41)
Albumin: 2.5 g/dL — ABNORMAL LOW (ref 3.5–5.0)
Alkaline Phosphatase: 106 U/L (ref 38–126)
Anion gap: 9 (ref 5–15)
BUN: 11 mg/dL (ref 8–23)
CO2: 29 mmol/L (ref 22–32)
Calcium: 8.6 mg/dL — ABNORMAL LOW (ref 8.9–10.3)
Chloride: 99 mmol/L (ref 98–111)
Creatinine, Ser: 0.55 mg/dL (ref 0.44–1.00)
GFR, Estimated: >60 mL/min (ref >60)
Glucose, Bld: 132 mg/dL — ABNORMAL HIGH (ref 70–99)
Potassium: 3.8 mmol/L (ref 3.5–5.1)
Sodium: 137 mmol/L (ref 135–145)
Total Bilirubin: 0.6 mg/dL (ref 0.0–1.2)
Total Protein: 5.7 g/dL — ABNORMAL LOW (ref 6.5–8.1)

## 2024-09-01 LAB — GLUCOSE, CAPILLARY
Glucose-Capillary: 140 mg/dL — ABNORMAL HIGH (ref 70–99)
Glucose-Capillary: 145 mg/dL — ABNORMAL HIGH (ref 70–99)
Glucose-Capillary: 132 mg/dL — ABNORMAL HIGH (ref 70–99)

## 2024-09-01 MED ORDER — METHOCARBAMOL 500 MG PO TABS: 500.0000 mg | ORAL_TABLET | Freq: Four times a day (QID) | ORAL | 0 refills | Status: AC | PRN

## 2024-09-01 MED ORDER — APIXABAN 2.5 MG PO TABS: 2.5000 mg | ORAL_TABLET | Freq: Two times a day (BID) | ORAL | 0 refills | Status: AC

## 2024-09-01 MED ORDER — MUPIROCIN 2 % EX OINT
TOPICAL_OINTMENT | Freq: Two times a day (BID) | CUTANEOUS | Status: DC
  Filled 2024-09-01: qty 22

## 2024-09-01 MED ORDER — LACTULOSE 10 GM/15ML PO SOLN
20.0000 g | Freq: Every day | ORAL | Status: DC
  Administered 2024-09-01 – 2024-09-02 (×2): 20 g via ORAL
  Filled 2024-09-01 (×2): qty 30

## 2024-09-01 MED ORDER — CHLORHEXIDINE GLUCONATE CLOTH 2 % EX PADS
6.0000 | MEDICATED_PAD | Freq: Every day | CUTANEOUS | Status: DC
  Administered 2024-09-01 – 2024-09-02 (×2): 6 via TOPICAL

## 2024-09-01 MED ORDER — HYDROCODONE-ACETAMINOPHEN 7.5-325 MG PO TABS: 1.0000 | ORAL_TABLET | ORAL | 0 refills | Status: AC | PRN

## 2024-09-01 MED ORDER — CEFADROXIL 500 MG PO CAPS
1000.0000 mg | ORAL_CAPSULE | Freq: Two times a day (BID) | ORAL | Status: DC
  Administered 2024-09-01 – 2024-09-02 (×3): 1000 mg via ORAL
  Filled 2024-09-01 (×4): qty 2

## 2024-09-01 MED ORDER — ACETAMINOPHEN 325 MG PO TABS: 325.0000 mg | ORAL_TABLET | Freq: Four times a day (QID) | ORAL | Status: AC | PRN

## 2024-09-01 NOTE — TOC Progression Note (Addendum)
 Transition of Care [facility]) - Progression Note    Patient Details  Name: Kelly Taylor MRN: [id number] Date of Birth: September 09, 1951  [facility] CM/SW Contact  Miquan Tandon LITTIE Moose, [place] Phone Number: 09/01/2024, 12:45 PM  Clinical Narrative:    CSW provided pt with SNF bed offers and will follow up on facility choice.   1:55 PM- CSW received a call from pt. She stated her first facility choice is [facility] and her second would be [facility]. CSW confirmed bed availability with [facility], pt can admit there when medically ready for DC.   Expected Discharge Plan: Skilled Nursing Facility Barriers to Discharge: Continued Medical Work up, SNF Pending bed offer               Expected Discharge Plan and Services       Living arrangements for the past 2 months: Single Family Home                                       Social Drivers of Health (SDOH) Interventions SDOH Screenings   Food Insecurity: Food Insecurity Present (08/30/2024)  Housing: Low Risk  (08/30/2024)  Transportation Needs: No Transportation Needs (08/30/2024)  Utilities: Not At Risk (08/30/2024)  Depression (PHQ2-9): Medium Risk (07/18/2022)  Financial Resource Strain: Low Risk  (06/17/2024)   [facility]  Social Connections: Socially Isolated (08/30/2024)  Tobacco Use: Low Risk  (08/28/2024)    Readmission Risk Interventions     No data to display

## 2024-09-01 NOTE — Progress Notes (Addendum)
 PROGRESS NOTE    Kelly Taylor  FMW:[id number] DOB: 10-Jan-1951 DOA: 08/27/2024 PCP: Gerome Brunet, DO  Subjective: Pt seen and examined. Pt has selected [facility] for rehab. Bed available tomorrow. Pt worried about her right hip dressing and her ACE bandage compression wraps on her right lower leg. Have sent message to Dr. Kendal to address.   Hospital Course: CC: fall at home onto driveway HPI: Kelly Taylor is a 73 y.o. female with medical history significant of severe OSA on CPAP, mild obstructive airway disease, class III obesity (BMI 43.85), chronic back pain, type 2 diabetes, anemia, ascending aorta dilation, depression, GERD, hypothyroidism, IBS, vertigo, history of bilateral total knee arthroplasty, hyperlipidemia presenting the ED via EMS after a fall tonight.  Patient fell on her right side and hit her head and right knee.  Presented with swelling of right knee and swelling and laceration of lip, nosebleed.  She had 2 glasses of wine tonight.  She was given fentanyl  by EMS and oxygen saturation dropped to the high 80s requiring 2 L [place].  Labs notable for WBC count 13.7, glucose 171, lactic acid 3.0, ethanol level 31.  UA with positive nitrite, negative leukocytes, and microscopy showing 0-5 WBCs and many bacteria.  X-ray of right knee showing transverse fracture just above right knee prosthesis with posterior displacement of the distal femoral fracture.  X-rays of chest and right hip/pelvis showing no acute findings.  CT head/maxillofacial/C-spine and chest/abdomen/pelvis with contrast negative for acute traumatic findings.  Patient was given fentanyl , Dilaudid , Zofran , Compazine, and 1 L IV fluids in the ED.  Orthopedics consulted and [facility] called to admit.   Patient states she was in her usual state of health, feeling well.  She was walking on her driveway when she accidentally tripped on a paver and fell.  She reports falling on her right knee and hitting the right side of her face/head on  floor.  Denies loss of consciousness.  She was not able to get up and walk due to severe pain in her right knee.  She feels nauseous due to severe pain whenever she tries to move her leg.  Denies vomiting or abdominal pain.  Denies chest pain or shortness of breath.  Denies any urinary symptoms.  Significant Events: Admitted 08/27/2024 for right periprosthetic femur fracture 08-27-2024 seen by ortho consult 08-28-2024 taken to OR for ORIF right supracondylar distal femur fracture  Admission Labs: Na 141, K 3.8, CO2 of 24, BUN 15, scr 0.63, glu 171 T. Prot 6.6, alb 3.9, AST 18, ALT 16, alk phos 87, t. Bili 0.3 WBC 13.7, HgB 12.5, plt 284 ETOH 31 INR 0.9 UA spg >1.046, POSITIVE nitrite, negative LE, WBC 0-5, many bacteria Procalcitonin < 0.10  Admission Imaging Studies:   Significant Labs: Urine cx Klebsiella pneumonaie A1c of 6.8%  Significant Imaging Studies:   Antibiotic Therapy: Anti-infectives (From admission, onward)    Start     Dose/Rate Route Frequency Ordered Stop   08/29/24 0400  vancomycin  (VANCOCIN ) IVPB 1000 mg/200 mL premix        1,000 mg 200 mL/hr over 60 Minutes Intravenous Every 12 hours 08/29/24 0028 08/29/24 0716   08/28/24 1705  vancomycin  (VANCOCIN ) powder  Status:  Discontinued          As needed 08/28/24 1705 08/28/24 1752       Procedures: 08-28-2024 taken to OR for ORIF right supracondylar distal femur fracture  Consultants: ortho    Assessment and Plan: * Peri-prosthetic fracture of shaft  of femur - right 08/30/24 POD #2 from ORIF of peri-prosthetic fracture. Has seen PT/OT. Needs to see CM/TOC about SNF placement.  08/31/24 POD #3. Needs SNF. CM/TOC consulted to help with SNF placement. Pt has traditional medicare A/B.  09/01/24 POD #4. Pt has selected [facility] SNF. Bed available tomorrow.    Acute respiratory failure with hypoxia (HCC) 08/30/24 initial CXR clear. Repeat CXR. Pulmonary toileting.  08/31/24 last night CXR negative for  pneumonia. Has been on lovenox for DVT prophylaxis since admission. Doubt PE. Pulmonary toileting. Wean O2 to RA. Confirmed with pt that she does not use home O2.  09/01/24 weaned to RA. Continue with pulmonary toileting.    UTI due to Klebsiella species 08/30/24 start IV rocephin. Pharmacy has verified that pt has taken cephalosporins in the past without difficulty.  08/31/24 continue with IV Rocephin. Day #2  09/01/24 Klebsiella sensitive to Ancef . Change to po duricef.  Postoperative anemia due to acute blood loss 08/30/24 HgB 10.3 g/dl. Preop HgB of 13.6. continue to monitor.   08/31/24 repeat CBC in AM.  09/01/24 HgB 10 g/dl. Stable.   Leukocytosis-resolved as of 08/31/2024 08/30/24 resolved. likely due to acute fracture, UTI.    Type 2 diabetes mellitus with other specified complication (HCC) 08/30/24 on SSI.  08/31/24 continue SSI. CBG acceptable ranges.  09/01/24 stable.     Other hyperlipidemia 08/30/24 continue zocor   08/31/24 stable on zocor .  09/01/24 on zocor .    Morbid obesity (HCC) 08/30/24 Body mass index is 43.85 kg/m.   OSA (obstructive sleep apnea) 09/01/24 pt non-compliant with using CPAP.   DVT prophylaxis: SCDs Start: 08/29/24 0029 SCDs Start: 08/28/24 9386 lovenox    Code Status: Full Code Family Communication: no family at bedside. She is decisional. Disposition Plan: SNF Reason for continuing need for hospitalization: medically stable for DC.  Objective: Vitals:   08/31/24 1752 08/31/24 2024 09/01/24 0440 09/01/24 0952  BP: 105/62 (!) 90/57 125/77 136/75  Pulse: 88 92 78 90  Resp: 20 18 18 18   Temp: (!) 97.2 F (36.2 C) 99.4 F (37.4 C) 98.4 F (36.9 C) 98.5 F (36.9 C)  TempSrc:    Oral  SpO2: 92% 92% 93% 94%  Weight:      Height:        Intake/Output Summary (Last 24 hours) at 09/01/2024 1451 Last data filed at 09/01/2024 1400 Gross per 24 hour  Intake 218 ml  Output 450 ml  Net -232 ml   Filed Weights    08/27/24 2128  Weight: 127 kg    Examination:  Physical Exam Vitals and nursing note reviewed.  Constitutional:      General: She is not in acute distress.    Appearance: She is obese. She is not toxic-appearing.  HENT:     Head: Normocephalic and atraumatic.     Nose: Nose normal.  Eyes:     General: No scleral icterus. Pulmonary:     Effort: Pulmonary effort is normal. No respiratory distress.  Abdominal:     General: There is no distension.  Skin:    Capillary Refill: Capillary refill takes less than 2 seconds.     Comments: Right lower leg in ACE bandage compression wrap  Neurological:     Mental Status: She is alert and oriented to person, place, and time.     Data Reviewed: I have personally reviewed following labs and imaging studies  CBC: Recent Labs  Lab 08/27/24 2351 08/28/24 0635 08/29/24 0228 08/30/24 0702 09/01/24 0647  WBC  13.7* 14.7* 10.2 9.1 9.1  NEUTROABS  --   --   --   --  5.9  HGB 12.5 12.7 11.2* 10.3* 10.0*  HCT 41.7 40.6 35.2* 32.1* 31.0*  MCV 91.9 91.6 91.0 90.2 90.4  PLT 284 247 229 209 215   Basic Metabolic Panel: Recent Labs  Lab 08/27/24 2300 08/27/24 2351 08/29/24 0228 09/01/24 0647  NA 141 141 137 137  K 3.8 3.8 4.2 3.8  CL 104 104 100 99  CO2  --  24 28 29   GLUCOSE 180* 171* 151* 132*  BUN 16 15 10 11   CREATININE 0.70 0.63 0.71 0.55  CALCIUM   --  9.6 8.7* 8.6*   GFR: Estimated Creatinine Clearance: 90.7 mL/min (by C-G formula based on SCr of 0.55 mg/dL). Liver Function Tests: Recent Labs  Lab 08/27/24 2351 09/01/24 0647  AST 18 19  ALT 16 35  ALKPHOS 87 106  BILITOT 0.3 0.6  PROT 6.6 5.7*  ALBUMIN 3.9 2.5*   Coagulation Profile: Recent Labs  Lab 08/27/24 2351  INR 0.9   CBG: Recent Labs  Lab 08/31/24 1133 08/31/24 1821 08/31/24 2030 09/01/24 0753 09/01/24 1159  GLUCAP 157* 159* 133* 140* 145*   Sepsis Labs: Recent Labs  Lab 08/27/24 2301 08/28/24 0635 08/28/24 0700  PROCALCITON  --   --   <0.10  LATICACIDVEN 3.0* 3.3*  --     Recent Results (from the past 240 hours)  Urine Culture (for pregnant, neutropenic or urologic patients or patients with an indwelling urinary catheter)     Status: Abnormal   Collection Time: 08/28/24 12:50 AM   Specimen: Urine, Clean Catch  Result Value Ref Range Status   Specimen Description   Final    URINE, CLEAN CATCH Performed at [facility], [address], [place], [place] [address]    Special Requests   Final    NONE Performed at [facility], [address] [place], [place] [address]    Culture >=100,000 COLONIES/mL KLEBSIELLA PNEUMONIAE (A)  Final   Report Status 08/30/2024 FINAL  Final   Organism ID, Bacteria KLEBSIELLA PNEUMONIAE (A)  Final      Susceptibility   Klebsiella pneumoniae - MIC*    AMPICILLIN >=32 RESISTANT Resistant     CEFAZOLIN  (URINE) Value in next row Sensitive      2 SENSITIVEThis is a modified FDA-approved test that has been validated and its performance characteristics determined by the reporting laboratory.  This laboratory is certified under the Clinical Laboratory Improvement Amendments CLIA as qualified to perform high complexity clinical laboratory testing.    CEFEPIME Value in next row Sensitive      2 SENSITIVEThis is a modified FDA-approved test that has been validated and its performance characteristics determined by the reporting laboratory.  This laboratory is certified under the Clinical Laboratory Improvement Amendments CLIA as qualified to perform high complexity clinical laboratory testing.    ERTAPENEM Value in next row Sensitive      2 SENSITIVEThis is a modified FDA-approved test that has been validated and its performance characteristics determined by the reporting laboratory.  This laboratory is certified under the Clinical Laboratory Improvement Amendments CLIA as qualified to perform high complexity clinical laboratory testing.    CEFTRIAXONE Value in next  row Sensitive      2 SENSITIVEThis is a modified FDA-approved test that has been validated and its performance characteristics determined by the reporting laboratory.  This laboratory is certified under the Clinical Laboratory Improvement Amendments  CLIA as qualified to perform high complexity clinical laboratory testing.    CIPROFLOXACIN Value in next row Sensitive      2 SENSITIVEThis is a modified FDA-approved test that has been validated and its performance characteristics determined by the reporting laboratory.  This laboratory is certified under the Clinical Laboratory Improvement Amendments CLIA as qualified to perform high complexity clinical laboratory testing.    GENTAMICIN Value in next row Sensitive      2 SENSITIVEThis is a modified FDA-approved test that has been validated and its performance characteristics determined by the reporting laboratory.  This laboratory is certified under the Clinical Laboratory Improvement Amendments CLIA as qualified to perform high complexity clinical laboratory testing.    NITROFURANTOIN  Value in next row Intermediate      2 SENSITIVEThis is a modified FDA-approved test that has been validated and its performance characteristics determined by the reporting laboratory.  This laboratory is certified under the Clinical Laboratory Improvement Amendments CLIA as qualified to perform high complexity clinical laboratory testing.    TRIMETH/SULFA Value in next row Sensitive      2 SENSITIVEThis is a modified FDA-approved test that has been validated and its performance characteristics determined by the reporting laboratory.  This laboratory is certified under the Clinical Laboratory Improvement Amendments CLIA as qualified to perform high complexity clinical laboratory testing.    AMPICILLIN/SULBACTAM Value in next row Sensitive      2 SENSITIVEThis is a modified FDA-approved test that has been validated and its performance characteristics determined by the reporting  laboratory.  This laboratory is certified under the Clinical Laboratory Improvement Amendments CLIA as qualified to perform high complexity clinical laboratory testing.    PIP/TAZO Value in next row Sensitive      <=4 SENSITIVEThis is a modified FDA-approved test that has been validated and its performance characteristics determined by the reporting laboratory.  This laboratory is certified under the Clinical Laboratory Improvement Amendments CLIA as qualified to perform high complexity clinical laboratory testing.    MEROPENEM Value in next row Sensitive      <=4 SENSITIVEThis is a modified FDA-approved test that has been validated and its performance characteristics determined by the reporting laboratory.  This laboratory is certified under the Clinical Laboratory Improvement Amendments CLIA as qualified to perform high complexity clinical laboratory testing.    * >=100,000 COLONIES/mL KLEBSIELLA PNEUMONIAE  Surgical pcr screen     Status: Abnormal   Collection Time: 08/28/24  2:17 PM   Specimen: Nasal Mucosa; Nasal Swab  Result Value Ref Range Status   MRSA, PCR POSITIVE (A) NEGATIVE Final    Comment: RESULT CALLED TO, READ BACK BY AND VERIFIED WITH: EMAILED LISA H. [id number] AT 2040, ADC    Staphylococcus aureus POSITIVE (A) NEGATIVE Final    Comment: (NOTE) The Xpert SA Assay (FDA approved for NASAL specimens in patients 32 years of age and older), is one component of a comprehensive surveillance program. It is not intended to diagnose infection nor to guide or monitor treatment. Performed at [facility], [address], [place], [place] [address]      Radiology Studies: DG CHEST PORT 1 VIEW Result Date: 08/30/2024 CLINICAL DATA:  Hypoxia EXAM: PORTABLE CHEST 1 VIEW COMPARISON:  08/28/2019 FINDINGS: Cardiac shadow is stable. Tortuous thoracic aorta is seen. Lungs are clear. No bony abnormality is noted. Surgical changes in the shoulders are noted bilaterally. IMPRESSION: No  active disease. Electronically Signed   By: Oneil Devonshire M.D.   On: 08/30/2024 20:08  Scheduled Meds:  cefadroxil  1,000 mg Oral BID   Chlorhexidine  Gluconate Cloth  6 each Topical Daily   docusate sodium   100 mg Oral BID   enoxaparin (LOVENOX) injection  60 mg Subcutaneous Q24H   fesoterodine   4 mg Oral Daily   insulin  aspart  0-9 Units Subcutaneous TID WC   lactulose   20 g Oral Daily   levothyroxine   75 mcg Oral QAC breakfast   mupirocin  ointment   Nasal BID   simvastatin   10 mg Oral QHS   Continuous Infusions:   LOS: 4 days   Time spent: 50 minutes  Camellia Door, DO  [facility]  09/01/2024, 2:51 PM

## 2024-09-01 NOTE — Assessment & Plan Note (Signed)
 09/01/24 pt non-compliant with using CPAP.  09/02/24 pt non-compliant with using CPAP.

## 2024-09-01 NOTE — Progress Notes (Signed)
 Physical Therapy Treatment Patient Details Name: ELYSSE POLIDORE MRN: [id number] DOB: 1951/10/13 Today's Date: 09/01/2024   History of Present Illness Pt is a 73 y.o. female admitted 08/27/24 after ground level fall, sustaining right periprosthetic supracondylar distal femur fracture. Pt s/p ORIF 10/9. PMHx: T2DM, severe OSA on CPAP, mild obstructive airway disease, obesity, chronic back pain, anemia, ascending aorta dilation, depression, GERD, hypothyroidism, IBS, vertigo, HLD, and hx of B TKA.    PT Comments  Pt resting in bed on arrival, agreeable to session and demonstrating slow but steady progress towards acute goals as pt with RLE pain with weightbearing limiting standing tolerance and gait progression. Pt able to self mobilize RLE to and off EOB to come to sitting EOB with CGA for safety. Pt continues to require mod A +2 for sit<>stand transfers. Pt unable to take steps away from EOB this session due to pain, however able to perform pre-gait activities at EOB to increased RLE weight bearing tolerance. Pt able to pivot on LLE to mobilize up to [facility] in standing. Pt continues to benefit from skilled PT services to progress toward functional mobility goals.      If plan is discharge home, recommend the following: Two people to help with walking and/or transfers;Two people to help with bathing/dressing/bathroom;Assistance with cooking/housework;Assist for transportation;Help with stairs or ramp for entrance   Can travel by private vehicle     No  Equipment Recommendations  Hoyer lift;Wheelchair (measurements PT);Wheelchair cushion (measurements PT)    Recommendations for Other Services       Precautions / Restrictions Precautions Precautions: Fall Recall of Precautions/Restrictions: Intact Restrictions Weight Bearing Restrictions Per Provider Order: Yes RLE Weight Bearing Per Provider Order: Weight bearing as tolerated     Mobility  Bed Mobility Overal bed mobility: Needs  Assistance Bed Mobility: Supine to Sit, Sit to Supine     Supine to sit: HOB elevated, Used rails, Contact guard Sit to supine: Min assist   General bed mobility comments: pt able to self mobilize RLE with use of gait belt to and off EOB, min A to bring RLE into bed at end of session    Transfers Overall transfer level: Needs assistance Equipment used: Rolling walker (2 wheels) Transfers: Sit to/from Stand, Bed to chair/wheelchair/BSC Sit to Stand: From elevated surface, Mod assist, +2 physical assistance           General transfer comment: Pt stood from raised bed height. Cued proper hand placement. Assisted pt with RLE positioning. She powered up with modA x2 and aid of bed pad to faciliate increased hip flex. Pt took increased time to achieve upright posture and accept weight on RLE.    Ambulation/Gait             Pre-gait activities: weight shifting in standing, RLE marching, heel raises on L General Gait Details: pt unable to step LLE due to pain in R with increased WBing   Stairs             Wheelchair Mobility     Tilt Bed    Modified Rankin (Stroke Patients Only)       Balance Overall balance assessment: Needs assistance Sitting-balance support: Bilateral upper extremity supported, Feet supported, No upper extremity supported Sitting balance-Leahy Scale: Fair Sitting balance - Comments: Pt sat EOB with close supervision. She was able to reach down the front of her legs and back without LOB.   Standing balance support: Bilateral upper extremity supported, During functional activity, Reliant on assistive device  for balance Standing balance-Leahy Scale: Poor Standing balance comment: Pt dependent on RW                            Communication Communication Communication: No apparent difficulties  Cognition Arousal: Alert Behavior During Therapy: WFL for tasks assessed/performed   PT - Cognitive impairments: No apparent impairments                          Following commands: Intact      Cueing Cueing Techniques: Verbal cues, Gestural cues  Exercises Other Exercises Other Exercises: stanidng marching RLE x10 Other Exercises: heel raises on L in standing x10 to increased weight acceptance on R in standing    General Comments        Pertinent Vitals/Pain Pain Assessment Pain Assessment: Faces Faces Pain Scale: Hurts little more Pain Location: R Knee Pain Descriptors / Indicators: Operative site guarding, Grimacing, Discomfort Pain Intervention(s): Limited activity within patient's tolerance, Monitored during session, Patient requesting pain meds-RN notified    Home Living                          Prior Function            PT Goals (current goals can now be found in the care plan section) Acute Rehab PT Goals Patient Stated Goal: Regain independence PT Goal Formulation: With patient Time For Goal Achievement: 09/12/24 Progress towards PT goals: Progressing toward goals    Frequency    Min 2X/week      PT Plan      Co-evaluation PT/OT/SLP Co-Evaluation/Treatment: Yes Reason for Co-Treatment: For patient/therapist safety;To address functional/ADL transfers PT goals addressed during session: Mobility/safety with mobility;Balance;Proper use of DME OT goals addressed during session: ADL's and self-care;Proper use of Adaptive equipment and DME      AM-PAC PT 6 Clicks Mobility   Outcome Measure  Help needed turning from your back to your side while in a flat bed without using bedrails?: Total Help needed moving from lying on your back to sitting on the side of a flat bed without using bedrails?: Total Help needed moving to and from a bed to a chair (including a wheelchair)?: Total Help needed standing up from a chair using your arms (e.g., wheelchair or bedside chair)?: Total Help needed to walk in hospital room?: Total Help needed climbing 3-5 steps with a railing? :  Total 6 Click Score: 6    End of Session Equipment Utilized During Treatment: Gait belt Activity Tolerance: Patient tolerated treatment well Patient left: with call bell/phone within reach;in bed;with bed alarm set Nurse Communication: Mobility status;Patient requests pain meds PT Visit Diagnosis: Difficulty in walking, not elsewhere classified (R26.2);Pain;Other abnormalities of gait and mobility (R26.89) Pain - Right/Left: Right Pain - part of body: Hip;Knee;Leg     Time: 8476-8453 PT Time Calculation (min) (ACUTE ONLY): 23 min  Charges:    $Therapeutic Exercise: 8-22 mins $Therapeutic Activity: 8-22 mins PT General Charges $$ ACUTE PT VISIT: 1 Visit                     Rajinder Mesick R. PTA Acute Rehabilitation Services Office: [phone]   Therisa CHRISTELLA Boor 09/01/2024, 4:33 PM

## 2024-09-01 NOTE — Discharge Instructions (Signed)
 [facility] Discharge Instructions   General Discharge Instructions  WEIGHT BEARING STATUS:Weightbearing as tolerated right lower extremity  RANGE OF MOTION/ACTIVITY: Unrestricted motion of the knee and hip  Wound Care: You may remove your surgical dressing on post op day 3. Incisions can be left open to air if there is no drainage. Once the incision is completely dry and without drainage, it may be left open to air out.  Showering may begin post op day 4.  Clean incision gently with soap and water .  DVT/PE prophylaxis: Eliquis 2.5 mg twice daily x 30 days  Diet: as you were eating previously.  Can use over the counter stool softeners and bowel preparations, such as Miralax , to help with bowel movements.  Narcotics can be constipating.  Be sure to drink plenty of fluids  PAIN MEDICATION USE AND EXPECTATIONS  You have likely been given narcotic medications to help control your pain.  After a traumatic event that results in an fracture (broken bone) with or without surgery, it is ok to use narcotic pain medications to help control one's pain.  We understand that everyone responds to pain differently and each individual patient will be evaluated on a regular basis for the continued need for narcotic medications. Ideally, narcotic medication use should last no more than 6-8 weeks (coinciding with fracture healing).   As a patient it is your responsibility as well to monitor narcotic medication use and report the amount and frequency you use these medications when you come to your office visit.   We would also advise that if you are using narcotic medications, you should take a dose prior to therapy to maximize you participation.  IF YOU ARE ON NARCOTIC MEDICATIONS IT IS NOT PERMISSIBLE TO OPERATE A MOTOR VEHICLE (MOTORCYCLE/CAR/TRUCK/MOPED) OR HEAVY MACHINERY DO NOT MIX NARCOTICS WITH OTHER CNS (CENTRAL NERVOUS SYSTEM) DEPRESSANTS SUCH AS ALCOHOL  POST-OPERATIVE OPIOID TAPER  INSTRUCTIONS: It is important to wean off of your opioid medication as soon as possible. If you do not need pain medication after your surgery it is ok to stop day one. Opioids include: Codeine, Hydrocodone (Norco, Vicodin), Oxycodone (Percocet, oxycontin ) and hydromorphone  amongst others.  Long term and even short term use of opiods can cause: Increased pain response Dependence Constipation Depression Respiratory depression And more.  Withdrawal symptoms can include Flu like symptoms Nausea, vomiting And more Techniques to manage these symptoms Hydrate well Eat regular healthy meals Stay active Use relaxation techniques(deep breathing, meditating, yoga) Do Not substitute Alcohol to help with tapering If you have been on opioids for less than two weeks and do not have pain than it is ok to stop all together.  Plan to wean off of opioids This plan should start within one week post op of your fracture surgery  Maintain the same interval or time between taking each dose and first decrease the dose.  Cut the total daily intake of opioids by one tablet each day Next start to increase the time between doses. The last dose that should be eliminated is the evening dose.    STOP SMOKING OR USING NICOTINE PRODUCTS!!!!  As discussed nicotine severely impairs your body's ability to heal surgical and traumatic wounds but also impairs bone healing.  Wounds and bone heal by forming microscopic blood vessels (angiogenesis) and nicotine is a vasoconstrictor (essentially, shrinks blood vessels).  Therefore, if vasoconstriction occurs to these microscopic blood vessels they essentially disappear and are unable to deliver necessary nutrients to the healing tissue.  This is one  modifiable factor that you can do to dramatically increase your chances of healing your injury.  (This means no smoking, no nicotine gum, patches, etc)  DO NOT USE NONSTEROIDAL ANTI-INFLAMMATORY DRUGS (NSAID'S)  Using products such  as Advil (ibuprofen), Aleve (naproxen), Motrin (ibuprofen) for additional pain control during fracture healing can delay and/or prevent the healing response.  If you would like to take over the counter (OTC) medication, Tylenol  (acetaminophen ) is ok.  However, some narcotic medications that are given for pain control contain acetaminophen  as well. Therefore, you should not exceed more than 4000 mg of tylenol  in a day if you do not have liver disease.  Also note that there are may OTC medicines, such as cold medicines and allergy medicines that my contain tylenol  as well.  If you have any questions about medications and/or interactions please ask your doctor/PA or your pharmacist.      ICE AND ELEVATE INJURED/OPERATIVE EXTREMITY  Using ice and elevating the injured extremity above your heart can help with swelling and pain control.  Icing in a pulsatile fashion, such as 20 minutes on and 20 minutes off, can be followed.    Do not place ice directly on skin. Make sure there is a barrier between to skin and the ice pack.    Using frozen items such as frozen peas works well as the conform nicely to the are that needs to be iced.  USE AN ACE WRAP OR TED HOSE FOR SWELLING CONTROL  In addition to icing and elevation, Ace wraps or TED hose are used to help limit and resolve swelling.  It is recommended to use Ace wraps or TED hose until you are informed to stop.    When using Ace Wraps start the wrapping distally (farthest away from the body) and wrap proximally (closer to the body)   Example: If you had surgery on your leg or thing and you do not have a splint on, start the ace wrap at the toes and work your way up to the thigh        If you had surgery on your upper extremity and do not have a splint on, start the ace wrap at your fingers and work your way up to the upper arm    CALL THE OFFICE FOR MEDICATION REFILLS OR WITH ANY QUESTIONS/CONCERNS: [phone]   VISIT OUR WEBSITE FOR ADDITIONAL  INFORMATION: [email or web address]    Discharge Wound Care Instructions  Do NOT apply any ointments, solutions or lotions to pin sites or surgical wounds.  These prevent needed drainage and even though solutions like hydrogen peroxide kill bacteria, they also damage cells lining the pin sites that help fight infection.  Applying lotions or ointments can keep the wounds moist and can cause them to breakdown and open up as well. This can increase the risk for infection. When in doubt call the office.  Surgical incisions should be dressed daily.  If any drainage is noted, use one layer of adaptic or Mepitel, then gauze, Kerlix, and an ace wrap. - These dressing supplies should be available at local medical supply stores ([facility], [facility], etc) as well as Insurance claims handler ([facility], [facility], [place], etc)  Once the incision is completely dry and without drainage, it may be left open to air out.  Showering may begin 36-48 hours later.  Cleaning gently with soap and water .  Traumatic wounds should be dressed daily as well.    One layer of adaptic, gauze, Kerlix, then ace wrap.  The adaptic can be discontinued once the draining has ceased    If you have a wet to dry dressing: wet the gauze with saline the squeeze as much saline out so the gauze is moist (not soaking wet), place moistened gauze over wound, then place a dry gauze over the moist one, followed by Kerlix wrap, then ace wrap.    Call office for the following: Temperature greater than 101F Persistent nausea and vomiting Severe uncontrolled pain Redness, tenderness, or signs of infection (pain, swelling, redness, odor or green/yellow discharge around the site) Difficulty breathing, headache or visual disturbances Hives Persistent dizziness or light-headedness Extreme fatigue Any other questions or concerns you may have after discharge  In an emergency, call 911 or go to an Emergency Department at a nearby hospital  OTHER  HELPFUL INFORMATION  If you had a block, it will wear off between 8-24 hrs postop typically.  This is period when your pain may go from nearly zero to the pain you would have had postop without the block.  This is an abrupt transition but nothing dangerous is happening.  You may take an extra dose of narcotic when this happens.  You should wean off your narcotic medicines as soon as you are able.  Most patients will be off or using minimal narcotics before their first postop appointment.   We suggest you use the pain medication the first night prior to going to bed, in order to ease any pain when the anesthesia wears off. You should avoid taking pain medications on an empty stomach as it will make you nauseous.  Do not drink alcoholic beverages or take illicit drugs when taking pain medications.  In most states it is against the law to drive while you are in a splint or sling.  And certainly against the law to drive while taking narcotics.  You may return to work/school in the next couple of days when you feel up to it.   Pain medication may make you constipated.  Below are a few solutions to try in this order: Decrease the amount of pain medication if you aren't having pain. Drink lots of decaffeinated fluids. Drink prune juice and/or each dried prunes  If the first 3 don't work start with additional solutions Take Colace - an over-the-counter stool softener Take Senokot - an over-the-counter laxative Take Miralax  - a stronger over-the-counter laxative

## 2024-09-01 NOTE — Care Management Important Message (Signed)
 Important Message  Patient Details  Name: Kelly Taylor MRN: [id number] Date of Birth: [birth year]   Important Message Given:  Yes - Medicare IM     Jon Cruel 09/01/2024, 4:53 PM

## 2024-09-01 NOTE — Plan of Care (Signed)
  Problem: Education: Goal: Ability to describe self-care measures that may prevent or decrease complications (Diabetes Survival Skills Education) will improve Outcome: Progressing   Problem: Coping: Goal: Ability to adjust to condition or change in health will improve Outcome: Progressing   Problem: Tissue Perfusion: Goal: Adequacy of tissue perfusion will improve Outcome: Progressing   Problem: Nutrition: Goal: Adequate nutrition will be maintained Outcome: Progressing   Problem: Pain Managment: Goal: General experience of comfort will improve and/or be controlled Outcome: Progressing   Problem: Safety: Goal: Ability to remain free from injury will improve Outcome: Progressing

## 2024-09-01 NOTE — Plan of Care (Signed)

## 2024-09-02 DIAGNOSIS — R1030 Lower abdominal pain, unspecified: Secondary | ICD-10-CM | POA: Diagnosis not present

## 2024-09-02 DIAGNOSIS — D62 Acute posthemorrhagic anemia: Secondary | ICD-10-CM | POA: Diagnosis not present

## 2024-09-02 DIAGNOSIS — Z6841 Body Mass Index (BMI) 40.0 and over, adult: Secondary | ICD-10-CM | POA: Diagnosis not present

## 2024-09-02 DIAGNOSIS — R2681 Unsteadiness on feet: Secondary | ICD-10-CM | POA: Diagnosis not present

## 2024-09-02 DIAGNOSIS — Z7401 Bed confinement status: Secondary | ICD-10-CM | POA: Diagnosis not present

## 2024-09-02 DIAGNOSIS — I719 Aortic aneurysm of unspecified site, without rupture: Secondary | ICD-10-CM | POA: Diagnosis not present

## 2024-09-02 DIAGNOSIS — G4733 Obstructive sleep apnea (adult) (pediatric): Secondary | ICD-10-CM

## 2024-09-02 DIAGNOSIS — B9689 Other specified bacterial agents as the cause of diseases classified elsewhere: Secondary | ICD-10-CM | POA: Diagnosis not present

## 2024-09-02 DIAGNOSIS — S86999A Other injury of unspecified muscle(s) and tendon(s) at lower leg level, unspecified leg, initial encounter: Secondary | ICD-10-CM | POA: Diagnosis not present

## 2024-09-02 DIAGNOSIS — D649 Anemia, unspecified: Secondary | ICD-10-CM | POA: Diagnosis not present

## 2024-09-02 DIAGNOSIS — J9601 Acute respiratory failure with hypoxia: Secondary | ICD-10-CM | POA: Diagnosis not present

## 2024-09-02 DIAGNOSIS — S72401D Unspecified fracture of lower end of right femur, subsequent encounter for closed fracture with routine healing: Secondary | ICD-10-CM | POA: Diagnosis not present

## 2024-09-02 DIAGNOSIS — M9711XD Periprosthetic fracture around internal prosthetic right knee joint, subsequent encounter: Secondary | ICD-10-CM | POA: Diagnosis not present

## 2024-09-02 DIAGNOSIS — Z96651 Presence of right artificial knee joint: Secondary | ICD-10-CM | POA: Diagnosis not present

## 2024-09-02 DIAGNOSIS — S42411D Displaced simple supracondylar fracture without intercondylar fracture of right humerus, subsequent encounter for fracture with routine healing: Secondary | ICD-10-CM | POA: Diagnosis not present

## 2024-09-02 DIAGNOSIS — E669 Obesity, unspecified: Secondary | ICD-10-CM | POA: Diagnosis not present

## 2024-09-02 DIAGNOSIS — G8929 Other chronic pain: Secondary | ICD-10-CM | POA: Diagnosis not present

## 2024-09-02 DIAGNOSIS — R278 Other lack of coordination: Secondary | ICD-10-CM | POA: Diagnosis not present

## 2024-09-02 DIAGNOSIS — E039 Hypothyroidism, unspecified: Secondary | ICD-10-CM | POA: Diagnosis not present

## 2024-09-02 DIAGNOSIS — M6281 Muscle weakness (generalized): Secondary | ICD-10-CM | POA: Diagnosis not present

## 2024-09-02 DIAGNOSIS — B961 Klebsiella pneumoniae [K. pneumoniae] as the cause of diseases classified elsewhere: Secondary | ICD-10-CM | POA: Diagnosis not present

## 2024-09-02 DIAGNOSIS — E785 Hyperlipidemia, unspecified: Secondary | ICD-10-CM | POA: Diagnosis not present

## 2024-09-02 DIAGNOSIS — N390 Urinary tract infection, site not specified: Secondary | ICD-10-CM | POA: Diagnosis not present

## 2024-09-02 DIAGNOSIS — M25561 Pain in right knee: Secondary | ICD-10-CM | POA: Diagnosis not present

## 2024-09-02 DIAGNOSIS — M48061 Spinal stenosis, lumbar region without neurogenic claudication: Secondary | ICD-10-CM | POA: Diagnosis not present

## 2024-09-02 DIAGNOSIS — Z96649 Presence of unspecified artificial hip joint: Secondary | ICD-10-CM | POA: Diagnosis not present

## 2024-09-02 DIAGNOSIS — J45909 Unspecified asthma, uncomplicated: Secondary | ICD-10-CM | POA: Diagnosis not present

## 2024-09-02 DIAGNOSIS — N3281 Overactive bladder: Secondary | ICD-10-CM | POA: Diagnosis not present

## 2024-09-02 DIAGNOSIS — E119 Type 2 diabetes mellitus without complications: Secondary | ICD-10-CM | POA: Diagnosis not present

## 2024-09-02 DIAGNOSIS — W19XXXD Unspecified fall, subsequent encounter: Secondary | ICD-10-CM | POA: Diagnosis not present

## 2024-09-02 DIAGNOSIS — R2689 Other abnormalities of gait and mobility: Secondary | ICD-10-CM | POA: Diagnosis not present

## 2024-09-02 DIAGNOSIS — M978XXA Periprosthetic fracture around other internal prosthetic joint, initial encounter: Secondary | ICD-10-CM | POA: Diagnosis not present

## 2024-09-02 DIAGNOSIS — K589 Irritable bowel syndrome without diarrhea: Secondary | ICD-10-CM | POA: Diagnosis not present

## 2024-09-02 DIAGNOSIS — M4312 Spondylolisthesis, cervical region: Secondary | ICD-10-CM | POA: Diagnosis not present

## 2024-09-02 DIAGNOSIS — E1169 Type 2 diabetes mellitus with other specified complication: Secondary | ICD-10-CM | POA: Diagnosis not present

## 2024-09-02 DIAGNOSIS — S72451D Displaced supracondylar fracture without intracondylar extension of lower end of right femur, subsequent encounter for closed fracture with routine healing: Secondary | ICD-10-CM | POA: Diagnosis not present

## 2024-09-02 DIAGNOSIS — F339 Major depressive disorder, recurrent, unspecified: Secondary | ICD-10-CM | POA: Diagnosis not present

## 2024-09-02 LAB — GLUCOSE, CAPILLARY: Glucose-Capillary: 144 mg/dL — ABNORMAL HIGH (ref 70–99)

## 2024-09-02 MED ORDER — CEFADROXIL 500 MG PO CAPS: 1000.0000 mg | ORAL_CAPSULE | Freq: Two times a day (BID) | ORAL | 0 refills | Status: AC

## 2024-09-02 MED ORDER — ONDANSETRON HCL 4 MG PO TABS: 4.0000 mg | ORAL_TABLET | Freq: Four times a day (QID) | ORAL | Status: AC | PRN

## 2024-09-02 NOTE — Plan of Care (Signed)
  Problem: Education: Goal: Ability to describe self-care measures that may prevent or decrease complications (Diabetes Survival Skills Education) will improve Outcome: Progressing   Problem: Coping: Goal: Ability to adjust to condition or change in health will improve Outcome: Progressing   Problem: Activity: Goal: Risk for activity intolerance will decrease Outcome: Progressing   Problem: Nutrition: Goal: Adequate nutrition will be maintained Outcome: Progressing   Problem: Coping: Goal: Level of anxiety will decrease Outcome: Progressing   Problem: Elimination: Goal: Will not experience complications related to bowel motility Outcome: Progressing   Problem: Safety: Goal: Ability to remain free from injury will improve Outcome: Progressing   Problem: Skin Integrity: Goal: Risk for impaired skin integrity will decrease Outcome: Progressing

## 2024-09-02 NOTE — Progress Notes (Signed)
 Attempted to call report to [facility], unable to get anyone on the phone 3x attempt.

## 2024-09-02 NOTE — Progress Notes (Signed)
 PROGRESS NOTE    Kelly Taylor  FMW:[id number] DOB: 15-Oct-1951 DOA: 08/27/2024 PCP: Gerome Brunet, DO  Subjective: Pt seen and examined. Pt has selected [facility] for rehab. Bed available tomorrow. Pt worried about her right hip dressing and her ACE bandage compression wraps on her right lower leg. Have sent message to Dr. Kendal to address.   Hospital Course: CC: fall at home onto driveway HPI: Kelly Taylor is a 73 y.o. female with medical history significant of severe OSA on CPAP, mild obstructive airway disease, class III obesity (BMI 43.85), chronic back pain, type 2 diabetes, anemia, ascending aorta dilation, depression, GERD, hypothyroidism, IBS, vertigo, history of bilateral total knee arthroplasty, hyperlipidemia presenting the ED via EMS after a fall tonight.  Patient fell on her right side and hit her head and right knee.  Presented with swelling of right knee and swelling and laceration of lip, nosebleed.  She had 2 glasses of wine tonight.  She was given fentanyl  by EMS and oxygen saturation dropped to the high 80s requiring 2 L [place].  Labs notable for WBC count 13.7, glucose 171, lactic acid 3.0, ethanol level 31.  UA with positive nitrite, negative leukocytes, and microscopy showing 0-5 WBCs and many bacteria.  X-ray of right knee showing transverse fracture just above right knee prosthesis with posterior displacement of the distal femoral fracture.  X-rays of chest and right hip/pelvis showing no acute findings.  CT head/maxillofacial/C-spine and chest/abdomen/pelvis with contrast negative for acute traumatic findings.  Patient was given fentanyl , Dilaudid , Zofran , Compazine, and 1 L IV fluids in the ED.  Orthopedics consulted and [facility] called to admit.   Patient states she was in her usual state of health, feeling well.  She was walking on her driveway when she accidentally tripped on a paver and fell.  She reports falling on her right knee and hitting the right side of her face/head on  floor.  Denies loss of consciousness.  She was not able to get up and walk due to severe pain in her right knee.  She feels nauseous due to severe pain whenever she tries to move her leg.  Denies vomiting or abdominal pain.  Denies chest pain or shortness of breath.  Denies any urinary symptoms.  Significant Events: Admitted 08/27/2024 for right periprosthetic femur fracture 08-27-2024 seen by ortho consult 08-28-2024 taken to OR for ORIF right supracondylar distal femur fracture  Admission Labs: Na 141, K 3.8, CO2 of 24, BUN 15, scr 0.63, glu 171 T. Prot 6.6, alb 3.9, AST 18, ALT 16, alk phos 87, t. Bili 0.3 WBC 13.7, HgB 12.5, plt 284 ETOH 31 INR 0.9 UA spg >1.046, POSITIVE nitrite, negative LE, WBC 0-5, many bacteria Procalcitonin < 0.10  Admission Imaging Studies:   Significant Labs: Urine cx Klebsiella pneumonaie A1c of 6.8%  Significant Imaging Studies: 08-30-2024 CXR No active disease.   Antibiotic Therapy: Anti-infectives (From admission, onward)    Start     Dose/Rate Route Frequency Ordered Stop   08/29/24 0400  vancomycin  (VANCOCIN ) IVPB 1000 mg/200 mL premix        1,000 mg 200 mL/hr over 60 Minutes Intravenous Every 12 hours 08/29/24 0028 08/29/24 0716   08/28/24 1705  vancomycin  (VANCOCIN ) powder  Status:  Discontinued          As needed 08/28/24 1705 08/28/24 1752       Procedures: 08-28-2024 taken to OR for ORIF right supracondylar distal femur fracture  Consultants: ortho    Assessment and Plan: *  Peri-prosthetic fracture of shaft of femur - right 08/30/24 POD #2 from ORIF of peri-prosthetic fracture. Has seen PT/OT. Needs to see CM/TOC about SNF placement.  08/31/24 POD #3. Needs SNF. CM/TOC consulted to help with SNF placement. Pt has traditional medicare A/B.  09/01/24 POD #4. Pt has selected [facility] SNF. Bed available tomorrow.  09/02/24 POD #5. DC to [facility] SNF. Ortho has discharged her on Eliquis 2.5 mg bid for DVT prophylaxis.  Ortho  will decide duration of Eliquis after pt is seen in f/u clinic.     UTI due to Klebsiella species - pt has tolerated Rocephin and Duricef withOUT any signs of allergic reaction 08/30/24 start IV rocephin. Pharmacy has verified that pt has taken cephalosporins in the past without difficulty.  08/31/24 continue with IV Rocephin. Day #2  09/01/24 Klebsiella sensitive to Ancef . Change to po duricef.  09/02/24 tolerating Duricef. Complete 3 more days. Total of 5 days of abx therapy. Susceptibility data from last 90 days. Collected Specimen Info Organism AMPICILLIN AMPICILLIN/SULBACTAM CEFAZOLIN  (URINE) CEFEPIME CEFTRIAXONE Ciprofloxacin Ertapenem Gentamicin Susc lslt Meropenem Nitrofurantoin  Susc lslt Piperacillin + Tazobactam  08/28/24 Urine, Clean Catch Klebsiella pneumoniae  R  S S  S  S  S  S  S  S  I S  07/04/24 Urine, Clean Catch Escherichia coli  S  S S  S  S  S  S  S  S  S S   Collected Specimen Info Organism Trimethoprim/Sulfa  08/28/24 Urine, Clean Catch Klebsiella pneumoniae  S  07/04/24 Urine, Clean Catch Escherichia coli  S     Postoperative anemia due to acute blood loss 08/30/24 HgB 10.3 g/dl. Preop HgB of 13.6. continue to monitor.   08/31/24 repeat CBC in AM.  09/01/24 HgB 10 g/dl. Stable.  09/02/24 stable. HgB @ 10 g/dl.  Acute respiratory failure with hypoxia (HCC)-resolved as of 09/02/2024 08/30/24 initial CXR clear. Repeat CXR. Pulmonary toileting.  08/31/24 last night CXR negative for pneumonia. Has been on lovenox for DVT prophylaxis since admission. Doubt PE. Pulmonary toileting. Wean O2 to RA. Confirmed with pt that she does not use home O2.  09/01/24 weaned to RA. Continue with pulmonary toileting.  09/02/24 resolved. Continue with pulmonary toileting at SNF to prevent pneumonia.    Leukocytosis-resolved as of 08/31/2024 08/30/24 resolved. likely due to acute fracture, UTI.    OSA (obstructive sleep apnea) 09/01/24 pt non-compliant with using  CPAP.  09/02/24 pt non-compliant with using CPAP.   Type 2 diabetes mellitus with other specified complication (HCC) 08/30/24 on SSI.  08/31/24 continue SSI. CBG acceptable ranges.  09/01/24 stable.  09/02/24 restart metformin  at discharge. 500 mg bid.     Other hyperlipidemia 08/30/24 continue zocor   08/31/24 stable on zocor .  09/01/24 on zocor .  09/02/24 continue zocor  10 mg qhs    Morbid obesity (HCC) 08/30/24 Body mass index is 43.85 kg/m.        DVT prophylaxis: SCDs Start: 08/29/24 0029 SCDs Start: 08/28/24 9386 lovenox    Code Status: Full Code Family Communication: no family at bedside. She is decisional Disposition Plan: SNF Reason for continuing need for hospitalization: stable for DC  Objective: Vitals:   09/01/24 0952 09/01/24 1716 09/01/24 2226 09/02/24 0607  BP: 136/75 129/79 116/72 130/74  Pulse: 90 90 83 95  Resp: 18 18 17 17   Temp: 98.5 F (36.9 C) 98.2 F (36.8 C) 98.5 F (36.9 C) 98.2 F (36.8 C)  TempSrc: Oral Oral Oral Oral  SpO2: 94% 94% 94% 90%  Weight:      Height:        Intake/Output Summary (Last 24 hours) at 09/02/2024 0927 Last data filed at 09/02/2024 0606 Gross per 24 hour  Intake 236 ml  Output 1250 ml  Net -1014 ml   Filed Weights   08/27/24 2128  Weight: 127 kg    Examination:  Physical Exam Vitals and nursing note reviewed.  Constitutional:      Appearance: She is obese.  HENT:     Head: Normocephalic and atraumatic.  Cardiovascular:     Rate and Rhythm: Normal rate and regular rhythm.  Pulmonary:     Effort: Pulmonary effort is normal.     Breath sounds: Normal breath sounds.  Abdominal:     General: Abdomen is protuberant. Bowel sounds are normal. There is no distension.     Palpations: Abdomen is soft.  Musculoskeletal:     Comments: Right lower leg wrapped in ACE bandage compression wrap Right greater trochanter dressing clean and dry  Skin:    Capillary Refill: Capillary refill  takes less than 2 seconds.  Neurological:     Mental Status: She is alert and oriented to person, place, and time.     Data Reviewed: I have personally reviewed following labs and imaging studies  CBC: Recent Labs  Lab 08/27/24 2351 08/28/24 0635 08/29/24 0228 08/30/24 0702 09/01/24 0647  WBC 13.7* 14.7* 10.2 9.1 9.1  NEUTROABS  --   --   --   --  5.9  HGB 12.5 12.7 11.2* 10.3* 10.0*  HCT 41.7 40.6 35.2* 32.1* 31.0*  MCV 91.9 91.6 91.0 90.2 90.4  PLT 284 247 229 209 215   Basic Metabolic Panel: Recent Labs  Lab 08/27/24 2300 08/27/24 2351 08/29/24 0228 09/01/24 0647  NA 141 141 137 137  K 3.8 3.8 4.2 3.8  CL 104 104 100 99  CO2  --  24 28 29   GLUCOSE 180* 171* 151* 132*  BUN 16 15 10 11   CREATININE 0.70 0.63 0.71 0.55  CALCIUM   --  9.6 8.7* 8.6*   GFR: Estimated Creatinine Clearance: 90.7 mL/min (by C-G formula based on SCr of 0.55 mg/dL). Liver Function Tests: Recent Labs  Lab 08/27/24 2351 09/01/24 0647  AST 18 19  ALT 16 35  ALKPHOS 87 106  BILITOT 0.3 0.6  PROT 6.6 5.7*  ALBUMIN 3.9 2.5*   Coagulation Profile: Recent Labs  Lab 08/27/24 2351  INR 0.9   CBG: Recent Labs  Lab 08/31/24 2030 09/01/24 0753 09/01/24 1159 09/01/24 1714 09/02/24 0746  GLUCAP 133* 140* 145* 132* 144*   Sepsis Labs: Recent Labs  Lab 08/27/24 2301 08/28/24 0635 08/28/24 0700  PROCALCITON  --   --  <0.10  LATICACIDVEN 3.0* 3.3*  --     Recent Results (from the past 240 hours)  Urine Culture (for pregnant, neutropenic or urologic patients or patients with an indwelling urinary catheter)     Status: Abnormal   Collection Time: 08/28/24 12:50 AM   Specimen: Urine, Clean Catch  Result Value Ref Range Status   Specimen Description   Final    URINE, CLEAN CATCH Performed at [facility], [address] [place], [place] [address]    Special Requests   Final    NONE Performed at [facility], [address],  [place], [place] [address]    Culture >=100,000 COLONIES/mL KLEBSIELLA PNEUMONIAE (A)  Final   Report Status 08/30/2024 FINAL  Final   Organism ID,  Bacteria KLEBSIELLA PNEUMONIAE (A)  Final      Susceptibility   Klebsiella pneumoniae - MIC*    AMPICILLIN >=32 RESISTANT Resistant     CEFAZOLIN  (URINE) Value in next row Sensitive      2 SENSITIVEThis is a modified FDA-approved test that has been validated and its performance characteristics determined by the reporting laboratory.  This laboratory is certified under the Clinical Laboratory Improvement Amendments CLIA as qualified to perform high complexity clinical laboratory testing.    CEFEPIME Value in next row Sensitive      2 SENSITIVEThis is a modified FDA-approved test that has been validated and its performance characteristics determined by the reporting laboratory.  This laboratory is certified under the Clinical Laboratory Improvement Amendments CLIA as qualified to perform high complexity clinical laboratory testing.    ERTAPENEM Value in next row Sensitive      2 SENSITIVEThis is a modified FDA-approved test that has been validated and its performance characteristics determined by the reporting laboratory.  This laboratory is certified under the Clinical Laboratory Improvement Amendments CLIA as qualified to perform high complexity clinical laboratory testing.    CEFTRIAXONE Value in next row Sensitive      2 SENSITIVEThis is a modified FDA-approved test that has been validated and its performance characteristics determined by the reporting laboratory.  This laboratory is certified under the Clinical Laboratory Improvement Amendments CLIA as qualified to perform high complexity clinical laboratory testing.    CIPROFLOXACIN Value in next row Sensitive      2 SENSITIVEThis is a modified FDA-approved test that has been validated and its performance characteristics determined by the reporting laboratory.  This laboratory is certified under the  Clinical Laboratory Improvement Amendments CLIA as qualified to perform high complexity clinical laboratory testing.    GENTAMICIN Value in next row Sensitive      2 SENSITIVEThis is a modified FDA-approved test that has been validated and its performance characteristics determined by the reporting laboratory.  This laboratory is certified under the Clinical Laboratory Improvement Amendments CLIA as qualified to perform high complexity clinical laboratory testing.    NITROFURANTOIN  Value in next row Intermediate      2 SENSITIVEThis is a modified FDA-approved test that has been validated and its performance characteristics determined by the reporting laboratory.  This laboratory is certified under the Clinical Laboratory Improvement Amendments CLIA as qualified to perform high complexity clinical laboratory testing.    TRIMETH/SULFA Value in next row Sensitive      2 SENSITIVEThis is a modified FDA-approved test that has been validated and its performance characteristics determined by the reporting laboratory.  This laboratory is certified under the Clinical Laboratory Improvement Amendments CLIA as qualified to perform high complexity clinical laboratory testing.    AMPICILLIN/SULBACTAM Value in next row Sensitive      2 SENSITIVEThis is a modified FDA-approved test that has been validated and its performance characteristics determined by the reporting laboratory.  This laboratory is certified under the Clinical Laboratory Improvement Amendments CLIA as qualified to perform high complexity clinical laboratory testing.    PIP/TAZO Value in next row Sensitive      <=4 SENSITIVEThis is a modified FDA-approved test that has been validated and its performance characteristics determined by the reporting laboratory.  This laboratory is certified under the Clinical Laboratory Improvement Amendments CLIA as qualified to perform high complexity clinical laboratory testing.    MEROPENEM Value in next row Sensitive       <=4 SENSITIVEThis is a modified FDA-approved test  that has been validated and its performance characteristics determined by the reporting laboratory.  This laboratory is certified under the Clinical Laboratory Improvement Amendments CLIA as qualified to perform high complexity clinical laboratory testing.    * >=100,000 COLONIES/mL KLEBSIELLA PNEUMONIAE  Surgical pcr screen     Status: Abnormal   Collection Time: 08/28/24  2:17 PM   Specimen: Nasal Mucosa; Nasal Swab  Result Value Ref Range Status   MRSA, PCR POSITIVE (A) NEGATIVE Final    Comment: RESULT CALLED TO, READ BACK BY AND VERIFIED WITH: EMAILED LISA H. [id number] AT 2040, ADC    Staphylococcus aureus POSITIVE (A) NEGATIVE Final    Comment: (NOTE) The Xpert SA Assay (FDA approved for NASAL specimens in patients 36 years of age and older), is one component of a comprehensive surveillance program. It is not intended to diagnose infection nor to guide or monitor treatment. Performed at [facility], [address], [place], [place] [address]      Scheduled Meds:  cefadroxil  1,000 mg Oral BID   Chlorhexidine  Gluconate Cloth  6 each Topical Daily   docusate sodium   100 mg Oral BID   enoxaparin (LOVENOX) injection  60 mg Subcutaneous Q24H   fesoterodine   4 mg Oral Daily   insulin  aspart  0-9 Units Subcutaneous TID WC   lactulose   20 g Oral Daily   levothyroxine   75 mcg Oral QAC breakfast   mupirocin  ointment   Nasal BID   simvastatin   10 mg Oral QHS   Continuous Infusions:   LOS: 5 days   Time spent: 55 minutes  Camellia Door, DO  [facility]  09/02/2024, 9:27 AM

## 2024-09-02 NOTE — Progress Notes (Signed)
 Occupational Therapy Treatment Patient Details Name: Kelly Taylor MRN: [id number] DOB: 02-09-1951 Today's Date: 09/02/2024   History of present illness Pt is a 73 y.o. female admitted 08/27/24 after ground level fall, sustaining right periprosthetic supracondylar distal femur fracture. Pt s/p ORIF 10/9. PMHx: T2DM, severe OSA on CPAP, mild obstructive airway disease, obesity, chronic back pain, anemia, ascending aorta dilation, depression, GERD, hypothyroidism, IBS, vertigo, HLD, and hx of B TKA.   OT comments  Pt is progressing towards goals this session. Pt engaged in bed mobility with CGA and use of gait belt to manage RLE. Pt required Mod A +2 to step pivot transfer from bed to recliner. Pt required dense verbal cues for safe use of RW and sequencing steps. Pt tolerated transfer well with increased time. Pt engaged in dynamic sitting task to facilitate weight shift towards R side. Pt continues to require Max A for ADL tasks. OT continues to follow patient acutely. Patient will benefit from continued inpatient follow up therapy, <3 hours/day.       If plan is discharge home, recommend the following:  A lot of help with bathing/dressing/bathroom;Two people to help with walking and/or transfers;Assistance with cooking/housework;Assist for transportation;Help with stairs or ramp for entrance   Equipment Recommendations  None recommended by OT    Recommendations for Other Services      Precautions / Restrictions Precautions Precautions: Fall Recall of Precautions/Restrictions: Intact Restrictions Weight Bearing Restrictions Per Provider Order: Yes RLE Weight Bearing Per Provider Order: Weight bearing as tolerated       Mobility Bed Mobility Overal bed mobility: Needs Assistance Bed Mobility: Supine to Sit     Supine to sit: Contact guard, HOB elevated, Used rails     General bed mobility comments: Pt utilized gait belt to moiblize RLE off EOB. Pt required CGA and increased  time.    Transfers Overall transfer level: Needs assistance Equipment used: Rolling walker (2 wheels) Transfers: Sit to/from Stand, Bed to chair/wheelchair/BSC Sit to Stand: Mod assist, +2 physical assistance, From elevated surface     Step pivot transfers: Mod assist, +2 physical assistance     General transfer comment: Pt stood from bed at raised height. Cued for proper hand placement on RW. Pt powered up with Mod A +2 and increased trunk support to facilitate trunk extension. Pt required increased time to achieve upright position and cues for sequencing steps. Dense verbal cues required for safe RW use and to not turn away from walker. Pt able to take small steps with R foot and was able to shuffle foot laterally. Pt with good control to lower to recliner.     Balance Overall balance assessment: Needs assistance Sitting-balance support: No upper extremity supported, Feet supported Sitting balance-Leahy Scale: Good Sitting balance - Comments: Pt sat EOB and engaged in activity to reach outside BOS to facilitate dynamic sitting balance and weight shift onto R side.   Standing balance support: Bilateral upper extremity supported, During functional activity, Reliant on assistive device for balance Standing balance-Leahy Scale: Poor Standing balance comment: Dependent on RW                           ADL either performed or assessed with clinical judgement   ADL Overall ADL's : Needs assistance/impaired Eating/Feeding: Set up;Independent;Sitting   Grooming: Brushing hair;Set up;Sitting   Upper Body Bathing: Set up;Sitting   Lower Body Bathing: Moderate assistance;Sitting/lateral leans   Upper Body Dressing : Set up;Sitting  Lower Body Dressing: Maximal assistance;Sitting/lateral leans   Toilet Transfer: Moderate assistance;+2 for physical assistance;Cueing for sequencing;Rolling walker (2 wheels);Ambulation   Toileting- Clothing Manipulation and Hygiene: Total  assistance       Functional mobility during ADLs: Moderate assistance;+2 for physical assistance;Rolling walker (2 wheels);Cueing for sequencing      Extremity/Trunk Assessment Upper Extremity Assessment Upper Extremity Assessment: Overall WFL for tasks assessed   Lower Extremity Assessment Lower Extremity Assessment: Defer to PT evaluation RLE Deficits / Details: s/p femur ORIF        Vision       Perception     Praxis     Communication Communication Communication: No apparent difficulties   Cognition Arousal: Alert Behavior During Therapy: WFL for tasks assessed/performed Cognition: No apparent impairments                               Following commands: Intact        Cueing   Cueing Techniques: Verbal cues, Gestural cues  Exercises      Shoulder Instructions       General Comments Pt with increased fatigue during ambulation. Explicit verbal cues provided during transfer for sequencing steps and to not hold breath. VSS on RA.    Pertinent Vitals/ Pain       Pain Assessment Pain Assessment: 0-10 Pain Score: 7  Pain Location: R knee Pain Descriptors / Indicators: Grimacing, Guarding, Throbbing Pain Intervention(s): Limited activity within patient's tolerance, Monitored during session, Patient requesting pain meds-RN notified  Home Living                                          Prior Functioning/Environment              Frequency  Min 2X/week        Progress Toward Goals  OT Goals(current goals can now be found in the care plan section)  Progress towards OT goals: Progressing toward goals  Acute Rehab OT Goals Patient Stated Goal: go to rehab OT Goal Formulation: With patient Time For Goal Achievement: 09/12/24 Potential to Achieve Goals: Good ADL Goals Pt Will Perform Grooming: with set-up;sitting Pt Will Perform Lower Body Bathing: with mod assist;sitting/lateral leans Pt Will Perform Lower Body  Dressing: with max assist;with mod assist;sitting/lateral leans Pt Will Transfer to Toilet: with mod assist;with min assist;ambulating;stand pivot transfer;bedside commode Pt Will Perform Toileting - Clothing Manipulation and hygiene: with max assist;with mod assist;sitting/lateral leans;sit to/from stand  Plan      Co-evaluation                 AM-PAC OT 6 Clicks Daily Activity     Outcome Measure   Help from another person eating meals?: A Little Help from another person taking care of personal grooming?: A Little Help from another person toileting, which includes using toliet, bedpan, or urinal?: Total Help from another person bathing (including washing, rinsing, drying)?: A Lot Help from another person to put on and taking off regular upper body clothing?: A Little Help from another person to put on and taking off regular lower body clothing?: A Lot 6 Click Score: 14    End of Session Equipment Utilized During Treatment: Gait belt;Rolling walker (2 wheels)  OT Visit Diagnosis: Unsteadiness on feet (R26.81);Other abnormalities of gait and mobility (R26.89);History of falling (Z91.81);Muscle weakness (  generalized) (M62.81);Pain Pain - Right/Left: Right Pain - part of body: Knee   Activity Tolerance Patient tolerated treatment well   Patient Left in chair;with call bell/phone within reach;with chair alarm set   Nurse Communication Mobility status;Other (comment);Patient requests pain meds (Requested new purewick)        Time: 1008-1030 OT Time Calculation (min): 22 min  Charges: OT General Charges $OT Visit: 1 Visit OT Treatments $Therapeutic Activity: 8-22 mins  Maurilio CROME, OTR/L.  [facility]  Office: [phone]   Maurilio PARAS Lory Galan 09/02/2024, 10:49 AM

## 2024-09-02 NOTE — Plan of Care (Signed)

## 2024-09-02 NOTE — Progress Notes (Signed)
 Orthopaedic Trauma Progress Note  SUBJECTIVE: Doing fairly well this morning.  Pain is controlled.  Notes that the Ace wrap on her leg feels compressive.  Denies any numbness or tingling throughout the right lower extremity.  Is scheduled for discharge to SNF today.  No chest pain. No SOB. No nausea/vomiting. No other complaints.  No family at bedside currently.  OBJECTIVE:  Vitals:   09/01/24 2226 09/02/24 0607  BP: 116/72 130/74  Pulse: 83 95  Resp: 17 17  Temp: 98.5 F (36.9 C) 98.2 F (36.8 C)  SpO2: 94% 90%    Opiates Today (MME): Today's  total administered Morphine  Milligram Equivalents: 0 Opiates Yesterday (MME): Yesterday's total administered Morphine  Milligram Equivalents: 30  General: Laying in bed, no acute distress. Respiratory: No increased work of breathing.  Operative Extremity (RLE): Dressings removed, incisions are clean, dry, intact.  Tenderness with palpation over the knee/distal thigh as expected.  Less tender in the proximal thigh.  No significant calf tenderness.  Ankle DF/PF intact. + EHL/FHL.  Compartment soft and compressible.  Foot warm and well-perfused. + DP pulse  IMAGING: Stable post op imaging right femur.   LABS:  Results for orders placed or performed during the hospital encounter of 08/27/24 (from the past 24 hours)  Glucose, capillary     Status: Abnormal   Collection Time: 09/01/24 11:59 AM  Result Value Ref Range   Glucose-Capillary 145 (H) 70 - 99 mg/dL  Glucose, capillary     Status: Abnormal   Collection Time: 09/01/24  5:14 PM  Result Value Ref Range   Glucose-Capillary 132 (H) 70 - 99 mg/dL  Glucose, capillary     Status: Abnormal   Collection Time: 09/02/24  7:46 AM  Result Value Ref Range   Glucose-Capillary 144 (H) 70 - 99 mg/dL    ASSESSMENT: Kelly Taylor is a 73 y.o. female, 5 Days Post-Op s/p fall Procedures: OPEN REDUCTION INTERNAL FIXATION RIGHT PERIPROSTHETIC DISTAL FEMUR FRACTURE  CV/Blood loss: Acute blood loss anemia,  Hgb 10.0 on 09/01/2024. Hemodynamically stable  PLAN: Weightbearing: WBAT RLE ROM: Unrestricted ROM Incisional and dressing care: Okay to leave incisions open to air.  Change Mepilex dressings as needed Showering: Okay to be begin getting incisions wet Orthopedic device(s): None  Pain management:  1. Tylenol  325-650 mg q 6 hours scheduled 2. Robaxin  500 mg q 6 hours PRN 3. Norco 5-325 or 7.5-325 mg q 4 hours PRN 4. Morphine  0.5-1 mg q 2 hours PRN VTE prophylaxis: Lovenox, SCDs ID: Vancomycin  post op completed Foley/Lines:  No foley, KVO IVFs Impediments to Fracture Healing: Vitamin D  level low normal at 32, will start on daily supplementation  Dispo:  PT/OT evaluation ongoing, recommending SNF at discharge.  Plan for discharge today per medicine team.  I have signed and placed discharge Rx for pain medication DVT prophylaxis and vitamin D  supplementation in patient's chart  D/C recommendations: - Hydrocodone , Robaxin , Tylenol  for pain control - Eliquis 2.5 mg twice daily x 30 days for DVT prophylaxis - Continue 1000 units Vit D supplementation daily x 90 days  Follow - up plan: 2 weeks after d/c for wound check and repeat x-rays   Contact information:  Franky Light MD, Lauraine Moores PA-C. After hours and holidays please check [email or web address] for group call information for [facility]   Lauraine PATRIC Moores, PA-C [phone] (office) [email or web address]

## 2024-09-02 NOTE — Discharge Summary (Signed)
 [facility] Physician Discharge Summary   Patient name: Kelly Taylor  Admit date:     08/27/2024  Discharge date: 09/02/2024  Attending Physician: FELIZ ORTIZ, ABRAHAM [[id number]]  Discharge Physician: Camellia Door   PCP: Gerome Brunet, DO  Admitted From: Home  Disposition:  [facility]  Recommendations for Outpatient Follow-up:  Follow up with PCP in 1-2 weeks Follow up with orthopedics as scheduled  Home Health:No Equipment/Devices: None    Discharge Condition:Stable CODE STATUS:FULL Diet recommendation: Heart Healthy/Diabetic Fluid Restriction: None  Hospital Summary: CC: fall at home onto driveway HPI: Kelly Taylor is a 73 y.o. female with medical history significant of severe OSA on CPAP, mild obstructive airway disease, class III obesity (BMI 43.85), chronic back pain, type 2 diabetes, anemia, ascending aorta dilation, depression, GERD, hypothyroidism, IBS, vertigo, history of bilateral total knee arthroplasty, hyperlipidemia presenting the ED via EMS after a fall tonight.  Patient fell on her right side and hit her head and right knee.  Presented with swelling of right knee and swelling and laceration of lip, nosebleed.  She had 2 glasses of wine tonight.  She was given fentanyl  by EMS and oxygen saturation dropped to the high 80s requiring 2 L [place].  Labs notable for WBC count 13.7, glucose 171, lactic acid 3.0, ethanol level 31.  UA with positive nitrite, negative leukocytes, and microscopy showing 0-5 WBCs and many bacteria.  X-ray of right knee showing transverse fracture just above right knee prosthesis with posterior displacement of the distal femoral fracture.  X-rays of chest and right hip/pelvis showing no acute findings.  CT head/maxillofacial/C-spine and chest/abdomen/pelvis with contrast negative for acute traumatic findings.  Patient was given fentanyl , Dilaudid , Zofran , Compazine, and 1 L IV fluids in the ED.  Orthopedics consulted and [facility] called to admit.    Patient states she was in her usual state of health, feeling well.  She was walking on her driveway when she accidentally tripped on a paver and fell.  She reports falling on her right knee and hitting the right side of her face/head on floor.  Denies loss of consciousness.  She was not able to get up and walk due to severe pain in her right knee.  She feels nauseous due to severe pain whenever she tries to move her leg.  Denies vomiting or abdominal pain.  Denies chest pain or shortness of breath.  Denies any urinary symptoms.  Significant Events: Admitted 08/27/2024 for right periprosthetic femur fracture 08-27-2024 seen by ortho consult 08-28-2024 taken to OR for ORIF right supracondylar distal femur fracture  Admission Labs: Na 141, K 3.8, CO2 of 24, BUN 15, scr 0.63, glu 171 T. Prot 6.6, alb 3.9, AST 18, ALT 16, alk phos 87, t. Bili 0.3 WBC 13.7, HgB 12.5, plt 284 ETOH 31 INR 0.9 UA spg >1.046, POSITIVE nitrite, negative LE, WBC 0-5, many bacteria Procalcitonin < 0.10  Admission Imaging Studies:   Significant Labs: Urine cx Klebsiella pneumonaie A1c of 6.8%  Significant Imaging Studies: 08-30-2024 CXR No active disease.   Antibiotic Therapy: Anti-infectives (From admission, onward)    Start     Dose/Rate Route Frequency Ordered Stop   08/29/24 0400  vancomycin  (VANCOCIN ) IVPB 1000 mg/200 mL premix        1,000 mg 200 mL/hr over 60 Minutes Intravenous Every 12 hours 08/29/24 0028 08/29/24 0716   08/28/24 1705  vancomycin  (VANCOCIN ) powder  Status:  Discontinued          As needed 08/28/24 1705  08/28/24 1752       Procedures: 08-28-2024 taken to OR for ORIF right supracondylar distal femur fracture  Consultants: ortho   Hospital Course by Problem: * Peri-prosthetic fracture of shaft of femur - right 08/30/24 POD #2 from ORIF of peri-prosthetic fracture. Has seen PT/OT. Needs to see CM/TOC about SNF placement.  08/31/24 POD #3. Needs SNF. CM/TOC consulted to help  with SNF placement. Pt has traditional medicare A/B.  09/01/24 POD #4. Pt has selected [facility]. Bed available tomorrow.  09/02/24 POD #5. DC to [facility]. Ortho has discharged her on Eliquis 2.5 mg bid for DVT prophylaxis.  Ortho will decide duration of Eliquis after pt is seen in f/u clinic.     UTI due to Klebsiella species - pt has tolerated Rocephin and Duricef withOUT any signs of allergic reaction 08/30/24 start IV rocephin. Pharmacy has verified that pt has taken cephalosporins in the past without difficulty.  08/31/24 continue with IV Rocephin. Day #2  09/01/24 Klebsiella sensitive to Ancef . Change to po duricef.  09/02/24 tolerating Duricef. Complete 3 more days. Total of 5 days of abx therapy. Susceptibility data from last 90 days. Collected Specimen Info Organism AMPICILLIN AMPICILLIN/SULBACTAM CEFAZOLIN  (URINE) CEFEPIME CEFTRIAXONE Ciprofloxacin Ertapenem Gentamicin Susc lslt Meropenem Nitrofurantoin  Susc lslt Piperacillin + Tazobactam  08/28/24 Urine, Clean Catch Klebsiella pneumoniae  R  S S  S  S  S  S  S  S  I S  07/04/24 Urine, Clean Catch Escherichia coli  S  S S  S  S  S  S  S  S  S S   Collected Specimen Info Organism Trimethoprim/Sulfa  08/28/24 Urine, Clean Catch Klebsiella pneumoniae  S  07/04/24 Urine, Clean Catch Escherichia coli  S     Postoperative anemia due to acute blood loss 08/30/24 HgB 10.3 g/dl. Preop HgB of 13.6. continue to monitor.   08/31/24 repeat CBC in AM.  09/01/24 HgB 10 g/dl. Stable.  09/02/24 stable. HgB @ 10 g/dl.  Acute respiratory failure with hypoxia (HCC)-resolved as of 09/02/2024 08/30/24 initial CXR clear. Repeat CXR. Pulmonary toileting.  08/31/24 last night CXR negative for pneumonia. Has been on lovenox for DVT prophylaxis since admission. Doubt PE. Pulmonary toileting. Wean O2 to RA. Confirmed with pt that she does not use home O2.  09/01/24 weaned to RA. Continue with pulmonary toileting.  09/02/24  resolved. Continue with pulmonary toileting at SNF to prevent pneumonia.    Leukocytosis-resolved as of 08/31/2024 08/30/24 resolved. likely due to acute fracture, UTI.    OSA (obstructive sleep apnea) 09/01/24 pt non-compliant with using CPAP.  09/02/24 pt non-compliant with using CPAP.   Type 2 diabetes mellitus with other specified complication (HCC) 08/30/24 on SSI.  08/31/24 continue SSI. CBG acceptable ranges.  09/01/24 stable.  09/02/24 restart metformin  at discharge. 500 mg bid.     Other hyperlipidemia 08/30/24 continue zocor   08/31/24 stable on zocor .  09/01/24 on zocor .  09/02/24 continue zocor  10 mg qhs    Morbid obesity (HCC) 08/30/24 Body mass index is 43.85 kg/m.      Discharge Diagnoses:  Principal Problem:   Peri-prosthetic fracture of shaft of femur - right Active Problems:   Postoperative anemia due to acute blood loss   UTI due to Klebsiella species - pt has tolerated Rocephin and Duricef withOUT any signs of allergic reaction   Morbid obesity (HCC)   Other hyperlipidemia   Type 2 diabetes mellitus with other specified complication (HCC)   OSA (obstructive sleep apnea)  Discharge Instructions  Discharge Instructions     Call MD for:  difficulty breathing, headache or visual disturbances   Complete by: As directed    Call MD for:  extreme fatigue   Complete by: As directed    Call MD for:  hives   Complete by: As directed    Call MD for:  persistant dizziness or light-headedness   Complete by: As directed    Call MD for:  persistant nausea and vomiting   Complete by: As directed    Call MD for:  redness, tenderness, or signs of infection (pain, swelling, redness, odor or green/yellow discharge around incision site)   Complete by: As directed    Call MD for:  severe uncontrolled pain   Complete by: As directed    Call MD for:  temperature >100.4   Complete by: As directed    Diet - low sodium heart healthy   Complete by:  As directed    Diet Carb Modified   Complete by: As directed    Increase activity slowly   Complete by: As directed    Leave dressing on - Keep it clean, dry, and intact until clinic visit   Complete by: As directed    Reinforce dressing as needed until seen in orthopedic clinic   Weight bearing as tolerated   Complete by: As directed    Laterality: right   Extremity: Lower      Allergies as of 09/02/2024       Reactions   Oxycodone  Shortness Of Breath, Other (See Comments)   Turned purple and could not breathe- with an injection; Can take vicodin/morphine    Penicillins Anaphylaxis, Hives, Other (See Comments)   Pt was a baby when this happened - has taken cephalexin  and cefazolin  in the past Has patient had a PCN reaction causing immediate rash, facial/tongue/throat swelling, SOB or lightheadedness with hypotension: Yes Has patient had a PCN reaction causing severe rash involving mucus membranes or skin necrosis: Unknown Has patient had a PCN reaction that required hospitalization: Unknown Has patient had a PCN reaction occurring within the last 10 years: No   Trazodone Other (See Comments)   Nightmares   Lyrica [pregabalin] Other (See Comments)   Caused nightmares   Nabumetone Nausea And Vomiting   Sulfa Antibiotics Other (See Comments)   Caused fevers        Medication List     PAUSE taking these medications    FLEX-A-MIN JOINT FLEX PO Wait to take this until your doctor or other care provider tells you to start again. Hold until patient is out of SNF Take 1 tablet by mouth in the morning. Instaflex Advanced Joint Support       STOP taking these medications    traMADol  50 MG tablet Commonly known as: ULTRAM        TAKE these medications    acetaminophen  325 MG tablet Commonly known as: TYLENOL  Take 1-2 tablets (325-650 mg total) by mouth every 6 (six) hours as needed for mild pain (pain score 1-3), fever or headache (or temp > 100.5).   albuterol   108 (90 Base) MCG/ACT inhaler Commonly known as: VENTOLIN  HFA Inhale 2 puffs into the lungs every 4 (four) hours as needed for wheezing or shortness of breath.   apixaban 2.5 MG Tabs tablet Commonly known as: Eliquis Take 1 tablet (2.5 mg total) by mouth 2 (two) times daily.   aspirin  EC 81 MG tablet Take 81 mg by mouth daily. Swallow whole.  cefadroxil 500 MG capsule Commonly known as: DURICEF Take 2 capsules (1,000 mg total) by mouth 2 (two) times daily for 3 days.   diclofenac sodium 1 % Gel Commonly known as: VOLTAREN Apply 2 g topically 4 (four) times daily as needed (for pain).   docusate sodium  100 MG capsule Commonly known as: COLACE Take 200 mg by mouth every evening.   FISH OIL PO Take 2 capsules by mouth daily after breakfast.   gabapentin 100 MG capsule Commonly known as: NEURONTIN Take 100 mg by mouth daily as needed.   HYDROcodone -acetaminophen  7.5-325 MG tablet Commonly known as: NORCO Take 1-2 tablets by mouth every 4 (four) hours as needed for severe pain (pain score 7-10) or moderate pain (pain score 4-6).   hydrocortisone  2.5 % rectal cream Commonly known as: ANUSOL -HC Place 1 Application rectally 2 (two) times daily. What changed:  when to take this reasons to take this   ipratropium 0.06 % nasal spray Commonly known as: ATROVENT Place 2 sprays into both nostrils 4 (four) times daily as needed (for seasonal allergies).   Lactulose  20 GM/30ML Soln Take 15 mLs (10 g total) by mouth daily as needed (constipation).   magnesium  oxide 400 (240 Mg) MG tablet Commonly known as: MAG-OX Take 400 mg by mouth every evening.   metFORMIN  500 MG 24 hr tablet Commonly known as: GLUCOPHAGE -XR Take 500 mg by mouth in the morning and at bedtime.   methocarbamol  500 MG tablet Commonly known as: ROBAXIN  Take 1 tablet (500 mg total) by mouth every 6 (six) hours as needed for muscle spasms.   omeprazole  20 MG capsule Commonly known as: PRILOSEC Take 20 mg  by mouth daily as needed.   ondansetron  4 MG tablet Commonly known as: ZOFRAN  Take 1 tablet (4 mg total) by mouth every 6 (six) hours as needed for nausea or vomiting.   polyethylene glycol 17 g packet Commonly known as: MIRALAX  / GLYCOLAX  Take 17 g by mouth daily.   PROBIOTIC PO Take 1 capsule by mouth every evening.   simvastatin  10 MG tablet Commonly known as: ZOCOR  Take 10 mg by mouth at bedtime.   solifenacin 10 MG tablet Commonly known as: VESICARE Take 10 mg by mouth in the morning.   Synthroid  75 MCG tablet Generic drug: levothyroxine  Take 75 mcg by mouth daily before breakfast.   Womens Multivitamin Tabs Take 1 tablet by mouth daily with breakfast.               Discharge Care Instructions  (From admission, onward)           Start     Ordered   09/02/24 0000  Weight bearing as tolerated       Question Answer Comment  Laterality right   Extremity Lower      09/02/24 0747   09/02/24 0000  Leave dressing on - Keep it clean, dry, and intact until clinic visit       Comments: Reinforce dressing as needed until seen in orthopedic clinic   09/02/24 0747            Contact information for follow-up providers     Haddix, Franky SQUIBB, MD. Schedule an appointment as soon as possible for a visit in 2 week(s).   Specialty: Orthopedic Surgery Why: for wound check and repeat x-rays Contact information: [address] [place] [address] [phone]              Contact information for after-discharge care  Destination     [facility] .   Service: Skilled Nursing Contact information: [address] [place]  [address] [phone]                    Allergies  Allergen Reactions   Oxycodone  Shortness Of Breath and Other (See Comments)    Turned purple and could not breathe- with an injection; Can take vicodin/morphine     Penicillins Anaphylaxis, Hives and Other (See Comments)    Pt was a baby when  this happened - has taken cephalexin  and cefazolin  in the past Has patient had a PCN reaction causing immediate rash, facial/tongue/throat swelling, SOB or lightheadedness with hypotension: Yes Has patient had a PCN reaction causing severe rash involving mucus membranes or skin necrosis: Unknown Has patient had a PCN reaction that required hospitalization: Unknown Has patient had a PCN reaction occurring within the last 10 years: No    Trazodone Other (See Comments)    Nightmares   Lyrica [Pregabalin] Other (See Comments)    Caused nightmares   Nabumetone Nausea And Vomiting   Sulfa Antibiotics Other (See Comments)    Caused fevers    Discharge Exam: Vitals:   09/01/24 2226 09/02/24 0607  BP: 116/72 130/74  Pulse: 83 95  Resp: 17 17  Temp: 98.5 F (36.9 C) 98.2 F (36.8 C)  SpO2: 94% 90%    Physical Exam Vitals and nursing note reviewed.  Constitutional:      Appearance: She is obese.  HENT:     Head: Normocephalic and atraumatic.  Cardiovascular:     Rate and Rhythm: Normal rate and regular rhythm.  Pulmonary:     Effort: Pulmonary effort is normal.     Breath sounds: Normal breath sounds.  Abdominal:     General: Abdomen is protuberant. Bowel sounds are normal. There is no distension.     Palpations: Abdomen is soft.  Musculoskeletal:     Comments: Right lower leg wrapped in ACE bandage compression wrap Right greater trochanter dressing clean and dry  Skin:    Capillary Refill: Capillary refill takes less than 2 seconds.  Neurological:     Mental Status: She is alert and oriented to person, place, and time.     The results of significant diagnostics from this hospitalization (including imaging, microbiology, ancillary and laboratory) are listed below for reference.    Microbiology: Recent Results (from the past 240 hours)  Urine Culture (for pregnant, neutropenic or urologic patients or patients with an indwelling urinary catheter)     Status: Abnormal    Collection Time: 08/28/24 12:50 AM   Specimen: Urine, Clean Catch  Result Value Ref Range Status   Specimen Description   Final    URINE, CLEAN CATCH Performed at [facility], [address], [place], [place] [address]    Special Requests   Final    NONE Performed at [facility], [address], [place], [place] [address]    Culture >=100,000 COLONIES/mL KLEBSIELLA PNEUMONIAE (A)  Final   Report Status 08/30/2024 FINAL  Final   Organism ID, Bacteria KLEBSIELLA PNEUMONIAE (A)  Final      Susceptibility   Klebsiella pneumoniae - MIC*    AMPICILLIN >=32 RESISTANT Resistant     CEFAZOLIN  (URINE) Value in next row Sensitive      2 SENSITIVEThis is a modified FDA-approved test that has been validated and its performance characteristics determined by the reporting laboratory.  This laboratory is certified under the  Clinical Laboratory Improvement Amendments CLIA as qualified to perform high complexity clinical laboratory testing.    CEFEPIME Value in next row Sensitive      2 SENSITIVEThis is a modified FDA-approved test that has been validated and its performance characteristics determined by the reporting laboratory.  This laboratory is certified under the Clinical Laboratory Improvement Amendments CLIA as qualified to perform high complexity clinical laboratory testing.    ERTAPENEM Value in next row Sensitive      2 SENSITIVEThis is a modified FDA-approved test that has been validated and its performance characteristics determined by the reporting laboratory.  This laboratory is certified under the Clinical Laboratory Improvement Amendments CLIA as qualified to perform high complexity clinical laboratory testing.    CEFTRIAXONE Value in next row Sensitive      2 SENSITIVEThis is a modified FDA-approved test that has been validated and its performance characteristics determined by the reporting laboratory.  This laboratory is certified under the Clinical  Laboratory Improvement Amendments CLIA as qualified to perform high complexity clinical laboratory testing.    CIPROFLOXACIN Value in next row Sensitive      2 SENSITIVEThis is a modified FDA-approved test that has been validated and its performance characteristics determined by the reporting laboratory.  This laboratory is certified under the Clinical Laboratory Improvement Amendments CLIA as qualified to perform high complexity clinical laboratory testing.    GENTAMICIN Value in next row Sensitive      2 SENSITIVEThis is a modified FDA-approved test that has been validated and its performance characteristics determined by the reporting laboratory.  This laboratory is certified under the Clinical Laboratory Improvement Amendments CLIA as qualified to perform high complexity clinical laboratory testing.    NITROFURANTOIN  Value in next row Intermediate      2 SENSITIVEThis is a modified FDA-approved test that has been validated and its performance characteristics determined by the reporting laboratory.  This laboratory is certified under the Clinical Laboratory Improvement Amendments CLIA as qualified to perform high complexity clinical laboratory testing.    TRIMETH/SULFA Value in next row Sensitive      2 SENSITIVEThis is a modified FDA-approved test that has been validated and its performance characteristics determined by the reporting laboratory.  This laboratory is certified under the Clinical Laboratory Improvement Amendments CLIA as qualified to perform high complexity clinical laboratory testing.    AMPICILLIN/SULBACTAM Value in next row Sensitive      2 SENSITIVEThis is a modified FDA-approved test that has been validated and its performance characteristics determined by the reporting laboratory.  This laboratory is certified under the Clinical Laboratory Improvement Amendments CLIA as qualified to perform high complexity clinical laboratory testing.    PIP/TAZO Value in next row Sensitive       <=4 SENSITIVEThis is a modified FDA-approved test that has been validated and its performance characteristics determined by the reporting laboratory.  This laboratory is certified under the Clinical Laboratory Improvement Amendments CLIA as qualified to perform high complexity clinical laboratory testing.    MEROPENEM Value in next row Sensitive      <=4 SENSITIVEThis is a modified FDA-approved test that has been validated and its performance characteristics determined by the reporting laboratory.  This laboratory is certified under the Clinical Laboratory Improvement Amendments CLIA as qualified to perform high complexity clinical laboratory testing.    * >=100,000 COLONIES/mL KLEBSIELLA PNEUMONIAE  Surgical pcr screen     Status: Abnormal   Collection Time: 08/28/24  2:17 PM   Specimen: Nasal Mucosa; Nasal Swab  Result Value Ref Range Status   MRSA, PCR POSITIVE (A) NEGATIVE Final    Comment: RESULT CALLED TO, READ BACK BY AND VERIFIED WITH: EMAILED LISA H. [id number] AT 2040, ADC    Staphylococcus aureus POSITIVE (A) NEGATIVE Final    Comment: (NOTE) The Xpert SA Assay (FDA approved for NASAL specimens in patients 49 years of age and older), is one component of a comprehensive surveillance program. It is not intended to diagnose infection nor to guide or monitor treatment. Performed at [facility], [address] [place], [place] [address]      Labs: Basic Metabolic Panel: Recent Labs  Lab 08/27/24 2300 08/27/24 2351 08/29/24 0228 09/01/24 0647  NA 141 141 137 137  K 3.8 3.8 4.2 3.8  CL 104 104 100 99  CO2  --  24 28 29   GLUCOSE 180* 171* 151* 132*  BUN 16 15 10 11   CREATININE 0.70 0.63 0.71 0.55  CALCIUM   --  9.6 8.7* 8.6*   Liver Function Tests: Recent Labs  Lab 08/27/24 2351 09/01/24 0647  AST 18 19  ALT 16 35  ALKPHOS 87 106  BILITOT 0.3 0.6  PROT 6.6 5.7*  ALBUMIN 3.9 2.5*   CBC: Recent Labs  Lab 08/27/24 2351 08/28/24 0635 08/29/24 0228  08/30/24 0702 09/01/24 0647  WBC 13.7* 14.7* 10.2 9.1 9.1  NEUTROABS  --   --   --   --  5.9  HGB 12.5 12.7 11.2* 10.3* 10.0*  HCT 41.7 40.6 35.2* 32.1* 31.0*  MCV 91.9 91.6 91.0 90.2 90.4  PLT 284 247 229 209 215   CBG: Recent Labs  Lab 08/31/24 2030 09/01/24 0753 09/01/24 1159 09/01/24 1714 09/02/24 0746  GLUCAP 133* 140* 145* 132* 144*   Urinalysis    Component Value Date/Time   COLORURINE YELLOW 08/28/2024 0050   APPEARANCEUR CLEAR 08/28/2024 0050   LABSPEC >1.046 (H) 08/28/2024 0050   PHURINE 5.0 08/28/2024 0050   GLUCOSEU NEGATIVE 08/28/2024 0050   HGBUR NEGATIVE 08/28/2024 0050   BILIRUBINUR NEGATIVE 08/28/2024 0050   KETONESUR 5 (A) 08/28/2024 0050   PROTEINUR NEGATIVE 08/28/2024 0050   UROBILINOGEN 0.2 07/21/2014 1118   NITRITE POSITIVE (A) 08/28/2024 0050   LEUKOCYTESUR NEGATIVE 08/28/2024 0050   Sepsis Labs Recent Labs  Lab 08/28/24 0635 08/28/24 0700 08/29/24 0228 08/30/24 0702 09/01/24 0647  PROCALCITON  --  <0.10  --   --   --   WBC 14.7*  --  10.2 9.1 9.1    Procedures/Studies: DG CHEST PORT 1 VIEW Result Date: 08/30/2024 CLINICAL DATA:  Hypoxia EXAM: PORTABLE CHEST 1 VIEW COMPARISON:  08/28/2019 FINDINGS: Cardiac shadow is stable. Tortuous thoracic aorta is seen. Lungs are clear. No bony abnormality is noted. Surgical changes in the shoulders are noted bilaterally. IMPRESSION: No active disease. Electronically Signed   By: Oneil Devonshire M.D.   On: 08/30/2024 20:08   DG FEMUR PORT, MIN 2 VIEWS RIGHT Result Date: 08/28/2024 CLINICAL DATA:  Postop femur fracture fixation. EXAM: RIGHT FEMUR PORTABLE 2 VIEW COMPARISON:  Preoperative imaging FINDINGS: Lateral plate and screw fixation of periprosthetic distal femur fracture. Decreased angulation from preoperative imaging. Previous knee and hip arthroplasty. Recent postsurgical change includes air and edema in the soft tissues. IMPRESSION: ORIF of periprosthetic distal femur fracture. Electronically Signed    By: Andrea Gasman M.D.   On: 08/28/2024 20:46   DG FEMUR, MIN 2 VIEWS RIGHT Result Date: 08/28/2024 CLINICAL DATA:  Elective surgery. EXAM: RIGHT FEMUR 2 VIEWS COMPARISON:  Preoperative imaging FINDINGS: Six fluoroscopic spot views of the femur submitted from the operating room. Plate and screw fixation distal femur fracture. Previous knee arthroplasty. Fluoroscopy time 44.6 seconds. Dose 3.6214 mGy. IMPRESSION: Intraoperative fluoroscopy during distal femur fracture ORIF. Electronically Signed   By: Andrea Gasman M.D.   On: 08/28/2024 20:43   DG C-Arm 1-60 Min-No Report Result Date: 08/28/2024 Fluoroscopy was utilized by the requesting physician.  No radiographic interpretation.   DG C-Arm 1-60 Min-No Report Result Date: 08/28/2024 Fluoroscopy was utilized by the requesting physician.  No radiographic interpretation.   DG Wrist 2 Views Right Result Date: 08/28/2024 EXAM: 2 VIEW(S) XRAY OF THE RIGHT WRIST 08/28/2024 07:41:00 AM COMPARISON: None available. CLINICAL HISTORY: 73 year old female. Fall on right side, hit head and right knee. Swelling right knee, lip laceration, nose bleed. Cannot bear weight right leg. History of DM. FINDINGS: BONES AND JOINTS: Chronic degeneration about the right wrist including severe 1st CMC joint osteoarthritis with subchondral sclerosis and osteophytosis. Chondrocalcinosis suspected at the wrist. No acute fracture or dislocation identified. SOFT TISSUES: Nonspecific generalized soft tissue swelling. IMPRESSION: 1. No acute fracture or dislocation. Nonspecific generalized soft tissue swelling. 2. Advanced degeneration, Severe first carpometacarpal osteoarthritis. Electronically signed by: Helayne Hurst MD 08/28/2024 08:18 AM EDT RP Workstation: [id number]   CT CHEST ABDOMEN PELVIS W CONTRAST Result Date: 08/28/2024 CLINICAL DATA:  Polytrauma, blunt EXAM: CT CHEST, ABDOMEN, AND PELVIS WITH CONTRAST TECHNIQUE: Multidetector CT imaging of the chest, abdomen and  pelvis was performed following the standard protocol during bolus administration of intravenous contrast. RADIATION DOSE REDUCTION: This exam was performed according to the departmental dose-optimization program which includes automated exposure control, adjustment of the mA and/or kV according to patient size and/or use of iterative reconstruction technique. CONTRAST:  OMNIPAQUE  IOHEXOL  300 MG/ML  SOLN COMPARISON:  CT abdomen pelvis 07/10/2024, chest x-ray 06/18/2023 FINDINGS: CHEST: Cardiovascular: No aortic injury. Aneurysmal ascending thoracic aorta (4.1 cm). The thoracic aorta is normal in caliber. The heart is normal in size. No significant pericardial effusion. Mediastinum/Nodes: No pneumomediastinum. No mediastinal hematoma. The esophagus is unremarkable. The thyroid  is unremarkable. The central airways are patent. No mediastinal, hilar, or axillary lymphadenopathy. Lungs/Pleura: No focal consolidation. No pulmonary nodule. No pulmonary mass. No pulmonary contusion or laceration. No pneumatocele formation. No pleural effusion. No pneumothorax. No hemothorax. Musculoskeletal/Chest wall: No chest wall mass. No acute rib or sternal fracture. No spinal fracture. Total right shoulder arthroplasty. Multilevel degenerative changes of the spine. ABDOMEN / PELVIS: Hepatobiliary: Not enlarged. No focal lesion. No laceration or subcapsular hematoma. Status post cholecystectomy. No biliary ductal dilatation. Mitral annular calcification. Pancreas: Normal pancreatic contour. No main pancreatic duct dilatation. Spleen: Not enlarged. No focal lesion. No laceration, subcapsular hematoma, or vascular injury. Adrenals/Urinary Tract: No nodularity bilaterally. Bilateral kidneys enhance symmetrically. No hydronephrosis. No contusion, laceration, or subcapsular hematoma. Fluid is lesion of left kidney likely represents a simple renal cyst. Simple renal cysts, in the absence of clinically indicated signs/symptoms, require  no independent follow-up. No injury to the vascular structures or collecting systems. No hydroureter. The urinary bladder is unremarkable. On delayed imaging, there is no urothelial wall thickening and there are no filling defects in the opacified portions of the bilateral collecting systems or ureters. Stomach/Bowel: No small or large bowel wall thickening or dilatation. The appendix is not definitely identified with no inflammatory changes in the right lower quadrant to suggest acute appendicitis. Vasculature/Lymphatics: Mild atherosclerotic plaque. No abdominal aorta or iliac aneurysm. No active contrast extravasation or  pseudoaneurysm. No abdominal, pelvic, inguinal lymphadenopathy. Reproductive: Normal. Other: No simple free fluid ascites. No pneumoperitoneum. No hemoperitoneum. No mesenteric hematoma identified. No organized fluid collection. Musculoskeletal: No significant soft tissue hematoma. Small fat containing left inguinal hernia. No acute pelvic fracture. Total right hip arthroplasty. No spinal fracture. Multilevel degenerative change of the spine. Appear L4 posterolateral and interbody surgical hardware fusion. Neural stimulator with the anterior to the sacrum. Other ports and devices: None. IMPRESSION: 1. No acute intrathoracic, intra-abdominal, intrapelvic traumatic injury. 2. No acute fracture or traumatic malalignment of the thoracic or lumbar spine. 3. Aneurysmal ascending thoracic aorta (4.1 cm). Recommend annual imaging followup by CTA or MRA. This recommendation follows 2010 ACCF/AHA/AATS/ACR/ASA/SCA/SCAI/SIR/STS/SVM Guidelines for the Diagnosis and Management of Patients with Thoracic Aortic Disease. Circulation. 2010; 121: Z733-z630. Aortic aneurysm NOS (ICD10-I71.9). Electronically Signed   By: Morgane  Naveau M.D.   On: 08/28/2024 00:24   CT HEAD WO CONTRAST Result Date: 08/28/2024 CLINICAL DATA:  Head trauma, moderate-severe; Polytrauma, blunt; Facial trauma, blunt. Fall EXAM: CT HEAD  WITHOUT CONTRAST CT MAXILLOFACIAL WITHOUT CONTRAST CT CERVICAL SPINE WITHOUT CONTRAST TECHNIQUE: Multidetector CT imaging of the head, cervical spine, and maxillofacial structures were performed using the standard protocol without intravenous contrast. Multiplanar CT image reconstructions of the cervical spine and maxillofacial structures were also generated. RADIATION DOSE REDUCTION: This exam was performed according to the departmental dose-optimization program which includes automated exposure control, adjustment of the mA and/or kV according to patient size and/or use of iterative reconstruction technique. COMPARISON:  CT head 02/11/2014 FINDINGS: CT HEAD FINDINGS Brain: Trace patchy and confluent areas of decreased attenuation are noted throughout the deep and periventricular white matter of the cerebral hemispheres bilaterally, compatible with chronic microvascular ischemic disease. No evidence of large-territorial acute infarction. No parenchymal hemorrhage. No mass lesion. No extra-axial collection. No mass effect or midline shift. No hydrocephalus. Basilar cisterns are patent. Vascular: No hyperdense vessel. Skull: No acute fracture or focal lesion. Other: None. CT MAXILLOFACIAL FINDINGS Osseous: No fracture or mandibular dislocation. No destructive process. Sinuses/Orbits: Paranasal sinuses and mastoid air cells are clear. The orbits are unremarkable. Soft tissues: Negative. CT CERVICAL SPINE FINDINGS Alignment: Grade 1 anterolisthesis of C4 on C5. Grade 1 anterolisthesis of C6 on C7. Skull base and vertebrae: Multilevel severe degenerative changes of the spine. No severe osseous neural foraminal or central canal stenosis. No acute fracture. No aggressive appearing focal osseous lesion or focal pathologic process. Soft tissues and spinal canal: No prevertebral fluid or swelling. No visible canal hematoma. Upper chest: Unremarkable. Other: Aneurysmal ascending thoracic aorta measuring up to 4.1 cm in  caliber. IMPRESSION: 1. No acute intracranial abnormality. 2.  No acute displaced facial fracture. 3. No acute displaced fracture or traumatic listhesis of the cervical spine. 4. Aneurysmal ascending thoracic aorta (4.1 cm). Recommend annual imaging followup by CTA or MRA. This recommendation follows 2010 ACCF/AHA/AATS/ACR/ASA/SCA/SCAI/SIR/STS/SVM Guidelines for the Diagnosis and Management of Patients with Thoracic Aortic Disease. Circulation. 2010; 121: [id number]. Aortic aneurysm NOS (ICD10-I71.9). Electronically Signed   By: Morgane  Naveau M.D.   On: 08/28/2024 00:16   CT MAXILLOFACIAL WO CONTRAST Result Date: 08/28/2024 CLINICAL DATA:  Head trauma, moderate-severe; Polytrauma, blunt; Facial trauma, blunt. Fall EXAM: CT HEAD WITHOUT CONTRAST CT MAXILLOFACIAL WITHOUT CONTRAST CT CERVICAL SPINE WITHOUT CONTRAST TECHNIQUE: Multidetector CT imaging of the head, cervical spine, and maxillofacial structures were performed using the standard protocol without intravenous contrast. Multiplanar CT image reconstructions of the cervical spine and maxillofacial structures were also generated. RADIATION DOSE REDUCTION: This exam was  performed according to the departmental dose-optimization program which includes automated exposure control, adjustment of the mA and/or kV according to patient size and/or use of iterative reconstruction technique. COMPARISON:  CT head 02/11/2014 FINDINGS: CT HEAD FINDINGS Brain: Trace patchy and confluent areas of decreased attenuation are noted throughout the deep and periventricular white matter of the cerebral hemispheres bilaterally, compatible with chronic microvascular ischemic disease. No evidence of large-territorial acute infarction. No parenchymal hemorrhage. No mass lesion. No extra-axial collection. No mass effect or midline shift. No hydrocephalus. Basilar cisterns are patent. Vascular: No hyperdense vessel. Skull: No acute fracture or focal lesion. Other: None. CT MAXILLOFACIAL  FINDINGS Osseous: No fracture or mandibular dislocation. No destructive process. Sinuses/Orbits: Paranasal sinuses and mastoid air cells are clear. The orbits are unremarkable. Soft tissues: Negative. CT CERVICAL SPINE FINDINGS Alignment: Grade 1 anterolisthesis of C4 on C5. Grade 1 anterolisthesis of C6 on C7. Skull base and vertebrae: Multilevel severe degenerative changes of the spine. No severe osseous neural foraminal or central canal stenosis. No acute fracture. No aggressive appearing focal osseous lesion or focal pathologic process. Soft tissues and spinal canal: No prevertebral fluid or swelling. No visible canal hematoma. Upper chest: Unremarkable. Other: Aneurysmal ascending thoracic aorta measuring up to 4.1 cm in caliber. IMPRESSION: 1. No acute intracranial abnormality. 2.  No acute displaced facial fracture. 3. No acute displaced fracture or traumatic listhesis of the cervical spine. 4. Aneurysmal ascending thoracic aorta (4.1 cm). Recommend annual imaging followup by CTA or MRA. This recommendation follows 2010 ACCF/AHA/AATS/ACR/ASA/SCA/SCAI/SIR/STS/SVM Guidelines for the Diagnosis and Management of Patients with Thoracic Aortic Disease. Circulation. 2010; 121: [id number]. Aortic aneurysm NOS (ICD10-I71.9). Electronically Signed   By: Morgane  Naveau M.D.   On: 08/28/2024 00:16   CT CERVICAL SPINE WO CONTRAST Result Date: 08/28/2024 CLINICAL DATA:  Head trauma, moderate-severe; Polytrauma, blunt; Facial trauma, blunt. Fall EXAM: CT HEAD WITHOUT CONTRAST CT MAXILLOFACIAL WITHOUT CONTRAST CT CERVICAL SPINE WITHOUT CONTRAST TECHNIQUE: Multidetector CT imaging of the head, cervical spine, and maxillofacial structures were performed using the standard protocol without intravenous contrast. Multiplanar CT image reconstructions of the cervical spine and maxillofacial structures were also generated. RADIATION DOSE REDUCTION: This exam was performed according to the departmental dose-optimization program  which includes automated exposure control, adjustment of the mA and/or kV according to patient size and/or use of iterative reconstruction technique. COMPARISON:  CT head 02/11/2014 FINDINGS: CT HEAD FINDINGS Brain: Trace patchy and confluent areas of decreased attenuation are noted throughout the deep and periventricular white matter of the cerebral hemispheres bilaterally, compatible with chronic microvascular ischemic disease. No evidence of large-territorial acute infarction. No parenchymal hemorrhage. No mass lesion. No extra-axial collection. No mass effect or midline shift. No hydrocephalus. Basilar cisterns are patent. Vascular: No hyperdense vessel. Skull: No acute fracture or focal lesion. Other: None. CT MAXILLOFACIAL FINDINGS Osseous: No fracture or mandibular dislocation. No destructive process. Sinuses/Orbits: Paranasal sinuses and mastoid air cells are clear. The orbits are unremarkable. Soft tissues: Negative. CT CERVICAL SPINE FINDINGS Alignment: Grade 1 anterolisthesis of C4 on C5. Grade 1 anterolisthesis of C6 on C7. Skull base and vertebrae: Multilevel severe degenerative changes of the spine. No severe osseous neural foraminal or central canal stenosis. No acute fracture. No aggressive appearing focal osseous lesion or focal pathologic process. Soft tissues and spinal canal: No prevertebral fluid or swelling. No visible canal hematoma. Upper chest: Unremarkable. Other: Aneurysmal ascending thoracic aorta measuring up to 4.1 cm in caliber. IMPRESSION: 1. No acute intracranial abnormality. 2.  No acute displaced facial  fracture. 3. No acute displaced fracture or traumatic listhesis of the cervical spine. 4. Aneurysmal ascending thoracic aorta (4.1 cm). Recommend annual imaging followup by CTA or MRA. This recommendation follows 2010 ACCF/AHA/AATS/ACR/ASA/SCA/SCAI/SIR/STS/SVM Guidelines for the Diagnosis and Management of Patients with Thoracic Aortic Disease. Circulation. 2010; 121: [id number].  Aortic aneurysm NOS (ICD10-I71.9). Electronically Signed   By: Morgane  Naveau M.D.   On: 08/28/2024 00:16   DG Chest Port 1 View Result Date: 08/27/2024 CLINICAL DATA:  Preoperative evaluation EXAM: PORTABLE CHEST 1 VIEW COMPARISON:  06/18/2023 FINDINGS: Cardiac shadow is within normal limits. Tortuous thoracic aorta is noted. Lungs are clear bilaterally. Bilateral shoulder replacements are seen. IMPRESSION: No acute abnormality noted. Electronically Signed   By: Oneil Devonshire M.D.   On: 08/27/2024 22:59   DG Knee 1-2 Views Right Result Date: 08/27/2024 CLINICAL DATA:  Recent fall with right knee pain, initial encounter EXAM: RIGHT KNEE - 2 VIEW COMPARISON:  None Available. FINDINGS: Right knee prosthesis is noted. Immediately superior to the prosthesis there is a transverse fracture through the distal femoral metaphysis. The distal fracture fragment is posteriorly displaced with respect proximal femoral shaft. IMPRESSION: Transverse fracture just above the patient's right knee prosthesis with posterior displacement of the distal femoral fracture. Electronically Signed   By: Oneil Devonshire M.D.   On: 08/27/2024 22:59   DG Hip Unilat W or Wo Pelvis 2-3 Views Right Result Date: 08/27/2024 CLINICAL DATA:  Recent fall with right hip pain, initial encounter EXAM: DG HIP (WITH OR WITHOUT PELVIS) 3V RIGHT COMPARISON:  07/08/2019 FINDINGS: Pelvic ring is intact. Right hip replacement is noted. Right InterStim device is seen. Postsurgical changes in the lower lumbar spine are noted. Mild degenerative changes of the left hip joint are seen. No acute fracture is noted. IMPRESSION: Chronic changes without acute abnormality. Electronically Signed   By: Oneil Devonshire M.D.   On: 08/27/2024 22:53    Time coordinating discharge: 60 mins  SIGNED:  Camellia Door, DO [facility] 09/02/24, 9:29 AM

## 2024-09-02 NOTE — TOC Transition Note (Signed)
 Transition of Care [facility]) - Discharge Note   Patient Details  Name: Kelly Taylor MRN: [id number] Date of Birth: 1951-08-02  [facility]) CM/SW Contact:  Jeoffrey LITTIE [name] Phone Number: 09/02/2024, 10:03 AM   Clinical Narrative:    Patient will DC to: [facility] Anticipated DC date: 09/02/24 Family notified:  Transport by: [facility]   Per MD patient ready for DC to [facility]. RN to call report prior to discharge [phone] room 609B. RN, patient, patient's family, and facility notified of DC. Discharge Summary and FL2 sent to facility. DC packet on chart. Ambulance transport requested for patient.   CSW will sign off for now as social work intervention is no longer needed. Please consult us  again if new needs arise.     Final next level of care: Skilled Nursing Facility Barriers to Discharge: Barriers Resolved   Patient Goals and CMS Choice Patient states their goals for this hospitalization and ongoing recovery are:: SNF          Discharge Placement   Existing PASRR number confirmed : 09/02/24          Patient chooses bed at: [facility] Patient to be transferred to facility by: [facility]   Patient and family notified of of transfer: 09/02/24  Discharge Plan and Services Additional resources added to the After Visit Summary for                                       Social Drivers of Health (SDOH) Interventions SDOH Screenings   Food Insecurity: Food Insecurity Present (08/30/2024)  Housing: Low Risk  (08/30/2024)  Transportation Needs: No Transportation Needs (08/30/2024)  Utilities: Not At Risk (08/30/2024)  Depression (PHQ2-9): Medium Risk (07/18/2022)  Financial Resource Strain: Low Risk  (06/17/2024)   Received from [facility]  Social Connections: Socially Isolated (08/30/2024)  Tobacco Use: Low Risk  (08/28/2024)     Readmission Risk Interventions     No data to display

## 2024-09-02 NOTE — Progress Notes (Signed)
 Patient has been discharged to [facility] facility per MD order. IV has been removed, tolerated well. AVS instructions have been reviewed with patient, verbalized understanding. Report was not able to be given to [facility] d/t not being able to reach anyone on the phone after 3x attempts. [facility] is here to pick patient up.

## 2024-09-04 DIAGNOSIS — F339 Major depressive disorder, recurrent, unspecified: Secondary | ICD-10-CM | POA: Diagnosis not present

## 2024-09-04 DIAGNOSIS — S42411D Displaced simple supracondylar fracture without intercondylar fracture of right humerus, subsequent encounter for fracture with routine healing: Secondary | ICD-10-CM | POA: Diagnosis not present

## 2024-09-04 DIAGNOSIS — E785 Hyperlipidemia, unspecified: Secondary | ICD-10-CM | POA: Diagnosis not present

## 2024-09-04 DIAGNOSIS — G4733 Obstructive sleep apnea (adult) (pediatric): Secondary | ICD-10-CM | POA: Diagnosis not present

## 2024-09-04 DIAGNOSIS — E039 Hypothyroidism, unspecified: Secondary | ICD-10-CM | POA: Diagnosis not present

## 2024-09-04 DIAGNOSIS — E669 Obesity, unspecified: Secondary | ICD-10-CM | POA: Diagnosis not present

## 2024-09-04 DIAGNOSIS — D649 Anemia, unspecified: Secondary | ICD-10-CM | POA: Diagnosis not present

## 2024-09-08 DIAGNOSIS — D649 Anemia, unspecified: Secondary | ICD-10-CM | POA: Diagnosis not present

## 2024-09-08 DIAGNOSIS — E785 Hyperlipidemia, unspecified: Secondary | ICD-10-CM | POA: Diagnosis not present

## 2024-09-08 DIAGNOSIS — F339 Major depressive disorder, recurrent, unspecified: Secondary | ICD-10-CM | POA: Diagnosis not present

## 2024-09-08 DIAGNOSIS — E119 Type 2 diabetes mellitus without complications: Secondary | ICD-10-CM | POA: Diagnosis not present

## 2024-09-08 DIAGNOSIS — E039 Hypothyroidism, unspecified: Secondary | ICD-10-CM | POA: Diagnosis not present

## 2024-09-10 DIAGNOSIS — R2681 Unsteadiness on feet: Secondary | ICD-10-CM | POA: Diagnosis not present

## 2024-09-10 DIAGNOSIS — M25561 Pain in right knee: Secondary | ICD-10-CM | POA: Diagnosis not present

## 2024-09-16 DIAGNOSIS — S72451D Displaced supracondylar fracture without intracondylar extension of lower end of right femur, subsequent encounter for closed fracture with routine healing: Secondary | ICD-10-CM | POA: Diagnosis not present

## 2024-09-20 DIAGNOSIS — B961 Klebsiella pneumoniae [K. pneumoniae] as the cause of diseases classified elsewhere: Secondary | ICD-10-CM | POA: Diagnosis not present

## 2024-09-20 DIAGNOSIS — M48061 Spinal stenosis, lumbar region without neurogenic claudication: Secondary | ICD-10-CM | POA: Diagnosis not present

## 2024-09-20 DIAGNOSIS — S72401D Unspecified fracture of lower end of right femur, subsequent encounter for closed fracture with routine healing: Secondary | ICD-10-CM | POA: Diagnosis not present

## 2024-09-20 DIAGNOSIS — F339 Major depressive disorder, recurrent, unspecified: Secondary | ICD-10-CM | POA: Diagnosis not present

## 2024-09-20 DIAGNOSIS — R2689 Other abnormalities of gait and mobility: Secondary | ICD-10-CM | POA: Diagnosis not present

## 2024-09-20 DIAGNOSIS — J45909 Unspecified asthma, uncomplicated: Secondary | ICD-10-CM | POA: Diagnosis not present

## 2024-09-20 DIAGNOSIS — G8929 Other chronic pain: Secondary | ICD-10-CM | POA: Diagnosis not present

## 2024-09-20 DIAGNOSIS — N3281 Overactive bladder: Secondary | ICD-10-CM | POA: Diagnosis not present

## 2024-09-20 DIAGNOSIS — E039 Hypothyroidism, unspecified: Secondary | ICD-10-CM | POA: Diagnosis not present

## 2024-09-20 DIAGNOSIS — S72451D Displaced supracondylar fracture without intracondylar extension of lower end of right femur, subsequent encounter for closed fracture with routine healing: Secondary | ICD-10-CM | POA: Diagnosis not present

## 2024-09-20 DIAGNOSIS — E785 Hyperlipidemia, unspecified: Secondary | ICD-10-CM | POA: Diagnosis not present

## 2024-09-20 DIAGNOSIS — E119 Type 2 diabetes mellitus without complications: Secondary | ICD-10-CM | POA: Diagnosis not present

## 2024-09-20 DIAGNOSIS — R278 Other lack of coordination: Secondary | ICD-10-CM | POA: Diagnosis not present

## 2024-09-20 DIAGNOSIS — M4312 Spondylolisthesis, cervical region: Secondary | ICD-10-CM | POA: Diagnosis not present

## 2024-09-20 DIAGNOSIS — R42 Dizziness and giddiness: Secondary | ICD-10-CM | POA: Diagnosis not present

## 2024-09-20 DIAGNOSIS — N390 Urinary tract infection, site not specified: Secondary | ICD-10-CM | POA: Diagnosis not present

## 2024-09-20 DIAGNOSIS — M6281 Muscle weakness (generalized): Secondary | ICD-10-CM | POA: Diagnosis not present

## 2024-09-20 DIAGNOSIS — Z6841 Body Mass Index (BMI) 40.0 and over, adult: Secondary | ICD-10-CM | POA: Diagnosis not present

## 2024-09-20 DIAGNOSIS — M9711XD Periprosthetic fracture around internal prosthetic right knee joint, subsequent encounter: Secondary | ICD-10-CM | POA: Diagnosis not present

## 2024-09-20 DIAGNOSIS — F419 Anxiety disorder, unspecified: Secondary | ICD-10-CM | POA: Diagnosis not present

## 2024-09-20 DIAGNOSIS — J9601 Acute respiratory failure with hypoxia: Secondary | ICD-10-CM | POA: Diagnosis not present

## 2024-09-20 DIAGNOSIS — D649 Anemia, unspecified: Secondary | ICD-10-CM | POA: Diagnosis not present

## 2024-09-20 DIAGNOSIS — G4733 Obstructive sleep apnea (adult) (pediatric): Secondary | ICD-10-CM | POA: Diagnosis not present

## 2024-09-20 DIAGNOSIS — K589 Irritable bowel syndrome without diarrhea: Secondary | ICD-10-CM | POA: Diagnosis not present

## 2024-09-20 DIAGNOSIS — W19XXXD Unspecified fall, subsequent encounter: Secondary | ICD-10-CM | POA: Diagnosis not present

## 2024-09-20 DIAGNOSIS — I719 Aortic aneurysm of unspecified site, without rupture: Secondary | ICD-10-CM | POA: Diagnosis not present

## 2024-09-22 DIAGNOSIS — E039 Hypothyroidism, unspecified: Secondary | ICD-10-CM | POA: Diagnosis not present

## 2024-09-22 DIAGNOSIS — D649 Anemia, unspecified: Secondary | ICD-10-CM | POA: Diagnosis not present

## 2024-09-22 DIAGNOSIS — F339 Major depressive disorder, recurrent, unspecified: Secondary | ICD-10-CM | POA: Diagnosis not present

## 2024-09-22 DIAGNOSIS — E785 Hyperlipidemia, unspecified: Secondary | ICD-10-CM | POA: Diagnosis not present

## 2024-10-08 DIAGNOSIS — F419 Anxiety disorder, unspecified: Secondary | ICD-10-CM | POA: Diagnosis not present

## 2024-10-08 DIAGNOSIS — R42 Dizziness and giddiness: Secondary | ICD-10-CM | POA: Diagnosis not present

## 2024-10-14 DIAGNOSIS — S72451D Displaced supracondylar fracture without intracondylar extension of lower end of right femur, subsequent encounter for closed fracture with routine healing: Secondary | ICD-10-CM | POA: Diagnosis not present

## 2024-10-28 DIAGNOSIS — E119 Type 2 diabetes mellitus without complications: Secondary | ICD-10-CM | POA: Diagnosis not present

## 2024-10-28 DIAGNOSIS — S72401D Unspecified fracture of lower end of right femur, subsequent encounter for closed fracture with routine healing: Secondary | ICD-10-CM | POA: Diagnosis not present

## 2024-10-28 DIAGNOSIS — M9711XD Periprosthetic fracture around internal prosthetic right knee joint, subsequent encounter: Secondary | ICD-10-CM | POA: Diagnosis not present

## 2024-10-28 DIAGNOSIS — J45909 Unspecified asthma, uncomplicated: Secondary | ICD-10-CM | POA: Diagnosis not present
